# Patient Record
Sex: Female | Born: 1935
Health system: Southern US, Community
[De-identification: ages and names within clinical notes are randomized; demographics above are authoritative.]

## PROBLEM LIST (undated history)

## (undated) DIAGNOSIS — K5730 Diverticulosis of large intestine without perforation or abscess without bleeding: Secondary | ICD-10-CM

## (undated) DIAGNOSIS — I10 Essential (primary) hypertension: Secondary | ICD-10-CM

## (undated) DIAGNOSIS — B351 Tinea unguium: Secondary | ICD-10-CM

## (undated) DIAGNOSIS — K219 Gastro-esophageal reflux disease without esophagitis: Secondary | ICD-10-CM

## (undated) DIAGNOSIS — D259 Leiomyoma of uterus, unspecified: Secondary | ICD-10-CM

## (undated) DIAGNOSIS — M1990 Unspecified osteoarthritis, unspecified site: Secondary | ICD-10-CM

## (undated) DIAGNOSIS — C50919 Malignant neoplasm of unspecified site of unspecified female breast: Secondary | ICD-10-CM

## (undated) HISTORY — DX: Diverticulosis of large intestine without perforation or abscess without bleeding: K57.30

## (undated) HISTORY — PX: COLONOSCOPY: SHX174

## (undated) HISTORY — PX: ABDOMINAL HYSTERECTOMY: SHX81

## (undated) HISTORY — PX: VAGINAL HYSTERECTOMY: SHX2639

## (undated) HISTORY — DX: Leiomyoma of uterus, unspecified: D25.9

## (undated) HISTORY — DX: Tinea unguium: B35.1

## (undated) HISTORY — PX: CHOLECYSTECTOMY: SHX55

## (undated) HISTORY — DX: Gastro-esophageal reflux disease without esophagitis: K21.9

## (undated) HISTORY — DX: Malignant neoplasm of unspecified site of unspecified female breast: C50.919

---

## 1998-06-14 ENCOUNTER — Emergency Department (HOSPITAL_COMMUNITY): Admission: EM | Admit: 1998-06-14 | Discharge: 1998-06-15 | Payer: Self-pay | Admitting: Emergency Medicine

## 2002-12-07 HISTORY — PX: OTHER SURGICAL HISTORY: SHX169

## 2002-12-07 HISTORY — PX: BREAST LUMPECTOMY: SHX2

## 2003-02-22 ENCOUNTER — Encounter: Admission: RE | Admit: 2003-02-22 | Discharge: 2003-02-22 | Payer: Self-pay | Admitting: Family Medicine

## 2003-02-22 ENCOUNTER — Other Ambulatory Visit: Admission: RE | Admit: 2003-02-22 | Discharge: 2003-02-22 | Payer: Self-pay | Admitting: Radiology

## 2003-02-22 ENCOUNTER — Encounter: Payer: Self-pay | Admitting: Family Medicine

## 2003-03-09 ENCOUNTER — Encounter (HOSPITAL_BASED_OUTPATIENT_CLINIC_OR_DEPARTMENT_OTHER): Payer: Self-pay | Admitting: General Surgery

## 2003-03-13 ENCOUNTER — Ambulatory Visit (HOSPITAL_COMMUNITY): Admission: RE | Admit: 2003-03-13 | Discharge: 2003-03-13 | Payer: Self-pay | Admitting: General Surgery

## 2003-03-13 ENCOUNTER — Encounter (INDEPENDENT_AMBULATORY_CARE_PROVIDER_SITE_OTHER): Payer: Self-pay | Admitting: Specialist

## 2003-03-13 ENCOUNTER — Encounter (HOSPITAL_BASED_OUTPATIENT_CLINIC_OR_DEPARTMENT_OTHER): Payer: Self-pay | Admitting: General Surgery

## 2003-04-05 ENCOUNTER — Ambulatory Visit (HOSPITAL_COMMUNITY): Admission: RE | Admit: 2003-04-05 | Discharge: 2003-04-05 | Payer: Self-pay | Admitting: Oncology

## 2003-04-05 ENCOUNTER — Encounter (HOSPITAL_COMMUNITY): Payer: Self-pay | Admitting: Oncology

## 2003-04-06 ENCOUNTER — Encounter: Payer: Self-pay | Admitting: Cardiovascular Disease

## 2003-04-06 ENCOUNTER — Ambulatory Visit: Admission: RE | Admit: 2003-04-06 | Discharge: 2003-04-06 | Payer: Self-pay | Admitting: Oncology

## 2003-04-16 ENCOUNTER — Ambulatory Visit (HOSPITAL_COMMUNITY): Admission: RE | Admit: 2003-04-16 | Discharge: 2003-04-16 | Payer: Self-pay | Admitting: Oncology

## 2003-04-16 ENCOUNTER — Encounter (HOSPITAL_COMMUNITY): Payer: Self-pay | Admitting: Oncology

## 2003-04-30 ENCOUNTER — Other Ambulatory Visit: Admission: RE | Admit: 2003-04-30 | Discharge: 2003-04-30 | Payer: Self-pay | Admitting: Gynecology

## 2003-06-06 ENCOUNTER — Ambulatory Visit: Admission: RE | Admit: 2003-06-06 | Discharge: 2003-06-06 | Payer: Self-pay | Admitting: Gynecology

## 2003-10-31 ENCOUNTER — Ambulatory Visit: Admission: RE | Admit: 2003-10-31 | Discharge: 2004-01-17 | Payer: Self-pay | Admitting: Radiation Oncology

## 2003-11-08 ENCOUNTER — Encounter: Admission: RE | Admit: 2003-11-08 | Discharge: 2003-11-08 | Payer: Self-pay | Admitting: Radiation Oncology

## 2003-11-19 ENCOUNTER — Ambulatory Visit (HOSPITAL_COMMUNITY): Admission: RE | Admit: 2003-11-19 | Discharge: 2003-11-19 | Payer: Self-pay | Admitting: Gynecology

## 2003-11-19 ENCOUNTER — Other Ambulatory Visit: Admission: RE | Admit: 2003-11-19 | Discharge: 2003-11-19 | Payer: Self-pay | Admitting: Gynecology

## 2003-11-27 ENCOUNTER — Ambulatory Visit (HOSPITAL_COMMUNITY): Admission: RE | Admit: 2003-11-27 | Discharge: 2003-11-27 | Payer: Self-pay | Admitting: Oncology

## 2003-12-26 ENCOUNTER — Ambulatory Visit (HOSPITAL_COMMUNITY): Admission: RE | Admit: 2003-12-26 | Discharge: 2003-12-26 | Payer: Self-pay | Admitting: Oncology

## 2004-02-04 ENCOUNTER — Ambulatory Visit (HOSPITAL_COMMUNITY): Admission: RE | Admit: 2004-02-04 | Discharge: 2004-02-04 | Payer: Self-pay | Admitting: Oncology

## 2004-02-07 ENCOUNTER — Ambulatory Visit: Admission: RE | Admit: 2004-02-07 | Discharge: 2004-02-07 | Payer: Self-pay | Admitting: Radiation Oncology

## 2004-02-21 ENCOUNTER — Ambulatory Visit: Admission: RE | Admit: 2004-02-21 | Discharge: 2004-02-21 | Payer: Self-pay | Admitting: Radiation Oncology

## 2004-02-25 ENCOUNTER — Encounter: Admission: RE | Admit: 2004-02-25 | Discharge: 2004-02-25 | Payer: Self-pay | Admitting: Oncology

## 2004-08-04 ENCOUNTER — Other Ambulatory Visit: Admission: RE | Admit: 2004-08-04 | Discharge: 2004-08-04 | Payer: Self-pay | Admitting: Gynecology

## 2004-08-28 ENCOUNTER — Ambulatory Visit: Admission: RE | Admit: 2004-08-28 | Discharge: 2004-08-28 | Payer: Self-pay | Admitting: Radiation Oncology

## 2004-10-20 ENCOUNTER — Ambulatory Visit: Payer: Self-pay | Admitting: Oncology

## 2004-10-21 ENCOUNTER — Ambulatory Visit (HOSPITAL_COMMUNITY): Admission: RE | Admit: 2004-10-21 | Discharge: 2004-10-21 | Payer: Self-pay | Admitting: Oncology

## 2005-01-19 ENCOUNTER — Ambulatory Visit: Payer: Self-pay | Admitting: Oncology

## 2005-02-10 ENCOUNTER — Ambulatory Visit (HOSPITAL_COMMUNITY): Admission: RE | Admit: 2005-02-10 | Discharge: 2005-02-10 | Payer: Self-pay | Admitting: Gynecology

## 2005-04-17 ENCOUNTER — Ambulatory Visit: Payer: Self-pay | Admitting: Oncology

## 2005-04-23 ENCOUNTER — Encounter: Admission: RE | Admit: 2005-04-23 | Discharge: 2005-04-23 | Payer: Self-pay | Admitting: Oncology

## 2005-08-03 ENCOUNTER — Ambulatory Visit: Payer: Self-pay | Admitting: Oncology

## 2005-08-13 ENCOUNTER — Ambulatory Visit: Payer: Self-pay | Admitting: Internal Medicine

## 2005-08-24 ENCOUNTER — Ambulatory Visit (HOSPITAL_COMMUNITY): Admission: RE | Admit: 2005-08-24 | Discharge: 2005-08-24 | Payer: Self-pay | Admitting: Internal Medicine

## 2005-08-24 ENCOUNTER — Ambulatory Visit: Payer: Self-pay | Admitting: Internal Medicine

## 2005-11-03 ENCOUNTER — Ambulatory Visit (HOSPITAL_COMMUNITY): Admission: RE | Admit: 2005-11-03 | Discharge: 2005-11-03 | Payer: Self-pay | Admitting: Oncology

## 2005-11-03 ENCOUNTER — Ambulatory Visit: Payer: Self-pay | Admitting: Oncology

## 2005-11-19 ENCOUNTER — Encounter: Admission: RE | Admit: 2005-11-19 | Discharge: 2005-11-19 | Payer: Self-pay | Admitting: Oncology

## 2006-02-03 ENCOUNTER — Ambulatory Visit: Payer: Self-pay | Admitting: Oncology

## 2006-05-01 ENCOUNTER — Ambulatory Visit: Payer: Self-pay | Admitting: Oncology

## 2006-05-06 LAB — COMPREHENSIVE METABOLIC PANEL WITH GFR
ALT: <8 U/L (ref 0–40)
AST: 11 U/L (ref 0–37)
Albumin: 3.9 g/dL (ref 3.5–5.2)
Alkaline Phosphatase: 77 U/L (ref 39–117)
BUN: 14 mg/dL (ref 6–23)
CO2: 24 meq/L (ref 19–32)
Calcium: 9.4 mg/dL (ref 8.4–10.5)
Chloride: 106 meq/L (ref 96–112)
Creatinine, Ser: 1 mg/dL (ref 0.4–1.2)
Glucose, Bld: 93 mg/dL (ref 70–99)
Potassium: 4.3 meq/L (ref 3.5–5.3)
Sodium: 140 meq/L (ref 135–145)
Total Bilirubin: 0.4 mg/dL (ref 0.3–1.2)
Total Protein: 7.2 g/dL (ref 6.0–8.3)

## 2006-05-06 LAB — CBC WITH DIFFERENTIAL/PLATELET
BASO%: 0.2 % (ref 0.0–2.0)
Basophils Absolute: 0 10e3/uL (ref 0.0–0.1)
EOS%: 2.5 % (ref 0.0–7.0)
Eosinophils Absolute: 0.2 10e3/uL (ref 0.0–0.5)
HCT: 42.2 % (ref 34.8–46.6)
HGB: 14.1 g/dL (ref 11.6–15.9)
LYMPH%: 29.8 % (ref 14.0–48.0)
MCH: 28.3 pg (ref 26.0–34.0)
MCHC: 33.3 g/dL (ref 32.0–36.0)
MCV: 84.8 fL (ref 81.0–101.0)
MONO#: 0.6 10e3/uL (ref 0.1–0.9)
MONO%: 9.3 % (ref 0.0–13.0)
NEUT#: 3.9 10e3/uL (ref 1.5–6.5)
NEUT%: 58.2 % (ref 39.6–76.8)
Platelets: 181 10e3/uL (ref 145–400)
RBC: 4.97 10e6/uL (ref 3.70–5.32)
RDW: 13.6 % (ref 11.3–14.5)
WBC: 6.8 10e3/uL (ref 3.9–10.0)
lymph#: 2 10e3/uL (ref 0.9–3.3)

## 2006-05-06 LAB — LACTATE DEHYDROGENASE: LDH: 154 U/L (ref 94–250)

## 2006-05-06 LAB — TSH: TSH: 0.958 u[IU]/mL (ref 0.350–5.500)

## 2006-05-20 ENCOUNTER — Encounter: Admission: RE | Admit: 2006-05-20 | Discharge: 2006-05-20 | Payer: Self-pay | Admitting: Oncology

## 2006-08-03 ENCOUNTER — Ambulatory Visit: Payer: Self-pay | Admitting: Oncology

## 2006-08-05 LAB — COMPREHENSIVE METABOLIC PANEL
Albumin: 4.4 g/dL (ref 3.5–5.2)
Alkaline Phosphatase: 91 U/L (ref 39–117)
CO2: 22 mEq/L (ref 19–32)
Chloride: 104 mEq/L (ref 96–112)
Glucose, Bld: 98 mg/dL (ref 70–99)
Potassium: 4.5 mEq/L (ref 3.5–5.3)
Sodium: 137 mEq/L (ref 135–145)
Total Protein: 8 g/dL (ref 6.0–8.3)

## 2006-08-05 LAB — CBC WITH DIFFERENTIAL/PLATELET
Eosinophils Absolute: 0.1 10*3/uL (ref 0.0–0.5)
MONO#: 0.6 10*3/uL (ref 0.1–0.9)
MONO%: 7.4 % (ref 0.0–13.0)
NEUT#: 5.5 10*3/uL (ref 1.5–6.5)
RBC: 5.4 10*6/uL — ABNORMAL HIGH (ref 3.70–5.32)
RDW: 13.2 % (ref 11.3–14.5)
WBC: 8.6 10*3/uL (ref 3.9–10.0)
lymph#: 2.3 10*3/uL (ref 0.9–3.3)

## 2006-08-05 LAB — LACTATE DEHYDROGENASE: LDH: 139 U/L (ref 94–250)

## 2006-08-12 ENCOUNTER — Emergency Department (HOSPITAL_COMMUNITY): Admission: EM | Admit: 2006-08-12 | Discharge: 2006-08-12 | Payer: Self-pay | Admitting: Emergency Medicine

## 2006-11-02 ENCOUNTER — Ambulatory Visit: Payer: Self-pay | Admitting: Oncology

## 2006-11-04 ENCOUNTER — Ambulatory Visit (HOSPITAL_COMMUNITY): Admission: RE | Admit: 2006-11-04 | Discharge: 2006-11-04 | Payer: Self-pay | Admitting: Oncology

## 2006-11-04 LAB — CBC WITH DIFFERENTIAL/PLATELET
Basophils Absolute: 0 10*3/uL (ref 0.0–0.1)
Eosinophils Absolute: 0.1 10*3/uL (ref 0.0–0.5)
HCT: 41 % (ref 34.8–46.6)
HGB: 13.6 g/dL (ref 11.6–15.9)
MCV: 85.6 fL (ref 81.0–101.0)
MONO%: 7 % (ref 0.0–13.0)
NEUT#: 3.7 10*3/uL (ref 1.5–6.5)
NEUT%: 60.8 % (ref 39.6–76.8)
RDW: 14.4 % (ref 11.3–14.5)
lymph#: 1.8 10*3/uL (ref 0.9–3.3)

## 2006-11-04 LAB — COMPREHENSIVE METABOLIC PANEL
Albumin: 3.9 g/dL (ref 3.5–5.2)
BUN: 17 mg/dL (ref 6–23)
Calcium: 9 mg/dL (ref 8.4–10.5)
Chloride: 109 mEq/L (ref 96–112)
Creatinine, Ser: 0.9 mg/dL (ref 0.40–1.20)
Glucose, Bld: 87 mg/dL (ref 70–99)
Potassium: 4.3 mEq/L (ref 3.5–5.3)

## 2006-11-04 LAB — LACTATE DEHYDROGENASE: LDH: 141 U/L (ref 94–250)

## 2007-01-31 ENCOUNTER — Ambulatory Visit: Payer: Self-pay | Admitting: Oncology

## 2007-04-21 ENCOUNTER — Emergency Department (HOSPITAL_COMMUNITY): Admission: EM | Admit: 2007-04-21 | Discharge: 2007-04-21 | Payer: Self-pay | Admitting: Emergency Medicine

## 2007-05-11 ENCOUNTER — Ambulatory Visit: Payer: Self-pay | Admitting: Oncology

## 2007-05-13 LAB — CBC WITH DIFFERENTIAL/PLATELET
BASO%: 0.7 % (ref 0.0–2.0)
Eosinophils Absolute: 0.2 10*3/uL (ref 0.0–0.5)
HCT: 41 % (ref 34.8–46.6)
HGB: 14 g/dL (ref 11.6–15.9)
MCH: 28.3 pg (ref 26.0–34.0)
MCV: 82.9 fL (ref 81.0–101.0)
MONO#: 0.6 10*3/uL (ref 0.1–0.9)
Platelets: 218 10*3/uL (ref 145–400)
RDW: 13.7 % (ref 11.3–14.5)

## 2007-05-13 LAB — COMPREHENSIVE METABOLIC PANEL
ALT: <8 U/L (ref 0–35)
AST: 12 U/L (ref 0–37)
Albumin: 3.9 g/dL (ref 3.5–5.2)
Alkaline Phosphatase: 110 U/L (ref 39–117)
BUN: 12 mg/dL (ref 6–23)
CO2: 23 mEq/L (ref 19–32)
Calcium: 9.4 mg/dL (ref 8.4–10.5)
Chloride: 108 mEq/L (ref 96–112)
Creatinine, Ser: 0.89 mg/dL (ref 0.40–1.20)
Glucose, Bld: 88 mg/dL (ref 70–99)
Potassium: 4.1 mEq/L (ref 3.5–5.3)
Sodium: 141 mEq/L (ref 135–145)
Total Bilirubin: 0.4 mg/dL (ref 0.3–1.2)
Total Protein: 7.3 g/dL (ref 6.0–8.3)

## 2007-05-27 ENCOUNTER — Encounter: Admission: RE | Admit: 2007-05-27 | Discharge: 2007-05-27 | Payer: Self-pay | Admitting: Family Medicine

## 2007-06-03 ENCOUNTER — Encounter: Admission: RE | Admit: 2007-06-03 | Discharge: 2007-06-03 | Payer: Self-pay | Admitting: Oncology

## 2007-09-07 ENCOUNTER — Ambulatory Visit: Payer: Self-pay | Admitting: Oncology

## 2007-09-26 LAB — COMPREHENSIVE METABOLIC PANEL
AST: 10 U/L (ref 0–37)
Albumin: 4 g/dL (ref 3.5–5.2)
Alkaline Phosphatase: 76 U/L (ref 39–117)
BUN: 14 mg/dL (ref 6–23)
Potassium: 4 mEq/L (ref 3.5–5.3)
Total Bilirubin: 0.4 mg/dL (ref 0.3–1.2)

## 2007-09-26 LAB — CBC WITH DIFFERENTIAL/PLATELET
Basophils Absolute: 0.1 10*3/uL (ref 0.0–0.1)
EOS%: 1.7 % (ref 0.0–7.0)
HGB: 14 g/dL (ref 11.6–15.9)
LYMPH%: 33.1 % (ref 14.0–48.0)
MCH: 28.2 pg (ref 26.0–34.0)
MCV: 82.3 fL (ref 81.0–101.0)
MONO%: 7.1 % (ref 0.0–13.0)
Platelets: 176 10*3/uL (ref 145–400)
RBC: 4.97 10*6/uL (ref 3.70–5.32)
RDW: 14 % (ref 11.3–14.5)

## 2008-01-25 ENCOUNTER — Ambulatory Visit: Payer: Self-pay | Admitting: Oncology

## 2008-01-30 ENCOUNTER — Emergency Department (HOSPITAL_COMMUNITY): Admission: EM | Admit: 2008-01-30 | Discharge: 2008-01-31 | Payer: Self-pay | Admitting: Family Medicine

## 2008-02-10 ENCOUNTER — Ambulatory Visit (HOSPITAL_COMMUNITY): Admission: RE | Admit: 2008-02-10 | Discharge: 2008-02-10 | Payer: Self-pay | Admitting: Gynecology

## 2008-02-20 ENCOUNTER — Ambulatory Visit: Payer: Self-pay | Admitting: Gastroenterology

## 2008-04-23 DIAGNOSIS — C50919 Malignant neoplasm of unspecified site of unspecified female breast: Secondary | ICD-10-CM | POA: Insufficient documentation

## 2008-04-23 DIAGNOSIS — K5909 Other constipation: Secondary | ICD-10-CM

## 2008-04-23 DIAGNOSIS — K5730 Diverticulosis of large intestine without perforation or abscess without bleeding: Secondary | ICD-10-CM | POA: Insufficient documentation

## 2008-04-23 DIAGNOSIS — D259 Leiomyoma of uterus, unspecified: Secondary | ICD-10-CM | POA: Insufficient documentation

## 2008-04-23 DIAGNOSIS — I10 Essential (primary) hypertension: Secondary | ICD-10-CM

## 2008-04-23 HISTORY — DX: Essential (primary) hypertension: I10

## 2008-04-23 HISTORY — DX: Malignant neoplasm of unspecified site of unspecified female breast: C50.919

## 2008-04-23 HISTORY — DX: Other constipation: K59.09

## 2008-04-24 ENCOUNTER — Ambulatory Visit: Payer: Self-pay | Admitting: Internal Medicine

## 2008-04-25 ENCOUNTER — Ambulatory Visit: Payer: Self-pay | Admitting: Oncology

## 2008-04-27 ENCOUNTER — Ambulatory Visit (HOSPITAL_COMMUNITY): Admission: RE | Admit: 2008-04-27 | Discharge: 2008-04-27 | Payer: Self-pay | Admitting: Oncology

## 2008-05-31 ENCOUNTER — Encounter: Admission: RE | Admit: 2008-05-31 | Discharge: 2008-05-31 | Payer: Self-pay | Admitting: Oncology

## 2008-10-25 ENCOUNTER — Ambulatory Visit: Payer: Self-pay | Admitting: Oncology

## 2008-10-29 LAB — COMPREHENSIVE METABOLIC PANEL
Albumin: 3.9 g/dL (ref 3.5–5.2)
CO2: 22 mEq/L (ref 19–32)
Glucose, Bld: 99 mg/dL (ref 70–99)
Sodium: 143 mEq/L (ref 135–145)
Total Bilirubin: 0.4 mg/dL (ref 0.3–1.2)
Total Protein: 7.3 g/dL (ref 6.0–8.3)

## 2008-10-29 LAB — CBC WITH DIFFERENTIAL/PLATELET
Eosinophils Absolute: 0.2 10*3/uL (ref 0.0–0.5)
HCT: 44.3 % (ref 34.8–46.6)
HGB: 14.8 g/dL (ref 11.6–15.9)
LYMPH%: 30.7 % (ref 14.0–48.0)
MONO#: 0.6 10*3/uL (ref 0.1–0.9)
NEUT#: 4.4 10*3/uL (ref 1.5–6.5)
NEUT%: 58.1 % (ref 39.6–76.8)
Platelets: 174 10*3/uL (ref 145–400)
RBC: 5.37 10*6/uL — ABNORMAL HIGH (ref 3.70–5.32)
WBC: 7.6 10*3/uL (ref 3.9–10.0)

## 2008-10-29 LAB — LACTATE DEHYDROGENASE: LDH: 209 U/L (ref 94–250)

## 2009-05-23 ENCOUNTER — Ambulatory Visit: Payer: Self-pay | Admitting: Oncology

## 2009-05-27 ENCOUNTER — Ambulatory Visit (HOSPITAL_COMMUNITY): Admission: RE | Admit: 2009-05-27 | Discharge: 2009-05-27 | Payer: Self-pay | Admitting: Oncology

## 2009-05-27 LAB — CBC WITH DIFFERENTIAL/PLATELET
Basophils Absolute: 0 10*3/uL (ref 0.0–0.1)
EOS%: 1.8 % (ref 0.0–7.0)
HCT: 43.8 % (ref 34.8–46.6)
HGB: 14.8 g/dL (ref 11.6–15.9)
MCH: 28.1 pg (ref 25.1–34.0)
MCHC: 33.8 g/dL (ref 31.5–36.0)
MCV: 83.1 fL (ref 79.5–101.0)
MONO%: 8.6 % (ref 0.0–14.0)
NEUT%: 49.3 % (ref 38.4–76.8)

## 2009-05-27 LAB — COMPREHENSIVE METABOLIC PANEL
Alkaline Phosphatase: 109 U/L (ref 39–117)
BUN: 16 mg/dL (ref 6–23)
Creatinine, Ser: 0.96 mg/dL (ref 0.40–1.20)
Glucose, Bld: 84 mg/dL (ref 70–99)
Total Bilirubin: 0.3 mg/dL (ref 0.3–1.2)

## 2009-06-03 ENCOUNTER — Encounter: Admission: RE | Admit: 2009-06-03 | Discharge: 2009-06-03 | Payer: Self-pay | Admitting: Oncology

## 2009-06-04 ENCOUNTER — Encounter: Admission: RE | Admit: 2009-06-04 | Discharge: 2009-06-04 | Payer: Self-pay | Admitting: Oncology

## 2009-10-17 ENCOUNTER — Ambulatory Visit: Payer: Self-pay | Admitting: Oncology

## 2009-10-21 LAB — CBC WITH DIFFERENTIAL/PLATELET
BASO%: 0.4 % (ref 0.0–2.0)
Eosinophils Absolute: 0.2 10*3/uL (ref 0.0–0.5)
HCT: 46.4 % (ref 34.8–46.6)
MCHC: 33 g/dL (ref 31.5–36.0)
MCV: 84.8 fL (ref 79.5–101.0)
MONO%: 7.2 % (ref 0.0–14.0)
RBC: 5.47 10*6/uL — ABNORMAL HIGH (ref 3.70–5.45)
lymph#: 2.8 10*3/uL (ref 0.9–3.3)

## 2009-10-21 LAB — COMPREHENSIVE METABOLIC PANEL
ALT: <8 U/L (ref 0–35)
AST: 11 U/L (ref 0–37)
CO2: 25 mEq/L (ref 19–32)
Chloride: 104 mEq/L (ref 96–112)
Creatinine, Ser: 0.89 mg/dL (ref 0.40–1.20)
Potassium: 4.1 mEq/L (ref 3.5–5.3)
Total Bilirubin: 0.4 mg/dL (ref 0.3–1.2)
Total Protein: 7.4 g/dL (ref 6.0–8.3)

## 2010-04-18 ENCOUNTER — Ambulatory Visit: Payer: Self-pay | Admitting: Oncology

## 2010-04-21 ENCOUNTER — Ambulatory Visit (HOSPITAL_COMMUNITY): Admission: RE | Admit: 2010-04-21 | Discharge: 2010-04-21 | Payer: Self-pay | Admitting: Oncology

## 2010-04-21 LAB — CBC WITH DIFFERENTIAL/PLATELET
EOS%: 1.8 % (ref 0.0–7.0)
HCT: 44 % (ref 34.8–46.6)
LYMPH%: 24.6 % (ref 14.0–49.7)
MCHC: 33.7 g/dL (ref 31.5–36.0)
MONO%: 6.8 % (ref 0.0–14.0)
NEUT#: 5.7 10*3/uL (ref 1.5–6.5)
NEUT%: 66.7 % (ref 38.4–76.8)
RBC: 5.2 10*6/uL (ref 3.70–5.45)
RDW: 13.8 % (ref 11.2–14.5)
WBC: 8.6 10*3/uL (ref 3.9–10.3)
lymph#: 2.1 10*3/uL (ref 0.9–3.3)

## 2010-04-21 LAB — COMPREHENSIVE METABOLIC PANEL
Albumin: 4.1 g/dL (ref 3.5–5.2)
Chloride: 106 mEq/L (ref 96–112)
Creatinine, Ser: 1.03 mg/dL (ref 0.40–1.20)
Total Protein: 7.4 g/dL (ref 6.0–8.3)

## 2010-04-21 LAB — LACTATE DEHYDROGENASE: LDH: 147 U/L (ref 94–250)

## 2010-06-18 ENCOUNTER — Encounter: Admission: RE | Admit: 2010-06-18 | Discharge: 2010-06-18 | Payer: Self-pay | Admitting: Oncology

## 2010-10-17 ENCOUNTER — Ambulatory Visit: Payer: Self-pay | Admitting: Oncology

## 2010-12-27 ENCOUNTER — Encounter (HOSPITAL_COMMUNITY): Payer: Self-pay | Admitting: Oncology

## 2011-04-21 NOTE — Assessment & Plan Note (Signed)
[facility]                         GASTROENTEROLOGY OFFICE NOTE   NAME:[name], [name]                       MRN:          [id number]  DATE:02/20/2008                            DOB:          Apr 12, 1936    Office GI consult.   PROBLEM:  Upper abdominal pain and constipation.   HISTORY:  Linda [name] is a pleasant 75 year old African American female known  to Dr. Juanda Chance from screening colonoscopy done in September 2006.  On  this exam, she was found to have left sided diverticulosis and otherwise  a normal colon.  The patient has a history of breast cancer diagnosed in  2005.  She has had a remote cholecystectomy and has a history of  hypertension.  She relates that she has had problems with constipation  over the past couple of years.  She had initially been taking stool  softeners but was requiring 4 per day.  She said this worked for a while  and then eventually just stopped helping.  Over the past month or so,  she has had increased difficulty with constipation and says when she  gets very constipated she starts having abdominal pain located in the  upper abdomen.  On further questioning, she is having bowel movements  fairly regularly but is passing small pellet like stools and feels that  she has incomplete evacuation.  She feels bloated and uncomfortable.  She is not having any severe abdominal pain.  No nausea or vomiting.  Her weight has been stable.   The patient did have a workup via the emergency room on January 30, 2008.  She did have a CT scan of the abdomen and pelvis which did not  show any acute findings.  She did have a tiny fat-containing umbilical  hernia and a 7.2-cm right adnexal cyst, otherwise negative exam.  She  has since had transvaginal ultrasound and the cyst is being evaluated by  gynecology.   CURRENT MEDICATIONS:  1. Tarka 4.24 daily.  2. Protonix 40 mg daily.  3. Triamterene/HCTZ 37.5/25 daily.  4. Arimidex 1 mg  daily.  5. Metoprolol 50 daily.   ALLERGIES:  No known drug allergies.   PAST HISTORY:  1. Again, pertinent for breast cancer diagnosed in 2005.  2. Remote cholecystectomy.  3. Hypertension.  4. Diverticulosis.  5. Uterine fibroids.   SOCIAL HISTORY:  The patient is single.  She does have two children.  She is a smoker, one pack per day.  No regular ETOH.   FAMILY HISTORY:  Negative for GI disease.   REVIEW OF SYSTEMS:  Completely negative other than GI as outlined above.   PHYSICAL EXAMINATION:  GENERAL:  A well developed African American  female in no acute distress.  VITAL SIGNS:  Weight is 169, height 5 feet 3 inches, blood pressure  118/60, pulse in the 70s.  HEENT:  Nontraumatic, normocephalic.  EOMI.  PERRLA.  Sclerae are  anicteric.  NECK:  Supple without nodes.  CARDIOVASCULAR:  Regular rate and rhythm with S1 and S2.  No murmur,  rub, or gallop.  PULMONARY:  Clear to A&P.  ABDOMEN:  Soft and basically nontender.  There is no palpable mass or  hepatosplenomegaly.  No guarding or rebound.  Bowel sounds are active.  RECTAL:  Hemoccult negative and without mass.   IMPRESSION:  5. A 75 year old female with chronic constipation with exacerbation      and associated abdominal pain.  2. History of breast cancer.  3. Uterine fibroids and large right adnexal cystic lesion.  4. Status post cholecystectomy.   PLAN:  1. Liberalize fluid intake.  2. MiraLax 17 G daily in 8 ounces of water.  3. Followup in one month or sooner p.r.n.      Mike Gip, PA-C  Electronically Signed      Hedwig Morton. Juanda Chance, MD  Electronically Signed   AE/MedQ  DD: 02/20/2008  DT: 02/20/2008  Job #: [id number]

## 2011-04-24 NOTE — Op Note (Signed)
NAME:  Linda [name], Linda [name]                            ACCOUNT NO.:  [id number]   MEDICAL RECORD NO.:  [id number]                   PATIENT TYPE:  OIB   LOCATION:  2550                                 FACILITY:  [facility]   PHYSICIAN:  Leonie Man, M.D.                DATE OF BIRTH:  September 25, 1936   DATE OF PROCEDURE:  03/13/2003  DATE OF DISCHARGE:  03/13/2003                                 OPERATIVE REPORT   PREOPERATIVE DIAGNOSIS:  Carcinoma right breast.   POSTOPERATIVE DIAGNOSIS:  Carcinoma right breast.   PROCEDURE:  Right lumpectomy with right sentinel lymph node dissection.   SURGEON:  Leonie Man, M.D.   ASSISTANT:  Magnus Ivan, R.N.F.A.   ANESTHESIA:  General.   INDICATIONS FOR PROCEDURE:  This patient is a 75 year old woman who is noted  to have a right-sided breast mass and subsequent mammogram showing a  spiculated lesion, which on core biopsy shows intraductal carcinoma. Further  evaluation shows this to be a ER positive lesion. The patient comes to the  operating room now for a lumpectomy with sentinel node dissection, possible  axillary lymph node dissection. She was aware of all the risks and potential  benefits of the surgery.   DESCRIPTION OF PROCEDURE:  Following the induction of satisfactory general  anesthesia the patient was  position supinely. The right breast was prepped  and draped to be included in the sterile operative field. It should be noted  that the patient was injected with both Lymphazurin and technetium sulfur  colloid prior to surgery for localization purposes.   The breast mass which was palpable in the 9 to 10 o'clock axis of the right  breast was approached. An elliptical incision was deepened through the skin  and subcutaneous tissues raising flaps superiorly and inferiorly and  carrying the dissection up and around the mass, then carrying the dissection  down to the chest wall. The mass was removed in its entirety for pathologic  evaluation.   Touch prep for margin on this specimen showed some atypia at the 3 o'clock  and the 12 o'clock margins. Additional margins were taken from these areas  and forwarded for pathologic evaluation and pathology is pending.   Attention was then turned to the axilla, where the Neoprobe was used to  localize a spot of high radioactive concentrate in the axilla. A transverse  axillary incision was made in that area and dissection was carried down to  the area of the lymph node using the Neoprobe to guide the direction of the  dissection. A large sentinel lymph node was found along with 2 additional  smaller nodes which were highly radioactive and stained blue. These were  removed in their entirety for further pathologic evaluation. All of the  nodes were noted to be negative for carcinoma on touch prep.   All areas of dissection were then checked for hemostasis and noted to  be  dry. The breast wound was closed in 2 layers using 3-0 Vicryl and a 4-0  Monocryl suture and the axillary wound using a 2-0 Vicryl and a 4-0 Monocryl  subcuticular suture. The wound was then reinforced with Steri-Strips and dry  sterile dressings were applied.   The anesthetic was reversed and the patient was removed from the operating  room to the recovery room in stable condition. She tolerated the procedure  well.                                               Leonie Man, M.D.    PB/MEDQ  D:  03/13/2003  T:  03/14/2003  Job:  [id number]   cc:   Lorelle Formosa, M.D.  [phone] [address]  [place]  [place] [address]  Fax: [phone]

## 2011-04-24 NOTE — Consult Note (Signed)
NAME:  Linda [name], CLOVER NO.:  [id number]   MEDICAL RECORD NO.:  [id number]                   PATIENT TYPE:  OUT   LOCATION:  GYN                                  FACILITY:  [facility]   PHYSICIAN:  De Blanch, M.D.         DATE OF BIRTH:  [birth year]   DATE OF CONSULTATION:  06/06/2003  DATE OF DISCHARGE:                                   CONSULTATION   REASON FOR CONSULTATION:  The patient is a 75 year old African-American  female seen in consultation at the request of Dr. Teodora Medici.   The patient has been recently diagnosed with a stage II breast cancer, and  is under the care of Dr. Johney Frame.  As part of her staging workup, she  had a CAT scan which showed a pelvic mass.  This was further typified with  transabdominal and transvaginal ultrasound showing the right ovary to  contain a large cyst measuring 5.7 x 3.9 x 6.2 cm.  The cyst is well defined  without evidence of calcifications or septations, and there is no free  fluid.  The patient is also found to have moderate sized uterine fibroids  measuring 7.8 x 7.4 x 7.1 cm.  The largest fibroid in the fundus and uterus  measures 4.4 x 5.2 x 5.2 cm.  The endometrial stripe is only 3.6 mm.   The patient denies any gynecologic symptoms.  She specifically denies any  pelvic pressure, pain, GI, or GU symptoms.  She has no significant past  gynecologic history, and was unaware that she had fibroids.  CA-125 has been  obtained which is 5.1 units/mL.   PAST MEDICAL HISTORY:  1. Breast cancer in 2004.  The patient is currently receiving multi-agent     chemotherapy.  2. Hypertension.  3. Goiter.  4. Gastroesophageal reflux disease.   PAST SURGICAL HISTORY:  Cholecystectomy.   OBSTETRICAL HISTORY:  Gravida 2.   ALLERGIES:  No known drug allergies.   CURRENT MEDICATIONS:  1. Triamterene/hydrochlorothiazide.  2. Tarka.  3. Multivitamins.  4. Aspirin.  5. Nexium.   FAMILY HISTORY:   Negative for gynecologic or breast cancer.  The patient's  grandmother has colon cancer.   REVIEW OF SYSTEMS:  Essentially negative, except as noted above.  She is  recovering from her second cycle of chemotherapy with minimal nausea and  fatigue.   PHYSICAL EXAMINATION:  VITAL SIGNS:  Weight 166 pounds, height 5 feet 3  inches, blood pressure 160/84, pulse 96, respiratory rate 24.  GENERAL:  The patient is a healthy African-American female in no acute  distress.  HEENT:  Alopecia.  NECK:  Supple without thyromegaly.  There is no supraclavicular or inguinal  adenopathy.  ABDOMEN:  Soft, nontender, no mass, organomegaly, ascites, or hernias are  noted.  PELVIC:  EGBUS, vagina, bladder, and urethra are normal, but atrophic.  Cervix is normal.  Uterus is irregular in size, approximately eight weeks.  This is consistent with uterine fibroids.  I do not feel the adnexal mass  noted on ultrasound.  Rectovaginal examination confirms.  EXTREMITIES:  Lower extremities are without edema or varicosities.   IMPRESSION:  Simple ovarian cyst with normal CA-125.   I had a good discussion with the patient and her daughter regarding the  differential diagnoses.  Given the ultrasound architectural characteristics,  I believe this is most likely a benign cyst, and is likely to be of no  consequent.  The outside chance that it would change, I suggest that she  have a repeat ultrasound done in the same unit where she had previously had  an ultrasound [facility]) in approximately four months.  Provided this does not change in characteristics, I would suggest that we  just follow it at six month intervals for a year and then annually.  We will  return the patient to the care of Dr. Chevis Pretty for continuing gynecologic  followup, but I would be happy to see her if there is any worrisome changes.                                               De Blanch, M.D.    DC/MEDQ  D:   06/06/2003  T:  06/06/2003  Job:  [id number]   cc:   Samul Dada, M.D.  [address] [facility]  [place]  [place] [address]  Fax: [phone]   Leatha Gilding. Mezer, M.D.  [address]  [place]  [place] [address]  Fax: [phone]   Telford Nab, R.N.  [phone] [address]  [place], [place] [address]

## 2011-06-15 ENCOUNTER — Other Ambulatory Visit (HOSPITAL_COMMUNITY): Payer: Self-pay | Admitting: Oncology

## 2011-06-15 DIAGNOSIS — Z853 Personal history of malignant neoplasm of breast: Secondary | ICD-10-CM

## 2011-06-15 DIAGNOSIS — Z9889 Other specified postprocedural states: Secondary | ICD-10-CM

## 2011-06-18 ENCOUNTER — Ambulatory Visit
Admission: RE | Admit: 2011-06-18 | Discharge: 2011-06-18 | Disposition: A | Discharge status: Home / Self Care | Payer: PRIVATE HEALTH INSURANCE | Source: Ambulatory Visit | Attending: Oncology | Admitting: Oncology

## 2011-06-18 ENCOUNTER — Other Ambulatory Visit (HOSPITAL_COMMUNITY): Payer: Self-pay | Admitting: Oncology

## 2011-06-18 DIAGNOSIS — Z9889 Other specified postprocedural states: Secondary | ICD-10-CM

## 2011-06-18 DIAGNOSIS — Z853 Personal history of malignant neoplasm of breast: Secondary | ICD-10-CM

## 2011-08-28 LAB — URINE CULTURE
Colony Count: NO GROWTH
Culture: NO GROWTH

## 2011-08-28 LAB — BASIC METABOLIC PANEL
BUN: 14
CO2: 26
Calcium: 10
Chloride: 105
Creatinine, Ser: 1.01
GFR calc Af Amer: >60
GFR calc non Af Amer: 54 — ABNORMAL LOW
Glucose, Bld: 83
Potassium: 4.6
Sodium: 140

## 2011-08-28 LAB — URINALYSIS, ROUTINE W REFLEX MICROSCOPIC
Glucose, UA: NEGATIVE
Hgb urine dipstick: NEGATIVE
Ketones, ur: 15 — AB
Nitrite: NEGATIVE
Protein, ur: 100 — AB
Specific Gravity, Urine: 1.025
Urobilinogen, UA: 1
pH: 5

## 2011-08-28 LAB — POCT URINALYSIS DIP (DEVICE)
Ketones, ur: 15 — AB
Protein, ur: 100 — AB
Specific Gravity, Urine: >=1.03
pH: 5.5

## 2011-08-28 LAB — CBC
HCT: 47.1 — ABNORMAL HIGH
Hemoglobin: 15.6 — ABNORMAL HIGH
MCHC: 33.2
MCV: 83.2
Platelets: 169
RBC: 5.66 — ABNORMAL HIGH
RDW: 13.3
WBC: 10.5

## 2011-08-28 LAB — HEPATIC FUNCTION PANEL
Albumin: 4
Indirect Bilirubin: 0.7
Total Bilirubin: 0.8
Total Protein: 8.2

## 2011-08-28 LAB — OCCULT BLOOD X 1 CARD TO LAB, STOOL: Fecal Occult Bld: NEGATIVE

## 2011-08-28 LAB — DIFFERENTIAL
Basophils Absolute: 0
Basophils Relative: 0
Eosinophils Absolute: 0.1
Eosinophils Relative: 1
Lymphocytes Relative: 29
Lymphs Abs: 3.1
Monocytes Absolute: 0.6
Monocytes Relative: 6
Neutro Abs: 6.6
Neutrophils Relative %: 63

## 2011-08-28 LAB — URINE MICROSCOPIC-ADD ON

## 2011-08-28 LAB — LIPASE, BLOOD: Lipase: 71 — ABNORMAL HIGH

## 2011-12-10 ENCOUNTER — Emergency Department (HOSPITAL_COMMUNITY): Payer: PRIVATE HEALTH INSURANCE

## 2011-12-10 ENCOUNTER — Emergency Department (HOSPITAL_COMMUNITY)
Admission: EM | Admit: 2011-12-10 | Discharge: 2011-12-10 | Disposition: A | Discharge status: Home / Self Care | Payer: PRIVATE HEALTH INSURANCE | Attending: Emergency Medicine | Admitting: Emergency Medicine

## 2011-12-10 DIAGNOSIS — K429 Umbilical hernia without obstruction or gangrene: Secondary | ICD-10-CM | POA: Insufficient documentation

## 2011-12-10 DIAGNOSIS — Q619 Cystic kidney disease, unspecified: Secondary | ICD-10-CM | POA: Insufficient documentation

## 2011-12-10 DIAGNOSIS — Z853 Personal history of malignant neoplasm of breast: Secondary | ICD-10-CM | POA: Insufficient documentation

## 2011-12-10 DIAGNOSIS — Z9089 Acquired absence of other organs: Secondary | ICD-10-CM | POA: Insufficient documentation

## 2011-12-10 DIAGNOSIS — K5730 Diverticulosis of large intestine without perforation or abscess without bleeding: Secondary | ICD-10-CM | POA: Insufficient documentation

## 2011-12-10 DIAGNOSIS — I10 Essential (primary) hypertension: Secondary | ICD-10-CM | POA: Insufficient documentation

## 2011-12-10 DIAGNOSIS — K5909 Other constipation: Secondary | ICD-10-CM | POA: Insufficient documentation

## 2011-12-10 DIAGNOSIS — Z90710 Acquired absence of both cervix and uterus: Secondary | ICD-10-CM | POA: Insufficient documentation

## 2011-12-10 DIAGNOSIS — R109 Unspecified abdominal pain: Secondary | ICD-10-CM

## 2011-12-10 HISTORY — DX: Unspecified osteoarthritis, unspecified site: M19.90

## 2011-12-10 HISTORY — DX: Essential (primary) hypertension: I10

## 2011-12-10 LAB — URINE MICROSCOPIC-ADD ON

## 2011-12-10 LAB — URINALYSIS, ROUTINE W REFLEX MICROSCOPIC
Glucose, UA: NEGATIVE mg/dL
Hgb urine dipstick: NEGATIVE
Leukocytes, UA: NEGATIVE
Protein, ur: 30 mg/dL — AB
Specific Gravity, Urine: 1.022 (ref 1.005–1.030)
Urobilinogen, UA: 1 mg/dL (ref 0.0–1.0)

## 2011-12-10 LAB — CBC
HCT: 43.9 % (ref 36.0–46.0)
MCH: 28.1 pg (ref 26.0–34.0)
MCHC: 33.9 g/dL (ref 30.0–36.0)
MCV: 82.8 fL (ref 78.0–100.0)
Platelets: 160 10*3/uL (ref 150–400)
RDW: 13.2 % (ref 11.5–15.5)

## 2011-12-10 LAB — COMPREHENSIVE METABOLIC PANEL
ALT: 13 U/L (ref 0–35)
AST: 14 U/L (ref 0–37)
CO2: 24 mEq/L (ref 19–32)
Calcium: 9.6 mg/dL (ref 8.4–10.5)
Creatinine, Ser: 0.92 mg/dL (ref 0.50–1.10)
GFR calc Af Amer: 70 mL/min — ABNORMAL LOW (ref >90)
GFR calc non Af Amer: 60 mL/min — ABNORMAL LOW (ref >90)
Sodium: 137 mEq/L (ref 135–145)
Total Protein: 7.8 g/dL (ref 6.0–8.3)

## 2011-12-10 LAB — DIFFERENTIAL
Basophils Absolute: 0.1 10*3/uL (ref 0.0–0.1)
Eosinophils Absolute: 0.2 10*3/uL (ref 0.0–0.7)
Eosinophils Relative: 2 % (ref 0–5)
Lymphocytes Relative: 31 % (ref 12–46)
Monocytes Absolute: 0.7 10*3/uL (ref 0.1–1.0)

## 2011-12-10 MED ORDER — SODIUM CHLORIDE 0.9 % IV BOLUS (SEPSIS)
1000.0000 mL | Freq: Once | INTRAVENOUS | Status: AC
  Administered 2011-12-10: 1000 mL via INTRAVENOUS

## 2011-12-10 MED ORDER — POLYETHYLENE GLYCOL 3350 17 GM/SCOOP PO POWD: 17.0000 g | Freq: Two times a day (BID) | ORAL | Status: AC

## 2011-12-10 MED ORDER — OMEPRAZOLE 20 MG PO CPDR: 20.0000 mg | DELAYED_RELEASE_CAPSULE | Freq: Every day | ORAL | Status: DC

## 2011-12-10 MED ORDER — IOHEXOL 300 MG/ML  SOLN
100.0000 mL | Freq: Once | INTRAMUSCULAR | Status: AC | PRN
  Administered 2011-12-10: 100 mL via INTRAVENOUS

## 2011-12-10 MED ORDER — MORPHINE SULFATE 2 MG/ML IJ SOLN
2.0000 mg | Freq: Once | INTRAMUSCULAR | Status: AC
  Administered 2011-12-10: 2 mg via INTRAVENOUS
  Filled 2011-12-10: qty 1

## 2011-12-10 MED ORDER — BISACODYL 10 MG RE SUPP
10.0000 mg | Freq: Once | RECTAL | Status: AC
  Administered 2011-12-10: 10 mg via RECTAL
  Filled 2011-12-10: qty 1

## 2011-12-10 NOTE — ED Notes (Signed)
Pt up to bathroom, unable to go.

## 2011-12-10 NOTE — ED Notes (Signed)
Pt to CT

## 2011-12-10 NOTE — ED Notes (Signed)
Patient reports that she has ongoing constipation and has developed generalized abdominal pain. Took laxative over the weekend and has had no bowel movement since Saturday, no nausea or vomiting

## 2011-12-10 NOTE — ED Notes (Signed)
Pt alert and oriented x4. Respirations even and unlabored, bilateral symmetrical rise and fall of chest. Skin warm and dry. In no acute distress. Denies needs. Pt resting in bed.   

## 2011-12-10 NOTE — ED Provider Notes (Signed)
Medical screening examination/treatment/procedure(s) were conducted as a shared visit with non-physician practitioner(s) and myself.  I personally evaluated the patient during the encounter  Patient seen by me history of generalized abdominal pain for 3 days no nausea no vomiting no diarrhea specific etiology is not clear we'll get CT abdomen and pelvis to further evaluate and base disposition on those findings.    Shelda Jakes, MD 12/10/11 [phone]

## 2011-12-10 NOTE — ED Provider Notes (Signed)
History     CSN: [id number]  Arrival date & time 12/10/11  4098   First MD Initiated Contact with Patient 12/10/11 1414      Chief Complaint  Patient presents with  . Abdominal Pain    for the past 2-3 days   Patient is a 76 y.o. female presenting with abdominal pain.  Abdominal Pain The primary symptoms of the illness include abdominal pain. The primary symptoms of the illness do not include fever, fatigue, shortness of breath, nausea, vomiting, diarrhea, hematemesis, hematochezia, dysuria, vaginal discharge or vaginal bleeding.  Additional symptoms associated with the illness include constipation. Symptoms associated with the illness do not include chills, anorexia, diaphoresis, heartburn, urgency, hematuria, frequency or back pain.   Patient presents to the emergency room with complaint of diffuse abdominal pain for the past 2-3 years. Pateint reports that has had constipation without a bowel movement. Patient denies any nausea or vomiting. Patient has a PMH significant for breast cancer, remission for the past 8 years. Denies any chest pain or SOB.   Past Medical History  Diagnosis Date  . Constipation   . Hypertension   . Arthritis     History reviewed. No pertinent past surgical history.  No family history on file.  History  Substance Use Topics  . Smoking status: Not on file  . Smokeless tobacco: Not on file  . Alcohol Use:     OB History    Grav Para Term Preterm Abortions TAB SAB Ect Mult Living                  Review of Systems  Constitutional: Negative for fever, chills, diaphoresis, appetite change and fatigue.  HENT: Negative for neck pain.   Eyes: Negative for photophobia and visual disturbance.  Respiratory: Negative for cough, chest tightness and shortness of breath.   Cardiovascular: Negative for chest pain.  Gastrointestinal: Positive for abdominal pain and constipation. Negative for heartburn, nausea, vomiting, diarrhea, hematochezia, anorexia and  hematemesis.  Genitourinary: Negative for dysuria, urgency, frequency, hematuria, flank pain, vaginal bleeding and vaginal discharge.  Musculoskeletal: Negative for back pain.  Skin: Negative for rash.  Neurological: Negative for weakness and numbness.  All other systems reviewed and are negative.    Allergies  Review of patient's allergies indicates no known allergies.  Home Medications   Current Outpatient Rx  Name Route Sig Dispense Refill  . NAPROXEN SODIUM 220 MG PO TABS Oral Take 440 mg by mouth 2 (two) times daily with a meal.      . TERBINAFINE HCL 250 MG PO TABS Oral Take 250 mg by mouth daily.      . TRANDOLAPRIL 4 MG PO TABS Oral Take 4 mg by mouth daily.      Marland Kitchen VERAPAMIL HCL ER 240 MG PO TBCR Oral Take 240 mg by mouth 2 (two) times daily as needed. For hypertension       BP 178/74  Pulse 86  Temp(Src) 97.5 F (36.4 C) (Oral)  Resp 16  SpO2 99%  Physical Exam  Nursing note and vitals reviewed. Constitutional: She is oriented to person, place, and time. She appears well-developed and well-nourished.  Non-toxic appearance. No distress.       Vital signs stable  HENT:  Head: Normocephalic and atraumatic.  Eyes: EOM are normal. Pupils are equal, round, and reactive to light.  Neck: Normal range of motion. Neck supple.  Cardiovascular: Normal rate, regular rhythm, normal heart sounds and intact distal pulses.  Exam reveals no  gallop and no friction rub.   No murmur heard. Pulmonary/Chest: Effort normal and breath sounds normal. No respiratory distress. She has no wheezes.  Abdominal: Soft. Bowel sounds are normal. She exhibits no distension and no mass. There is tenderness. There is no rebound and no guarding.  Genitourinary:       Chaperone present during examination. No stool in the rectal vault. Normal stool color. Anal tone present.  Musculoskeletal: Normal range of motion. She exhibits no edema and no tenderness.  Neurological: She is alert and oriented to  person, place, and time. She exhibits normal muscle tone.  Skin: Skin is warm and dry. No rash noted. She is not diaphoretic.  Psychiatric: She has a normal mood and affect. Her behavior is normal. Judgment and thought content normal.    ED Course  Procedures (including critical care time)  Patient seen and evaluated.  VSS reviewed. . Nursing notes reviewed. Discussed with attending physician. Initial testing ordered. Will monitor the patient closely. They agree with the treatment plan and diagnosis.   Results for orders placed during the hospital encounter of 12/10/11  OCCULT BLOOD, POC DEVICE      Component Value Range   Fecal Occult Bld NEGATIVE    CBC      Component Value Range   WBC 8.1  4.0 - 10.5 (K/uL)   RBC 5.30 (*) 3.87 - 5.11 (MIL/uL)   Hemoglobin 14.9  12.0 - 15.0 (g/dL)   HCT 56.2  13.0 - 86.5 (%)   MCV 82.8  78.0 - 100.0 (fL)   MCH 28.1  26.0 - 34.0 (pg)   MCHC 33.9  30.0 - 36.0 (g/dL)   RDW 78.4  69.6 - 29.5 (%)   Platelets 160  150 - 400 (K/uL)  DIFFERENTIAL      Component Value Range   Neutrophils Relative 58  43 - 77 (%)   Neutro Abs 4.7  1.7 - 7.7 (K/uL)   Lymphocytes Relative 31  12 - 46 (%)   Lymphs Abs 2.5  0.7 - 4.0 (K/uL)   Monocytes Relative 8  3 - 12 (%)   Monocytes Absolute 0.7  0.1 - 1.0 (K/uL)   Eosinophils Relative 2  0 - 5 (%)   Eosinophils Absolute 0.2  0.0 - 0.7 (K/uL)   Basophils Relative 1  0 - 1 (%)   Basophils Absolute 0.1  0.0 - 0.1 (K/uL)  COMPREHENSIVE METABOLIC PANEL      Component Value Range   Sodium 137  135 - 145 (mEq/L)   Potassium 4.1  3.5 - 5.1 (mEq/L)   Chloride 104  96 - 112 (mEq/L)   CO2 24  19 - 32 (mEq/L)   Glucose, Bld 87  70 - 99 (mg/dL)   BUN 17  6 - 23 (mg/dL)   Creatinine, Ser 2.84  0.50 - 1.10 (mg/dL)   Calcium 9.6  8.4 - 13.2 (mg/dL)   Total Protein 7.8  6.0 - 8.3 (g/dL)   Albumin 3.6  3.5 - 5.2 (g/dL)   AST 14  0 - 37 (U/L)   ALT 13  0 - 35 (U/L)   Alkaline Phosphatase 82  39 - 117 (U/L)   Total Bilirubin  0.2 (*) 0.3 - 1.2 (mg/dL)   GFR calc non Af Amer 60 (*) >90 (mL/min)   GFR calc Af Amer 70 (*) >90 (mL/min)  LIPASE, BLOOD      Component Value Range   Lipase 195 (*) 11 - 59 (U/L)  URINALYSIS, ROUTINE  W REFLEX MICROSCOPIC      Component Value Range   Color, Urine YELLOW  YELLOW    APPearance CLOUDY (*) CLEAR    Specific Gravity, Urine 1.022  1.005 - 1.030    pH 5.5  5.0 - 8.0    Glucose, UA NEGATIVE  NEGATIVE (mg/dL)   Hgb urine dipstick NEGATIVE  NEGATIVE    Bilirubin Urine NEGATIVE  NEGATIVE    Ketones, ur NEGATIVE  NEGATIVE (mg/dL)   Protein, ur 30 (*) NEGATIVE (mg/dL)   Urobilinogen, UA 1.0  0.0 - 1.0 (mg/dL)   Nitrite NEGATIVE  NEGATIVE    Leukocytes, UA NEGATIVE  NEGATIVE   URINE MICROSCOPIC-ADD ON      Component Value Range   Squamous Epithelial / LPF MANY (*) RARE    WBC, UA 0-2  <3 (WBC/hpf)   RBC / HPF 0-2  <3 (RBC/hpf)   Bacteria, UA RARE  RARE    Ct Abdomen Pelvis W Contrast  12/10/2011  *RADIOLOGY REPORT*  Clinical Data: Diffuse abdominal pain.  Constipation.  History of breast cancer.  Previous cholecystectomy and hysterectomy.  CT ABDOMEN AND PELVIS WITH CONTRAST  Technique:  Multidetector CT imaging of the abdomen and pelvis was performed following the standard protocol during bolus administration of intravenous contrast.  Contrast: OMNIPAQUE IOHEXOL 300 MG/ML IV SOLN  Comparison: 01/30/2008 and radiographs obtained earlier today.  Findings: Diffuse wall thickening involving the gastric antrum and pyloric region.  This measures 1.0 cm in thickness on sagittal image number 32 and coronal image number 10.  Mildly dilated proximal small bowel loops with normal caliber distal small bowel loops.  Normal passage of oral contrast through the small bowel and into the colon.  Multiple colonic diverticula without evidence of diverticulitis. Small umbilical hernia containing fat.  Multiple bilateral renal calculi.  The largest is arising from the lower pole of the left kidney  and contains a thin partial internal septation.  This measures 5.4 cm in maximum diameter.  The lateral segment of the left lobe of the liver and caudate lobe of the liver are mildly prominent.  No abnormal liver contour lobulation.  The spleen is normal in size.  Unremarkable pancreas, adrenal glands and urinary bladder.  The gallbladder is surgically absent, as is the uterus.  No adnexal masses or enlarged lymph nodes.  Clear lung bases.  Lumbar and lower thoracic spine degenerative changes.  Atheromatous arterial calcifications.  IMPRESSION:  1.  Diffuse wall thickening in the region of the distal gastric antrum and pylorus.  This can be seen with gastritis and ulcer disease.  This does not have the typical appearance of a concentric neoplasm. 2.  Colonic diverticulosis without evidence of diverticulitis. 3.  Small umbilical hernia containing fat. 4.  Multiple bilateral renal cysts, including a 5.4 cm Bosniak category II lower pole left renal cyst. 5.  Mild proximal small bowel ileus or partial obstruction.  Original Report Authenticated By: Darrol Angel, M.D.   Dg Abd Acute W/chest  12/10/2011  *RADIOLOGY REPORT*  Clinical Data: Abdominal pain.  Constipation.  Breast carcinoma.  ACUTE ABDOMEN SERIES (ABDOMEN 2 VIEW & CHEST 1 VIEW)  Comparison: 08/12/2006  Findings: No evidence of dilated bowel loops or air fluid levels. Moderate colonic stool burden noted.  No evidence of free air.  No definite radiopaque calculi identified.  Aortoiliac vascular calcification noted.  Heart size remains within normal limits.  Ectasia of the thoracic aorta stable.  Both lungs are clear.  IMPRESSION: No acute findings.  Original Report Authenticated By: Danae Orleans, M.D.     Patient seen and re-evaluated. Resting comfortably. VSS stable. NAD. Patient notified of testing results. Stated agreement and understanding. Patient stated understanding to treatment plan and diagnosis.   5:10 PM patient seen and evaluated. Drank  contrast without problems. Notified of testing results. No pain at this time. Pending ct scan.   Spoke to Dr. Michaell Cowing who will see the patient for further evaluation.  6:52 PM spoke to dr. Michaell Cowing who states that this is not a small bowel obstruction. No tenderness on re-examination. Advised miralax and follow up with Dr. Dickie La for further evaluation of her CT findings, and endoscopy/colonoscopy.  No vomiting in the ED. Will place senna PR and advised patient of miralax colon prep to help clear herself. No obstipation on examination. Dr. Michaell Cowing states that there is contrast that runs entirely into the transverse colon, which is not consistent with an obstruction. Patient states that she will follow up with her primary care physician tomorrow. No mass seen on CT scan. Denies that an NG would help. Patient came and saw the patient personally and does not feel that she needs to be admitted for this issue. Discussed this with Dr. Effie Shy who agrees.   MDM  Constipation, follow up with Dr. Dickie La for further evaluation.     Demetrius Charity, PA 12/10/11 1902

## 2011-12-10 NOTE — Consult Note (Signed)
[name], [name] NO.:  [id number]  MEDICAL RECORD NO.:  [id number]  LOCATION:  WA06                         FACILITY:  [facility]  PHYSICIAN:  Ardeth Sportsman, MD     DATE OF BIRTH:  1936/02/05  DATE OF CONSULTATION: DATE OF DISCHARGE:                                CONSULTATION   PRIMARY CARE PHYSICIAN:  ?Lorelle Formosa, M.D.  PRIMARY GASTROLOGIST:  Hedwig Morton. Juanda Chance, MD,  [facility].  REQUESTING PHYSICIANS:  Arvella Merles, [facility]  SURGEON:  Ardeth Sportsman, MD  REASON FOR EVALUATION:  Abdominal pain, question of bowel obstruction.  HISTORY OF PRESENT ILLNESS:  Linda [name] is a 76 year old morbidly obese female with a history of chronic constipation and diverticulosis.  Linda [name] has had consultations, was followed in the mid-2000s by Dr. Juanda Chance in [facility].  Linda [name] seemed to improve on MiraLAX b.i.d.  Linda [name] has a family history of colon cancer in a parent and a grandparent according to some data and patient recalls, none in the patient herself.  Linda [name] has had endoscopies, the last one in 2006, which showed no polyps.  It was recommended Linda [name] come in 2011.  It did not happen.  The patient notes about a 3-day history of diffuse crampy abdominal pain.  No nausea or vomiting.  It does not seem it is worse with eating, but more with turning and twisting.  Linda [name] normally has a coffee in the morning.  Linda [name] has a bowel movement about twice a week.  Linda [name] has not been on regular MiraLAX in years.  Linda [name] uses MiraLAX and Correctol p.r.n.  Linda [name] tried MiraLAX a week ago and "it did not do anything."  Linda [name] then tried Correctol a few days later.  Linda [name] had a small bowel movement after that. Linda [name] has not had a well-formed bowel movement in over a week.  Linda [name] is having flatus.  No sick contacts or travel history.  No history of inflammatory bowel disease or irritable bowel syndrome.  Linda [name] denies any history of pancreatitis or  hepatitis.  Linda [name] had a cholecystectomy done in open fashion decades ago.  No other abdominal surgeries that Linda [name] can recall.  Because Linda [name] was only smoking and drinking coffee and barely keeping liquids down, her daughter recommended that Linda [name] come in to be evaluated. Linda [name] brought her to the emergency room.  PAST MEDICAL HISTORY: 1. Breast cancer, status post right lumpectomy and sentinel lymph node     dissection in 2004, followed by Dr. Arline Asp, and disease free for     8 years. 2. Morbid obesity. 3. Hypertension. 4. Chronic constipation. 5. Arthritis. 6. Question of fibroid uterus. 7. Colon diverticulosis. 8. Strong family history of colon cancer, but I do not think Linda [name] has     had any colon polyps herself.  PAST SURGICAL HISTORY:  As noted above with a right lumpectomy in 2004 and an open cholecystectomy done probably in the 80s.  MEDICATIONS:  Noted in the system.  Linda [name] takes again Correctol p.r.n. Linda [name] takes naproxen a couple of times a week.  Linda [name] takes Lamisil daily, Linda [name] takes trandolapril 4 mg daily, Linda [name] takes verapamil 240 mg  daily.  ALLERGIES:  None known.  FAMILY HISTORY:  Colon cancer in a first and second degree relative, Linda [name] cannot recall much else.  SOCIAL HISTORY:  Linda [name] comes today with her daughter.  Linda [name] denies any alcohol or drug use, Linda [name] does smoke.  REVIEW OF SYSTEMS:  As noted per HPI.  In general, her weight has been stable.  Energy level has been good.  No fevers, chills or sweats. Eyes, ENT negative.  Cardiac, no exertional chest pain, orthopnea or PND.  Lungs, no productive cough or recent cold, coughs flus or bronchitis.  Abdomen as noted above.  No dysphagia to solids or liquids. Rare heartburn or reflux.  No history of ulcers or gastritis that Linda [name] can recall.  No hematemesis, hematochezia or melena.  No dysphagia to solids or liquids.  Bowel movement, again twice a week at best with the help of laxatives, MiraLAX.  GU, no hematuria or pyuria.   Hepatic, renal, endocrine otherwise negative.  Musculoskeletal, some knee soreness for which Linda [name] takes Naprosyn regularly, that has been stable, no recent changes.  Skin, no new sores or lesions.  Heme, lymph, allergic negative.  Breasts, again right breast lumpectomy, followup negative according to the patient.  PHYSICAL EXAMINATION:  VITAL SIGNS:  Temperature 99.2, currently 97.5, pulse around 90, blood pressure 184/86, sat on room air 98%.  Linda [name] was about 6 to 7/10 pain, now Linda [name] is down to about 3/10 pain. GENERAL:  Linda [name] is a well-developed, well-nourished, morbidly obese female, sitting up, in no acute distress.  Linda [name] is not cachectic.  Eyes, pupils equal, round, and reactive to light.  Extraocular movements are intact.  Her sclerae are nonicteric or ashen. PSYCH:  Linda [name] has no evidence of any dementia, delirium, psychosis, or paranoia.  Linda [name] looks calm, relaxed. NEUROLOGICAL:  Linda [name] has a normal gait.  Cranial nerves II through XII are intact.  Hand grips 5/5, equal and symmetrical.  No resting or intention tremors. SKIN:  No petechiae or purpura.  No other sores or lesions. BREASTS:  No nipple discharge or masses.  No nipple bleeding. LYMPH:  No head, neck, axillary, or groin lymphadenopathy. HEENT:  Linda [name] is normocephalic.  Mucous membranes are moist.  Nasopharynx, oropharynx, clear. CHEST:  No pain on rib or sternal compression. HEART:  Regular rate and rhythm.  No murmurs, clicks, rubs. LUNGS:  Clear to auscultation bilaterally.  No wheezes, rales, rhonchi. ABDOMEN:  Morbidly obese, but soft.  Linda [name] has an upper midline incision. Linda [name] has a questionable small umbilical hernia, it is easily reducible and only mildly sensitive.  Linda [name] has mild abdominal discomfort, but no peritonitis, no focal tenderness.  No Murphy sign.  No tenderness at McBurney's point.  No epigastric tenderness.  Linda [name] has a moderate panniculus, but the skin underneath is clear. GENITAL:  No vaginal bleeding.  No inguinal  hernias. RECTAL:  According to the physician assistant's note, Linda [name] did have a moderate amount of rectal stool in the vault, but grossly heme negative. Normal sphincter tone.  I did not repeat it. MUSCULOSKELETAL:  Pretty good range of motion at shoulders, elbows, wrists, as well as hips, knees, and ankles.  LABORATORY VALUES:  Her electrolytes all look pretty normal except for a lipase of 195 that is mildly elevated, it was 71 three years ago.  Most of her liver function tests are normal.  CBC is normal.  Hemoglobin is 14.9 without a left shift.  Urinalysis, normal range as well.  CT scan, question of ileus as well as  a small bowel obstruction.  Some renal cysts, small umbilical hernia.  Some diverticulosis, no diverticulitis.  This showed some thickening in the distal antrum and pylorus suspicious for gastritis or ulceration, but not consistent with neoplasia.  No definite lymphadenopathy is seen.  To my examination the bowel obstruction is a very soft call at best. There is no massively dilated small bowel.  Oral contrast easily goes to the mid transverse colon.  This is after only a ~2 hours after drinking oral ocontrast.  The patient drank all the contrast without difficulty & denies any crampy abdominal pain after drinking contrast.  Linda [name] is having flatus.  ASSESSMENT/PLAN:  This is a 76 year old morbidly obese female with severe chronic constipation and noncompliance on bowel regimen without a bowel movement in a week with diffuse abdominal pain, but no real nausea or vomiting, and CT scan not making a strong argument for bowel obstruction in my mind.  I think Linda [name] needs a good bowel regimen.  I strongly recommend Linda [name] get back on MiraLAX b.i.d. and initially increase MiraLAX to double or triple dose to get her bowels moving.  Family history of cancer.  Last note of Dr. Regino Schultze in 2006 recommended colonoscopy in 2011.  Linda [name] is overdue.  I strongly recommend they consider  following her and just checking to see if that is still needed versus a 10 year followup since then.  Antral thickening suspicious for gastritis.  Linda [name] smokes, drinks coffee, uses nonsteroidals, and acidic drinks/foods.  Linda [name] is at risk.  I recommend Linda [name] stop all nonsteroidals and consider a proton pump inhibitor trial such as omeprazole 20 mg p.o. b.i.d. for the next 3 weeks and see if things calm down.  Consider follow up with Dr. Juanda Chance of Gastroenterology to see if upper and lower endoscopy is warranted or at least follow up on this.  Stop smoking.  If Linda [name] has severe nausea, vomiting, or if things do not improve or Linda [name] feels worse then may need to re-evaluate with scans to see if Linda [name] has obstruction or some other etiology, but otherwise I am underwhelmed.  It looks like that umbilical hernia has been stable for years and is not particularly symptomatic, only has fat and is not a transition point.  I would not rush to fix it since Linda [name] is morbidly obese, with some fair amount of comorbidities and her hypertension is not well controlled anyway.  I had a discussion with the ER team.  I do not see any reason for surgery.  If Linda [name] can move her bowels and feel better, Linda [name] can transition to outpatient care.  If Linda [name] gets admitted we can help follow with Medicine, but I do not think we have much to add at this point.  I suspect that the antral thickening needs to be followed up with to make sure that that is doing better, perhaps just a clinical evaluation versus endoscopy if not improved.     Ardeth Sportsman, MD     SCG/MEDQ  D:  12/10/2011  T:  12/10/2011  Job:  [id number]

## 2011-12-10 NOTE — ED Notes (Signed)
General surgery consult talking with PA, advised RN to wait on inserting NG tube until his assessment.

## 2011-12-11 ENCOUNTER — Telehealth: Payer: Self-pay | Admitting: Internal Medicine

## 2011-12-11 NOTE — Telephone Encounter (Signed)
Reviewed and agree. Last colon 2006. Hx breast ca. She will see Amy.

## 2011-12-11 NOTE — Telephone Encounter (Signed)
Spoke with patient and she reports she went to ED last night for abdominal pain "all over" and constipation. She states they did a CT scan and told her she had a blockage.(mild proximal small bowel ileus or partial obstruction) She was given an rx for Miralax entire bottle with gator aid and drink until output is clear. She is going to do this today. She states they told her to call for f/u with Dr. Juanda Chance and that she is due for a colonoscopy. Patient states she has to have an early AM appointment due to transportation. Scheduled patient with Mike Gip, PA on 12/14/11 at 8:30 AM.

## 2011-12-14 ENCOUNTER — Ambulatory Visit (INDEPENDENT_AMBULATORY_CARE_PROVIDER_SITE_OTHER): Payer: PRIVATE HEALTH INSURANCE | Admitting: Physician Assistant

## 2011-12-14 ENCOUNTER — Encounter: Payer: Self-pay | Admitting: Physician Assistant

## 2011-12-14 ENCOUNTER — Other Ambulatory Visit (INDEPENDENT_AMBULATORY_CARE_PROVIDER_SITE_OTHER): Payer: PRIVATE HEALTH INSURANCE

## 2011-12-14 DIAGNOSIS — R1013 Epigastric pain: Secondary | ICD-10-CM

## 2011-12-14 DIAGNOSIS — R110 Nausea: Secondary | ICD-10-CM

## 2011-12-14 LAB — CBC WITH DIFFERENTIAL/PLATELET
Basophils Relative: 0.3 % (ref 0.0–3.0)
Eosinophils Relative: 1.2 % (ref 0.0–5.0)
HCT: 46 % (ref 36.0–46.0)
MCV: 84.7 fl (ref 78.0–100.0)
Monocytes Absolute: 0.7 10*3/uL (ref 0.1–1.0)
Monocytes Relative: 7.6 % (ref 3.0–12.0)
Neutrophils Relative %: 66 % (ref 43.0–77.0)
RBC: 5.44 Mil/uL — ABNORMAL HIGH (ref 3.87–5.11)
WBC: 9.1 10*3/uL (ref 4.5–10.5)

## 2011-12-14 LAB — AMYLASE: Amylase: 98 U/L (ref 27–131)

## 2011-12-14 LAB — LIPASE: Lipase: 121 U/L — ABNORMAL HIGH (ref 11.0–59.0)

## 2011-12-14 MED ORDER — TRAMADOL HCL 50 MG PO TABS: ORAL_TABLET | ORAL | Status: DC

## 2011-12-14 NOTE — Patient Instructions (Addendum)
You have been given a separate informational sheet regarding your tobacco use, the importance of quitting and local resources to help you quit.  We sent prescription to [facility] [facility] [address] for Ultram for pain. Take as directed.  We made you an appointment with Dr Juanda Chance for 01-13-2012.

## 2011-12-15 ENCOUNTER — Telehealth: Payer: Self-pay | Admitting: *Deleted

## 2011-12-15 NOTE — Telephone Encounter (Signed)
Message copied by Daphine Deutscher on Tue Dec 15, 2011  1:40 PM ------      Message from: Mike Gip S      Created: Tue Dec 15, 2011 11:41 AM       Please let Kira know her pancreas blood tests are still elevated,but better. See how she is feeling.

## 2011-12-15 NOTE — Telephone Encounter (Signed)
good

## 2011-12-15 NOTE — Telephone Encounter (Signed)
Patient given results. She states she is feeling a "whole lot better."

## 2011-12-15 NOTE — Progress Notes (Signed)
Agree with initial assessment and plans. Amy, follow up on her blood work and clinical progress. Thanks . jp=

## 2011-12-15 NOTE — Progress Notes (Signed)
Subjective:    Patient ID: Linda [name], female    DOB: Sep 16, 1936, 76 y.o.   MRN: [id number]  HPI Linda [name] is a pleasant 76 year old [place] American female known to Dr.Dora Psychologist, counselling. She last underwent colonoscopy in 2006. She did not have any polyps at that time, she does have diverticular disease. Patient does have family history of colon cancer and a personal history of breast cancer. She is status post cholecystectomy.  He comes  In  today after onset of significant upper abdominal pain which started about 5 days ago. She says this was associated with mild nausea but no vomiting and no fever. She had been constipated for a couple of days prior to that and says that usually she gets very uncomfortable when she is constipated, so she took MiraLax thinking this would help but it did not make any difference. She says the pain is much more intense than what she normally has with constipation. She went to the emergency room on Friday, 12/10/2011 due to this pain and had evaluation  with unremarkable CBC and CMET. Her lipase however was elevated at 195.  CT scan of the abdomen and pelvis was done the same day showing some diffuse wall thickening of the distal gastric antrum and pylorus, colonic diverticulosis ,an umbilical hernia and bilateral renal cysts . There is also mention of a mild proximal small bowel ileus versus obstruction.  Patient was told that she was constipated and was given the MiraLax prep to take at home. She did that but continued to have pain and yesterday when she tried to eat solid food said "a doubled her up". Today she is feeling better and says that her pain is probably a 2 or 3. She says she hurts worse immediately after eating. She had been given in the proximal to use her pain has not been using any other regular aspirin or NSAIDs, she denies any alcohol use. On further questioning she did recently start on Lamisil for fungus on her toes and has been on this for several  weeks.    Review of Systems  Constitutional: Positive for appetite change.  HENT: Negative.   Eyes: Negative.   Respiratory: Negative.   Cardiovascular: Negative.   Gastrointestinal: Positive for nausea, abdominal pain and constipation.  Genitourinary: Negative.   Musculoskeletal: Negative.   Skin: Negative.   Neurological: Negative.   Hematological: Negative.   Psychiatric/Behavioral: Negative.    Outpatient Prescriptions Prior to Visit  Medication Sig Dispense Refill  . naproxen sodium (ANAPROX) 220 MG tablet Take 440 mg by mouth 2 (two) times daily with a meal.        . omeprazole (PRILOSEC) 20 MG capsule Take 1 capsule (20 mg total) by mouth daily.  30 capsule  0  . terbinafine (LAMISIL) 250 MG tablet Take 250 mg by mouth daily.        . trandolapril (MAVIK) 4 MG tablet Take 4 mg by mouth daily.        . verapamil (CALAN-SR) 240 MG CR tablet Take 240 mg by mouth 2 (two) times daily as needed. For hypertension       . polyethylene glycol powder (GLYCOLAX/MIRALAX) powder Take 17 g by mouth 2 (two) times daily. Place the entire contents in two 32 oz bottles of gaterade, drink until rectal output is clear and you have had rectal output of stool.  255 g  0   No Known Allergies     Objective:   Physical Exam Well-developed obese African  American female in no acute distress, pleasant, alert and oriented x3. HEENT;  Normocephalic, EOMI, PERRLA, sclera anicteric,Neck; Supple no JVD, Pulmonary; clear bilaterally, Abdomen;soft she is tender across the epigastrium there is no guarding no rebound no palpable mass or hepatosplenomegaly bowel sounds are active, Rectal; not done, Extremities; no clubbing cyanosis or edema, Psych; mood and affect normal and appropriate.        Assessment & Plan:  #20 76 year old African female with acute upper abdominal pain x5 days, and elevated lipase at the time of the ER visit on 12/10/2011. She has persistent upper abdominal discomfort which is gradually  improving. I suspect she actually has had a mild pancreatitis, probably drug induced secondary to Lamisil. I do not think that her upper abdominal discomfort is or was related to constipation. #2 diverticulosis #3 positive family history of colon cancer #4 personal history of breast cancer Plan; we will check amylase lipase and CBC today Patient asked to eat a soft low fat diet over the next several days until her pain resolved Stop Lamisil Stop Anaprox which she was given through the emergency room Increase Prilosec to 20 mg by mouth twice daily x2 weeks Patient will need to followup with Dr. Juanda Chance in about 6 months to consider followup colonoscopy, further plans in the short-term depending on her course.

## 2012-01-04 ENCOUNTER — Encounter: Payer: Self-pay | Admitting: *Deleted

## 2012-01-13 ENCOUNTER — Encounter: Payer: Self-pay | Admitting: Internal Medicine

## 2012-01-13 ENCOUNTER — Ambulatory Visit (INDEPENDENT_AMBULATORY_CARE_PROVIDER_SITE_OTHER): Payer: PRIVATE HEALTH INSURANCE | Admitting: Internal Medicine

## 2012-01-13 VITALS — BP 120/64 | HR 80 | Ht 64.0 in | Wt 180.0 lb

## 2012-01-13 DIAGNOSIS — K859 Acute pancreatitis without necrosis or infection, unspecified: Secondary | ICD-10-CM

## 2012-01-13 MED ORDER — PEG-KCL-NACL-NASULF-NA ASC-C 100 G PO SOLR: 1.0000 | Freq: Once | ORAL | Status: DC

## 2012-01-13 NOTE — Progress Notes (Signed)
Linda [name] 1936/02/25 MRN [id number]     History of Present Illness:  This is a 76 year old African American female who is here today for followup of acute abdominal pain which occurred last month. She saw Mike Gip, PA-C for suspected pancreatitis attributed to Lamisil. Her CT scan of the abdomen showed constipation and thickening of the gastric antrum as well as an umbilical hernia and a renal cyst. Her lipase was elevated at 115. She discontinued Lamisil and her pain resolved. She has no complaints today. Her constipation has also resolved. She has a family history of colon cancer in her grandmother. Her last colonoscopy in September 2006 showed moderately severe diverticulosis. She is due for a repeat colonoscopy. She has a personal history of breast cancer.   Past Medical History  Diagnosis Date  . Constipation   . Hypertension   . Arthritis   . Nail fungus   . Diverticulosis of colon (without mention of hemorrhage)   . Breast cancer   . Uterine fibroid   . GERD (gastroesophageal reflux disease)    Past Surgical History  Procedure Date  . Right lumpectomy with right sentinel lymph node dissection 2004    Dr. Lurene Shadow  . Cholecystectomy ~1980  . Vaginal hysterectomy     reports that she has been smoking Cigarettes.  She has been smoking about 1 pack per day. She has never used smokeless tobacco. She reports that she does not drink alcohol or use illicit drugs. family history includes Colon cancer in her maternal grandmother and mother. No Known Allergies      Review of Systems: Denies dysphagia heartburn chest pain or shortness of breath  The remainder of the 10 point ROS is negative except as outlined in H&P   Physical Exam: General appearance  Well developed, in no distress. Eyes- non icteric. HEENT nontraumatic, normocephalic. Mouth no lesions, tongue papillated, no cheilosis. Neck supple without adenopathy, thyroid not enlarged, no carotid bruits, no  JVD. Lungs Clear to auscultation bilaterally. Cor normal S1, normal S2, regular rhythm, no murmur,  quiet precordium. Abdomen: Soft relaxed with normal active bowel sounds. No focal tenderness. Post cholecystectomy scar. No palpable mass. Rectal: Not done Extremities no pedal edema. Skin no lesions. Neurological alert and oriented x 3. Psychological normal mood and affect.  Assessment and Plan:  Problem #1 Complete resolution of acute abdominal pain attributed to pancreatitis which was likely drug related. Her constipation has also resolved. She is due for a recall colonoscopy which will be scheduled in the near future.   01/13/2012 Lina Sar

## 2012-01-13 NOTE — Patient Instructions (Addendum)
You have been scheduled for a colonoscopy with propofol. Please follow written instructions given to you at your visit today.  Please pick up your prep kit at the pharmacy within the next 2-3 days. CC: Dr Trilby Drummer, Dr Arline Asp

## 2012-02-09 ENCOUNTER — Encounter (INDEPENDENT_AMBULATORY_CARE_PROVIDER_SITE_OTHER): Payer: PRIVATE HEALTH INSURANCE

## 2012-02-09 ENCOUNTER — Other Ambulatory Visit: Payer: Self-pay | Admitting: Cardiology

## 2012-02-09 DIAGNOSIS — R0989 Other specified symptoms and signs involving the circulatory and respiratory systems: Secondary | ICD-10-CM

## 2012-02-09 DIAGNOSIS — L98499 Non-pressure chronic ulcer of skin of other sites with unspecified severity: Secondary | ICD-10-CM

## 2012-02-09 DIAGNOSIS — I739 Peripheral vascular disease, unspecified: Secondary | ICD-10-CM

## 2012-02-16 ENCOUNTER — Encounter: Payer: Self-pay | Admitting: Internal Medicine

## 2012-02-16 ENCOUNTER — Ambulatory Visit (AMBULATORY_SURGERY_CENTER): Payer: PRIVATE HEALTH INSURANCE | Admitting: Internal Medicine

## 2012-02-16 VITALS — BP 110/65 | HR 98 | Temp 98.1°F | Resp 18

## 2012-02-16 DIAGNOSIS — Z800 Family history of malignant neoplasm of digestive organs: Secondary | ICD-10-CM

## 2012-02-16 DIAGNOSIS — Z1211 Encounter for screening for malignant neoplasm of colon: Secondary | ICD-10-CM

## 2012-02-16 MED ORDER — SODIUM CHLORIDE 0.9 % IV SOLN: 500.0000 mL | INTRAVENOUS | Status: DC

## 2012-02-16 NOTE — Patient Instructions (Signed)

## 2012-02-16 NOTE — Op Note (Signed)
[facility] [address] [place], [place]  [address]  COLONOSCOPY PROCEDURE REPORT  PATIENT:  Linda, [name]  MR#:  [id number] BIRTHDATE:  1936/05/20, 76 yrs. old  GENDER:  female ENDOSCOPIST:  Hedwig Morton. Juanda Chance, MD REF. BY:  Billee Cashing, M.D. PROCEDURE DATE:  02/16/2012 PROCEDURE:  Colonoscopy 19147 ASA CLASS:  Class III INDICATIONS:  family history of colon cancer parent and grandparent with colon cancer last exam 2006 recent episode of abd. pain evaluated in ER suspected SBO but responded to laxatives. MEDICATIONS:   MAC sedation, administered by CRNA, propofol (Diprivan) 220 mg  DESCRIPTION OF PROCEDURE:   After the risks and benefits and of the procedure were explained, informed consent was obtained. Digital rectal exam was performed and revealed no rectal masses. The LB CF-[id number] endoscope was introduced through the anus and advanced to the cecum, which was identified by both the appendix and ileocecal valve.  The quality of the prep was good, using MoviPrep.  The instrument was then slowly withdrawn as the colon was fully examined. <<PROCEDUREIMAGES>>  FINDINGS:  Moderate diverticulosis was found (see image1, image4, image6, and image7). tortuous spastic colon, difficult to traverse,  This was otherwise a normal examination of the colon (see image2, image3, image4, image5, image6, and image8). Retroflexed views in the rectum revealed no abnormalities.    The scope was then withdrawn from the patient and the procedure completed.  COMPLICATIONS:  None ENDOSCOPIC IMPRESSION: 1) Moderate diverticulosis 2) Otherwise normal examination pt was hypertensive during the procedure 238/120 RECOMMENDATIONS: 1) High fiber diet.  REPEAT EXAM:  In 0 year(s) for. no recall due to difficult intubation labile vital signs during procedure  ______________________________ Hedwig Morton. Juanda Chance, MD  CC:  Karie Soda, MDDonald Murinson, MD  n. Rosalie DoctorHedwig Morton.  Veora Fonte at 02/16/2012 08:45 AM  Linden Dolin, [phone]

## 2012-02-16 NOTE — Progress Notes (Signed)
PT'S BLOOD PRESSURE 192/89 UPON ARRIVAL TO RECOVERY. REPOSITIONED CUFF TO RIGHT LOWER LEG AND REGISTERED 101/65.  CRNA QUESTIONED ACCURACY OF THE BP ON THE LEG. PT WARM AND DRY AND ORIENTED X 3. COMPLAINING OF GAS PAINS AND ENCOURAGED TO BARE DOWN TO PASS AIR. PT DID AND PASSED LARGE AMOUNT OF GAS/AIR WITH IMMEDIATE RELIEF OF GAS PAINS. PT DENIES PAIN OR DISCOMFORT AT THIS TME. DAUGHTER AT BEDSIDE.  DAUGHTER STATES HER MOM DID NOT TAKE HER BP MEDICINE THIS MORNING. EWM   BP ON RIGHT LEG 137/65. PT STATES HER NOSE IS STUFFY SINCE PROCEDURE. PT DID VOMIT BILE APPEARNG EMESIS THAT'S ON PILLOW WITH PROCEDURE.  INSTRUCTED COULD BE FROM THAT AND TO MONITOR, CAN USE SALINE IN NOSE AT HOME. PT DENIES PAIN AT THIS TIME   SITTING SUPINE PER PTS REQUEST. EWM  Patient did not experience any of the following events: a burn prior to discharge; a fall within the facility; wrong site/side/patient/procedure/implant event; or a hospital transfer or hospital admission upon discharge from the facility. [phone]) Patient did not have preoperative order for IV antibiotic SSI prophylaxis. [phone])

## 2012-02-17 ENCOUNTER — Telehealth: Payer: Self-pay | Admitting: *Deleted

## 2012-02-17 NOTE — Telephone Encounter (Signed)
  Follow up Call-  Call back number 02/16/2012  Post procedure Call Back phone  # [phone]  Permission to leave phone message Yes     Patient questions:  Do you have a fever, pain , or abdominal swelling? no Pain Score  0 *  Have you tolerated food without any problems? yes  Have you been able to return to your normal activities? yes  Do you have any questions about your discharge instructions: Diet   no Medications  no Follow up visit  no  Do you have questions or concerns about your Care? no  Actions: * If pain score is 4 or above: No action needed, pain <4.

## 2012-02-22 ENCOUNTER — Other Ambulatory Visit: Payer: Self-pay | Admitting: Cardiology

## 2012-02-22 DIAGNOSIS — I739 Peripheral vascular disease, unspecified: Secondary | ICD-10-CM

## 2012-02-24 ENCOUNTER — Ambulatory Visit (INDEPENDENT_AMBULATORY_CARE_PROVIDER_SITE_OTHER): Payer: PRIVATE HEALTH INSURANCE | Admitting: Cardiovascular Disease

## 2012-02-24 ENCOUNTER — Encounter: Payer: Self-pay | Admitting: Cardiovascular Disease

## 2012-02-24 ENCOUNTER — Encounter (INDEPENDENT_AMBULATORY_CARE_PROVIDER_SITE_OTHER): Payer: PRIVATE HEALTH INSURANCE

## 2012-02-24 VITALS — BP 184/91 | HR 94 | Wt 180.0 lb

## 2012-02-24 DIAGNOSIS — I739 Peripheral vascular disease, unspecified: Secondary | ICD-10-CM

## 2012-02-24 HISTORY — DX: Peripheral vascular disease, unspecified: I73.9

## 2012-02-24 NOTE — Assessment & Plan Note (Signed)
The patient has significant bilateral peripheral arterial disease with a severely reduced ABI on the right and moderately reduced ABI on the left. Remarkably she has minimal symptoms at the present time. Her exam is suggestive of significant proximal stenosis on the right and this corresponds with the findings of her duplex scan that showed severe external iliac stenosis on that side. Her left femoral pulse is actually quite strong. There are no signs of critical limb ischemia in either leg. We discussed the importance of tobacco cessation at length with specific discussion of how it adversely affects outcomes in patients with PAD. I have also recommended the patient start aspirin 81 mg daily and consider starting a statin drug, but deferred this to her primary care physician because I don't have any lab values here. We'll see her back in 6 months for followup evaluation. If she develops claudication symptoms, rest pain, or other problems related to her legs I asked her to call me sooner. We also discussed the importance of good foot care.

## 2012-02-24 NOTE — Progress Notes (Signed)
HPI:  This is a 76 year old woman presenting for evaluation of lower extremity peripheral arterial disease. The patient was initially seen for onychomycosis of the right great toe. She received Lamisil and ultimately developed pancreatitis. She was later referred to Dr. Bari Edward who noted that she had an abnormal pulse exam. She was sent for ABIs and a larger may duplex study. Her ABIs were 32% on the right and 65% on the left. Duplex evaluation demonstrated inflow disease on the right and total occlusion of the right distal superficial femoral artery. On the left there is severe left SFA stenosis.  The patient's foot is improved. She does she is really having no problems at the present time. She's not active but she denies leg pain. She is able to go to the grocery store without having to stop and rest. She denies pain in the feet. She's had some problems with her right knee in the past but it's not bothering her at the present time. She has no chest pain or dyspnea. She continues to smoke cigarettes and has been doing so for about 50 years. She has no history of nonhealing wound or ulcer. She has no history of rest pain.  Outpatient Encounter Prescriptions as of 02/24/2012  Medication Sig Dispense Refill  . trandolapril (MAVIK) 4 MG tablet Take 4 mg by mouth daily.        . verapamil (CALAN-SR) 240 MG CR tablet Take 240 mg by mouth 2 (two) times daily as needed. For hypertension       . aspirin EC 81 MG tablet Take 1 tablet (81 mg total) by mouth daily.  1 tablet  0  . DISCONTD: omeprazole (PRILOSEC) 20 MG capsule Take 1 capsule (20 mg total) by mouth daily.  30 capsule  0  . DISCONTD: terbinafine (LAMISIL) 250 MG tablet       . DISCONTD: traMADol (ULTRAM) 50 MG tablet         Review of patient's allergies indicates no known allergies.  Past Medical History  Diagnosis Date  . Constipation   . Hypertension   . Arthritis   . Nail fungus   . Diverticulosis of colon (without mention of  hemorrhage)   . Breast cancer   . Uterine fibroid   . GERD (gastroesophageal reflux disease)     Past Surgical History  Procedure Date  . Right lumpectomy with right sentinel lymph node dissection 2004    Dr. Lurene Shadow  . Cholecystectomy ~1980  . Vaginal hysterectomy     History   Social History  . Marital Status: Single    Spouse Name: N/A    Number of Children: 2  . Years of Education: N/A   Occupational History  . Retired    Social History Main Topics  . Smoking status: Current Everyday Smoker -- 1.0 packs/day    Types: Cigarettes  . Smokeless tobacco: Never Used   Comment: form given 12/14/11  . Alcohol Use: No  . Drug Use: No  . Sexually Active: No   Other Topics Concern  . Not on file   Social History Narrative  . No narrative on file    Family History  Problem Relation Age of Onset  . Colon cancer Mother   . Colon cancer Maternal Grandmother    ROS: General: no fevers/chills/night sweats Eyes: no blurry vision, diplopia, or amaurosis ENT: no sore throat or hearing loss Resp: no cough, wheezing, or hemoptysis CV: no edema or palpitations GI: no abdominal pain, nausea, vomiting,  diarrhea, or constipation GU: no dysuria, frequency, or hematuria Skin: no rash Neuro: no headache, numbness, tingling, or weakness of extremities Musculoskeletal: no joint pain or swelling Heme: no bleeding, DVT, or easy bruising Endo: no polydipsia or polyuria  BP 184/91  Pulse 94  Wt 81.647 kg (180 lb)  PHYSICAL EXAM: Pt is alert and oriented, pleasant woman, WD, WN, in no distress. HEENT: normal Neck: JVP normal. Carotid upstrokes normal with bilateral bruits. No thyromegaly. Lungs: equal expansion, clear bilaterally CV: Apex is discrete and nondisplaced, RRR with grade 2/6 systolic ejection murmur at the left sternal border Abd: soft, NT, +BS, no bruit, obese Back: no CVA tenderness Ext: no C/C/E        Femoral pulses trace on the right and 2+ on the left. DP and PT  pulses are nonpalpable. The feet are warm bilaterally Skin: There is no rash Neuro: CNII-XII intact             Strength intact = bilaterally  EKG:  Normal sinus rhythm 91 beats per minute, within normal limits.  ASSESSMENT AND PLAN:

## 2012-02-24 NOTE — Patient Instructions (Signed)
Your physician wants you to follow-up in: 6 MONTHS.  You will receive a reminder letter in the mail two months in advance. If you don't receive a letter, please call our office to schedule the follow-up appointment.  Your physician has recommended you make the following change in your medication: START Aspirin 81mg  take one by mouth daily  Please discuss starting a statin drug with Dr Ronne Binning.

## 2012-06-14 ENCOUNTER — Other Ambulatory Visit: Payer: Self-pay | Admitting: Oncology

## 2012-06-14 DIAGNOSIS — Z1231 Encounter for screening mammogram for malignant neoplasm of breast: Secondary | ICD-10-CM

## 2012-06-28 ENCOUNTER — Ambulatory Visit
Admission: RE | Admit: 2012-06-28 | Discharge: 2012-06-28 | Disposition: A | Discharge status: Home / Self Care | Payer: PRIVATE HEALTH INSURANCE | Source: Ambulatory Visit | Attending: Oncology | Admitting: Oncology

## 2012-06-28 DIAGNOSIS — Z1231 Encounter for screening mammogram for malignant neoplasm of breast: Secondary | ICD-10-CM

## 2012-10-24 ENCOUNTER — Ambulatory Visit (INDEPENDENT_AMBULATORY_CARE_PROVIDER_SITE_OTHER): Payer: PRIVATE HEALTH INSURANCE | Admitting: Cardiovascular Disease

## 2012-10-24 ENCOUNTER — Encounter: Payer: Self-pay | Admitting: Cardiovascular Disease

## 2012-10-24 VITALS — BP 166/83 | HR 85 | Resp 16 | Ht 64.0 in | Wt 181.0 lb

## 2012-10-24 DIAGNOSIS — I739 Peripheral vascular disease, unspecified: Secondary | ICD-10-CM

## 2012-10-24 DIAGNOSIS — I70219 Atherosclerosis of native arteries of extremities with intermittent claudication, unspecified extremity: Secondary | ICD-10-CM

## 2012-10-24 NOTE — Patient Instructions (Signed)
Your physician wants you to follow-up in: 1 YEAR with Dr Cooper.  You will receive a reminder letter in the mail two months in advance. If you don't receive a letter, please call our office to schedule the follow-up appointment.  Your physician has requested that you have an ankle brachial index (ABI) in 1 YEAR. During this test an ultrasound and blood pressure cuff are used to evaluate the arteries that supply the arms and legs with blood. Allow thirty minutes for this exam. There are no restrictions or special instructions.  Your physician recommends that you continue on your current medications as directed. Please refer to the Current Medication list given to you today.  

## 2012-10-24 NOTE — Progress Notes (Signed)
   HPI:  76 year old woman presenting for followup of lower extremity peripheral arterial disease. She was initially seen 02/24/2012. She had ABIs that were markedly abnormal at 0.32 on the right and 0.65 on the left. She was noted to have total occlusion of the distal right superficial femoral artery as well as a suggestion of inflow disease on the right. There is severe left SFA stenosis based on arterial duplex studies.  The patient reports no clinical change since I have seen her last. She denies calf or thigh pain with ambulation. She does have hip and knee pain, primarily on the right. She denies ulceration. She's continued to have some problems with the right great toenail. She continues to smoke cigarettes. She denies chest pain or dyspnea.  Outpatient Encounter Prescriptions as of 10/24/2012  Medication Sig Dispense Refill  . aspirin EC 81 MG tablet Take 1 tablet (81 mg total) by mouth daily.  1 tablet  0  . pantoprazole (PROTONIX) 40 MG tablet Take 40 mg by mouth daily.      . trandolapril (MAVIK) 4 MG tablet Take 4 mg by mouth daily.        . verapamil (CALAN-SR) 240 MG CR tablet Take 240 mg by mouth 2 (two) times daily as needed. For hypertension         No Known Allergies  Past Medical History  Diagnosis Date  . Constipation   . Hypertension   . Arthritis   . Nail fungus   . Diverticulosis of colon (without mention of hemorrhage)   . Breast cancer   . Uterine fibroid   . GERD (gastroesophageal reflux disease)     ROS: Negative except as per HPI  BP 166/83  Pulse 85  Resp 16  Ht 5\' 4"  (1.626 m)  Wt 82.101 kg (181 lb)  BMI 31.07 kg/m2  PHYSICAL EXAM: Pt is alert and oriented, NAD HEENT: normal Neck: JVP - normal Lungs: CTA bilaterally CV: RRR with a grade 2/6 systolic murmur radiating to the neck Abd: soft, NT, Positive BS, no hepatomegaly Ext: no C/C/E, pedal pulses nonpalpable Skin: warm/dry no rash. No ulceration present.  EKG:  Normal sinus rhythm, cannot  rule out anterior infarct age undetermined.  ASSESSMENT AND PLAN: 1. Lower extremity peripheral arterial disease. She continues to have minimal symptoms. I counseled her regarding the importance of tobacco cessation. She has a severely reduced ABI on the right but really does not have any functional limitation. Her anatomy based on duplex shows iliac disease and SFA occlusion. She will continue on aspirin 81 mg daily. I will see her back in one year with ABIs before her visit. She understands the importance of foot care and continued followup with Dr.Petrinitz.  2. Tobacco abuse - as above. Cessation counseling done.  3. HTN - suboptimal control based on today's reading. Pt taking verapamil and mavik. Follow-up PCP.  For follow-up I will see her back in 12 months or sooner if problems arise.  Tonny Bollman 10/24/2012 9:23 AM

## 2012-12-03 ENCOUNTER — Emergency Department (HOSPITAL_COMMUNITY): Payer: PRIVATE HEALTH INSURANCE

## 2012-12-03 ENCOUNTER — Encounter (HOSPITAL_COMMUNITY): Payer: Self-pay | Admitting: Family Medicine

## 2012-12-03 ENCOUNTER — Encounter (HOSPITAL_COMMUNITY): Payer: Self-pay | Admitting: Emergency Medicine

## 2012-12-03 ENCOUNTER — Emergency Department (INDEPENDENT_AMBULATORY_CARE_PROVIDER_SITE_OTHER)
Admission: EM | Admit: 2012-12-03 | Discharge: 2012-12-03 | Disposition: A | Discharge status: Nursing Home (Includes ICF) | Payer: PRIVATE HEALTH INSURANCE | Source: Home / Self Care | Attending: Family Medicine | Admitting: Family Medicine

## 2012-12-03 ENCOUNTER — Observation Stay (HOSPITAL_COMMUNITY)
Admission: EM | Admit: 2012-12-03 | Discharge: 2012-12-05 | Disposition: A | Discharge status: Home / Self Care | Payer: PRIVATE HEALTH INSURANCE | Attending: Family Medicine | Admitting: Family Medicine

## 2012-12-03 DIAGNOSIS — R0989 Other specified symptoms and signs involving the circulatory and respiratory systems: Secondary | ICD-10-CM | POA: Insufficient documentation

## 2012-12-03 DIAGNOSIS — I739 Peripheral vascular disease, unspecified: Secondary | ICD-10-CM

## 2012-12-03 DIAGNOSIS — E049 Nontoxic goiter, unspecified: Secondary | ICD-10-CM | POA: Insufficient documentation

## 2012-12-03 DIAGNOSIS — F17200 Nicotine dependence, unspecified, uncomplicated: Secondary | ICD-10-CM | POA: Insufficient documentation

## 2012-12-03 DIAGNOSIS — I10 Essential (primary) hypertension: Secondary | ICD-10-CM | POA: Insufficient documentation

## 2012-12-03 DIAGNOSIS — J984 Other disorders of lung: Secondary | ICD-10-CM

## 2012-12-03 DIAGNOSIS — I169 Hypertensive crisis, unspecified: Secondary | ICD-10-CM

## 2012-12-03 DIAGNOSIS — R0609 Other forms of dyspnea: Secondary | ICD-10-CM | POA: Insufficient documentation

## 2012-12-03 DIAGNOSIS — J101 Influenza due to other identified influenza virus with other respiratory manifestations: Principal | ICD-10-CM | POA: Insufficient documentation

## 2012-12-03 DIAGNOSIS — R0603 Acute respiratory distress: Secondary | ICD-10-CM

## 2012-12-03 DIAGNOSIS — Z79899 Other long term (current) drug therapy: Secondary | ICD-10-CM | POA: Insufficient documentation

## 2012-12-03 DIAGNOSIS — R509 Fever, unspecified: Secondary | ICD-10-CM

## 2012-12-03 DIAGNOSIS — Z853 Personal history of malignant neoplasm of breast: Secondary | ICD-10-CM | POA: Insufficient documentation

## 2012-12-03 LAB — BASIC METABOLIC PANEL
CO2: 23 mEq/L (ref 19–32)
Chloride: 100 mEq/L (ref 96–112)
GFR calc non Af Amer: 58 mL/min — ABNORMAL LOW (ref >90)
Glucose, Bld: 97 mg/dL (ref 70–99)
Potassium: 3.3 mEq/L — ABNORMAL LOW (ref 3.5–5.1)
Sodium: 135 mEq/L (ref 135–145)

## 2012-12-03 LAB — POCT I-STAT 3, ART BLOOD GAS (G3+)
pCO2 arterial: 35.8 mmHg (ref 35.0–45.0)
pO2, Arterial: 62 mmHg — ABNORMAL LOW (ref 80.0–100.0)

## 2012-12-03 LAB — CBC WITH DIFFERENTIAL/PLATELET
Eosinophils Absolute: 0.1 10*3/uL (ref 0.0–0.7)
HCT: 43.2 % (ref 36.0–46.0)
Hemoglobin: 13.5 g/dL (ref 12.0–15.0)
Lymphs Abs: 1.1 10*3/uL (ref 0.7–4.0)
MCH: 26.2 pg (ref 26.0–34.0)
Monocytes Relative: 9 % (ref 3–12)
Neutrophils Relative %: 68 % (ref 43–77)
RBC: 5.16 MIL/uL — ABNORMAL HIGH (ref 3.87–5.11)

## 2012-12-03 LAB — TROPONIN I: Troponin I: <0.3 ng/mL (ref <0.30)

## 2012-12-03 MED ORDER — ONDANSETRON HCL 4 MG/2ML IJ SOLN: 4.0000 mg | Freq: Four times a day (QID) | INTRAMUSCULAR | Status: DC | PRN

## 2012-12-03 MED ORDER — SODIUM CHLORIDE 0.9 % IV SOLN
Freq: Once | INTRAVENOUS | Status: AC
  Administered 2012-12-03: 15:00:00 via INTRAVENOUS

## 2012-12-03 MED ORDER — ALBUTEROL SULFATE (5 MG/ML) 0.5% IN NEBU
2.5000 mg | INHALATION_SOLUTION | Freq: Once | RESPIRATORY_TRACT | Status: AC
  Administered 2012-12-03: 2.5 mg via RESPIRATORY_TRACT
  Filled 2012-12-03: qty 0.5

## 2012-12-03 MED ORDER — VERAPAMIL HCL ER 240 MG PO TBCR
240.0000 mg | EXTENDED_RELEASE_TABLET | Freq: Two times a day (BID) | ORAL | Status: DC
  Administered 2012-12-03 – 2012-12-05 (×4): 240 mg via ORAL
  Filled 2012-12-03 (×5): qty 1

## 2012-12-03 MED ORDER — ALBUTEROL SULFATE (5 MG/ML) 0.5% IN NEBU
INHALATION_SOLUTION | RESPIRATORY_TRACT | Status: AC
  Filled 2012-12-03: qty 0.5

## 2012-12-03 MED ORDER — NICOTINE 21 MG/24HR TD PT24
21.0000 mg | MEDICATED_PATCH | Freq: Every day | TRANSDERMAL | Status: DC
  Administered 2012-12-04: 21 mg via TRANSDERMAL

## 2012-12-03 MED ORDER — ALBUTEROL SULFATE (5 MG/ML) 0.5% IN NEBU: 2.5000 mg | INHALATION_SOLUTION | RESPIRATORY_TRACT | Status: DC | PRN

## 2012-12-03 MED ORDER — METHYLPREDNISOLONE SODIUM SUCC 125 MG IJ SOLR
125.0000 mg | Freq: Once | INTRAMUSCULAR | Status: AC
  Administered 2012-12-03: 125 mg via INTRAVENOUS
  Filled 2012-12-03: qty 2

## 2012-12-03 MED ORDER — ACETAMINOPHEN-CODEINE #3 300-30 MG PO TABS
1.0000 | ORAL_TABLET | Freq: Four times a day (QID) | ORAL | Status: DC | PRN
  Administered 2012-12-03 – 2012-12-05 (×4): 1 via ORAL
  Filled 2012-12-03 (×5): qty 1

## 2012-12-03 MED ORDER — ALBUTEROL SULFATE (5 MG/ML) 0.5% IN NEBU
2.5000 mg | INHALATION_SOLUTION | Freq: Four times a day (QID) | RESPIRATORY_TRACT | Status: DC
  Administered 2012-12-03 – 2012-12-04 (×2): 2.5 mg via RESPIRATORY_TRACT
  Filled 2012-12-03 (×2): qty 0.5

## 2012-12-03 MED ORDER — TRANDOLAPRIL 4 MG PO TABS
4.0000 mg | ORAL_TABLET | Freq: Every day | ORAL | Status: DC
Start: 2012-12-03 — End: 2012-12-05
  Administered 2012-12-03 – 2012-12-05 (×3): 4 mg via ORAL
  Filled 2012-12-03 (×3): qty 1

## 2012-12-03 MED ORDER — PANTOPRAZOLE SODIUM 40 MG PO TBEC
40.0000 mg | DELAYED_RELEASE_TABLET | Freq: Every day | ORAL | Status: DC
  Administered 2012-12-03 – 2012-12-05 (×3): 40 mg via ORAL
  Filled 2012-12-03 (×2): qty 1

## 2012-12-03 MED ORDER — IPRATROPIUM BROMIDE 0.02 % IN SOLN
0.2500 mg | Freq: Once | RESPIRATORY_TRACT | Status: AC
  Administered 2012-12-03: 0.26 mg via RESPIRATORY_TRACT

## 2012-12-03 MED ORDER — ONDANSETRON HCL 4 MG PO TABS: 4.0000 mg | ORAL_TABLET | Freq: Four times a day (QID) | ORAL | Status: DC | PRN

## 2012-12-03 MED ORDER — ENOXAPARIN SODIUM 40 MG/0.4ML ~~LOC~~ SOLN
40.0000 mg | SUBCUTANEOUS | Status: DC
  Administered 2012-12-03 – 2012-12-04 (×2): 40 mg via SUBCUTANEOUS
  Filled 2012-12-03 (×3): qty 0.4

## 2012-12-03 MED ORDER — SODIUM CHLORIDE 0.9 % IJ SOLN
3.0000 mL | Freq: Two times a day (BID) | INTRAMUSCULAR | Status: DC
  Administered 2012-12-04 – 2012-12-05 (×3): 3 mL via INTRAVENOUS

## 2012-12-03 MED ORDER — SODIUM CHLORIDE 0.9 % IV BOLUS (SEPSIS)
500.0000 mL | Freq: Once | INTRAVENOUS | Status: AC
  Administered 2012-12-03: 500 mL via INTRAVENOUS

## 2012-12-03 MED ORDER — NICOTINE 21 MG/24HR TD PT24
21.0000 mg | MEDICATED_PATCH | Freq: Every day | TRANSDERMAL | Status: DC
  Administered 2012-12-03 – 2012-12-05 (×3): 21 mg via TRANSDERMAL
  Filled 2012-12-03 (×3): qty 1

## 2012-12-03 MED ORDER — GUAIFENESIN ER 600 MG PO TB12
600.0000 mg | ORAL_TABLET | Freq: Two times a day (BID) | ORAL | Status: DC
  Administered 2012-12-03 – 2012-12-05 (×4): 600 mg via ORAL
  Filled 2012-12-03 (×5): qty 1

## 2012-12-03 MED ORDER — ALBUTEROL SULFATE (5 MG/ML) 0.5% IN NEBU
2.5000 mg | INHALATION_SOLUTION | Freq: Once | RESPIRATORY_TRACT | Status: AC
  Administered 2012-12-03: 2.5 mg via RESPIRATORY_TRACT

## 2012-12-03 MED ORDER — ASPIRIN EC 81 MG PO TBEC
81.0000 mg | DELAYED_RELEASE_TABLET | Freq: Every day | ORAL | Status: DC
  Administered 2012-12-03 – 2012-12-05 (×3): 81 mg via ORAL
  Filled 2012-12-03 (×3): qty 1

## 2012-12-03 MED ORDER — LEVOFLOXACIN IN D5W 750 MG/150ML IV SOLN
750.0000 mg | Freq: Once | INTRAVENOUS | Status: AC
  Administered 2012-12-03: 750 mg via INTRAVENOUS
  Filled 2012-12-03: qty 150

## 2012-12-03 MED ORDER — ACETAMINOPHEN 325 MG PO TABS
ORAL_TABLET | ORAL | Status: AC
  Filled 2012-12-03: qty 2

## 2012-12-03 MED ORDER — ACETAMINOPHEN 325 MG PO TABS
650.0000 mg | ORAL_TABLET | Freq: Once | ORAL | Status: AC
  Administered 2012-12-03: 650 mg via ORAL

## 2012-12-03 NOTE — ED Notes (Signed)
Respiratory in with pt

## 2012-12-03 NOTE — ED Provider Notes (Addendum)
History    patient sent to the emergency department from urgent care for possibility of pneumonia. Patient has had a dry cough since last night. She also has had runny nose and sore throat. Today she has developed some nausea and vomiting without diarrhea. Her daycare was concerned about her reading about she might have pneumonia, centered to the ER. They also recommended treatment for blood pressure.  CSN: [id number]  Arrival date & time 12/03/12  1528   First MD Initiated Contact with Patient 12/03/12 1531      Chief Complaint  Patient presents with  . Pneumonia    (Consider location/radiation/quality/duration/timing/severity/associated sxs/prior treatment) HPI  Past Medical History  Diagnosis Date  . Constipation   . Hypertension   . Arthritis   . Nail fungus   . Diverticulosis of colon (without mention of hemorrhage)   . Breast cancer   . Uterine fibroid   . GERD (gastroesophageal reflux disease)     Past Surgical History  Procedure Date  . Right lumpectomy with right sentinel lymph node dissection 2004    Dr. Lurene Shadow  . Cholecystectomy ~1980  . Vaginal hysterectomy     Family History  Problem Relation Age of Onset  . Colon cancer Mother   . Colon cancer Maternal Grandmother     History  Substance Use Topics  . Smoking status: Current Every Day Smoker -- 0.5 packs/day    Types: Cigarettes  . Smokeless tobacco: Never Used     Comment: form given 12/14/11  . Alcohol Use: No    OB History    Grav Para Term Preterm Abortions TAB SAB Ect Mult Living                  Review of Systems  HEENT - right ovary, sore throat Respiratory - cough GI - nausea and vomiting All Other systems reviewed and are negative  Allergies  Review of patient's allergies indicates no known allergies.  Home Medications   Current Outpatient Rx  Name  Route  Sig  Dispense  Refill  . ASPIRIN EC 81 MG PO TBEC   Oral   Take 1 tablet (81 mg total) by mouth daily.   1 tablet  0   . PANTOPRAZOLE SODIUM 40 MG PO TBEC   Oral   Take 40 mg by mouth daily.         . TRANDOLAPRIL 4 MG PO TABS   Oral   Take 4 mg by mouth daily.           Marland Kitchen VERAPAMIL HCL ER 240 MG PO TBCR   Oral   Take 240 mg by mouth 2 (two) times daily.            BP 128/68  Pulse 121  Temp 100.2 F (37.9 C) (Oral)  Resp 22  SpO2 99%  Physical Exam  Gen - no distress HEENT - pupils equal and reactive light accommodation Muscle skeletal - neck is supple Respiratory - bilateral wheezing and slightly increased respiratory rate Abdominal - soft and nontender Cardiac - regular rate and rhythm Neuro - cranial nerves normal, no focal deficit  Skin - no rash Psych - normal mood   ED Course  Procedures (including critical care time)   Labs Reviewed  BASIC METABOLIC PANEL  CBC WITH DIFFERENTIAL  CULTURE, BLOOD (ROUTINE X 2)  CULTURE, BLOOD (ROUTINE X 2)  BLOOD GAS, ARTERIAL   Dg Chest 2 View  12/03/2012  *RADIOLOGY REPORT*  Clinical Data: Dry cough.  Chest ache.  Low grade fever.  Shortness of breath.  CHEST - 2 VIEW  Comparison: 04/21/2010  Findings: No focal airspace consolidation or airspace pulmonary edema. Cardiopericardial silhouette is at upper limits of normal for size. Interstitial markings are diffusely coarsened with chronic features.  Bones are diffusely demineralized.  Degenerative changes are seen in the left shoulder.  IMPRESSION: Borderline cardiomegaly with underlying chronic interstitial coarsening.  No acute cardiopulmonary process.   Original Report Authenticated By: Kennith Center, M.D.      No diagnosis found.    MDM  Patient referred to the ER for difficulty breathing. She does not have any known pulmonary history, but is a long-time smoker. Patient was seen at urgent care and sent to the ER because of mild respiratory distress. Patient's x-ray does not show any pneumonia but she does have diminished breath sounds bilaterally with mild wheezing. She is  hypoxic with oxygen saturation 91-93% on arrival. This has not improved with nebulizer treatments. Patient will require hospitalization for further treatment.        Gilda Crease, MD 12/03/12 1716  Gilda Crease, MD 12/21/12 [phone]

## 2012-12-03 NOTE — ED Notes (Signed)
Pt c/o dry cough since last night... Sx include: runny nose, vomiting, nauseas, sore throat... Denies: diarrhea.. She is alert w/some respiratory distress.

## 2012-12-03 NOTE — ED Provider Notes (Signed)
History     CSN: [id number]  Arrival date & time 12/03/12  1217   First MD Initiated Contact with Patient 12/03/12 1234      Chief Complaint  Patient presents with  . Cough    (Consider location/radiation/quality/duration/timing/severity/associated sxs/prior treatment) HPI Comments: 76 year old smoker female with history of hypertension. Here complaining of persistent cough, wheezing, sore throat and clear rhinorrhea, shortness of breath and fever and chills since yesterday. Symptoms also associated with nausea and vomiting. No diarrhea. Patient reports chest discomfort with coughing only. Has never used inhalers in the past.   Past Medical History  Diagnosis Date  . Constipation   . Hypertension   . Arthritis   . Nail fungus   . Diverticulosis of colon (without mention of hemorrhage)   . Breast cancer   . Uterine fibroid   . GERD (gastroesophageal reflux disease)     Past Surgical History  Procedure Date  . Right lumpectomy with right sentinel lymph node dissection 2004    Dr. Lurene Shadow  . Cholecystectomy ~1980  . Vaginal hysterectomy     Family History  Problem Relation Age of Onset  . Colon cancer Mother   . Colon cancer Maternal Grandmother     History  Substance Use Topics  . Smoking status: Current Every Day Smoker -- 0.5 packs/day    Types: Cigarettes  . Smokeless tobacco: Never Used     Comment: form given 12/14/11  . Alcohol Use: No    OB History    Grav Para Term Preterm Abortions TAB SAB Ect Mult Living                  Review of Systems  Constitutional: Positive for fever and chills. Negative for diaphoresis.  HENT: Positive for congestion, sore throat and rhinorrhea.   Eyes: Negative for discharge.  Respiratory: Positive for cough, chest tightness, shortness of breath and wheezing.   Gastrointestinal: Positive for nausea and vomiting. Negative for abdominal pain.  Musculoskeletal: Positive for myalgias and arthralgias.  Skin: Negative for  rash.  Neurological: Negative for dizziness and headaches.    Allergies  Review of patient's allergies indicates no known allergies.  Home Medications   Current Outpatient Rx  Name  Route  Sig  Dispense  Refill  . ASPIRIN EC 81 MG PO TBEC   Oral   Take 1 tablet (81 mg total) by mouth daily.   1 tablet   0   . PANTOPRAZOLE SODIUM 40 MG PO TBEC   Oral   Take 40 mg by mouth daily.         . TRANDOLAPRIL 4 MG PO TABS   Oral   Take 4 mg by mouth daily.           Marland Kitchen VERAPAMIL HCL ER 240 MG PO TBCR   Oral   Take 240 mg by mouth 2 (two) times daily as needed. For hypertension            BP 203/104  Pulse 128  Temp 101 F (38.3 C) (Oral)  Resp 24  SpO2 91%  Physical Exam  Nursing note and vitals reviewed. Constitutional: She is oriented to person, place, and time. She appears well-developed. She appears distressed.  HENT:  Head: Normocephalic and atraumatic.       Nasal Congestion with erythema and swelling of nasal turbinates, abundant clear rhinorrhea. Pharyngeal erythema no exudates. No uvula deviation. No trismus. TM's with increased vascular markings bilaterally, no swelling or bulging.  Eyes: Conjunctivae normal  are normal. Pupils are equal, round, and reactive to light. Right eye exhibits no discharge. Left eye exhibits no discharge.  Neck: No JVD present. No thyromegaly present.  Cardiovascular:       Tachycardic with a heart rate in the 120s. No low extremity edema  Pulmonary/Chest: She has wheezes. She has rales. She exhibits no tenderness.       Bilateral inspiratory and expiratory wheezing. Decrease breath sounds and a few crackles at the bases bilaterally.  Abdominal: Soft.  Lymphadenopathy:    She has no cervical adenopathy.  Neurological: She is alert and oriented to person, place, and time.  Skin: No rash noted. She is not diaphoretic.    ED Course  Procedures (including critical care time)  Labs Reviewed - No data to display No results  found.   1. Acute respiratory distress   2. Hypertensive crisis   3. Fever     EKG: Sinus tachycardia with a ventricular rate at 125 beats per minute. No ST or other acute ischemic changes.  MDM  76 year old smoker female with history of hypertension. Here complaining of persistent cough, shortness of breath and fever since yesterday.  on examination patient was in acute respiratory distress with a respiratory rate of 24 oxygen saturation 90-91%, hypertensive with a blood pressure 203/104, tachycardic with a heart rate in the 120s. On examination patient was febrile with a temperature 101.4 Fahrenheit , tachypnea and mildly orthopneic with some supraclavicular retractions and persistent cough. She had inspiratory and expiratory wheezing and impress bilateral decreased breath sounds and few crackles at the bases. Patient was given a albuterol/ipratropium 2.5/2.5 nebulization x1 was started on IV with gentle hydration and placed on a cardiac monitor, acetaminophen 650 mg given orally times one. EKG with sinus tachycardia and ventricular rate 125 bpm, no acute ischemic changes. Patient was alert and oriented and denies chest pain. Was transferred to the emergency department for further evaluation and management via [facility]. No x-rays or blood work were done at [facility] prior to discharge.         Sharin Grave, MD 12/05/12 [phone]

## 2012-12-03 NOTE — ED Notes (Signed)
[facility] called for pt. Transport to [facility]. Dispatcher states that a truck is available and will be en route shortly.

## 2012-12-03 NOTE — ED Notes (Signed)
Pt transferred here from [facility] for Pnuemonia

## 2012-12-03 NOTE — ED Notes (Signed)
Patient transported to X-ray 

## 2012-12-03 NOTE — ED Notes (Signed)
Iv  Started  20  Angio  l  Hand  1  Att

## 2012-12-03 NOTE — H&P (Signed)
Linda [name] is an 76 y.o. female.   Assessment/Plan 76 yo female with history of HTN, breast cancer, and PAD here with complaint of productive cough and shortness of breath.  Pulm #Dyspnea:  Productive cough with shortness of breath x1 day.  Her O2 saturations have been on the low side 90-92% on RA, with low pO2 on ABG.  No known diagnosis of COPD, however she has been a long term heavy smoker.  Her chest x-ray is without infiltrate and her white count is not elevated.  Also has long history of HTN and BP elevated at urgent care center, no signs of fluid overload on exam however.  Given fever without elevated wbc and no infiltrate on xray this is possibly due to viral illness.   -Admit to telemetry with droplet precautions -Flu swab -Check BNP and troponin to evaluate for cardiac cause -Hold off on further antibiotics.  Will repeat CXR in the morning to see if there may be some underlying pneumonia.  -O2 therapy to keep sats above 92% -Albuterol q6 scheduled/q2 prn -No dx of COPD, but would consider addition of steroids if not improving.  -Mucinex and tylenol #3 for cough/pain  #Tobacco Abuse:  -Smoking cessation counseling -Nicotine patch  CV #HTN:  BP significantly elevated at urgent care at 203/104.  Has been normotensive since arrival to [facility] ED.   -Monitor closely on telemetry -Continue home antihypertensives (verapamil and trandolapril) -Continue ASA  Endo #Goiter:  Large symmetric goiter, non-tender.   -Check TSH  FEN/GI - Cardiac diet - SLIV  PPx -Continue home protonix -Lovenox  Dispo: -Pending improvement of respiratory status    Chief Complaint: Cough, shortness of breath  HPI: 76 yo female with history of HTN, breast cancer, and PAD here with complaint of productive cough and shortness of breath.  Symptoms started yesterday, states that she was at a party last night and began to have increasing shortness of breath with productive cough.  Symptoms have  worsened into today and she has had "multiple coughing spells".  Went to urgent care and transferred to ED due to hypoxia and elevated BP.  She is a long term smoker but denies diagnosis of COPD.  She has had associated fever (Tmax 101), clear rhinorrhea and mild headache associated with this as well.  Does endorse some sharp chest pain with coughing.  She denies body aches, chills, nausea, vomiting, diarrhea.  Has not tried anything at home for treatment.   Past Medical History  Diagnosis Date  . Constipation   . Hypertension   . Arthritis   . Nail fungus   . Diverticulosis of colon (without mention of hemorrhage)   . Breast cancer   . Uterine fibroid   . GERD (gastroesophageal reflux disease)     Past Surgical History  Procedure Date  . Right lumpectomy with right sentinel lymph node dissection 2004    Dr. Lurene Shadow  . Cholecystectomy ~1980  . Vaginal hysterectomy     Family History  Problem Relation Age of Onset  . Colon cancer Mother   . Colon cancer Maternal Grandmother    Social History:  reports that she has been smoking Cigarettes.  She has been smoking about 1 packs per day. She has never used smokeless tobacco. She reports that she does not drink alcohol or use illicit drugs.  Allergies: No Known Allergies   (Not in a hospital admission)  Results for orders placed during the hospital encounter of 12/03/12 (from the past 48 hour(s))  BASIC METABOLIC PANEL     Status: Abnormal   Collection Time   12/03/12  5:41 PM      Component Value Range Comment   Sodium 135  135 - 145 mEq/L    Potassium 3.3 (*) 3.5 - 5.1 mEq/L    Chloride 100  96 - 112 mEq/L    CO2 23  19 - 32 mEq/L    Glucose, Bld 97  70 - 99 mg/dL    BUN 12  6 - 23 mg/dL    Creatinine, Ser 1.61  0.50 - 1.10 mg/dL    Calcium 9.2  8.4 - 09.6 mg/dL    GFR calc non Af Amer 58 (*) >90 mL/min    GFR calc Af Amer 68 (*) >90 mL/min   CBC WITH DIFFERENTIAL     Status: Abnormal   Collection Time   12/03/12  5:41 PM       Component Value Range Comment   WBC 5.2  4.0 - 10.5 K/uL    RBC 5.16 (*) 3.87 - 5.11 MIL/uL    Hemoglobin 13.5  12.0 - 15.0 g/dL    HCT 04.5  40.9 - 81.1 %    MCV 83.7  78.0 - 100.0 fL    MCH 26.2  26.0 - 34.0 pg    MCHC 31.3  30.0 - 36.0 g/dL    RDW 91.4  78.2 - 95.6 %    Platelets 150  150 - 400 K/uL    Neutrophils Relative 68  43 - 77 %    Neutro Abs 3.5  1.7 - 7.7 K/uL    Lymphocytes Relative 22  12 - 46 %    Lymphs Abs 1.1  0.7 - 4.0 K/uL    Monocytes Relative 9  3 - 12 %    Monocytes Absolute 0.5  0.1 - 1.0 K/uL    Eosinophils Relative 1  0 - 5 %    Eosinophils Absolute 0.1  0.0 - 0.7 K/uL    Basophils Relative 0  0 - 1 %    Basophils Absolute 0.0  0.0 - 0.1 K/uL   TROPONIN I     Status: Normal   Collection Time   12/03/12  8:00 PM      Component Value Range Comment   Troponin I <0.30  <0.30 ng/mL   POCT I-STAT 3, BLOOD GAS (G3+)     Status: Abnormal   Collection Time   12/03/12  8:03 PM      Component Value Range Comment   pH, Arterial 7.395  7.350 - 7.450    pCO2 arterial 35.8  35.0 - 45.0 mmHg    pO2, Arterial 62.0 (*) 80.0 - 100.0 mmHg    Bicarbonate 21.9  20.0 - 24.0 mEq/L    TCO2 23  0 - 100 mmol/L    O2 Saturation 91.0      Acid-base deficit 2.0  0.0 - 2.0 mmol/L    Patient temperature 98.6 F      Collection site RADIAL, ALLEN'S TEST ACCEPTABLE      Drawn by Operator      Sample type ARTERIAL      Dg Chest 2 View  12/03/2012  *RADIOLOGY REPORT*  Clinical Data: Dry cough.  Chest ache.  Low grade fever.  Shortness of breath.  CHEST - 2 VIEW  Comparison: 04/21/2010  Findings: No focal airspace consolidation or airspace pulmonary edema. Cardiopericardial silhouette is at upper limits of normal for size. Interstitial markings are diffusely coarsened with  chronic features.  Bones are diffusely demineralized.  Degenerative changes are seen in the left shoulder.  IMPRESSION: Borderline cardiomegaly with underlying chronic interstitial coarsening.  No acute  cardiopulmonary process.   Original Report Authenticated By: Kennith Center, M.D.     ROS Per HPI, otherwise negative Blood pressure 114/70, pulse 119, temperature 100.2 F (37.9 C), temperature source Oral, resp. rate 13, SpO2 97.00%.  Physical Exam  Constitutional:       Obese female, nad Nasal cannula in place   HENT:  Head: Normocephalic and atraumatic.  Mouth/Throat: Oropharynx is clear and moist. No oropharyngeal exudate.  Eyes: Conjunctivae normal are normal. Scleral icterus is present.  Neck: Thyromegaly (Large symmetric goiter present, non tender palpation) present.  Cardiovascular: Normal rate, regular rhythm and normal heart sounds.   Respiratory: Effort normal. She has wheezes (Faint wheezes bilaterally.  ).       Poor air movement throughout lung fields   GI: Soft. She exhibits no distension. There is no tenderness.  Musculoskeletal: She exhibits no edema and no tenderness (No calf tenderness or size discrepancy).  Lymphadenopathy:    She has no cervical adenopathy.  Neurological: She is alert.        Linda [name] 12/03/2012, 8:57 PM

## 2012-12-03 NOTE — ED Notes (Signed)
Attempted to call report to the floor, nurse unavailable.  Left my name and number, was told they'll call back.

## 2012-12-03 NOTE — ED Notes (Signed)
Notified respiratory that pt needs ABG.

## 2012-12-04 ENCOUNTER — Encounter (HOSPITAL_COMMUNITY): Payer: Self-pay | Admitting: *Deleted

## 2012-12-04 ENCOUNTER — Observation Stay (HOSPITAL_COMMUNITY): Payer: PRIVATE HEALTH INSURANCE

## 2012-12-04 DIAGNOSIS — J111 Influenza due to unidentified influenza virus with other respiratory manifestations: Secondary | ICD-10-CM

## 2012-12-04 LAB — COMPREHENSIVE METABOLIC PANEL
ALT: 11 U/L (ref 0–35)
Albumin: 3.1 g/dL — ABNORMAL LOW (ref 3.5–5.2)
Alkaline Phosphatase: 80 U/L (ref 39–117)
BUN: 12 mg/dL (ref 6–23)
Chloride: 105 mEq/L (ref 96–112)
GFR calc Af Amer: >90 mL/min (ref >90)
Glucose, Bld: 128 mg/dL — ABNORMAL HIGH (ref 70–99)
Potassium: 4.1 mEq/L (ref 3.5–5.1)
Sodium: 138 mEq/L (ref 135–145)
Total Bilirubin: 0.2 mg/dL — ABNORMAL LOW (ref 0.3–1.2)
Total Protein: 7.2 g/dL (ref 6.0–8.3)

## 2012-12-04 LAB — CBC WITH DIFFERENTIAL/PLATELET
Basophils Absolute: 0 10*3/uL (ref 0.0–0.1)
Basophils Relative: 0 % (ref 0–1)
Eosinophils Relative: 0 % (ref 0–5)
HCT: 41.9 % (ref 36.0–46.0)
MCHC: 32 g/dL (ref 30.0–36.0)
MCV: 83.3 fL (ref 78.0–100.0)
Monocytes Absolute: 0.1 10*3/uL (ref 0.1–1.0)
Platelets: 144 10*3/uL — ABNORMAL LOW (ref 150–400)
RDW: 13.6 % (ref 11.5–15.5)
WBC: 3.8 10*3/uL — ABNORMAL LOW (ref 4.0–10.5)

## 2012-12-04 LAB — INFLUENZA PANEL BY PCR (TYPE A & B): H1N1 flu by pcr: DETECTED — AB

## 2012-12-04 MED ORDER — BENZONATATE 100 MG PO CAPS
200.0000 mg | ORAL_CAPSULE | Freq: Three times a day (TID) | ORAL | Status: DC | PRN
  Administered 2012-12-04 (×3): 200 mg via ORAL
  Filled 2012-12-04 (×3): qty 2

## 2012-12-04 MED ORDER — MENTHOL 3 MG MT LOZG
1.0000 | LOZENGE | OROMUCOSAL | Status: DC | PRN
  Administered 2012-12-04: 3 mg via ORAL
  Filled 2012-12-04: qty 9

## 2012-12-04 MED ORDER — PREDNISONE 20 MG PO TABS
40.0000 mg | ORAL_TABLET | Freq: Every day | ORAL | Status: DC
  Administered 2012-12-04 – 2012-12-05 (×2): 40 mg via ORAL
  Filled 2012-12-04 (×4): qty 2

## 2012-12-04 MED ORDER — OSELTAMIVIR PHOSPHATE 75 MG PO CAPS
75.0000 mg | ORAL_CAPSULE | Freq: Two times a day (BID) | ORAL | Status: DC
  Administered 2012-12-04 – 2012-12-05 (×2): 75 mg via ORAL
  Filled 2012-12-04 (×3): qty 1

## 2012-12-04 NOTE — Progress Notes (Signed)
Family Medicine Teaching Service Attending Note  I discussed patient Finkbiner  with Dr. Ermalinda Memos and reviewed their note for today.  I agree with their assessment and plan.

## 2012-12-04 NOTE — H&P (Signed)
Family Medicine Teaching Service Attending Note  I interviewed and examined patient Linda [name] and reviewed their tests and x-rays.  I discussed with Dr. Jerolyn Center and reviewed their note for today.  I agree with their assessment and plan.     Additionally  Feels ok except when coughing.  No shortness of breath when ambulating No chest pain  Likely exacerbation of undiagnosed COPD without evidence of infection.  Would treat with steroids and follow up as outpatient for PFTs and counseling

## 2012-12-04 NOTE — Progress Notes (Signed)
Family Medicine Teaching Service Daily Progress Note Service Page: [phone]  Patient Assessment: 76 yo female with history of HTN, breast cancer, and PAD here with complaint of productive cough and shortness of breath.  Subjective:  Breathing a little better today. States that overall she feels fine but that she is having persistent SOB, sore throat, and cough. Cough not improved. Didn't sleep well and has a poor appetite today.   Objective: Temp:  [98 F (36.7 C)-101 F (38.3 C)] 98 F (36.7 C) (12/29 0456) Pulse Rate:  [81-128] 81  (12/29 0456) Resp:  [13-25] 20  (12/29 0456) BP: (114-203)/(52-104) 154/72 mmHg (12/29 0456) SpO2:  [91 %-100 %] 91 % (12/29 0456) FiO2 (%):  [28 %] 28 % (12/28 2112) Weight:  [175 lb 11.3 oz (79.7 kg)] 175 lb 11.3 oz (79.7 kg) (12/28 2242) Exam: Gen: NAD, alert, cooperative with exam HEENT: NCAT, MMM, No pharyngeal erythema.  CV: RRR, good S1/S2, no murmur Resp: non labored, quiet wheezes loudest on the right but presentt hroughout, O2 Saturation 94% on RA Abd: SNTND, BS present, no guarding or organomegaly Ext: No edema, warm Neuro: No gross deficits, sitting up in bed  I have reviewed the patient's medications, labs, imaging, and diagnostic testing.  Notable results are summarized below.  CBC BMET   Lab 12/04/12 0440 12/03/12 1741  WBC 3.8* 5.2  HGB 13.4 13.5  HCT 41.9 43.2  PLT 144* 150    Lab 12/04/12 0440 12/03/12 1741  NA 138 135  K 4.1 3.3*  CL 105 100  CO2 19 23  BUN 12 12  CREATININE 0.75 0.94  GLUCOSE 128* 97  CALCIUM 8.9 9.2     Imaging/Diagnostic Tests: PA and lateral CXR 12/29 IMPRESSION:  1. No acute cardiopulmonary abnormalities. 2. Stable interstitial coarsening.  DG CXR 12/28 IMPRESSION: Borderline cardiomegaly with underlying chronic interstitial coarsening. No acute cardiopulmonary process.  A/P- 76 yo female with history of HTN, breast cancer, and PAD here with complaint of productive cough and shortness  of breath.  Dyspnea:  - Improving, satting 94% obn RA and not needing PRN albuterol - Likely Viral URI, possibly COPD exacerbation but no formal Dx of COPD in the past - Flu pending, Repeat CXR does not show any consolidation.  - BNP 551, Troponin neg Times 1 - Levaquin X1 in the ED, hold off for now without obvious etiology - O2 therapy to keep sats above 92% - RA currently - Albuterol q6 scheduled/q2 prn  - No dx of COPD, but would consider addition of steroids if not improving.  - Mucinex and tylenol #3 for cough/pain   Tobacco Abuse:  -Smoking cessation counseling  -Nicotine patch   HTN:  - BP significantly elevated at urgent care at 203/104, 154/72 this am - Continue home antihypertensives (verapamil and trandolapril)  - Continue ASA  - Monitor on Tele  Goiter: - Large symmetric goiter, non-tender.  - TSH Low at 0.187  - F/u OP  FEN/GI: Cardiac diet, SLIV  PPx: Continue home protonix, Lovenox  Dispo: home today or tomorrow, pending respiratory improvement  Kevin Fenton, MD 12/04/2012, 7:13 AM

## 2012-12-05 LAB — BASIC METABOLIC PANEL
Chloride: 100 mEq/L (ref 96–112)
GFR calc Af Amer: 79 mL/min — ABNORMAL LOW (ref >90)
GFR calc non Af Amer: 68 mL/min — ABNORMAL LOW (ref >90)
Potassium: 4.2 mEq/L (ref 3.5–5.1)
Sodium: 135 mEq/L (ref 135–145)

## 2012-12-05 LAB — CBC
MCHC: 32.1 g/dL (ref 30.0–36.0)
Platelets: 160 10*3/uL (ref 150–400)
RDW: 13.7 % (ref 11.5–15.5)
WBC: 7.7 10*3/uL (ref 4.0–10.5)

## 2012-12-05 MED ORDER — OSELTAMIVIR PHOSPHATE 75 MG PO CAPS: 75.0000 mg | ORAL_CAPSULE | Freq: Two times a day (BID) | ORAL | Status: DC

## 2012-12-05 MED ORDER — BENZONATATE 200 MG PO CAPS: 200.0000 mg | ORAL_CAPSULE | Freq: Three times a day (TID) | ORAL | Status: DC | PRN

## 2012-12-05 MED ORDER — PREDNISONE 20 MG PO TABS: 40.0000 mg | ORAL_TABLET | Freq: Every day | ORAL | Status: DC

## 2012-12-05 MED ORDER — GUAIFENESIN ER 600 MG PO TB12: 600.0000 mg | ORAL_TABLET | Freq: Two times a day (BID) | ORAL | Status: DC

## 2012-12-05 MED ORDER — ACETAMINOPHEN-CODEINE #3 300-30 MG PO TABS: 1.0000 | ORAL_TABLET | Freq: Four times a day (QID) | ORAL | Status: DC | PRN

## 2012-12-05 NOTE — Discharge Summary (Signed)
Family Medicine Teaching [facility] Discharge Summary  Patient name: Linda [name] Medical record number: [id number] Date of birth: [birth year] Age: 76 y.o. Gender: female Date of Admission: 12/03/2012  Date of Discharge: 12/05/2012 Admitting Physician: Carney Living, MD  Primary Care Provider: Billee Cashing, MD  Indication for Hospitalization: Dyspnea Discharge Diagnoses:  1. Influwenza- H1N1 2. HTN 3. Tobacco Abuse 4. Goiter   Consultations: None  Significant Labs and Imaging:  PA and lateral CXR 12/29  IMPRESSION:  1. No acute cardiopulmonary abnormalities. 2. Stable interstitial coarsening.  DG CXR 12/28  IMPRESSION: Borderline cardiomegaly with underlying chronic interstitial coarsening. No acute cardiopulmonary process.   Rapid Flu POsitive for Flu A H1N1 PCR positive TSH 0.187   12/05/2012 05:26  Sodium 135  Potassium 4.2  Chloride 100  CO2 24  BUN 16  Creatinine 0.83  Calcium 9.4  GFR calc non Af Amer 68 (L)  GFR calc Af Amer 79 (L)  Glucose 114 (H)  WBC 7.7  RBC 5.13 (H)  Hemoglobin 13.8  HCT 43.0  MCV 83.8  MCH 26.9  MCHC 32.1  RDW 13.7  Platelets 160   Procedures: None  Brief Hospital Course:  76 yo female with history of HTN, breast cancer, and PAD here with complaint of productive cough and shortness of breath.   Dyspnea:  She presented to our ED with dyspnea. Bacterial pneumonia was ruled out with a CXR and she was tested for Flu. Heart failure was considered adn a BNP of 551 was obtained. We also considered COPD, although she does not have aformal diagnosis, and so started her on a coarse of prednisone. She was admitted and given O2 via  to keep her sats above 92% and scheduled nebulized albuterol. Her O2 was weened off over the following day and she maintained her saturations easily on RA before discharge. She was found to be flu positive and so was started on tamiflu withing 48 hours of the onset of her symptoms. She was  discharged home with prescriptions to complete a 5 day coarse of prednisone and tamiflu. She was also given tylenol 3 and guifenesen for her cough to use PRN. She was counseled on tobacco cessation.   HTN:  Her BP was reasonable throughout admission averaging around 150's/60's. All of her home BP meds were continued at her home doses (verapamil and trandolapril). We also continued her aspirin and monitored her on tele.   Goiter:  Se has a large goiter and a TSH was found to be 0.187. Treatment and evaluation was deferred for the outpatient setting.   Dispo: Home  Discharge Medications:    Medication List     As of 12/05/2012 12:35 PM    TAKE these medications         acetaminophen-codeine 300-30 MG per tablet   Commonly known as: TYLENOL #3   Take 1 tablet by mouth every 6 (six) hours as needed (cough).      aspirin EC 81 MG tablet   Take 1 tablet (81 mg total) by mouth daily.      benzonatate 200 MG capsule   Commonly known as: TESSALON   Take 1 capsule (200 mg total) by mouth 3 (three) times daily as needed for cough.      guaiFENesin 600 MG 12 hr tablet   Commonly known as: MUCINEX   Take 1 tablet (600 mg total) by mouth 2 (two) times daily.      oseltamivir 75 MG capsule   Commonly known  as: TAMIFLU   Take 1 capsule (75 mg total) by mouth 2 (two) times daily.      pantoprazole 40 MG tablet   Commonly known as: PROTONIX   Take 40 mg by mouth daily.      predniSONE 20 MG tablet   Commonly known as: DELTASONE   Take 2 tablets (40 mg total) by mouth daily with breakfast.      trandolapril 4 MG tablet   Commonly known as: MAVIK   Take 4 mg by mouth daily.      verapamil 240 MG CR tablet   Commonly known as: CALAN-SR   Take 240 mg by mouth 2 (two) times daily.       Issues for Follow Up: Consider evaluation for her Goiter, Consider work up for chronic exertional dyspnea.   Outstanding Results: None  Discharge Instructions: Please refer to Patient Instructions  section of EMR for full details.  Patient was counseled important signs and symptoms that should prompt return to medical care, changes in medications, dietary instructions, activity restrictions, and follow up appointments.       Follow-up Information    Follow up with Billee Cashing, MD. Call in 1 day. (for appointment in 1-2 weeks)    Contact information:   [address] [place] [address] [phone]          Discharge Condition: Carlena Sax, MD 12/05/2012, 12:35 PM

## 2012-12-05 NOTE — Progress Notes (Signed)
Discharge instructions given to pt along with prescription. Pt verbalized understanding. Pt is stable for discharge with daughter. Will call volunteers to have wheelchair brought up for her discharge.

## 2012-12-05 NOTE — Progress Notes (Signed)
Family Medicine Teaching Service Daily Progress Note Service Page: [phone]  Patient Assessment: 76 yo female with history of HTN, breast cancer, and PAD here with complaint of productive cough and shortness of breath.  Subjective:  Patient feels better today and feeling ready to go home. States that she is breathing easier. Her major complaint is sore throat, she is also c/o cough while laying in bed. She says that she is resting well in the chair. Able to drink liquids but not able to swallow food yet.   Objective: Temp:  [98.3 F (36.8 C)-98.8 F (37.1 C)] 98.3 F (36.8 C) (12/30 0509) Pulse Rate:  [68-84] 68  (12/30 0509) Resp:  [18-20] 20  (12/30 0509) BP: (124-161)/(52-63) 151/63 mmHg (12/30 0509) SpO2:  [100 %] 100 % (12/30 0509)  Exam: Gen: NAD, alert, cooperative with exam HEENT: NCAT, MMM, No pharyngeal erythema.  CV: RRR, good S1/S2, no murmur Resp: non labored, quiet scattered wheezes, O2 Saturation 96% on RA Abd: SNTND, BS present, no guarding or organomegaly Ext: No edema, warm Neuro: No gross deficits, sitting up in bed  I have reviewed the patient's medications, labs, imaging, and diagnostic testing.  Notable results are summarized below.  CBC BMET   Lab 12/05/12 0526 12/04/12 0440 12/03/12 1741  WBC 7.7 3.8* 5.2  HGB 13.8 13.4 13.5  HCT 43.0 41.9 43.2  PLT 160 144* 150    Lab 12/05/12 0526 12/04/12 0440 12/03/12 1741  NA 135 138 135  K 4.2 4.1 3.3*  CL 100 105 100  CO2 24 19 23   BUN 16 12 12   CREATININE 0.83 0.75 0.94  GLUCOSE 114* 128* 97  CALCIUM 9.4 8.9 9.2    Rapid Flu positive H1N1 PCR - Detected  Imaging/Diagnostic Tests: PA and lateral CXR 12/29 IMPRESSION:  1. No acute cardiopulmonary abnormalities. 2. Stable interstitial coarsening.  DG CXR 12/28 IMPRESSION: Borderline cardiomegaly with underlying chronic interstitial coarsening. No acute cardiopulmonary process.  A/P- 76 yo female with history of HTN, breast cancer, and PAD  here with complaint of productive cough and shortness of breath.  Dyspnea:  - Improving, satting 96% on RA and not needing PRN albuterol - Flu Positive - started Tamiflu last night at less than 48 hours since onset of symptoms - BNP 551, Troponin neg times 1 - Prednisone 40 mg qd started yesterday to treat possible undiagnosed COPD and this episode as an exacerbation.  - Levaquin X1 in the ED, hold off for now without obvious etiology - O2 therapy to keep sats above 92% - RA currently - Albuterol q4 PRN - Mucinex and tylenol #3 for cough/pain   Tobacco Abuse:  -Smoking cessation counseling  -Nicotine patch   HTN:  - BP reasonable today at 154/63, max in last 24 hours was 161/56 - Continue home antihypertensives (verapamil and trandolapril)  - Continue ASA  - Monitor on Tele  Goiter: - Large symmetric goiter, non-tender.  - TSH Low at 0.187  - F/u OP  FEN/GI: Cardiac diet, SLIV  PPx: Continue home protonix, Lovenox  Dispo: home today or tomorrow, pending respiratory improvement  Kevin Fenton, MD 12/05/2012, 7:12 AM

## 2012-12-05 NOTE — Progress Notes (Signed)
FMTS Attending Note Patient seen and examined by me, discussed with resident team and I agree with their assessment and plan.  Patient reports that she is breathing better, not requiring oxygen supplementation during my exam.   She does report some sore throat that makes eating somewhat difficult.  Able to take liquids and soft foods.  HEENT exam remarkable for a sizeable symmetric and nontender goiter.  Agree with plan for discharge today; she is instructed to follow up with her primary physician, Dr Ronne Binning regarding her goiter. She states she had been seen for this over 2 years ago.  Paula Compton, MD

## 2012-12-06 NOTE — Discharge Summary (Signed)
Patient seen and examined by me on the day of discharge; I agree with plan for discharge as per resident team. Paula Compton, MD

## 2012-12-10 LAB — CULTURE, BLOOD (ROUTINE X 2): Culture: NO GROWTH

## 2013-07-05 ENCOUNTER — Other Ambulatory Visit: Payer: Self-pay

## 2013-07-05 DIAGNOSIS — Z1231 Encounter for screening mammogram for malignant neoplasm of breast: Secondary | ICD-10-CM

## 2013-07-24 ENCOUNTER — Ambulatory Visit
Admission: RE | Admit: 2013-07-24 | Discharge: 2013-07-24 | Disposition: A | Discharge status: Home / Self Care | Payer: PRIVATE HEALTH INSURANCE | Source: Ambulatory Visit

## 2013-07-24 DIAGNOSIS — Z1231 Encounter for screening mammogram for malignant neoplasm of breast: Secondary | ICD-10-CM

## 2013-11-08 ENCOUNTER — Ambulatory Visit (INDEPENDENT_AMBULATORY_CARE_PROVIDER_SITE_OTHER): Payer: PRIVATE HEALTH INSURANCE | Admitting: Cardiovascular Disease

## 2013-11-08 ENCOUNTER — Encounter: Payer: Self-pay | Admitting: Cardiology

## 2013-11-08 ENCOUNTER — Ambulatory Visit (HOSPITAL_COMMUNITY): Payer: PRIVATE HEALTH INSURANCE | Attending: Cardiology | Admitting: Cardiology

## 2013-11-08 ENCOUNTER — Encounter: Payer: Self-pay | Admitting: Cardiovascular Disease

## 2013-11-08 VITALS — BP 130/80 | HR 76 | Ht 64.0 in | Wt 183.0 lb

## 2013-11-08 DIAGNOSIS — R0989 Other specified symptoms and signs involving the circulatory and respiratory systems: Secondary | ICD-10-CM

## 2013-11-08 DIAGNOSIS — I70209 Unspecified atherosclerosis of native arteries of extremities, unspecified extremity: Secondary | ICD-10-CM | POA: Insufficient documentation

## 2013-11-08 DIAGNOSIS — I10 Essential (primary) hypertension: Secondary | ICD-10-CM | POA: Insufficient documentation

## 2013-11-08 DIAGNOSIS — I739 Peripheral vascular disease, unspecified: Secondary | ICD-10-CM | POA: Insufficient documentation

## 2013-11-08 DIAGNOSIS — F17200 Nicotine dependence, unspecified, uncomplicated: Secondary | ICD-10-CM | POA: Insufficient documentation

## 2013-11-08 NOTE — Progress Notes (Signed)
HPI:  77 year old woman presenting for followup evaluation. She has been noted to have lower extremity peripheral arterial disease. She has a combination of inflow disease and superficial femoral artery occlusion. ABIs were done today and they are 0.34 on the right and 0.66 on the left. This is stable from her previous study. She has had intermittent claudication and has been managed medically because of very mild symptoms in the past.  The patient has no new complaints today. Her only limitation with respect to leg pain relates to her left knee. She has significant left knee pain with movement of the leg or ambulation. She denies calf claudication symptoms. She's had no resting pain of her feet. She has no history of ulceration or nonhealing wounds. She continues to smoke cigarettes. She denies chest pain, shortness of breath, or leg swelling.  Outpatient Encounter Prescriptions as of 11/08/2013  Medication Sig  . acetaminophen-codeine (TYLENOL #3) 300-30 MG per tablet Take 1 tablet by mouth every 6 (six) hours as needed (cough).  Marland Kitchen aspirin EC 81 MG tablet Take 1 tablet (81 mg total) by mouth daily.  . pantoprazole (PROTONIX) 40 MG tablet Take 40 mg by mouth daily.  . trandolapril (MAVIK) 4 MG tablet Take 4 mg by mouth daily.    . verapamil (CALAN-SR) 240 MG CR tablet Take 240 mg by mouth 2 (two) times daily.   . [DISCONTINUED] benzonatate (TESSALON) 200 MG capsule Take 1 capsule (200 mg total) by mouth 3 (three) times daily as needed for cough.  . [DISCONTINUED] guaiFENesin (MUCINEX) 600 MG 12 hr tablet Take 1 tablet (600 mg total) by mouth 2 (two) times daily.  . [DISCONTINUED] oseltamivir (TAMIFLU) 75 MG capsule Take 1 capsule (75 mg total) by mouth 2 (two) times daily.  . [DISCONTINUED] predniSONE (DELTASONE) 20 MG tablet Take 2 tablets (40 mg total) by mouth daily with breakfast.    No Known Allergies  Past Medical History  Diagnosis Date  . Constipation   . Hypertension   .  Arthritis   . Nail fungus   . Diverticulosis of colon (without mention of hemorrhage)   . Breast cancer   . Uterine fibroid   . GERD (gastroesophageal reflux disease)     ROS: Negative except as per HPI  BP 130/80  Pulse 76  Ht 5\' 4"  (1.626 m)  Wt 183 lb (83.008 kg)  BMI 31.40 kg/m2  PHYSICAL EXAM: Pt is alert and oriented, NAD HEENT: normal Neck: JVP - normal, carotids 2+= with bilateral bruits Lungs: CTA bilaterally CV: RRR without murmur or gallop Abd: soft, NT, Positive BS, no hepatomegaly Ext: no C/C/E, pedal pulses are nonpalpable bilaterally Skin: warm/dry no rash  EKG:  Sinus rhythm 76 beats per minute, first degree AV block with PR interval 230 ms, low-voltage QRS, otherwise within normal limits.  ASSESSMENT AND PLAN: 1. Lower extremity peripheral arterial disease. The patient's ABIs were reviewed and they are stable in comparison with last years study. The right ABI is in the severe range. Despite that, she has no symptoms involving the right leg. Will continue with medical therapy. Tobacco cessation was advised. She understands the need to continue aspirin 81 mg daily. She will followup in one year. We discussed the importance of good foot care.  2. Carotid bruits. Patient with significant vascular disease. Will check a screening carotid ultrasound to rule out significant ICA stenosis.  3. Tobacco abuse. Cessation advised.  4. Hypertension. Blood pressure is controlled on combination of trandolapril and verapamil.  5. Lipids. I do not have any copies of lipid studies. Will request labs from her primary care physician. Should aim for an LDL less than 70 in the setting of her vascular disease.  Tonny Bollman 11/08/2013 2:19 PM

## 2013-11-08 NOTE — Patient Instructions (Addendum)
Your physician wants you to follow-up in: 1 YEAR with Dr Cooper.  You will receive a reminder letter in the mail two months in advance. If you don't receive a letter, please call our office to schedule the follow-up appointment.  Your physician recommends that you continue on your current medications as directed. Please refer to the Current Medication list given to you today.  Your physician has requested that you have a carotid duplex. This test is an ultrasound of the carotid arteries in your neck. It looks at blood flow through these arteries that supply the brain with blood. Allow one hour for this exam. There are no restrictions or special instructions.   

## 2013-11-13 ENCOUNTER — Ambulatory Visit (HOSPITAL_COMMUNITY): Payer: PRIVATE HEALTH INSURANCE | Attending: Cardiology

## 2013-11-13 ENCOUNTER — Encounter (HOSPITAL_COMMUNITY): Payer: Self-pay | Admitting: Cardiovascular Disease

## 2013-11-13 ENCOUNTER — Encounter: Payer: Self-pay | Admitting: Cardiology

## 2013-11-13 DIAGNOSIS — I739 Peripheral vascular disease, unspecified: Secondary | ICD-10-CM | POA: Insufficient documentation

## 2013-11-13 DIAGNOSIS — F17200 Nicotine dependence, unspecified, uncomplicated: Secondary | ICD-10-CM | POA: Insufficient documentation

## 2013-11-13 DIAGNOSIS — I6529 Occlusion and stenosis of unspecified carotid artery: Secondary | ICD-10-CM | POA: Insufficient documentation

## 2013-11-13 DIAGNOSIS — R0989 Other specified symptoms and signs involving the circulatory and respiratory systems: Secondary | ICD-10-CM

## 2013-11-13 DIAGNOSIS — E049 Nontoxic goiter, unspecified: Secondary | ICD-10-CM | POA: Insufficient documentation

## 2013-11-13 DIAGNOSIS — I10 Essential (primary) hypertension: Secondary | ICD-10-CM

## 2013-11-13 NOTE — Progress Notes (Signed)
Patient ID: Linda [name], female   DOB: 1936/06/22, 77 y.o.   MRN: [id number] Erroneous encounter This encounter was created in error - please disregard.

## 2013-11-14 ENCOUNTER — Other Ambulatory Visit: Payer: Self-pay

## 2013-11-14 DIAGNOSIS — I6523 Occlusion and stenosis of bilateral carotid arteries: Secondary | ICD-10-CM

## 2013-11-17 ENCOUNTER — Other Ambulatory Visit: Payer: Self-pay | Admitting: *Deleted

## 2013-11-21 ENCOUNTER — Other Ambulatory Visit (HOSPITAL_COMMUNITY): Payer: Self-pay

## 2013-11-21 ENCOUNTER — Encounter: Payer: Self-pay | Admitting: Vascular Surgery

## 2013-12-15 ENCOUNTER — Encounter: Payer: Self-pay | Admitting: Surgery

## 2013-12-18 ENCOUNTER — Encounter: Payer: Self-pay | Admitting: Surgery

## 2013-12-18 ENCOUNTER — Other Ambulatory Visit: Payer: Self-pay | Admitting: Surgery

## 2013-12-18 ENCOUNTER — Ambulatory Visit (INDEPENDENT_AMBULATORY_CARE_PROVIDER_SITE_OTHER): Payer: PRIVATE HEALTH INSURANCE | Admitting: Surgery

## 2013-12-18 ENCOUNTER — Ambulatory Visit (HOSPITAL_COMMUNITY)
Admission: RE | Admit: 2013-12-18 | Discharge: 2013-12-18 | Disposition: A | Discharge status: Home / Self Care | Payer: PRIVATE HEALTH INSURANCE | Source: Ambulatory Visit | Attending: Surgery | Admitting: Surgery

## 2013-12-18 ENCOUNTER — Encounter (INDEPENDENT_AMBULATORY_CARE_PROVIDER_SITE_OTHER): Payer: Self-pay

## 2013-12-18 DIAGNOSIS — I779 Disorder of arteries and arterioles, unspecified: Secondary | ICD-10-CM

## 2013-12-18 DIAGNOSIS — I6529 Occlusion and stenosis of unspecified carotid artery: Secondary | ICD-10-CM

## 2013-12-18 DIAGNOSIS — I739 Peripheral vascular disease, unspecified: Secondary | ICD-10-CM

## 2013-12-18 HISTORY — DX: Disorder of arteries and arterioles, unspecified: I77.9

## 2013-12-18 LAB — CREATININE, SERUM: Creat: 0.85 mg/dL (ref 0.50–1.10)

## 2013-12-18 LAB — BUN: BUN: 13 mg/dL (ref 6–23)

## 2013-12-18 NOTE — Progress Notes (Signed)
Patient name: Linda [name] MRN: [id number] DOB: Nov 07, 1936 Sex: female   Referred by: Dr. Burt Knack  Reason for referral:  Chief Complaint  Patient presents with  . Carotid    new pt, RICA stenosis    HISTORY OF PRESENT ILLNESS: This is a very pleasant 78 year old female who was referred today for asymptomatic right carotid stenosis.  She underwent a carotid ultrasound recently which revealed greater than 80% stenosis on the right.  She remains asymptomatic.  Specifically, she denies numbness or weakness in either extremity.  She denies slurred speech.  She denies amaurosis fugax.  The patient suffers from peripheral vascular disease.  She is previously undergone angiography which revealed multiple level disease.  She is tolerating her symptoms currently despite significant reduction and bilateral lower extremity blood flow.  The patient is a current smoker.  She is medically managed for hypertension.  She is currently not taking a statin.  Past Medical History  Diagnosis Date  . Constipation   . Hypertension   . Arthritis   . Nail fungus   . Diverticulosis of colon (without mention of hemorrhage)   . Breast cancer   . Uterine fibroid   . GERD (gastroesophageal reflux disease)     Past Surgical History  Procedure Laterality Date  . Right lumpectomy with right sentinel lymph node dissection  2004    Dr. Bubba Camp  . Cholecystectomy  ~1980  . Vaginal hysterectomy      History   Social History  . Marital Status: Single    Spouse Name: N/A    Number of Children: 2  . Years of Education: N/A   Occupational History  . Retired    Social History Main Topics  . Smoking status: Current Every Day Smoker -- 0.50 packs/day    Types: Cigarettes  . Smokeless tobacco: Never Used     Comment: form given 12/14/11  . Alcohol Use: No  . Drug Use: No  . Sexual Activity: No   Other Topics Concern  . Not on file   Social History Narrative  . No narrative on file    Family  History  Problem Relation Age of Onset  . Colon cancer Mother   . Colon cancer Maternal Grandmother     Allergies as of 12/18/2013  . (No Known Allergies)    Current Outpatient Prescriptions on File Prior to Visit  Medication Sig Dispense Refill  . aspirin EC 81 MG tablet Take 1 tablet (81 mg total) by mouth daily.  1 tablet  0  . pantoprazole (PROTONIX) 40 MG tablet Take 40 mg by mouth daily.      . trandolapril (MAVIK) 4 MG tablet Take 4 mg by mouth daily.        . verapamil (CALAN-SR) 240 MG CR tablet Take 240 mg by mouth 2 (two) times daily.       Marland Kitchen acetaminophen-codeine (TYLENOL #3) 300-30 MG per tablet Take 1 tablet by mouth every 6 (six) hours as needed (cough).  30 tablet  0  . [DISCONTINUED] omeprazole (PRILOSEC) 20 MG capsule Take 1 capsule (20 mg total) by mouth daily.  30 capsule  0   No current facility-administered medications on file prior to visit.     REVIEW OF SYSTEMS: Cardiovascular: No chest pain, chest pressure.  Positive for shortness of breath with exertion.  Positive for pain in legs with walking Pulmonary: No productive cough, asthma or wheezing. Neurologic: No weakness, paresthesias, aphasia, or amaurosis. No dizziness. Hematologic: No  bleeding problems or clotting disorders. Musculoskeletal: No joint pain or joint swelling. Gastrointestinal: No blood in stool or hematemesis Genitourinary: No dysuria or hematuria. Psychiatric:: No history of major depression. Integumentary: No rashes or ulcers. Constitutional: No fever or chills.  PHYSICAL EXAMINATION: General: The patient appears their stated age.  Vital signs are BP 148/59  Pulse 78  Ht 5\' 4"  (1.626 m)  Wt 185 lb (83.915 kg)  BMI 31.74 kg/m2  SpO2 100% HEENT:  Large thyroid goiter Pulmonary: Respirations are non-labored Abdomen: Soft and non-tender  Musculoskeletal: There are no major deformities.   Neurologic: No focal weakness or paresthesias are detected, Skin: There are no ulcer or rashes  noted. Psychiatric: The patient has normal affect. Cardiovascular: There is a regular rate and rhythm without significant murmur appreciated.  Bilateral carotid bruit  Diagnostic Studies: I have reviewed her outside ultrasound which reveals AV-99% stenosis on the right.  She has 40-59% stenosis of the left.  This study was repeated here which identifies greater than 80% stenosis on the right.  It is difficult to image her artery given the posterior location secondary to her goiter.  The bifurcation was felt to be high.   Assessment:  Asymptomatic right carotid stenosis Plan: Based on ultrasound imaging, I am not sure as to the extent of the disease distally.  The bifurcation was noted to be high.  I discussed these findings with the patient.  I have recommended getting a CT angiogram to better define her anatomy.  This will help me determine whether or not we should proceed with carotid stenting vs. carotid endarterectomy.  Her CT scan will be within the next week and she will followup afterwards.     Eldridge Abrahams, M.D. [facility] [place] Office: [phone] Pager:  [phone]

## 2013-12-18 NOTE — Addendum Note (Signed)
Addended by: Dorthula Rue L on: 12/18/2013 03:47 PM   Modules accepted: Orders

## 2013-12-29 ENCOUNTER — Encounter: Payer: Self-pay | Admitting: Vascular Surgery

## 2014-01-01 ENCOUNTER — Ambulatory Visit
Admission: RE | Admit: 2014-01-01 | Discharge: 2014-01-01 | Disposition: A | Discharge status: Home / Self Care | Payer: PRIVATE HEALTH INSURANCE | Source: Ambulatory Visit | Attending: Surgery | Admitting: Surgery

## 2014-01-01 ENCOUNTER — Ambulatory Visit (INDEPENDENT_AMBULATORY_CARE_PROVIDER_SITE_OTHER): Payer: PRIVATE HEALTH INSURANCE | Admitting: Surgery

## 2014-01-01 ENCOUNTER — Encounter: Payer: Self-pay | Admitting: Surgery

## 2014-01-01 VITALS — BP 180/88 | HR 81 | Ht 64.0 in | Wt 186.0 lb

## 2014-01-01 DIAGNOSIS — I6529 Occlusion and stenosis of unspecified carotid artery: Secondary | ICD-10-CM

## 2014-01-01 MED ORDER — IOHEXOL 350 MG/ML SOLN
100.0000 mL | Freq: Once | INTRAVENOUS | Status: AC | PRN
  Administered 2014-01-01: 100 mL via INTRAVENOUS

## 2014-01-01 NOTE — Progress Notes (Signed)
Patient name: Linda [name] MRN: [id number] DOB: July 12, 1936 Sex: female     Chief Complaint  Patient presents with  . Re-evaluation    1-2 wk f/u CTA neck prior    HISTORY OF PRESENT ILLNESS: The patient is back today for followup of her right carotid stenosis.  She recently had an ultrasound that showed greater than 80% right carotid stenosis.  She is asymptomatic.  Ultrasound was somewhat limited by large thyroid quarter.  There was felt to be kinking on the posterior distal aspect of the carotid artery.  Therefore I sent her for a CT and Gram to better define her anatomy.  She again is without new symptoms.  Past Medical History  Diagnosis Date  . Constipation   . Hypertension   . Arthritis   . Nail fungus   . Diverticulosis of colon (without mention of hemorrhage)   . Breast cancer   . Uterine fibroid   . GERD (gastroesophageal reflux disease)     Past Surgical History  Procedure Laterality Date  . Right lumpectomy with right sentinel lymph node dissection  2004    Dr. Bubba Camp  . Cholecystectomy  ~1980  . Vaginal hysterectomy      History   Social History  . Marital Status: Single    Spouse Name: N/A    Number of Children: 2  . Years of Education: N/A   Occupational History  . Retired    Social History Main Topics  . Smoking status: Current Every Day Smoker -- 0.50 packs/day    Types: Cigarettes  . Smokeless tobacco: Never Used     Comment: form given 12/14/11  . Alcohol Use: No  . Drug Use: No  . Sexual Activity: No   Other Topics Concern  . Not on file   Social History Narrative  . No narrative on file    Family History  Problem Relation Age of Onset  . Colon cancer Mother   . Colon cancer Maternal Grandmother     Allergies as of 01/01/2014  . (No Known Allergies)    Current Outpatient Prescriptions on File Prior to Visit  Medication Sig Dispense Refill  . acetaminophen-codeine (TYLENOL #3) 300-30 MG per tablet Take 1 tablet by mouth  every 6 (six) hours as needed (cough).  30 tablet  0  . aspirin EC 81 MG tablet Take 1 tablet (81 mg total) by mouth daily.  1 tablet  0  . pantoprazole (PROTONIX) 40 MG tablet Take 40 mg by mouth daily.      . trandolapril (MAVIK) 4 MG tablet Take 4 mg by mouth daily.        . verapamil (CALAN-SR) 240 MG CR tablet Take 240 mg by mouth 2 (two) times daily.       . [DISCONTINUED] omeprazole (PRILOSEC) 20 MG capsule Take 1 capsule (20 mg total) by mouth daily.  30 capsule  0   No current facility-administered medications on file prior to visit.     REVIEW OF SYSTEMS: No changes from prior visit  PHYSICAL EXAMINATION:   Vital signs are BP 180/88  Pulse 81  Ht 5\' 4"  (1.626 m)  Wt 186 lb (84.369 kg)  BMI 31.91 kg/m2  SpO2 100% General: The patient appears their stated age. HEENT:  No gross abnormalities Pulmonary:  Non labored breathing Musculoskeletal: There are no major deformities. Neurologic: No focal weakness or paresthesias are detected, Skin: There are no ulcer or rashes noted. Psychiatric: The patient has normal  affect.    Diagnostic Studies I have reviewed her CT angiogram.  This is greater than 80% right carotid stenosis.  The stenosis and bifurcation is in the mid neck region.  There is a large cheek within the artery approximately 4 cm distal to the bifurcation.  Assessment: Asymptomatic right carotid stenosis Plan: After reviewing the patient's CT angiogram, I feel that she is a better candidate for carotid endarterectomy.  I discussed the risks and benefits of surgery.  The risks include the risk of stroke and nerve injury as well as bleeding.  Her operation is been scheduled for Friday, February 13.  I will have to be careful about, its distal internal carotid artery is dissected out because of large kink there.  If the cheek is exposed she may require resection of a redundant carotid artery.  Eldridge Abrahams, M.D. [facility]  [place] Office: [phone] Pager:  [phone]

## 2014-01-03 ENCOUNTER — Other Ambulatory Visit: Payer: Self-pay | Admitting: *Deleted

## 2014-01-08 ENCOUNTER — Encounter (HOSPITAL_COMMUNITY): Payer: Self-pay | Admitting: Pharmacy Technician

## 2014-01-12 ENCOUNTER — Encounter (HOSPITAL_COMMUNITY): Payer: Self-pay

## 2014-01-12 ENCOUNTER — Encounter (HOSPITAL_COMMUNITY)
Admission: RE | Admit: 2014-01-12 | Discharge: 2014-01-12 | Disposition: A | Discharge status: Home / Self Care | Payer: PRIVATE HEALTH INSURANCE | Source: Ambulatory Visit | Attending: Surgery | Admitting: Surgery

## 2014-01-12 ENCOUNTER — Encounter (HOSPITAL_COMMUNITY)
Admission: RE | Admit: 2014-01-12 | Discharge: 2014-01-12 | Disposition: A | Discharge status: Home / Self Care | Payer: PRIVATE HEALTH INSURANCE | Source: Ambulatory Visit | Attending: Anesthesiology | Admitting: Anesthesiology

## 2014-01-12 DIAGNOSIS — Z01812 Encounter for preprocedural laboratory examination: Secondary | ICD-10-CM | POA: Insufficient documentation

## 2014-01-12 DIAGNOSIS — Z01818 Encounter for other preprocedural examination: Secondary | ICD-10-CM | POA: Insufficient documentation

## 2014-01-12 LAB — TYPE AND SCREEN
ABO/RH(D): B POS
Antibody Screen: NEGATIVE

## 2014-01-12 LAB — CBC
HCT: 43.5 % (ref 36.0–46.0)
Hemoglobin: 14.6 g/dL (ref 12.0–15.0)
MCH: 27.8 pg (ref 26.0–34.0)
MCHC: 33.6 g/dL (ref 30.0–36.0)
MCV: 82.9 fL (ref 78.0–100.0)
PLATELETS: 200 10*3/uL (ref 150–400)
RBC: 5.25 MIL/uL — ABNORMAL HIGH (ref 3.87–5.11)
RDW: 14.2 % (ref 11.5–15.5)
WBC: 8.2 10*3/uL (ref 4.0–10.5)

## 2014-01-12 LAB — URINALYSIS, ROUTINE W REFLEX MICROSCOPIC
Bilirubin Urine: NEGATIVE
GLUCOSE, UA: NEGATIVE mg/dL
Hgb urine dipstick: NEGATIVE
KETONES UR: NEGATIVE mg/dL
Nitrite: NEGATIVE
Protein, ur: 100 mg/dL — AB
Specific Gravity, Urine: 1.016 (ref 1.005–1.030)
Urobilinogen, UA: 1 mg/dL (ref 0.0–1.0)
pH: 6 (ref 5.0–8.0)

## 2014-01-12 LAB — COMPREHENSIVE METABOLIC PANEL
ALT: 9 U/L (ref 0–35)
AST: 13 U/L (ref 0–37)
Albumin: 3.5 g/dL (ref 3.5–5.2)
Alkaline Phosphatase: 86 U/L (ref 39–117)
BUN: 17 mg/dL (ref 6–23)
CALCIUM: 9.1 mg/dL (ref 8.4–10.5)
CO2: 25 mEq/L (ref 19–32)
Chloride: 103 mEq/L (ref 96–112)
Creatinine, Ser: 0.89 mg/dL (ref 0.50–1.10)
GFR calc Af Amer: 72 mL/min — ABNORMAL LOW (ref >90)
GFR, EST NON AFRICAN AMERICAN: 62 mL/min — AB (ref >90)
GLUCOSE: 95 mg/dL (ref 70–99)
Potassium: 4 mEq/L (ref 3.7–5.3)
Sodium: 141 mEq/L (ref 137–147)
TOTAL PROTEIN: 7.8 g/dL (ref 6.0–8.3)
Total Bilirubin: 0.2 mg/dL — ABNORMAL LOW (ref 0.3–1.2)

## 2014-01-12 LAB — URINE MICROSCOPIC-ADD ON

## 2014-01-12 LAB — PROTIME-INR
INR: 1.04 (ref 0.00–1.49)
Prothrombin Time: 13.4 seconds (ref 11.6–15.2)

## 2014-01-12 LAB — APTT: aPTT: 31 seconds (ref 24–37)

## 2014-01-12 LAB — SURGICAL PCR SCREEN
MRSA, PCR: NEGATIVE
STAPHYLOCOCCUS AUREUS: NEGATIVE

## 2014-01-12 LAB — ABO/RH: ABO/RH(D): B POS

## 2014-01-12 NOTE — Progress Notes (Signed)
Pt denies SOB, and chest pain but is under the care of Dr. Burt Knack at [facility] for cardiology.

## 2014-01-12 NOTE — Pre-Procedure Instructions (Signed)
Linda [name]  [birth year]   Your procedure is scheduled on:  Friday, January 19, 2014 at 7:15 AM  Report to [facility] (use Main Entrance "A'') at 5:30 AM.  Call this number if you have problems the morning of surgery: [phone]   Remember:   Do not eat food or drink liquids after midnight.   Take these medicines the morning of surgery with A SIP OF WATER: aspirin EC 81 MG tablet, pantoprazole (PROTONIX) 40 MG tablet,  verapamil (CALAN-SR) 240 MG CR tablet Stop taking vitamins and herbal medications. Do not take any NSAIDs ie: Ibuprofen, Advil, Naproxen and etc. 5 days prior to procedure.  Do not wear jewelry, make-up or nail polish.  Do not wear lotions, powders, or perfumes.   Do not shave 48 hours prior to surgery.   Do not bring valuables to the hospital.  [facility] is not responsible for any belongings or valuables.               Contacts, dentures or bridgework may not be worn into surgery.  Leave suitcase in the car. After surgery it may be brought to your room.  For patients admitted to the hospital, discharge time is determined by your treatment team.               Patients discharged the day of surgery will not be allowed to drive home.  Name and phone number of your driver:   Special Instructions: [place] - Preparing for Surgery  Before surgery, you can play an important role.  Because skin is not sterile, your skin needs to be as free of germs as possible.  You can reduce the number of germs on you skin by washing with CHG (chlorahexidine gluconate) soap before surgery.  CHG is an antiseptic cleaner which kills germs and bonds with the skin to continue killing germs even after washing.  Please DO NOT use if you have an allergy to CHG or antibacterial soaps.  If your skin becomes reddened/irritated stop using the CHG and inform your nurse when you arrive at Short Stay.  Do not shave (including legs and underarms) for at least 48 hours prior to the first  CHG shower.  You may shave your face.  Please follow these instructions carefully:   1.  Shower with CHG Soap the night before surgery and the morning of Surgery.  2.  If you choose to wash your hair, wash your hair first as usual with your normal shampoo.  3.  After you shampoo, rinse your hair and body thoroughly to remove the Shampoo.  4.  Use CHG as you would any other liquid soap.  You can apply chg directly to the skin and wash gently with scrungie or a clean washcloth.  5.  Apply the CHG Soap to your body ONLY FROM THE NECK DOWN.  Do not use on open wounds or open sores.  Avoid contact with your eyes, ears, mouth and genitals (private parts).  Wash genitals (private parts) with your normal soap.  6.  Wash thoroughly, paying special attention to the area where your surgery will be performed.  7.  Thoroughly rinse your body with warm water from the neck down.  8.  DO NOT shower/wash with your normal soap after using and rinsing off the CHG Soap.  9.  Pat yourself dry with a clean towel.            10.  Wear clean pajamas.  11.  Place clean sheets on your bed the night of your first shower and do not sleep with pets.  Day of Surgery  Do not apply any lotions/deodorants the morning of surgery.  Please wear clean clothes to the hospital/surgery center.     Please read over the following fact sheets that you were given: Pain Booklet, Coughing and Deep Breathing, Blood Transfusion Information, MRSA Information and Surgical Site Infection Prevention

## 2014-01-13 LAB — URINE CULTURE
CULTURE: NO GROWTH
Colony Count: NO GROWTH

## 2014-01-15 NOTE — Progress Notes (Addendum)
Anesthesia Chart Review:  Patient is a 78 year old female scheduled for right CEA on 01/19/14 by Dr. Trula Slade.  History includes HTN, breast cancer s/p right lumpectomy '04, GERD, smoking, hysterectomy, diverticulosis, PAD with occlusion of distal right SFA and severe left SFA stensos (followed by cardiologist Dr. Burt Knack). She also was noted to have large goiter by 11/14/13 carotid duplex showing 26-20% RICS, 35-59% LICAS. PCP is listed as Dr. Ricke Hey.  EKG on 11/08/13 showed SR with first degree AVB, low voltage QRS.  She denied prior stress or cath. She had an echo on 04/06/03 (pre-chemo) that showed: Severe basal septal hypertrophy with HOCM physiology. Poor image quality makes SAM hard to visualize. LVOT gradient hard to quantitate but peak velocity probably in the 2-3m/sec range. EF 55-65%. Mild-moderately calcified AV. Mild MR. LA mildly dilated.    CTA of the neck on 01/01/14 showed: 1. Severe stenosis of the proximal right internal carotid artery.  2. Moderate atherosclerosis of the proximal left internal carotid artery without evidence of significant stenosis.  3. Approximately 60% stenosis of the proximal left subclavian artery.  4. At least moderate stenosis of the left vertebral artery near its origin. Mild narrowing of the right vertebral artery origin.  5. Heterogeneous, markedly enlarged thyroid gland extending into the superior mediastinum, consistent with history of goiter.   Preoperative CXR and labs noted.   Above reviewed with anesthesiologist Dr. Conrad Milford with request to have Dr. Burt Knack review for further recommendations.  Message sent to Dr. Burt Knack in [place].  Will follow-up once I hear back from him.  George Hugh [facility] Short Stay Center/Anesthesiology Phone [phone] 01/15/2014 2:18 PM  Addendum: 01/16/2014 12:00 PM I spoke with Dr. Burt Knack today. He had received my message from yesterday.  He will review chart further to determine if he feels an echo is  warranted preoperatively. (Update: See Dr. Antionette Char progress note from 01/17/14.  Plan to proceed.)

## 2014-01-17 ENCOUNTER — Encounter: Payer: Self-pay | Admitting: Cardiovascular Disease

## 2014-01-17 NOTE — Progress Notes (Unsigned)
I was asked to comment on Linda [name]'s cardiac risk of upcoming carotid endarterectomy. I just saw her in the office in December 2014 and she did not complain of any cardiopulmonary symptoms. Review of old echo from 2004 shows normal LV systolic function, moderate LVH, and basal septal hypertrophy with an LVOT gradient.   In absence of cardiac symptoms, I think she can proceed with surgery at low cardiac risk without further testing. Her EKG from November 08, 2013 shows sinus rhythm without significant ST-T changes and she has no hx of heart failure, arrhythmia, or angina.  Our service will be available if she has any problems related to surgery.   [name] 01/17/2014 1:20 PM

## 2014-01-18 MED ORDER — DEXTROSE 5 % IV SOLN
1.5000 g | INTRAVENOUS | Status: AC
  Administered 2014-01-19: 1.5 g via INTRAVENOUS
  Filled 2014-01-18: qty 1.5

## 2014-01-19 ENCOUNTER — Inpatient Hospital Stay (HOSPITAL_COMMUNITY): Payer: PRIVATE HEALTH INSURANCE | Admitting: Certified Registered Nurse Anesthetist

## 2014-01-19 ENCOUNTER — Encounter (HOSPITAL_COMMUNITY): Payer: Self-pay | Admitting: *Deleted

## 2014-01-19 ENCOUNTER — Encounter (HOSPITAL_COMMUNITY)
Admission: RE | Disposition: A | Discharge status: Home / Self Care | Payer: Self-pay | Source: Ambulatory Visit | Attending: Surgery

## 2014-01-19 ENCOUNTER — Inpatient Hospital Stay (HOSPITAL_COMMUNITY)
Admission: RE | Admit: 2014-01-19 | Discharge: 2014-01-20 | DRG: 038 | Disposition: A | Discharge status: Home / Self Care | Payer: PRIVATE HEALTH INSURANCE | Source: Ambulatory Visit | Attending: Surgery | Admitting: Surgery

## 2014-01-19 ENCOUNTER — Encounter (HOSPITAL_COMMUNITY): Payer: PRIVATE HEALTH INSURANCE | Admitting: Vascular Surgery

## 2014-01-19 DIAGNOSIS — F17200 Nicotine dependence, unspecified, uncomplicated: Secondary | ICD-10-CM | POA: Diagnosis present

## 2014-01-19 DIAGNOSIS — I252 Old myocardial infarction: Secondary | ICD-10-CM

## 2014-01-19 DIAGNOSIS — Z79899 Other long term (current) drug therapy: Secondary | ICD-10-CM | POA: Diagnosis not present

## 2014-01-19 DIAGNOSIS — I6529 Occlusion and stenosis of unspecified carotid artery: Secondary | ICD-10-CM | POA: Diagnosis present

## 2014-01-19 DIAGNOSIS — I10 Essential (primary) hypertension: Secondary | ICD-10-CM | POA: Diagnosis present

## 2014-01-19 DIAGNOSIS — I2510 Atherosclerotic heart disease of native coronary artery without angina pectoris: Secondary | ICD-10-CM | POA: Diagnosis present

## 2014-01-19 DIAGNOSIS — F411 Generalized anxiety disorder: Secondary | ICD-10-CM | POA: Diagnosis present

## 2014-01-19 DIAGNOSIS — Z7982 Long term (current) use of aspirin: Secondary | ICD-10-CM

## 2014-01-19 DIAGNOSIS — G4730 Sleep apnea, unspecified: Secondary | ICD-10-CM | POA: Diagnosis present

## 2014-01-19 DIAGNOSIS — Z853 Personal history of malignant neoplasm of breast: Secondary | ICD-10-CM

## 2014-01-19 DIAGNOSIS — E049 Nontoxic goiter, unspecified: Secondary | ICD-10-CM | POA: Diagnosis present

## 2014-01-19 DIAGNOSIS — K219 Gastro-esophageal reflux disease without esophagitis: Secondary | ICD-10-CM | POA: Diagnosis present

## 2014-01-19 DIAGNOSIS — M129 Arthropathy, unspecified: Secondary | ICD-10-CM | POA: Diagnosis present

## 2014-01-19 DIAGNOSIS — I421 Obstructive hypertrophic cardiomyopathy: Secondary | ICD-10-CM | POA: Diagnosis present

## 2014-01-19 DIAGNOSIS — I509 Heart failure, unspecified: Secondary | ICD-10-CM | POA: Diagnosis present

## 2014-01-19 HISTORY — PX: ENDARTERECTOMY: SHX5162

## 2014-01-19 LAB — CBC
HCT: 39 % (ref 36.0–46.0)
Hemoglobin: 12.8 g/dL (ref 12.0–15.0)
MCH: 27.4 pg (ref 26.0–34.0)
MCHC: 32.8 g/dL (ref 30.0–36.0)
MCV: 83.3 fL (ref 78.0–100.0)
PLATELETS: 166 10*3/uL (ref 150–400)
RBC: 4.68 MIL/uL (ref 3.87–5.11)
RDW: 14.6 % (ref 11.5–15.5)
WBC: 10.5 10*3/uL (ref 4.0–10.5)

## 2014-01-19 LAB — CREATININE, SERUM
Creatinine, Ser: 0.82 mg/dL (ref 0.50–1.10)
GFR calc Af Amer: 79 mL/min — ABNORMAL LOW (ref >90)
GFR calc non Af Amer: 68 mL/min — ABNORMAL LOW (ref >90)

## 2014-01-19 SURGERY — ENDARTERECTOMY, CAROTID
Anesthesia: General | Site: Neck | Laterality: Right

## 2014-01-19 MED ORDER — ASPIRIN EC 81 MG PO TBEC
81.0000 mg | DELAYED_RELEASE_TABLET | Freq: Every day | ORAL | Status: DC
  Filled 2014-01-19 (×2): qty 1

## 2014-01-19 MED ORDER — FENTANYL CITRATE 0.05 MG/ML IJ SOLN
INTRAMUSCULAR | Status: AC
  Filled 2014-01-19: qty 5

## 2014-01-19 MED ORDER — ARTIFICIAL TEARS OP OINT
TOPICAL_OINTMENT | OPHTHALMIC | Status: DC | PRN
  Administered 2014-01-19: 1 via OPHTHALMIC

## 2014-01-19 MED ORDER — FENTANYL CITRATE 0.05 MG/ML IJ SOLN
25.0000 ug | INTRAMUSCULAR | Status: DC | PRN
Start: 2014-01-19 — End: 2014-01-19

## 2014-01-19 MED ORDER — OXYCODONE-ACETAMINOPHEN 5-325 MG PO TABS: 1.0000 | ORAL_TABLET | ORAL | Status: DC | PRN

## 2014-01-19 MED ORDER — GUAIFENESIN-DM 100-10 MG/5ML PO SYRP: 15.0000 mL | ORAL_SOLUTION | ORAL | Status: DC | PRN

## 2014-01-19 MED ORDER — ONDANSETRON HCL 4 MG/2ML IJ SOLN
4.0000 mg | Freq: Four times a day (QID) | INTRAMUSCULAR | Status: DC | PRN
  Administered 2014-01-19: 4 mg via INTRAVENOUS
  Filled 2014-01-19: qty 2

## 2014-01-19 MED ORDER — BISACODYL 10 MG RE SUPP
10.0000 mg | Freq: Every day | RECTAL | Status: DC | PRN
Start: 2014-01-19 — End: 2014-01-20

## 2014-01-19 MED ORDER — VERAPAMIL HCL ER 240 MG PO TBCR
240.0000 mg | EXTENDED_RELEASE_TABLET | Freq: Two times a day (BID) | ORAL | Status: DC
  Administered 2014-01-19: 240 mg via ORAL
  Filled 2014-01-19 (×3): qty 1

## 2014-01-19 MED ORDER — HEPARIN SODIUM (PORCINE) 1000 UNIT/ML IJ SOLN
INTRAMUSCULAR | Status: AC
  Filled 2014-01-19: qty 1

## 2014-01-19 MED ORDER — ONDANSETRON HCL 4 MG/2ML IJ SOLN
INTRAMUSCULAR | Status: DC | PRN
  Administered 2014-01-19: 4 mg via INTRAVENOUS

## 2014-01-19 MED ORDER — MAGNESIUM SULFATE 40 MG/ML IJ SOLN: 2.0000 g | Freq: Every day | INTRAMUSCULAR | Status: DC | PRN

## 2014-01-19 MED ORDER — ENOXAPARIN SODIUM 40 MG/0.4ML ~~LOC~~ SOLN
40.0000 mg | SUBCUTANEOUS | Status: DC
  Administered 2014-01-20: 40 mg via SUBCUTANEOUS
  Filled 2014-01-19 (×2): qty 0.4

## 2014-01-19 MED ORDER — LIDOCAINE HCL (PF) 1 % IJ SOLN
INTRAMUSCULAR | Status: AC
  Filled 2014-01-19: qty 30

## 2014-01-19 MED ORDER — MIDAZOLAM HCL 2 MG/2ML IJ SOLN
INTRAMUSCULAR | Status: AC
  Filled 2014-01-19: qty 2

## 2014-01-19 MED ORDER — HEMOSTATIC AGENTS (NO CHARGE) OPTIME
TOPICAL | Status: DC | PRN
  Administered 2014-01-19: 1 via TOPICAL

## 2014-01-19 MED ORDER — PHENYLEPHRINE HCL 10 MG/ML IJ SOLN
10.0000 mg | INTRAVENOUS | Status: DC | PRN
  Administered 2014-01-19: 30 ug/min via INTRAVENOUS

## 2014-01-19 MED ORDER — ONDANSETRON HCL 4 MG/2ML IJ SOLN
INTRAMUSCULAR | Status: AC
  Filled 2014-01-19: qty 2

## 2014-01-19 MED ORDER — GLYCOPYRROLATE 0.2 MG/ML IJ SOLN
INTRAMUSCULAR | Status: AC
  Filled 2014-01-19: qty 4

## 2014-01-19 MED ORDER — GLYCOPYRROLATE 0.2 MG/ML IJ SOLN
INTRAMUSCULAR | Status: DC | PRN
  Administered 2014-01-19: .8 mg via INTRAVENOUS

## 2014-01-19 MED ORDER — LACTATED RINGERS IV SOLN
INTRAVENOUS | Status: DC | PRN
  Administered 2014-01-19 (×2): via INTRAVENOUS

## 2014-01-19 MED ORDER — 0.9 % SODIUM CHLORIDE (POUR BTL) OPTIME
TOPICAL | Status: DC | PRN
Start: 2014-01-19 — End: 2014-01-19
  Administered 2014-01-19: 1000 mL

## 2014-01-19 MED ORDER — PROTAMINE SULFATE 10 MG/ML IV SOLN
INTRAVENOUS | Status: DC | PRN
  Administered 2014-01-19: 10 mg via INTRAVENOUS
  Administered 2014-01-19: 20 mg via INTRAVENOUS
  Administered 2014-01-19 (×2): 10 mg via INTRAVENOUS

## 2014-01-19 MED ORDER — NEOSTIGMINE METHYLSULFATE 1 MG/ML IJ SOLN
INTRAMUSCULAR | Status: DC | PRN
  Administered 2014-01-19: 5 mg via INTRAVENOUS

## 2014-01-19 MED ORDER — ACETAMINOPHEN 160 MG/5ML PO SOLN: 325.0000 mg | ORAL | Status: DC | PRN

## 2014-01-19 MED ORDER — SODIUM CHLORIDE 0.9 % IV SOLN: INTRAVENOUS | Status: DC

## 2014-01-19 MED ORDER — METOPROLOL TARTRATE 1 MG/ML IV SOLN: 2.0000 mg | INTRAVENOUS | Status: DC | PRN

## 2014-01-19 MED ORDER — LABETALOL HCL 5 MG/ML IV SOLN
INTRAVENOUS | Status: DC | PRN
  Administered 2014-01-19: 10 mg via INTRAVENOUS
  Administered 2014-01-19: 2.5 mg via INTRAVENOUS
  Administered 2014-01-19 (×3): 5 mg via INTRAVENOUS
  Administered 2014-01-19: 2.5 mg via INTRAVENOUS
  Administered 2014-01-19 (×2): 5 mg via INTRAVENOUS
  Administered 2014-01-19: 2.5 mg via INTRAVENOUS
  Administered 2014-01-19: 5 mg via INTRAVENOUS
  Administered 2014-01-19: 2.5 mg via INTRAVENOUS
  Administered 2014-01-19: 5 mg via INTRAVENOUS
  Administered 2014-01-19 (×2): 2.5 mg via INTRAVENOUS
  Administered 2014-01-19: 10 mg via INTRAVENOUS

## 2014-01-19 MED ORDER — SODIUM CHLORIDE 0.9 % IR SOLN
Status: DC | PRN
  Administered 2014-01-19: 07:00:00

## 2014-01-19 MED ORDER — ACETAMINOPHEN 325 MG PO TABS: 325.0000 mg | ORAL_TABLET | ORAL | Status: DC | PRN

## 2014-01-19 MED ORDER — HEPARIN SODIUM (PORCINE) 1000 UNIT/ML IJ SOLN
INTRAMUSCULAR | Status: DC | PRN
  Administered 2014-01-19: 1000 [IU] via INTRAVENOUS
  Administered 2014-01-19: 7000 [IU] via INTRAVENOUS

## 2014-01-19 MED ORDER — PHENYLEPHRINE HCL 10 MG/ML IJ SOLN
INTRAMUSCULAR | Status: DC | PRN
  Administered 2014-01-19: 80 ug via INTRAVENOUS
  Administered 2014-01-19 (×4): 40 ug via INTRAVENOUS

## 2014-01-19 MED ORDER — ALUM & MAG HYDROXIDE-SIMETH 200-200-20 MG/5ML PO SUSP: 15.0000 mL | ORAL | Status: DC | PRN

## 2014-01-19 MED ORDER — SODIUM CHLORIDE 0.9 % IV SOLN: 500.0000 mL | Freq: Once | INTRAVENOUS | Status: AC | PRN

## 2014-01-19 MED ORDER — TRANDOLAPRIL 4 MG PO TABS
4.0000 mg | ORAL_TABLET | Freq: Every day | ORAL | Status: DC
  Administered 2014-01-19: 4 mg via ORAL
  Filled 2014-01-19 (×2): qty 1

## 2014-01-19 MED ORDER — LIDOCAINE HCL (CARDIAC) 20 MG/ML IV SOLN
INTRAVENOUS | Status: DC | PRN
  Administered 2014-01-19: 60 mg via INTRAVENOUS

## 2014-01-19 MED ORDER — PROTAMINE SULFATE 10 MG/ML IV SOLN
INTRAVENOUS | Status: AC
  Filled 2014-01-19: qty 5

## 2014-01-19 MED ORDER — LABETALOL HCL 5 MG/ML IV SOLN
INTRAVENOUS | Status: AC
  Filled 2014-01-19: qty 4

## 2014-01-19 MED ORDER — PROPOFOL 10 MG/ML IV BOLUS
INTRAVENOUS | Status: DC | PRN
  Administered 2014-01-19: 100 mg via INTRAVENOUS
  Administered 2014-01-19: 50 mg via INTRAVENOUS

## 2014-01-19 MED ORDER — FENTANYL CITRATE 0.05 MG/ML IJ SOLN
INTRAMUSCULAR | Status: DC | PRN
  Administered 2014-01-19: 25 ug via INTRAVENOUS
  Administered 2014-01-19: 50 ug via INTRAVENOUS
  Administered 2014-01-19 (×2): 100 ug via INTRAVENOUS
  Administered 2014-01-19 (×2): 25 ug via INTRAVENOUS
  Administered 2014-01-19: 50 ug via INTRAVENOUS
  Administered 2014-01-19 (×2): 25 ug via INTRAVENOUS

## 2014-01-19 MED ORDER — HYDRALAZINE HCL 20 MG/ML IJ SOLN: 10.0000 mg | INTRAMUSCULAR | Status: DC | PRN

## 2014-01-19 MED ORDER — DOPAMINE-DEXTROSE 3.2-5 MG/ML-% IV SOLN: 3.0000 ug/kg/min | INTRAVENOUS | Status: DC

## 2014-01-19 MED ORDER — ROCURONIUM BROMIDE 100 MG/10ML IV SOLN
INTRAVENOUS | Status: DC | PRN
  Administered 2014-01-19: 10 mg via INTRAVENOUS
  Administered 2014-01-19: 40 mg via INTRAVENOUS
  Administered 2014-01-19: 10 mg via INTRAVENOUS

## 2014-01-19 MED ORDER — OXYCODONE HCL 5 MG PO TABS: 5.0000 mg | ORAL_TABLET | Freq: Once | ORAL | Status: DC | PRN

## 2014-01-19 MED ORDER — PROPOFOL 10 MG/ML IV BOLUS
INTRAVENOUS | Status: AC
  Filled 2014-01-19: qty 20

## 2014-01-19 MED ORDER — DEXTROSE 5 % IV SOLN
1.5000 g | Freq: Two times a day (BID) | INTRAVENOUS | Status: AC
  Administered 2014-01-19 – 2014-01-20 (×2): 1.5 g via INTRAVENOUS
  Filled 2014-01-19 (×2): qty 1.5

## 2014-01-19 MED ORDER — OXYCODONE HCL 5 MG/5ML PO SOLN
5.0000 mg | Freq: Once | ORAL | Status: DC | PRN
Start: 2014-01-19 — End: 2014-01-19

## 2014-01-19 MED ORDER — POTASSIUM CHLORIDE CRYS ER 20 MEQ PO TBCR: 20.0000 meq | EXTENDED_RELEASE_TABLET | Freq: Every day | ORAL | Status: DC | PRN

## 2014-01-19 MED ORDER — NEOSTIGMINE METHYLSULFATE 1 MG/ML IJ SOLN
INTRAMUSCULAR | Status: AC
  Filled 2014-01-19: qty 10

## 2014-01-19 MED ORDER — ROCURONIUM BROMIDE 50 MG/5ML IV SOLN
INTRAVENOUS | Status: AC
Start: 2014-01-19 — End: 2014-01-19
  Filled 2014-01-19: qty 1

## 2014-01-19 MED ORDER — LIDOCAINE HCL (CARDIAC) 20 MG/ML IV SOLN
INTRAVENOUS | Status: AC
  Filled 2014-01-19: qty 5

## 2014-01-19 MED ORDER — DOCUSATE SODIUM 100 MG PO CAPS: 100.0000 mg | ORAL_CAPSULE | Freq: Every day | ORAL | Status: DC

## 2014-01-19 MED ORDER — ACETAMINOPHEN 650 MG RE SUPP: 325.0000 mg | RECTAL | Status: DC | PRN

## 2014-01-19 MED ORDER — PANTOPRAZOLE SODIUM 40 MG PO TBEC
40.0000 mg | DELAYED_RELEASE_TABLET | Freq: Every day | ORAL | Status: DC
  Administered 2014-01-19: 40 mg via ORAL

## 2014-01-19 MED ORDER — LABETALOL HCL 5 MG/ML IV SOLN: 10.0000 mg | INTRAVENOUS | Status: DC | PRN

## 2014-01-19 MED ORDER — SENNOSIDES-DOCUSATE SODIUM 8.6-50 MG PO TABS
1.0000 | ORAL_TABLET | Freq: Every evening | ORAL | Status: DC | PRN
  Filled 2014-01-19: qty 1

## 2014-01-19 MED ORDER — ROCURONIUM BROMIDE 50 MG/5ML IV SOLN
INTRAVENOUS | Status: AC
  Filled 2014-01-19: qty 1

## 2014-01-19 MED ORDER — ONDANSETRON HCL 4 MG/2ML IJ SOLN: 4.0000 mg | Freq: Once | INTRAMUSCULAR | Status: DC | PRN

## 2014-01-19 MED ORDER — OXYCODONE-ACETAMINOPHEN 5-325 MG PO TABS: 1.0000 | ORAL_TABLET | Freq: Four times a day (QID) | ORAL | Status: DC | PRN

## 2014-01-19 MED ORDER — PHENOL 1.4 % MT LIQD
1.0000 | OROMUCOSAL | Status: DC | PRN
Start: 2014-01-19 — End: 2014-01-20

## 2014-01-19 SURGICAL SUPPLY — 49 items
CANISTER SUCTION 2500CC (MISCELLANEOUS) ×3 IMPLANT
CATH ROBINSON RED A/P 18FR (CATHETERS) ×3 IMPLANT
CATH SUCT 10FR WHISTLE TIP (CATHETERS) ×3 IMPLANT
CLIP TI MEDIUM 6 (CLIP) ×3 IMPLANT
CLIP TI WIDE RED SMALL 6 (CLIP) ×9 IMPLANT
COVER SURGICAL LIGHT HANDLE (MISCELLANEOUS) ×3 IMPLANT
CRADLE DONUT ADULT HEAD (MISCELLANEOUS) ×3 IMPLANT
DERMABOND ADVANCED (GAUZE/BANDAGES/DRESSINGS) ×2
DERMABOND ADVANCED .7 DNX12 (GAUZE/BANDAGES/DRESSINGS) ×1 IMPLANT
DRAIN CHANNEL 15F RND FF W/TCR (WOUND CARE) IMPLANT
DRAPE WARM FLUID 44X44 (DRAPE) ×3 IMPLANT
ELECT REM PT RETURN 9FT ADLT (ELECTROSURGICAL) ×3
ELECTRODE REM PT RTRN 9FT ADLT (ELECTROSURGICAL) ×1 IMPLANT
EVACUATOR SILICONE 100CC (DRAIN) IMPLANT
GLOVE BIOGEL PI IND STRL 7.5 (GLOVE) ×6 IMPLANT
GLOVE BIOGEL PI INDICATOR 7.5 (GLOVE) ×12
GLOVE ECLIPSE 7.5 STRL STRAW (GLOVE) ×3 IMPLANT
GLOVE SKINSENSE NS SZ7.0 (GLOVE) ×2
GLOVE SKINSENSE STRL SZ7.0 (GLOVE) ×1 IMPLANT
GLOVE SS BIOGEL STRL SZ 7 (GLOVE) ×1 IMPLANT
GLOVE SUPERSENSE BIOGEL SZ 7 (GLOVE) ×2
GLOVE SURG SS PI 7.5 STRL IVOR (GLOVE) ×3 IMPLANT
GOWN STRL REUS W/ TWL LRG LVL3 (GOWN DISPOSABLE) ×2 IMPLANT
GOWN STRL REUS W/ TWL XL LVL3 (GOWN DISPOSABLE) ×4 IMPLANT
GOWN STRL REUS W/TWL LRG LVL3 (GOWN DISPOSABLE) ×4
GOWN STRL REUS W/TWL XL LVL3 (GOWN DISPOSABLE) ×8
HEMOSTAT SNOW SURGICEL 2X4 (HEMOSTASIS) ×3 IMPLANT
INSERT FOGARTY SM (MISCELLANEOUS) IMPLANT
KIT BASIN OR (CUSTOM PROCEDURE TRAY) ×3 IMPLANT
KIT ROOM TURNOVER OR (KITS) ×3 IMPLANT
NS IRRIG 1000ML POUR BTL (IV SOLUTION) ×9 IMPLANT
PACK CAROTID (CUSTOM PROCEDURE TRAY) ×3 IMPLANT
PAD ARMBOARD 7.5X6 YLW CONV (MISCELLANEOUS) ×6 IMPLANT
PATCH VASCULAR VASCU GUARD 1X6 (Vascular Products) ×3 IMPLANT
SHUNT CAROTID BYPASS 10 (VASCULAR PRODUCTS) IMPLANT
SHUNT CAROTID BYPASS 12FRX15.5 (VASCULAR PRODUCTS) IMPLANT
SPONGE INTESTINAL PEANUT (DISPOSABLE) ×3 IMPLANT
SUT ETHILON 3 0 PS 1 (SUTURE) IMPLANT
SUT PROLENE 6 0 BV (SUTURE) ×9 IMPLANT
SUT PROLENE 7 0 BV 1 (SUTURE) ×3 IMPLANT
SUT PROLENE 7 0 BV1 MDA (SUTURE) ×3 IMPLANT
SUT SILK 3 0 TIES 17X18 (SUTURE)
SUT SILK 3-0 18XBRD TIE BLK (SUTURE) IMPLANT
SUT VIC AB 3-0 SH 27 (SUTURE) ×4
SUT VIC AB 3-0 SH 27X BRD (SUTURE) ×2 IMPLANT
SUT VICRYL 4-0 PS2 18IN ABS (SUTURE) ×3 IMPLANT
TOWEL OR 17X24 6PK STRL BLUE (TOWEL DISPOSABLE) ×3 IMPLANT
TOWEL OR 17X26 10 PK STRL BLUE (TOWEL DISPOSABLE) ×3 IMPLANT
WATER STERILE IRR 1000ML POUR (IV SOLUTION) ×3 IMPLANT

## 2014-01-19 NOTE — Progress Notes (Signed)
I came to evaluate the patient again in the recovery room.  She was still very lethargic.  She did have pinpoint pupils.  I was not satisfied with her ability to move her extremities, particularly her left side.  I ordered a duplex ultrasound which showed no significant right carotid artery stenosis.  With more time elapsing, the patient began to wake up.  The next time I evaluated her, she was still lethargic, however she was moving all 4 extremities to command.  She had good grip strength in the left hand as well as the right.  She could lift both of her feet up off of the bed.  Her tongue was midline.  Her smile was symmetric.  For now, routine postoperative care will be continued.  Linda [name]

## 2014-01-19 NOTE — Progress Notes (Signed)
Right carotid artery duplex:  1-39% ICA stenosis.  Vertebral artery flow is antegrade.

## 2014-01-19 NOTE — Preoperative (Signed)
Beta Blockers   Reason not to administer Beta Blockers:Not Applicable 

## 2014-01-19 NOTE — Transfer of Care (Signed)
Immediate Anesthesia Transfer of Care Note  Patient: Linda [name]  Procedure(s) Performed: Procedure(s): ENDARTERECTOMY CAROTID (Right)  Patient Location: PACU  Anesthesia Type:General  Level of Consciousness: awake and alert   Airway & Oxygen Therapy: Patient Spontanous Breathing and Patient connected to face mask oxygen  Post-op Assessment: Report given to PACU RN, Post -op Vital signs reviewed and stable and Patient moving all extremities X 4  Post vital signs: Reviewed and stable  Complications: No apparent anesthesia complications

## 2014-01-19 NOTE — Op Note (Signed)
Patient name: Linda [name] MRN: [id number] DOB: 07/11/1936 Sex: female  01/19/2014 Pre-operative Diagnosis: Asymptomatic   right carotid stenosis Post-operative diagnosis:  Same Surgeon:  Eldridge Abrahams Assistants:  Lennie Muckle Procedure:    right carotid Endarterectomy with bovine pericardial  patch angioplasty   #2:  resection of right internal carotid artery with primary anastomosis Anesthesia:  General Blood Loss:  See anesthesia record Specimens:  Carotid Plaque to pathology  Findings:  90 %stenosis; Thrombus:  None  Indications:  The patient was found by both ultrasound and CT angiogram to have a high-grade right carotid stenosis.  She has a large goiter as well as a kink within her right internal carotid artery.  Because of the kink, I did not feel that she was a good candidate for stenting therefore I recommended endarterectomy.  Discussed that moving the thyroid out of the way would be challenging but I felt it could be accomplished.  Procedure:  The patient was identified in the holding area and taken to [facility]  The patient was then placed supine on the table.   General endotrachial anesthesia was administered.  The patient was prepped and draped in the usual sterile fashion.  A time out was called and antibiotics were administered.  The incision was made along the anterior border of the right sternocleidomastoid muscle.  Cautery was used to dissect through the subcutaneous tissue.  The platysma muscle was divided with cautery.  The internal jugular vein was exposed along its anterior medial border.  The common facial vein was exposed and then divided between 2-0 silk ties and metal clips.  The common carotid artery was then circumferentially exposed and encircled with an umbilical tape.  The vagus nerve was identified and protected.  Next sharp dissection was used to expose the external carotid artery and the superior thyroid artery.  The were encircled with a blue vessel  loop and a 2-0 silk tie respectively.  Finally, the internal carotid was carefully dissected free.  An umbilical tape was placed around the internal carotid artery distal to the diseased segment.  The hypoglossal nerve was visualized throughout and protected.  The patient was given systemic heparinization.  A bovine carotid patch was selected and prepared on the back table.  A 10 french shunt was also prepared.  After blood pressure readings were appropriate and the heparin had been given time to circulate, the internal carotid artery was occluded with a baby Gregory clamp.  The external and common carotid arteries were then occluded with vascular clamps and the 2-0 tie tightened on the superior thyroid artery.  A #11 blade was used to make an arteriotomy in the common carotid artery.  This was extended with Potts scissors along the anterior and lateral border of the common and internal carotid artery.  Approximately 90% stenosis was identified.  There was no thrombus identified.  The 10 french shunt was not placed because of the kink within the artery.  In addition, she had excellent backbleeding.  I felt that a shunt would compromise the repair given how deep the artery was because of the thyroid goiter..  A kleiner kuntz elevator was used to perform endarterectomy.  An eversion endarterectomy was performed in the external carotid artery.  A good distal endpoint was obtained in the internal carotid artery.  The specimen was removed and sent to pathology.  Heparinized saline was used to irrigate the endarterectomized field.  There was a large redundancy within  the internal carotid artery.  I contemplated resecting the common carotid felt that the most appropriate way to resect this to avoid kinking would be to resect the internal carotid artery.  I removed approximately 2 cm of the internal carotid artery and then primarily repaired it with a running 7-0 Prolene. All potential embolic debris was removed.  Bovine  pericardial patch angioplasty was then performed using a running 6-0 Prolene. Just prior to completion of the repair, the shunt was removed. The common internal and external carotid arteries were all appropriately flushed. The artery was again irrigated with heparin saline.  The anastomosis was then secured. The clamp was first released on the external carotid artery followed by the common carotid artery approximately 30 seconds later, bloodflow was reestablish through the internal carotid artery.  Next, a hand-held  Doppler was used to evaluate the signals in the common, external, and internal  carotid arteries, all of which had appropriate signals. I then administered  50 mg protamine. The wound was then irrigated.  After hemostasis was achieved, the carotid sheath was reapproximated with 3-0 Vicryl. The  platysma muscle was reapproximated with running 3-0 Vicryl. The skin  was closed with 4-0 Vicryl. Dermabond was placed on the skin. The  patient was then successfully extubated. His neurologic exam was  similar to his preprocedural exam. The patient was then taken to recovery room  in stable condition. There were no complications.     Disposition:  To PACU in stable condition.  Relevant Operative Details:  The patient had a large thyroid goiter that had to be mobilized in order to visualize and dissect out the carotid artery.  The goiter place the rod artery deep within the incision.  The patient had a very redundant internal carotid artery I did not place a shunt because of this, the depth of the wound, and the fact that the patient had pulsatile backbleeding.  After endarterectomy was performed, I elected to resect about 2 cm of the internal carotid artery with primary anastomosis using a 7-0 Prolene.  Bovine pericardial patch angioplasty was then performed on the internal and common carotid artery.  The patient was very slow to wake up in the operating room.  Ultimately however, she was able to  move all 4 extremities to command.  Theotis Burrow, M.D. [facility] [place] Office: [phone] Pager:  [phone]

## 2014-01-19 NOTE — Interval H&P Note (Signed)
History and Physical Interval Note:  9/38/1829 9:37 AM  Linda [name]  has presented today for surgery, with the diagnosis of Right carotid stenosis  The various methods of treatment have been discussed with the patient and family. After consideration of risks, benefits and other options for treatment, the patient has consented to  Procedure(s): ENDARTERECTOMY CAROTID (Right) as a surgical intervention .  The patient's history has been reviewed, patient examined, no change in status, stable for surgery.  I have reviewed the patient's chart and labs.  Questions were answered to the patient's satisfaction.     BRABHAM IV, V. WELLS

## 2014-01-19 NOTE — Anesthesia Postprocedure Evaluation (Signed)
  Anesthesia Post-op Note  Patient: Linda [name]  Procedure(s) Performed: Procedure(s): ENDARTERECTOMY CAROTID (Right)  Patient Location: PACU  Anesthesia Type:General  Level of Consciousness: awake, alert  and oriented  Airway and Oxygen Therapy: Patient Spontanous Breathing  Post-op Pain: mild  Post-op Assessment: Post-op Vital signs reviewed, Patient's Cardiovascular Status Stable, Respiratory Function Stable, Patent Airway, No signs of Nausea or vomiting and Pain level controlled  Post-op Vital Signs: Reviewed and stable  Complications: No apparent anesthesia complications

## 2014-01-19 NOTE — Anesthesia Preprocedure Evaluation (Signed)
Anesthesia Evaluation  Patient identified by MRN, date of birth, ID band Patient awake    Reviewed: Allergy & Precautions, H&P , NPO status , Patient's Chart, lab work & pertinent test results  History of Anesthesia Complications Negative for: history of anesthetic complications  Airway Mallampati: II TM Distance: >3 FB Neck ROM: Full    Dental  (+) Edentulous Upper, Edentulous Lower   Pulmonary neg sleep apnea, neg COPDCurrent Smoker,    Pulmonary exam normal       Cardiovascular Exercise Tolerance: Poor hypertension, Pt. on medications - angina+ Peripheral Vascular Disease - CAD, - Past MI and - CHF - dysrhythmias Rhythm:Regular Rate:Normal  HOCM with HOCM physiology on TTE   Neuro/Psych Anxiety Right carotid stenosis, no symptoms    GI/Hepatic Neg liver ROS, GERD-  Medicated and Controlled,  Endo/Other  goiter  Renal/GU negative Renal ROS     Musculoskeletal   Abdominal   Peds  Hematology   Anesthesia Other Findings   Reproductive/Obstetrics                           Anesthesia Physical Anesthesia Plan  ASA: III  Anesthesia Plan: General   Post-op Pain Management:    Induction: Intravenous  Airway Management Planned: Oral ETT  Additional Equipment: Arterial line  Intra-op Plan:   Post-operative Plan: Extubation in OR  Informed Consent: I have reviewed the patients History and Physical, chart, labs and discussed the procedure including the risks, benefits and alternatives for the proposed anesthesia with the patient or authorized representative who has indicated his/her understanding and acceptance.     Plan Discussed with: CRNA and Surgeon  Anesthesia Plan Comments:         Anesthesia Quick Evaluation

## 2014-01-19 NOTE — H&P (View-Only) (Signed)
Patient name: Linda [name] MRN: [id number] DOB: July 12, 1936 Sex: female     Chief Complaint  Patient presents with  . Re-evaluation    1-2 wk f/u CTA neck prior    HISTORY OF PRESENT ILLNESS: The patient is back today for followup of her right carotid stenosis.  She recently had an ultrasound that showed greater than 80% right carotid stenosis.  She is asymptomatic.  Ultrasound was somewhat limited by large thyroid quarter.  There was felt to be kinking on the posterior distal aspect of the carotid artery.  Therefore I sent her for a CT and Gram to better define her anatomy.  She again is without new symptoms.  Past Medical History  Diagnosis Date  . Constipation   . Hypertension   . Arthritis   . Nail fungus   . Diverticulosis of colon (without mention of hemorrhage)   . Breast cancer   . Uterine fibroid   . GERD (gastroesophageal reflux disease)     Past Surgical History  Procedure Laterality Date  . Right lumpectomy with right sentinel lymph node dissection  2004    Dr. Bubba Camp  . Cholecystectomy  ~1980  . Vaginal hysterectomy      History   Social History  . Marital Status: Single    Spouse Name: N/A    Number of Children: 2  . Years of Education: N/A   Occupational History  . Retired    Social History Main Topics  . Smoking status: Current Every Day Smoker -- 0.50 packs/day    Types: Cigarettes  . Smokeless tobacco: Never Used     Comment: form given 12/14/11  . Alcohol Use: No  . Drug Use: No  . Sexual Activity: No   Other Topics Concern  . Not on file   Social History Narrative  . No narrative on file    Family History  Problem Relation Age of Onset  . Colon cancer Mother   . Colon cancer Maternal Grandmother     Allergies as of 01/01/2014  . (No Known Allergies)    Current Outpatient Prescriptions on File Prior to Visit  Medication Sig Dispense Refill  . acetaminophen-codeine (TYLENOL #3) 300-30 MG per tablet Take 1 tablet by mouth  every 6 (six) hours as needed (cough).  30 tablet  0  . aspirin EC 81 MG tablet Take 1 tablet (81 mg total) by mouth daily.  1 tablet  0  . pantoprazole (PROTONIX) 40 MG tablet Take 40 mg by mouth daily.      . trandolapril (MAVIK) 4 MG tablet Take 4 mg by mouth daily.        . verapamil (CALAN-SR) 240 MG CR tablet Take 240 mg by mouth 2 (two) times daily.       . [DISCONTINUED] omeprazole (PRILOSEC) 20 MG capsule Take 1 capsule (20 mg total) by mouth daily.  30 capsule  0   No current facility-administered medications on file prior to visit.     REVIEW OF SYSTEMS: No changes from prior visit  PHYSICAL EXAMINATION:   Vital signs are BP 180/88  Pulse 81  Ht 5\' 4"  (1.626 m)  Wt 186 lb (84.369 kg)  BMI 31.91 kg/m2  SpO2 100% General: The patient appears their stated age. HEENT:  No gross abnormalities Pulmonary:  Non labored breathing Musculoskeletal: There are no major deformities. Neurologic: No focal weakness or paresthesias are detected, Skin: There are no ulcer or rashes noted. Psychiatric: The patient has normal  affect.    Diagnostic Studies I have reviewed her CT angiogram.  This is greater than 80% right carotid stenosis.  The stenosis and bifurcation is in the mid neck region.  There is a large cheek within the artery approximately 4 cm distal to the bifurcation.  Assessment: Asymptomatic right carotid stenosis Plan: After reviewing the patient's CT angiogram, I feel that she is a better candidate for carotid endarterectomy.  I discussed the risks and benefits of surgery.  The risks include the risk of stroke and nerve injury as well as bleeding.  Her operation is been scheduled for Friday, February 13.  I will have to be careful about, its distal internal carotid artery is dissected out because of large kink there.  If the cheek is exposed she may require resection of a redundant carotid artery.  V. Wells Dantrell Schertzer IV, M.D. [facility]  [place] Office: [phone] Pager:  [phone]    

## 2014-01-19 NOTE — Anesthesia Procedure Notes (Signed)
Procedure Name: Intubation Date/Time: 01/19/2014 7:45 AM Performed by: Ollen Bowl Pre-anesthesia Checklist: Patient identified, Timeout performed, Emergency Drugs available, Suction available and Patient being monitored Patient Re-evaluated:Patient Re-evaluated prior to inductionOxygen Delivery Method: Circle system utilized and Simple face mask Preoxygenation: Pre-oxygenation with 100% oxygen Intubation Type: IV induction Ventilation: Mask ventilation without difficulty and Oral airway inserted - appropriate to patient size Laryngoscope Size: Mac and 3 Grade View: Grade I Tube type: Oral Tube size: 7.5 mm Number of attempts: 1 Airway Equipment and Method: Patient positioned with wedge pillow and Stylet Placement Confirmation: ETT inserted through vocal cords under direct vision,  positive ETCO2 and breath sounds checked- equal and bilateral Secured at: 22 cm Tube secured with: Tape Dental Injury: Teeth and Oropharynx as per pre-operative assessment

## 2014-01-20 LAB — BASIC METABOLIC PANEL
BUN: 14 mg/dL (ref 6–23)
CHLORIDE: 105 meq/L (ref 96–112)
CO2: 24 mEq/L (ref 19–32)
CREATININE: 0.86 mg/dL (ref 0.50–1.10)
Calcium: 8.5 mg/dL (ref 8.4–10.5)
GFR calc non Af Amer: 64 mL/min — ABNORMAL LOW (ref >90)
GFR, EST AFRICAN AMERICAN: 74 mL/min — AB (ref >90)
Glucose, Bld: 98 mg/dL (ref 70–99)
Potassium: 4 mEq/L (ref 3.7–5.3)
Sodium: 141 mEq/L (ref 137–147)

## 2014-01-20 LAB — CBC
HCT: 38.1 % (ref 36.0–46.0)
Hemoglobin: 12.5 g/dL (ref 12.0–15.0)
MCH: 27.4 pg (ref 26.0–34.0)
MCHC: 32.8 g/dL (ref 30.0–36.0)
MCV: 83.6 fL (ref 78.0–100.0)
Platelets: 200 10*3/uL (ref 150–400)
RBC: 4.56 MIL/uL (ref 3.87–5.11)
RDW: 14.7 % (ref 11.5–15.5)
WBC: 11.5 10*3/uL — AB (ref 4.0–10.5)

## 2014-01-20 NOTE — Progress Notes (Addendum)
[facility]  Subjective  - Doing well has voided and walked.   Objective 121/57 78 99.4 F (37.4 C) (Oral) 21 97%  Intake/Output Summary (Last 24 hours) at 01/20/14 0742 Last data filed at 01/20/14 0300  Gross per 24 hour  Intake   2620 ml  Output      0 ml  Net   2620 ml    Sensation intact and equal bil. Upper ext. Palpable radial pulses equal. No tongue deviation. Symmetrical smile. Incision clean and dry.  Soft to touch.  Assessment/Planning: POD #1  Left Carotid Endarterectomy with resection of right internal carotid artery with primary anastomosis Discharge home.   Laurence Slate [facility] 01/20/2014 7:42 AM --  Laboratory Lab Results:  Recent Labs  01/19/14 1500 01/20/14 0300  WBC 10.5 11.5*  HGB 12.8 12.5  HCT 39.0 38.1  PLT 166 200   BMET  Recent Labs  01/19/14 1500 01/20/14 0300  NA  --  141  K  --  4.0  CL  --  105  CO2  --  24  GLUCOSE  --  98  BUN  --  14  CREATININE 0.82 0.86  CALCIUM  --  8.5    COAG Lab Results  Component Value Date   INR 1.04 01/12/2014   No results found for this basename: PTT   Patient examined-right neck with mild swelling-partially due to large goiter Swallowing well Minimal hoarseness No lateralizing weakness-vital signs stable Voiding small amounts  Plan ambulate in the hall and DC IV and see if patient tolerates breakfast. If doing well hopefully DC later this a.m.

## 2014-01-20 NOTE — Discharge Summary (Signed)
[facility] Discharge Summary   Patient ID:  AMIRRA HERLING MRN: [id number] DOB/AGE: 05-11-1936 78 y.o.  Admit date: 01/19/2014 Discharge date: 01/20/2014 Date of Surgery: 01/19/2014 Surgeon: Surgeon(s): Serafina Mitchell, MD  Admission Diagnosis: Right carotid stenosis  Discharge Diagnoses:  Right carotid stenosis  Secondary Diagnoses: Past Medical History  Diagnosis Date  . Constipation   . Hypertension   . Arthritis   . Nail fungus   . Diverticulosis of colon (without mention of hemorrhage)   . Breast cancer   . Uterine fibroid   . GERD (gastroesophageal reflux disease)     Procedure(s): ENDARTERECTOMY CAROTID  Discharged Condition: good  HPI:  Patient is a 78 y/o female with left carotid stenosis greater than 80%.  She underwent surgery for Carotid Endarterectomy on 01/19/2014 by Dr. Trula Slade.  She became very lethargic in the post-op recovery room.  she was moving all 4 extremities to command. She had good grip strength in the left hand as well as the right. She could lift both of her feet up off of the bed. Her tongue was midline. Her smile was symmetric. Her stay on 3S was uneventful.  She is stable, walking, taking PO's well, has voided independently.    Hospital Course:  PATSY ZARAGOZA is a 78 y.o. female is S/P Left Procedure(s): ENDARTERECTOMY CAROTID Extubated: POD # 0 Physical exam: Sensation intact and equal bil. Upper ext.  Palpable radial pulses equal.  No tongue deviation.  Symmetrical smile.  Incision clean and dry. Soft to touch.  Post-op wounds healing well Pt. Ambulating, voiding and taking PO diet without difficulty. Pt pain controlled with PO pain meds. Labs as below Complications:See HPI   Consults:     Significant Diagnostic Studies: CBC Lab Results  Component Value Date   WBC 11.5* 01/20/2014   HGB 12.5 01/20/2014   HCT 38.1 01/20/2014   MCV 83.6 01/20/2014   PLT 200 01/20/2014    BMET    Component Value  Date/Time   NA 141 01/20/2014 0300   K 4.0 01/20/2014 0300   CL 105 01/20/2014 0300   CO2 24 01/20/2014 0300   GLUCOSE 98 01/20/2014 0300   BUN 14 01/20/2014 0300   CREATININE 0.86 01/20/2014 0300   CREATININE 0.85 12/18/2013 1450   CALCIUM 8.5 01/20/2014 0300   GFRNONAA 64* 01/20/2014 0300   GFRAA 74* 01/20/2014 0300   COAG Lab Results  Component Value Date   INR 1.04 01/12/2014     Disposition:  Discharge to :Home Discharge Orders   Future Orders Complete By Expires   Call MD for:  redness, tenderness, or signs of infection (pain, swelling, bleeding, redness, odor or green/yellow discharge around incision site)  As directed    Call MD for:  severe or increased pain, loss or decreased feeling  in affected limb(s)  As directed    Call MD for:  temperature >100.5  As directed    Discharge patient  As directed    Comments:     Discharge pt to home   Driving Restrictions  As directed    Comments:     No driving for 2 weeks   Increase activity slowly  As directed    Comments:     Walk with assistance use walker or cane as needed   Lifting restrictions  As directed    Comments:     No lifting for 6 weeks   Resume previous diet  As directed  Medication List         aspirin EC 81 MG tablet  Take 1 tablet (81 mg total) by mouth daily.     oxyCODONE-acetaminophen 5-325 MG per tablet  Commonly known as:  PERCOCET/ROXICET  Take 1 tablet by mouth every 6 (six) hours as needed.     pantoprazole 40 MG tablet  Commonly known as:  PROTONIX  Take 40 mg by mouth daily.     trandolapril 4 MG tablet  Commonly known as:  MAVIK  Take 4 mg by mouth daily.     verapamil 240 MG CR tablet  Commonly known as:  CALAN-SR  Take 240 mg by mouth 2 (two) times daily.       Verbal and written Discharge instructions given to the patient. Wound care per Discharge AVS     Follow-up Information   Follow up with Trula Slade IV, [place], MD In 2 weeks. (sent to office)    Specialty:  Vascular  Surgery   Contact information:   [address] [place] [address] [phone]       Signed: Laurence Slate [facility] 01/20/2014, 7:46 AM  --- For VQI Registry use --- Instructions: Press F2 to tab through selections.  Delete question if not applicable.   Modified Rankin score at D/C (0-6): Rankin Score=0  IV medication needed for:  1. Hypertension: No 2. Hypotension: No  Post-op Complications: No  1. Post-op CVA or TIA: No  If yes: Event classification (right eye, left eye, right cortical, left cortical, verterobasilar, other):   If yes: Timing of event (intra-op, <6 hrs post-op, >=6 hrs post-op, unknown):   2. CN injury: No  If yes: CN  injuried   3. Myocardial infarction: No  If yes: Dx by (EKG or clinical, Troponin):   4.  CHF: No  5.  Dysrhythmia (new): No  6. Wound infection: No  7. Reperfusion symptoms: No  8. Return to OR: No  If yes: return to OR for (bleeding, neurologic, other CEA incision, other):   Discharge medications: Statin use:  No  for medical reason   ASA use:  Yes Beta blocker use:  No  for medical reason   ACE-Inhibitor use:  Yes P2Y12 Antagonist use: [x ] None, [ ]  Plavix, [ ]  Plasugrel, [ ]  Ticlopinine, [ ]  Ticagrelor, [ ]  Other, [ ]  No for medical reason, [ ]  Non-compliant, [ ]  Not-indicated Anti-coagulant use:  [x ] None, [ ]  Warfarin, [ ]  Rivaroxaban, [ ]  Dabigatran, [ ]  Other, [ ]  No for medical reason, [ ]  Non-compliant, [ ]  Not-indicated

## 2014-01-20 NOTE — Progress Notes (Signed)
Pt discharged per MD order. All discharge instructions reviewed and all questions answered. Surgical site clean, dry, and intact without any signs of infections.

## 2014-01-21 NOTE — Discharge Summary (Signed)
I agree with the above  Wells Brabham 

## 2014-01-22 ENCOUNTER — Telehealth: Payer: Self-pay | Admitting: Surgery

## 2014-01-22 NOTE — Telephone Encounter (Addendum)
Message copied by Gena Fray on Mon Jan 22, 2014  3:10 PM ------      Message from: Denman George      Created: Mon Jan 22, 2014 10:20 AM      Regarding: Micheline Rough                   ----- Message -----         From: Ulyses Amor, PA-C         Sent: 01/20/2014   7:45 AM           To: [facility] Charge Pool            F/U with Dr. Trula Slade s/p left carotid endarterectomy.  2 weeks ------  01/22/14: lm for pt re appt, dpm

## 2014-01-23 ENCOUNTER — Encounter (HOSPITAL_COMMUNITY): Payer: Self-pay | Admitting: Surgery

## 2014-02-02 ENCOUNTER — Encounter: Payer: Self-pay | Admitting: Surgery

## 2014-02-05 ENCOUNTER — Ambulatory Visit (INDEPENDENT_AMBULATORY_CARE_PROVIDER_SITE_OTHER): Payer: Self-pay | Admitting: Surgery

## 2014-02-05 ENCOUNTER — Encounter: Payer: Self-pay | Admitting: Surgery

## 2014-02-05 VITALS — BP 188/73 | HR 94 | Temp 98.1°F | Ht 64.0 in | Wt 181.4 lb

## 2014-02-05 DIAGNOSIS — Z48812 Encounter for surgical aftercare following surgery on the circulatory system: Secondary | ICD-10-CM

## 2014-02-05 DIAGNOSIS — I6529 Occlusion and stenosis of unspecified carotid artery: Secondary | ICD-10-CM

## 2014-02-05 NOTE — Progress Notes (Signed)
The patient is back today for followup.  On 01/19/2014 she underwent right carotid endarterectomy with bovine pericardial patch angioplasty.  I had to resect approximately 2 cm of the internal carotid artery which was primarily repaired.  This was secondary to a change.  Intraoperative findings included 90% stenosis.  The patient's postoperative course was uncomplicated and she was discharged to home the following day.  The patient reports doing well at home.  She has had some trouble with swallowing however she has no evidence of aspiration.  She does not endorse aspiration.  She states that her swallowing has improved.  She also complains of some discomfort around her incision.  On physical examination, she is neurologically intact.  Her tongue is midline.  Her smile is symmetric.  She has equal strength bilaterally.  The incision is healing nicely with the exception of approximately 1 cm of the inferior aspect of the incision with the skin had separated.  I cleaned this up today.  I do not think that it needs to be packed.  I told her to keep this covered with a dry gauze.  I have scheduled the patient to come back for a wound check in one month, however I told her that if her incision has completely healed that she can call and cancel this.  She has also been scheduled for a followup in 6 months with a repeat ultrasound.

## 2014-02-06 NOTE — Addendum Note (Signed)
Addended by: Mena Goes on: 02/06/2014 08:15 AM   Modules accepted: Orders

## 2014-03-12 ENCOUNTER — Ambulatory Visit: Payer: Self-pay | Admitting: Surgery

## 2014-08-17 ENCOUNTER — Encounter: Payer: Self-pay | Admitting: Surgery

## 2014-08-20 ENCOUNTER — Ambulatory Visit (HOSPITAL_COMMUNITY)
Admission: RE | Admit: 2014-08-20 | Discharge: 2014-08-20 | Disposition: A | Discharge status: Home / Self Care | Payer: PRIVATE HEALTH INSURANCE | Source: Ambulatory Visit | Attending: Surgery | Admitting: Surgery

## 2014-08-20 ENCOUNTER — Encounter: Payer: Self-pay | Admitting: Surgery

## 2014-08-20 ENCOUNTER — Ambulatory Visit (INDEPENDENT_AMBULATORY_CARE_PROVIDER_SITE_OTHER): Payer: PRIVATE HEALTH INSURANCE | Admitting: Surgery

## 2014-08-20 VITALS — BP 203/80 | HR 87 | Ht 64.0 in | Wt 171.0 lb

## 2014-08-20 DIAGNOSIS — I6529 Occlusion and stenosis of unspecified carotid artery: Secondary | ICD-10-CM

## 2014-08-20 DIAGNOSIS — Z48812 Encounter for surgical aftercare following surgery on the circulatory system: Secondary | ICD-10-CM | POA: Insufficient documentation

## 2014-08-20 NOTE — Progress Notes (Signed)
Patient name: Linda [name] MRN: [id number] DOB: 1936-08-01 Sex: female     Chief Complaint  Patient presents with  . Re-evaluation    6 month f/u carotid duplex    HISTORY OF PRESENT ILLNESS: The patient is back today for followup. On 01/19/2014 she underwent right carotid endarterectomy with bovine pericardial patch angioplasty. I had to resect approximately 2 cm of the internal carotid artery which was primarily repaired. This was secondary to a kink. Intraoperative findings included 90% stenosis. The patient's postoperative course was uncomplicated.  She has no complaints today.  She denies any neurologic episodes  She tells me that she will occasionally have some swallowing difficulty but this does not happen regularly.  She is concerned that this is secondary to her goiter.   Past Medical History  Diagnosis Date  . Constipation   . Hypertension   . Arthritis   . Nail fungus   . Diverticulosis of colon (without mention of hemorrhage)   . Breast cancer   . Uterine fibroid   . GERD (gastroesophageal reflux disease)     Past Surgical History  Procedure Laterality Date  . Right lumpectomy with right sentinel lymph node dissection  2004    Dr. Bubba Camp  . Cholecystectomy  ~1980  . Vaginal hysterectomy    . Colonoscopy      Hx; of  . Abdominal hysterectomy    . Endarterectomy Right 01/19/2014    Procedure: ENDARTERECTOMY CAROTID;  Surgeon: Serafina Mitchell, MD;  Location: [place];  Service: Vascular;  Laterality: Right;    History   Social History  . Marital Status: Single    Spouse Name: N/A    Number of Children: 2  . Years of Education: N/A   Occupational History  . Retired    Social History Main Topics  . Smoking status: Current Every Day Smoker -- 1.00 packs/day    Types: Cigarettes  . Smokeless tobacco: Never Used     Comment: form given 12/14/11  . Alcohol Use: No  . Drug Use: No  . Sexual Activity: No   Other Topics Concern  . Not on file   Social  History Narrative  . No narrative on file    Family History  Problem Relation Age of Onset  . Colon cancer Mother   . Colon cancer Maternal Grandmother     Allergies as of 08/20/2014  . (No Known Allergies)    Current Outpatient Prescriptions on File Prior to Visit  Medication Sig Dispense Refill  . aspirin EC 81 MG tablet Take 1 tablet (81 mg total) by mouth daily.  1 tablet  0  . oxyCODONE-acetaminophen (PERCOCET/ROXICET) 5-325 MG per tablet Take 1 tablet by mouth every 6 (six) hours as needed.  30 tablet  0  . pantoprazole (PROTONIX) 40 MG tablet Take 40 mg by mouth daily.      . trandolapril (MAVIK) 4 MG tablet Take 4 mg by mouth daily.       . verapamil (CALAN-SR) 240 MG CR tablet Take 240 mg by mouth 2 (two) times daily.       . [DISCONTINUED] omeprazole (PRILOSEC) 20 MG capsule Take 1 capsule (20 mg total) by mouth daily.  30 capsule  0   No current facility-administered medications on file prior to visit.     REVIEW OF SYSTEMS: Cardiovascular: No chest pain, chest pressure, palpitations, orthopnea, or dyspnea on exertion. No claudication or rest pain,  No history of DVT or phlebitis. Pulmonary:  No productive cough, asthma or wheezing. Neurologic: No weakness, paresthesias, aphasia, or amaurosis. No dizziness. Hematologic: No bleeding problems or clotting disorders. Musculoskeletal: No joint pain or joint swelling. Gastrointestinal: Occasional trouble swallowing Genitourinary: No dysuria or hematuria. Psychiatric:: No history of major depression. Integumentary: No rashes or ulcers. Constitutional: No fever or chills.  PHYSICAL EXAMINATION:   Vital signs are BP 203/80  Pulse 87  Ht 5\' 4"  (1.626 m)  Wt 171 lb (77.565 kg)  BMI 29.34 kg/m2  SpO2 100% General: The patient appears their stated age. HEENT:  No gross abnormalities Pulmonary:  Non labored breathing Musculoskeletal: There are no major deformities. Neurologic: No focal weakness or paresthesias are  detected, Skin: There are no ulcer or rashes noted. Psychiatric: The patient has normal affect. Cardiovascular: There is a regular rate and rhythm without significant murmur appreciated.  Right carotid incision has healed nicely.  No bruit   Diagnostic Studies I have ordered and reviewed her carotid Doppler study which shows no significant stenosis bilaterally.  Assessment: Status post right carotid endarterectomy Plan: The patient is doing very well.  I have recommended followup in one year with a repeat carotid ultrasound.  The patient does complain of swallowing trouble occasionally.  She is concerned that this is her goiter.  I recommended that she go back to see the surgeon who is managing this vs. being referred to gastroenterology for further evaluation vs. having her primary care doctor managed this.  She will contact her primary care physician.  The patient is hypertensive today.  She is asymptomatic.  I suspect that this is secondary to being in the doctor's office.  She will recheck at home and contact her primary care physician if it remains elevated  V. Leia Alf, M.D. [facility] [place] Office: [phone] Pager:  [phone]

## 2014-08-20 NOTE — Addendum Note (Signed)
Addended by: Mena Goes on: 08/20/2014 02:19 PM   Modules accepted: Orders

## 2014-09-27 ENCOUNTER — Encounter (HOSPITAL_COMMUNITY): Payer: Self-pay | Admitting: Emergency Medicine

## 2014-09-27 ENCOUNTER — Emergency Department (HOSPITAL_COMMUNITY): Payer: PRIVATE HEALTH INSURANCE

## 2014-09-27 ENCOUNTER — Inpatient Hospital Stay (HOSPITAL_COMMUNITY)
Admission: EM | Admit: 2014-09-27 | Discharge: 2014-09-30 | DRG: 439 | Disposition: A | Discharge status: Home / Self Care | Payer: PRIVATE HEALTH INSURANCE | Attending: Internal Medicine | Admitting: Internal Medicine

## 2014-09-27 DIAGNOSIS — K859 Acute pancreatitis without necrosis or infection, unspecified: Secondary | ICD-10-CM | POA: Diagnosis present

## 2014-09-27 DIAGNOSIS — Z79899 Other long term (current) drug therapy: Secondary | ICD-10-CM

## 2014-09-27 DIAGNOSIS — K219 Gastro-esophageal reflux disease without esophagitis: Secondary | ICD-10-CM | POA: Diagnosis present

## 2014-09-27 DIAGNOSIS — R1013 Epigastric pain: Secondary | ICD-10-CM

## 2014-09-27 DIAGNOSIS — K5909 Other constipation: Secondary | ICD-10-CM | POA: Diagnosis present

## 2014-09-27 DIAGNOSIS — M1990 Unspecified osteoarthritis, unspecified site: Secondary | ICD-10-CM | POA: Diagnosis present

## 2014-09-27 DIAGNOSIS — E871 Hypo-osmolality and hyponatremia: Secondary | ICD-10-CM | POA: Diagnosis present

## 2014-09-27 DIAGNOSIS — N390 Urinary tract infection, site not specified: Secondary | ICD-10-CM | POA: Diagnosis present

## 2014-09-27 DIAGNOSIS — Z720 Tobacco use: Secondary | ICD-10-CM | POA: Diagnosis present

## 2014-09-27 DIAGNOSIS — Z853 Personal history of malignant neoplasm of breast: Secondary | ICD-10-CM

## 2014-09-27 DIAGNOSIS — E876 Hypokalemia: Secondary | ICD-10-CM | POA: Diagnosis present

## 2014-09-27 DIAGNOSIS — I739 Peripheral vascular disease, unspecified: Secondary | ICD-10-CM | POA: Diagnosis present

## 2014-09-27 DIAGNOSIS — Z7982 Long term (current) use of aspirin: Secondary | ICD-10-CM

## 2014-09-27 DIAGNOSIS — R05 Cough: Secondary | ICD-10-CM

## 2014-09-27 DIAGNOSIS — Z23 Encounter for immunization: Secondary | ICD-10-CM

## 2014-09-27 DIAGNOSIS — R748 Abnormal levels of other serum enzymes: Secondary | ICD-10-CM | POA: Diagnosis present

## 2014-09-27 DIAGNOSIS — F17210 Nicotine dependence, cigarettes, uncomplicated: Secondary | ICD-10-CM | POA: Diagnosis present

## 2014-09-27 DIAGNOSIS — K8500 Idiopathic acute pancreatitis without necrosis or infection: Secondary | ICD-10-CM

## 2014-09-27 DIAGNOSIS — K5900 Constipation, unspecified: Secondary | ICD-10-CM | POA: Diagnosis present

## 2014-09-27 DIAGNOSIS — I10 Essential (primary) hypertension: Secondary | ICD-10-CM | POA: Diagnosis present

## 2014-09-27 DIAGNOSIS — R000 Tachycardia, unspecified: Secondary | ICD-10-CM

## 2014-09-27 DIAGNOSIS — R059 Cough, unspecified: Secondary | ICD-10-CM

## 2014-09-27 LAB — URINALYSIS, ROUTINE W REFLEX MICROSCOPIC
GLUCOSE, UA: NEGATIVE mg/dL
KETONES UR: NEGATIVE mg/dL
Nitrite: NEGATIVE
Protein, ur: >300 mg/dL — AB
Specific Gravity, Urine: 1.027 (ref 1.005–1.030)
Urobilinogen, UA: 1 mg/dL (ref 0.0–1.0)
pH: 5.5 (ref 5.0–8.0)

## 2014-09-27 LAB — CBC WITH DIFFERENTIAL/PLATELET
Basophils Absolute: 0 10*3/uL (ref 0.0–0.1)
Basophils Relative: 0 % (ref 0–1)
EOS ABS: 0.1 10*3/uL (ref 0.0–0.7)
EOS PCT: 2 % (ref 0–5)
HCT: 45.5 % (ref 36.0–46.0)
Hemoglobin: 14.9 g/dL (ref 12.0–15.0)
LYMPHS ABS: 2.8 10*3/uL (ref 0.7–4.0)
Lymphocytes Relative: 35 % (ref 12–46)
MCH: 26.5 pg (ref 26.0–34.0)
MCHC: 32.7 g/dL (ref 30.0–36.0)
MCV: 81 fL (ref 78.0–100.0)
Monocytes Absolute: 0.5 10*3/uL (ref 0.1–1.0)
Monocytes Relative: 6 % (ref 3–12)
Neutro Abs: 4.5 10*3/uL (ref 1.7–7.7)
Neutrophils Relative %: 57 % (ref 43–77)
Platelets: 232 10*3/uL (ref 150–400)
RBC: 5.62 MIL/uL — ABNORMAL HIGH (ref 3.87–5.11)
RDW: 13.1 % (ref 11.5–15.5)
WBC: 7.9 10*3/uL (ref 4.0–10.5)

## 2014-09-27 LAB — COMPREHENSIVE METABOLIC PANEL
ALT: 8 U/L (ref 0–35)
AST: 12 U/L (ref 0–37)
Albumin: 3.6 g/dL (ref 3.5–5.2)
Alkaline Phosphatase: 98 U/L (ref 39–117)
Anion gap: 17 — ABNORMAL HIGH (ref 5–15)
BUN: 12 mg/dL (ref 6–23)
CHLORIDE: 98 meq/L (ref 96–112)
CO2: 26 meq/L (ref 19–32)
Calcium: 9.9 mg/dL (ref 8.4–10.5)
Creatinine, Ser: 0.87 mg/dL (ref 0.50–1.10)
GFR calc Af Amer: 73 mL/min — ABNORMAL LOW (ref >90)
GFR, EST NON AFRICAN AMERICAN: 63 mL/min — AB (ref >90)
Glucose, Bld: 86 mg/dL (ref 70–99)
POTASSIUM: 3.5 meq/L — AB (ref 3.7–5.3)
Sodium: 141 mEq/L (ref 137–147)
Total Bilirubin: 0.5 mg/dL (ref 0.3–1.2)
Total Protein: 7.9 g/dL (ref 6.0–8.3)

## 2014-09-27 LAB — URINE MICROSCOPIC-ADD ON

## 2014-09-27 LAB — TROPONIN I

## 2014-09-27 LAB — LIPASE, BLOOD: LIPASE: 99 U/L — AB (ref 11–59)

## 2014-09-27 MED ORDER — MORPHINE SULFATE 4 MG/ML IJ SOLN
4.0000 mg | Freq: Once | INTRAMUSCULAR | Status: AC
  Administered 2014-09-27: 4 mg via INTRAVENOUS
  Filled 2014-09-27: qty 1

## 2014-09-27 MED ORDER — IOHEXOL 300 MG/ML  SOLN
100.0000 mL | Freq: Once | INTRAMUSCULAR | Status: AC | PRN
  Administered 2014-09-27: 100 mL via INTRAVENOUS

## 2014-09-27 MED ORDER — IOHEXOL 300 MG/ML  SOLN
25.0000 mL | Freq: Once | INTRAMUSCULAR | Status: AC | PRN
  Administered 2014-09-27: 25 mL via ORAL

## 2014-09-27 MED ORDER — SODIUM CHLORIDE 0.9 % IV BOLUS (SEPSIS)
1000.0000 mL | Freq: Once | INTRAVENOUS | Status: AC
  Administered 2014-09-27: 1000 mL via INTRAVENOUS

## 2014-09-27 NOTE — ED Notes (Signed)
Pt and family notified and this nurse had made the EDPA aware that she is in pain.  Waiting for an order.

## 2014-09-27 NOTE — ED Provider Notes (Signed)
CSN: [id number]     Arrival date & time 09/27/14  1624 History   First MD Initiated Contact with Patient 09/27/14 1936     Chief Complaint  Patient presents with  . Abdominal Pain     (Consider location/radiation/quality/duration/timing/severity/associated sxs/prior Treatment) The history is provided by the patient. No language interpreter was used.  Linda [name] is a 78 y/o F with PMHx of constipation, HTN, arthritis, breast cancer in remission x 12 years, GERD, uterine fibroid presenting to the ED with epigastric pain that has been ongoing for the past couple of weeks with worsening symptoms yesterday. Patient reported that she has been having a throbbing sensation to the epigastric region after an hour from eating food. Stated that starting the past couple of days she has been having dull aching pain constantly without radiation. Reported nausea. Stated that when she eats food the pain increases. Stated that she has had symptoms like this before. Denied fever, chills, neck pain, neck stiffness, vomiting, diarrhea, melena, [place], chest pain, shortness of breath, difficulty breathing, leg swelling. Denied EGD. PCP Dr. Alyson Ingles   Past Medical History  Diagnosis Date  . Constipation   . Hypertension   . Arthritis   . Nail fungus   . Diverticulosis of colon (without mention of hemorrhage)   . Breast cancer   . Uterine fibroid   . GERD (gastroesophageal reflux disease)    Past Surgical History  Procedure Laterality Date  . Right lumpectomy with right sentinel lymph node dissection  2004    Dr. Bubba Camp  . Cholecystectomy  ~1980  . Vaginal hysterectomy    . Colonoscopy      Hx; of  . Abdominal hysterectomy    . Endarterectomy Right 01/19/2014    Procedure: ENDARTERECTOMY CAROTID;  Surgeon: Serafina Mitchell, MD;  Location: [facility] OR;  Service: Vascular;  Laterality: Right;   Family History  Problem Relation Age of Onset  . Colon cancer Mother   . Colon cancer Maternal Grandmother     History  Substance Use Topics  . Smoking status: Current Every Day Smoker -- 1.00 packs/day    Types: Cigarettes  . Smokeless tobacco: Never Used     Comment: form given 12/14/11  . Alcohol Use: No   OB History   Grav Para Term Preterm Abortions TAB SAB Ect Mult Living                 Review of Systems  Constitutional: Negative for fever and chills.  Respiratory: Positive for cough. Negative for chest tightness and shortness of breath.   Cardiovascular: Negative for chest pain and leg swelling.  Gastrointestinal: Positive for nausea and abdominal pain. Negative for vomiting, diarrhea, constipation, blood in stool and anal bleeding.  Musculoskeletal: Negative for back pain, neck pain and neck stiffness.      Allergies  Review of patient's allergies indicates no known allergies.  Home Medications   Prior to Admission medications   Medication Sig Start Date End Date Taking? Authorizing Provider  aspirin EC 81 MG tablet Take 1 tablet (81 mg total) by mouth daily. 02/24/12  Yes [name], MD  pantoprazole (PROTONIX) 40 MG tablet Take 40 mg by mouth daily.   Yes Historical Provider, MD  trandolapril (MAVIK) 4 MG tablet Take 4 mg by mouth daily.    Yes Historical Provider, MD  verapamil (CALAN-SR) 240 MG CR tablet Take 240 mg by mouth 2 (two) times daily.    Yes Historical Provider, MD   BP  168/73  Pulse 99  Temp(Src) 98.3 F (36.8 C) (Oral)  Resp 16  SpO2 95% Physical Exam  Nursing note and vitals reviewed. Constitutional: She is oriented to person, place, and time. She appears well-developed and well-nourished. No distress.  HENT:  Head: Normocephalic.  Eyes: Conjunctivae and EOM are normal. Pupils are equal, round, and reactive to light. Right eye exhibits no discharge. Left eye exhibits no discharge.  Neck: Normal range of motion. Neck supple. No tracheal deviation present.  Cardiovascular: Normal rate, regular rhythm and normal heart sounds.  Exam reveals no  friction rub.   No murmur heard. Pulmonary/Chest: Effort normal and breath sounds normal. No respiratory distress. She has no wheezes. She has no rales. She exhibits no tenderness.  Abdominal: Soft. Bowel sounds are normal. She exhibits no distension. There is tenderness in the epigastric area. There is no rebound and no guarding.  Negative abdominal distention Bowel sounds normal active in all 4 quadrants Abdomen soft upon palpation Tenderness upon palpation to the epigastric region Negative peritoneal signs, guarding, rigidity  Musculoskeletal: Normal range of motion.  Full ROM to upper and lower extremities without difficulty noted, negative ataxia noted.  Lymphadenopathy:    She has no cervical adenopathy.  Neurological: She is alert and oriented to person, place, and time. No cranial nerve deficit. She exhibits normal muscle tone. Coordination normal.  Skin: Skin is warm and dry. No rash noted. She is not diaphoretic. No erythema.  Psychiatric: She has a normal mood and affect. Her behavior is normal. Thought content normal.    ED Course  Procedures (including critical care time)  Results for orders placed during the hospital encounter of 09/27/14  LIPASE, BLOOD      Result Value Ref Range   Lipase 99 (*) 11 - 59 U/L  COMPREHENSIVE METABOLIC PANEL      Result Value Ref Range   Sodium 141  137 - 147 mEq/L   Potassium 3.5 (*) 3.7 - 5.3 mEq/L   Chloride 98  96 - 112 mEq/L   CO2 26  19 - 32 mEq/L   Glucose, Bld 86  70 - 99 mg/dL   BUN 12  6 - 23 mg/dL   Creatinine, Ser 0.87  0.50 - 1.10 mg/dL   Calcium 9.9  8.4 - 10.5 mg/dL   Total Protein 7.9  6.0 - 8.3 g/dL   Albumin 3.6  3.5 - 5.2 g/dL   AST 12  0 - 37 U/L   ALT 8  0 - 35 U/L   Alkaline Phosphatase 98  39 - 117 U/L   Total Bilirubin 0.5  0.3 - 1.2 mg/dL   GFR calc non Af Amer 63 (*) >90 mL/min   GFR calc Af Amer 73 (*) >90 mL/min   Anion gap 17 (*) 5 - 15  CBC WITH DIFFERENTIAL      Result Value Ref Range   WBC 7.9   4.0 - 10.5 K/uL   RBC 5.62 (*) 3.87 - 5.11 MIL/uL   Hemoglobin 14.9  12.0 - 15.0 g/dL   HCT 45.5  36.0 - 46.0 %   MCV 81.0  78.0 - 100.0 fL   MCH 26.5  26.0 - 34.0 pg   MCHC 32.7  30.0 - 36.0 g/dL   RDW 13.1  11.5 - 15.5 %   Platelets 232  150 - 400 K/uL   Neutrophils Relative % 57  43 - 77 %   Neutro Abs 4.5  1.7 - 7.7 K/uL  Lymphocytes Relative 35  12 - 46 %   Lymphs Abs 2.8  0.7 - 4.0 K/uL   Monocytes Relative 6  3 - 12 %   Monocytes Absolute 0.5  0.1 - 1.0 K/uL   Eosinophils Relative 2  0 - 5 %   Eosinophils Absolute 0.1  0.0 - 0.7 K/uL   Basophils Relative 0  0 - 1 %   Basophils Absolute 0.0  0.0 - 0.1 K/uL  URINALYSIS, ROUTINE W REFLEX MICROSCOPIC      Result Value Ref Range   Color, Urine AMBER (*) YELLOW   APPearance TURBID (*) CLEAR   Specific Gravity, Urine 1.027  1.005 - 1.030   pH 5.5  5.0 - 8.0   Glucose, UA NEGATIVE  NEGATIVE mg/dL   Hgb urine dipstick MODERATE (*) NEGATIVE   Bilirubin Urine MODERATE (*) NEGATIVE   Ketones, ur NEGATIVE  NEGATIVE mg/dL   Protein, ur >300 (*) NEGATIVE mg/dL   Urobilinogen, UA 1.0  0.0 - 1.0 mg/dL   Nitrite NEGATIVE  NEGATIVE   Leukocytes, UA MODERATE (*) NEGATIVE  TROPONIN I      Result Value Ref Range   Troponin I <0.30  <0.30 ng/mL  URINE MICROSCOPIC-ADD ON      Result Value Ref Range   Squamous Epithelial / LPF RARE  RARE   WBC, UA TOO NUMEROUS TO COUNT  <3 WBC/hpf   RBC / HPF 7-10  <3 RBC/hpf   Bacteria, UA MANY (*) RARE   Casts GRANULAR CAST (*) NEGATIVE  TROPONIN I      Result Value Ref Range   Troponin I <0.30  <0.30 ng/mL    Labs Review Labs Reviewed  LIPASE, BLOOD - Abnormal; Notable for the following:    Lipase 99 (*)    All other components within normal limits  COMPREHENSIVE METABOLIC PANEL - Abnormal; Notable for the following:    Potassium 3.5 (*)    GFR calc non Af Amer 63 (*)    GFR calc Af Amer 73 (*)    Anion gap 17 (*)    All other components within normal limits  CBC WITH DIFFERENTIAL -  Abnormal; Notable for the following:    RBC 5.62 (*)    All other components within normal limits  URINALYSIS, ROUTINE W REFLEX MICROSCOPIC - Abnormal; Notable for the following:    Color, Urine AMBER (*)    APPearance TURBID (*)    Hgb urine dipstick MODERATE (*)    Bilirubin Urine MODERATE (*)    Protein, ur >300 (*)    Leukocytes, UA MODERATE (*)    All other components within normal limits  URINE MICROSCOPIC-ADD ON - Abnormal; Notable for the following:    Bacteria, UA MANY (*)    Casts GRANULAR CAST (*)    All other components within normal limits  URINE CULTURE  TROPONIN I  TROPONIN I  I-STAT CG4 LACTIC ACID, ED    Imaging Review Dg Chest 2 View  09/27/2014   CLINICAL DATA:  78 year old female with epigastric pain increasing x2 days. Cough. Initial encounter. Personal history of breast cancer.  EXAM: CHEST  2 VIEW  COMPARISON:  01/12/2014 and earlier.  FINDINGS: Semi upright AP and lateral views of the chest. Stable lung volumes. Stable cardiac size and mediastinal contours. Visualized tracheal air column is within normal limits. No pneumothorax or pulmonary edema. No pleural effusion or consolidation. Chronic increased interstitial markings in both lungs are stable. Stable visualized osseous structures. Surgical clips in the  right neck soft tissues may be new. No pneumoperitoneum identified.  IMPRESSION: No acute cardiopulmonary abnormality.   Electronically Signed   By: Lars Pinks M.D.   On: 09/27/2014 21:12   Ct Abdomen Pelvis W Contrast  09/27/2014   CLINICAL DATA:  78 year old female with acute epigastric pain and nausea. Burning with urination. Initial encounter.  EXAM: CT ABDOMEN AND PELVIS WITH CONTRAST  TECHNIQUE: Multidetector CT imaging of the abdomen and pelvis was performed using the standard protocol following bolus administration of intravenous contrast.  CONTRAST:  88mL OMNIPAQUE IOHEXOL 300 MG/ML SOLN, 152mL OMNIPAQUE IOHEXOL 300 MG/ML SOLN  COMPARISON:  CT Abdomen  and Pelvis 12/10/2011 and earlier.  FINDINGS: Stable, negative lung bases.  No pericardial or pleural effusion.  Degenerative changes in the spine. No acute osseous abnormality identified.  Uterus and ovaries not identified, presumed surgically absent. There are multiple nondilated small bowel loops in the deep pelvis. Negative rectum.  The bladder is more decompressed, but appears thick walled and with some wall enhancement (series 2, image 66). No perivesical stranding. No gas within the bladder.  Redundant sigmoid colon with diverticulosis but no active inflammation. Diverticula continued to the splenic flexure along with retained stool in the colon. Oral contrast has reached the hepatic flexure. Normal right colon and appendix. No dilated small bowel. Decompressed stomach and duodenum.  Gallbladder surgically absent as before. Liver, spleen, pancreas, and adrenal glands are within normal limits. Stable multiple exophytic renal cysts on the left. Multiple smaller right renal cysts also appears stable. Symmetric renal contrast excretion. No abdominal free fluid. Portal venous system within normal limits. Extensive Aortoiliac calcified atherosclerosis noted. Major arterial structures in the abdomen and pelvis are patent. No lymphadenopathy identified. Stable small fact containing umbilical hernia.  IMPRESSION: 1. Suggestion of bladder wall thickening and hyper enhancement such as due to UTI. 2. Otherwise no acute or inflammatory findings in the abdomen or pelvis.   Electronically Signed   By: Lars Pinks M.D.   On: 09/27/2014 22:31     EKG Interpretation None     12:44 PM Patient continues to be tachycardic with a heart rate of 11 bpm - 112 bpm. Discussed case, labs, vitals, ED course in great detail with Dr. Loralee Pacas. Physician to come and assess patient.   MDM   Final diagnoses:  Epigastric pain  Cough  Urinary tract infection without hematuria, site unspecified  Tachycardia    Medications   sodium chloride 0.9 % bolus 1,000 mL (0 mLs Intravenous Stopped 09/27/14 2225)  morphine 4 MG/ML injection 4 mg (4 mg Intravenous Given 09/27/14 2226)  iohexol (OMNIPAQUE) 300 MG/ML solution 25 mL (25 mLs Oral Contrast Given 09/27/14 2221)  iohexol (OMNIPAQUE) 300 MG/ML solution 100 mL (100 mLs Intravenous Contrast Given 09/27/14 2221)  cefTRIAXone (ROCEPHIN) 1 g in dextrose 5 % 50 mL IVPB (0 g Intravenous Stopped 09/28/14 0049)  sodium chloride 0.9 % bolus 1,000 mL (1,000 mLs Intravenous New Bag/Given 09/28/14 0052)   Filed Vitals:   09/27/14 1729 09/27/14 2302  BP: 162/90 168/73  Pulse: 107 99  Temp: 98.3 F (36.8 C) 98.3 F (36.8 C)  TempSrc:  Oral  Resp: 20 16  SpO2: 100% 95%   EKG noted sinus tachycardia with heart rate 107 bpm with PACs. Troponin negative elevation. Second troponin negative elevation. CBC negative elevated white blood cell count. Hemoglobin 14.9, hematocrit 45.5. CMP noted mildly low potassium 3.5. Mildly elevated anion gap of 17.0 mEq per liter. Lipase elevated at 99. Urinalysis noted  moderate hemoglobin with negative nitrites and moderate leukocytes with white blood cell count too numerous to count with many bacteria-urine culture pending. Chest xray negative for acute cardiopulmonary disease. CT abdomen and pelvis with contrast noted bladder wall thickening and hyperenhancement such as due to UTI-no inflammatory acute abdominal or pelvic processes noted. Patient presenting to the ED with urinary tract infection-IV Rocephin administered. Patient given IV fluids - patient continues to be tachycardic with a heart rate of 110 bpm. Doubt pancreatitis. Doubt appendicitis. Doubt acute abdominal processes. Doubt pneumonia. Patient to be admitted to the hospital for observation regarding tachycardia while in the ED setting that was not controlled, can be due to UTI. Patient admitted to the hospital. Patient agreed to plan of care. Patient stable for transfer.    Jamse Mead, PA-C 09/28/14 0205

## 2014-09-27 NOTE — ED Notes (Signed)
EKG given to EDP,Plunkett,MD., for review. 

## 2014-09-27 NOTE — ED Notes (Signed)
Pt reports upper abdominal pain for past several weeks. Pt has nausea but no n/v. Pt also thinks she has bladder infection as she has burning when she urinates and frequency.

## 2014-09-27 NOTE — ED Notes (Signed)
Went in to medicate pt, pt was in CT

## 2014-09-28 ENCOUNTER — Encounter (HOSPITAL_COMMUNITY): Payer: Self-pay | Admitting: Oncology

## 2014-09-28 DIAGNOSIS — E871 Hypo-osmolality and hyponatremia: Secondary | ICD-10-CM | POA: Diagnosis not present

## 2014-09-28 DIAGNOSIS — N390 Urinary tract infection, site not specified: Secondary | ICD-10-CM | POA: Diagnosis present

## 2014-09-28 DIAGNOSIS — I739 Peripheral vascular disease, unspecified: Secondary | ICD-10-CM | POA: Diagnosis not present

## 2014-09-28 DIAGNOSIS — M1990 Unspecified osteoarthritis, unspecified site: Secondary | ICD-10-CM | POA: Diagnosis not present

## 2014-09-28 DIAGNOSIS — R748 Abnormal levels of other serum enzymes: Secondary | ICD-10-CM | POA: Diagnosis present

## 2014-09-28 DIAGNOSIS — I10 Essential (primary) hypertension: Secondary | ICD-10-CM | POA: Diagnosis not present

## 2014-09-28 DIAGNOSIS — E876 Hypokalemia: Secondary | ICD-10-CM | POA: Diagnosis present

## 2014-09-28 DIAGNOSIS — Z23 Encounter for immunization: Secondary | ICD-10-CM | POA: Diagnosis not present

## 2014-09-28 DIAGNOSIS — Z79899 Other long term (current) drug therapy: Secondary | ICD-10-CM | POA: Diagnosis not present

## 2014-09-28 DIAGNOSIS — K859 Acute pancreatitis without necrosis or infection, unspecified: Secondary | ICD-10-CM

## 2014-09-28 DIAGNOSIS — R1013 Epigastric pain: Secondary | ICD-10-CM | POA: Diagnosis present

## 2014-09-28 DIAGNOSIS — K219 Gastro-esophageal reflux disease without esophagitis: Secondary | ICD-10-CM | POA: Diagnosis not present

## 2014-09-28 DIAGNOSIS — Z853 Personal history of malignant neoplasm of breast: Secondary | ICD-10-CM | POA: Diagnosis not present

## 2014-09-28 DIAGNOSIS — Z720 Tobacco use: Secondary | ICD-10-CM

## 2014-09-28 DIAGNOSIS — Z7982 Long term (current) use of aspirin: Secondary | ICD-10-CM | POA: Diagnosis not present

## 2014-09-28 DIAGNOSIS — K5900 Constipation, unspecified: Secondary | ICD-10-CM | POA: Diagnosis not present

## 2014-09-28 DIAGNOSIS — F17210 Nicotine dependence, cigarettes, uncomplicated: Secondary | ICD-10-CM | POA: Diagnosis not present

## 2014-09-28 HISTORY — DX: Tobacco use: Z72.0

## 2014-09-28 HISTORY — DX: Acute pancreatitis without necrosis or infection, unspecified: K85.90

## 2014-09-28 LAB — CBC
HEMATOCRIT: 39.9 % (ref 36.0–46.0)
Hemoglobin: 13.3 g/dL (ref 12.0–15.0)
MCH: 27 pg (ref 26.0–34.0)
MCHC: 33.3 g/dL (ref 30.0–36.0)
MCV: 80.9 fL (ref 78.0–100.0)
Platelets: 198 10*3/uL (ref 150–400)
RBC: 4.93 MIL/uL (ref 3.87–5.11)
RDW: 13.1 % (ref 11.5–15.5)
WBC: 6.6 10*3/uL (ref 4.0–10.5)

## 2014-09-28 LAB — BASIC METABOLIC PANEL
ANION GAP: 13 (ref 5–15)
BUN: 10 mg/dL (ref 6–23)
CALCIUM: 8.6 mg/dL (ref 8.4–10.5)
CO2: 24 meq/L (ref 19–32)
CREATININE: 0.81 mg/dL (ref 0.50–1.10)
Chloride: 101 mEq/L (ref 96–112)
GFR, EST AFRICAN AMERICAN: 80 mL/min — AB (ref >90)
GFR, EST NON AFRICAN AMERICAN: 69 mL/min — AB (ref >90)
Glucose, Bld: 102 mg/dL — ABNORMAL HIGH (ref 70–99)
Potassium: 2.9 mEq/L — CL (ref 3.7–5.3)
Sodium: 138 mEq/L (ref 137–147)

## 2014-09-28 LAB — URINE CULTURE
COLONY COUNT: NO GROWTH
Culture: NO GROWTH
Special Requests: NORMAL

## 2014-09-28 LAB — TROPONIN I: Troponin I: <0.3 ng/mL (ref <0.30)

## 2014-09-28 LAB — I-STAT CG4 LACTIC ACID, ED: Lactic Acid, Venous: 0.69 mmol/L (ref 0.5–2.2)

## 2014-09-28 MED ORDER — INFLUENZA VAC SPLIT QUAD 0.5 ML IM SUSY
0.5000 mL | PREFILLED_SYRINGE | INTRAMUSCULAR | Status: AC
  Administered 2014-09-29: 0.5 mL via INTRAMUSCULAR
  Filled 2014-09-28 (×2): qty 0.5

## 2014-09-28 MED ORDER — PNEUMOCOCCAL VAC POLYVALENT 25 MCG/0.5ML IJ INJ
0.5000 mL | INJECTION | INTRAMUSCULAR | Status: AC
  Administered 2014-09-29: 0.5 mL via INTRAMUSCULAR
  Filled 2014-09-28 (×2): qty 0.5

## 2014-09-28 MED ORDER — HYDROMORPHONE HCL 1 MG/ML IJ SOLN
1.0000 mg | INTRAMUSCULAR | Status: DC | PRN
  Administered 2014-09-28: 1 mg via INTRAVENOUS
  Filled 2014-09-28: qty 1

## 2014-09-28 MED ORDER — ALUM & MAG HYDROXIDE-SIMETH 200-200-20 MG/5ML PO SUSP
30.0000 mL | Freq: Four times a day (QID) | ORAL | Status: DC | PRN
  Filled 2014-09-28: qty 30

## 2014-09-28 MED ORDER — ONDANSETRON HCL 4 MG PO TABS: 4.0000 mg | ORAL_TABLET | Freq: Four times a day (QID) | ORAL | Status: DC | PRN

## 2014-09-28 MED ORDER — ASPIRIN EC 81 MG PO TBEC
81.0000 mg | DELAYED_RELEASE_TABLET | Freq: Every day | ORAL | Status: DC
  Administered 2014-09-28 – 2014-09-30 (×3): 81 mg via ORAL
  Filled 2014-09-28 (×3): qty 1

## 2014-09-28 MED ORDER — TRANDOLAPRIL 4 MG PO TABS
4.0000 mg | ORAL_TABLET | Freq: Every day | ORAL | Status: DC
  Administered 2014-09-28 – 2014-09-30 (×3): 4 mg via ORAL
  Filled 2014-09-28 (×3): qty 1

## 2014-09-28 MED ORDER — SODIUM CHLORIDE 0.9 % IV BOLUS (SEPSIS)
1000.0000 mL | Freq: Once | INTRAVENOUS | Status: AC
  Administered 2014-09-28: 1000 mL via INTRAVENOUS

## 2014-09-28 MED ORDER — ENOXAPARIN SODIUM 40 MG/0.4ML ~~LOC~~ SOLN
40.0000 mg | Freq: Every day | SUBCUTANEOUS | Status: DC
  Administered 2014-09-28 – 2014-09-30 (×3): 40 mg via SUBCUTANEOUS
  Filled 2014-09-28 (×3): qty 0.4

## 2014-09-28 MED ORDER — POTASSIUM CHLORIDE IN NACL 40-0.9 MEQ/L-% IV SOLN
INTRAVENOUS | Status: DC
  Administered 2014-09-28 – 2014-09-29 (×2): 75 mL/h via INTRAVENOUS
  Filled 2014-09-28 (×3): qty 1000

## 2014-09-28 MED ORDER — ONDANSETRON HCL 4 MG/2ML IJ SOLN
4.0000 mg | Freq: Four times a day (QID) | INTRAMUSCULAR | Status: DC | PRN
  Administered 2014-09-28: 4 mg via INTRAVENOUS
  Filled 2014-09-28: qty 2

## 2014-09-28 MED ORDER — VERAPAMIL HCL ER 240 MG PO TBCR
240.0000 mg | EXTENDED_RELEASE_TABLET | Freq: Two times a day (BID) | ORAL | Status: DC
  Administered 2014-09-28 – 2014-09-30 (×6): 240 mg via ORAL
  Filled 2014-09-28 (×7): qty 1

## 2014-09-28 MED ORDER — CETYLPYRIDINIUM CHLORIDE 0.05 % MT LIQD
7.0000 mL | Freq: Two times a day (BID) | OROMUCOSAL | Status: DC
  Administered 2014-09-28 – 2014-09-30 (×5): 7 mL via OROMUCOSAL

## 2014-09-28 MED ORDER — SODIUM CHLORIDE 0.9 % IV SOLN
INTRAVENOUS | Status: DC
Start: 2014-09-28 — End: 2014-09-28
  Administered 2014-09-28: 04:00:00 via INTRAVENOUS

## 2014-09-28 MED ORDER — DEXTROSE 5 % IV SOLN
1.0000 g | Freq: Once | INTRAVENOUS | Status: AC
  Administered 2014-09-28: 1 g via INTRAVENOUS
  Filled 2014-09-28: qty 10

## 2014-09-28 MED ORDER — POTASSIUM CHLORIDE CRYS ER 20 MEQ PO TBCR
40.0000 meq | EXTENDED_RELEASE_TABLET | Freq: Two times a day (BID) | ORAL | Status: AC
  Administered 2014-09-28 (×2): 40 meq via ORAL
  Filled 2014-09-28 (×2): qty 2

## 2014-09-28 MED ORDER — DEXTROSE 5 % IV SOLN
1.0000 g | INTRAVENOUS | Status: DC
  Administered 2014-09-28: 1 g via INTRAVENOUS
  Filled 2014-09-28: qty 10

## 2014-09-28 MED ORDER — PANTOPRAZOLE SODIUM 40 MG PO TBEC
40.0000 mg | DELAYED_RELEASE_TABLET | Freq: Every day | ORAL | Status: DC
  Administered 2014-09-28 – 2014-09-30 (×3): 40 mg via ORAL
  Filled 2014-09-28 (×3): qty 1

## 2014-09-28 NOTE — Progress Notes (Signed)
Critical lab value for K+ 2.9. Received order for oral potassium 40 Meq BID. First dose given at approx. [place]

## 2014-09-28 NOTE — Care Management Note (Signed)
CARE MANAGEMENT NOTE 09/28/2014  Patient:  Linda [name],Linda [name]   Account Number:  [id number]  Date Initiated:  09/28/2014  Documentation initiated by:  Marney Doctor  Subjective/Objective Assessment:   78 yo admitted with abd pain and dysuria     Action/Plan:   From home with children   Anticipated DC Date:  10/01/2014   Anticipated DC Plan:  [place]  CM consult      Choice offered to / List presented to:             Status of service:  In process, will continue to follow Medicare Important Message given?   (If response is "NO", the following Medicare IM given date fields will be blank) Date Medicare IM given:   Medicare IM given by:   Date Additional Medicare IM given:   Additional Medicare IM given by:    Discharge Disposition:    Per UR Regulation:  Reviewed for med. necessity/level of care/duration of stay  If discussed at [place] of Stay Meetings, dates discussed:    Comments:  09/28/14 Marney Doctor RN,BSN,NCM [phone] Chart reviewed and CM following for DC needs.  Pt could potentially benefit from a PT/OT eval to help prepare for safe discharge plan.

## 2014-09-28 NOTE — Progress Notes (Signed)
CRITICAL VALUE ALERT  Critical value received:  K 2.9  Date of notification:  09/28/2014  Time of notification:  0513  Critical value read back:Yes.    Nurse who received alert:  Nialta  MD notified (1st page):  [place], PA-C  Time of first page:  0519  MD notified (2nd page):  Time of second page:  Responding MD:  Fredderick Severance  Time MD responded:  [phone]

## 2014-09-28 NOTE — Progress Notes (Signed)
INITIAL NUTRITION ASSESSMENT  DOCUMENTATION CODES Per approved criteria  -Not Applicable   INTERVENTION: Clear liquid diet as tolerated with advancement per MD. Will add supplement when appropriate RD to follow  NUTRITION DIAGNOSIS: Inadequate oral intake related to altered GI function and poor appetite as evidenced by clear liquid diet, vomiting and patient report of poor appetite.   Goal: Intake of meals and supplements to meet >90% estimated needs.  Monitor:  Intake, labs, weight trend  Reason for Assessment: MST  78 y.o. female  Admitting Dx: UTI (lower urinary tract infection)  ASSESSMENT: Patient admitted with UTI, N/V.  Patient vomited during my visit.  Unable to tolerate clear liquids at this time.  Patient reports very poor intake for the past 3 weeks and had to force "a bite or 2".  UBW 180-185 lbs 3-4 months ago.  Patient with weight loss of approximately 10 lbs (5%) in the past 3-4 months.  Height: Ht Readings from Last 1 Encounters:  08/20/14 5\' 4"  (1.626 m)    Weight: Wt Readings from Last 1 Encounters:  08/20/14 171 lb (77.565 kg)  Current weight unknown  Ideal Body Weight: 120 lbs  % Ideal Body Weight: 143  Wt Readings from Last 10 Encounters:  08/20/14 171 lb (77.565 kg)  02/05/14 181 lb 6.4 oz (82.283 kg)  01/19/14 194 lb 14.2 oz (88.4 kg)  01/19/14 194 lb 14.2 oz (88.4 kg)  01/12/14 186 lb 11.2 oz (84.687 kg)  01/01/14 186 lb (84.369 kg)  12/18/13 185 lb (83.915 kg)  11/08/13 183 lb (83.008 kg)  12/03/12 175 lb 11.3 oz (79.7 kg)  10/24/12 181 lb (82.101 kg)    Usual Body Weight: 180 lbs  % Usual Body Weight: 95  BMI:  29  Estimated Nutritional Needs: Kcal: 1750-1850 Protein: 55-65 gm  Fluid: >2L daily  Skin: intact  Diet Order: Clear Liquid  EDUCATION NEEDS: -Education needs addressed   Intake/Output Summary (Last 24 hours) at 09/28/14 1358 Last data filed at 09/28/14 1300  Gross per 24 hour  Intake  219.6 ml  Output       0 ml  Net  219.6 ml    Labs:   Recent Labs Lab 09/27/14 1826 09/28/14 0345  NA 141 138  K 3.5* 2.9*  CL 98 101  CO2 26 24  BUN 12 10  CREATININE 0.87 0.81  CALCIUM 9.9 8.6  GLUCOSE 86 102*    CBG (last 3)  No results found for this basename: GLUCAP,  in the last 72 hours  Scheduled Meds: . antiseptic oral rinse  7 mL Mouth Rinse BID  . aspirin EC  81 mg Oral Daily  . cefTRIAXone (ROCEPHIN)  IV  1 g Intravenous Q24H  . enoxaparin (LOVENOX) injection  40 mg Subcutaneous Daily  . [START ON 09/29/2014] Influenza vac split quadrivalent PF  0.5 mL Intramuscular Tomorrow-1000  . pantoprazole  40 mg Oral Daily  . [START ON 09/29/2014] pneumococcal 23 valent vaccine  0.5 mL Intramuscular Tomorrow-1000  . trandolapril  4 mg Oral Daily  . verapamil  240 mg Oral BID    Continuous Infusions: . 0.9 % NaCl with KCl 40 mEq / L 75 mL/hr (09/28/14 1103)    Past Medical History  Diagnosis Date  . Constipation   . Hypertension   . Arthritis   . Nail fungus   . Diverticulosis of colon (without mention of hemorrhage)   . Breast cancer   . Uterine fibroid   . GERD (gastroesophageal reflux disease)  Past Surgical History  Procedure Laterality Date  . Right lumpectomy with right sentinel lymph node dissection  2004    Dr. Bubba Camp  . Cholecystectomy  ~1980  . Vaginal hysterectomy    . Colonoscopy      Hx; of  . Abdominal hysterectomy    . Endarterectomy Right 01/19/2014    Procedure: ENDARTERECTOMY CAROTID;  Surgeon: Serafina Mitchell, MD;  Location: [place];  Service: Vascular;  Laterality: Right;    Antonieta Iba, RD, LDN Clinical Inpatient Dietitian Pager:  [phone] Weekend and after hours pager:  [phone]

## 2014-09-28 NOTE — Progress Notes (Signed)
UR completed 

## 2014-09-28 NOTE — Progress Notes (Addendum)
Progress Note   Linda [name] TWS:[id number] DOB: 1936/11/17 DOA: 09/27/2014 PCP: Linda Hey, MD   Brief Narrative:   Linda [name] is an 78 y.o. female with a PMH of GERD, breast cancer status post right lumpectomy who was admitted 09/28/14 with generalized complaints of dysuria, epigastric abdominal pain with nausea and vomiting as well as anorexia. Upon initial evaluation in the ED, she was found to be tachycardic with heart rate of 106, hyponatremic with a potassium 3.5, lipase 99, chest x-ray negative, CT showing bladder wall thickening but was otherwise negative. Urinalysis was positive for protein and trace leukocytes.  Assessment/Plan:   Principal Problem:   Acute pancreatitis with abdominal pain /elevated lipase  Bowel rest with clear liquids.  Conservative care with IV fluids, pain medicines, and nausea medicines as needed.  Unclear trigger. The patient has no history of alcohol abuse and is status post cholecystectomy.  Check triglycerides.  Active Problems:   UTI (lower urinary tract infection)  On empiric Rocephin. Followup culture results.    Essential hypertension  Continue Mavik and Calan-SR.  BP suboptimal.  May need BP meds adjusted.    CONSTIPATION, CHRONIC  Currently on a clear liquid diet. May need a bowel regimen and diet advanced.    Peripheral vascular disease  Continue aspirin.    Hypokalemia  Repleting.    Tobacco Abuse  1 ppd habit.  Counseled.      DVT Prophylaxis  Continue Lovenox.  Code Status: Full. Family Communication: Daughter, Linda [name], at bedside. Disposition Plan: Home when stable.   IV Access:    Peripheral IV   Procedures and diagnostic studies:   Dg Chest 2 View 09/27/2014: No acute cardiopulmonary abnormality.   Ct Abdomen Pelvis W Contrast 09/27/2014: 1. Suggestion of bladder wall thickening and hyper enhancement such as due to UTI. 2. Otherwise no acute or inflammatory findings in the abdomen or  pelvis.   Medical Consultants:    None.  Anti-Infectives:    Rocephin 09/28/14--->  Subjective:    Linda [name] complains of level 7-8/10 abdominal pain, nagging in quality.  No further N/V since last night.  No diarrhea.  Cough noted.  Objective:    Filed Vitals:   09/27/14 2302 09/28/14 0329 09/28/14 0400 09/28/14 0559  BP: 168/73  170/90 162/85  Pulse: 99 91  95  Temp: 98.3 F (36.8 C)   98.6 F (37 C)  TempSrc: Oral Oral  Oral  Resp: 16 16  16   SpO2: 95% 100%  100%    Intake/Output Summary (Last 24 hours) at 09/28/14 0813 Last data filed at 09/28/14 0551  Gross per 24 hour  Intake  159.6 ml  Output      0 ml  Net  159.6 ml    Exam: Gen:  NAD Cardiovascular:  RRR, No M/R/G Respiratory:  Lungs CTAB Gastrointestinal:  Abdomen soft, tender mid-epigastrium, + BS Extremities:  No C/E/C   Data Reviewed:    Labs: Basic Metabolic Panel:  Recent Labs Lab 09/27/14 1826 09/28/14 0345  NA 141 138  K 3.5* 2.9*  CL 98 101  CO2 26 24  GLUCOSE 86 102*  BUN 12 10  CREATININE 0.87 0.81  CALCIUM 9.9 8.6   GFR The CrCl is unknown because both a height and weight (above a minimum accepted value) are required for this calculation. Liver Function Tests:  Recent Labs Lab 09/27/14 1826  AST 12  ALT 8  ALKPHOS 98  BILITOT 0.5  PROT 7.9  ALBUMIN 3.6    Recent Labs Lab 09/27/14 1826  LIPASE 99*    CBC:  Recent Labs Lab 09/27/14 1826 09/28/14 0345  WBC 7.9 6.6  NEUTROABS 4.5  --   HGB 14.9 13.3  HCT 45.5 39.9  MCV 81.0 80.9  PLT 232 198   Cardiac Enzymes:  Recent Labs Lab 09/27/14 1829 09/27/14 2339  TROPONINI <0.30 <0.30   Sepsis Labs:  Recent Labs Lab 09/27/14 1826 09/28/14 0219 09/28/14 0345  WBC 7.9  --  6.6  LATICACIDVEN  --  0.69  --    Microbiology No results found for this or any previous visit (from the past 240 hour(s)).   Medications:   . antiseptic oral rinse  7 mL Mouth Rinse BID  . aspirin EC  81 mg  Oral Daily  . cefTRIAXone (ROCEPHIN)  IV  1 g Intravenous Q24H  . [START ON 09/29/2014] Influenza vac split quadrivalent PF  0.5 mL Intramuscular Tomorrow-1000  . pantoprazole  40 mg Oral Daily  . [START ON 09/29/2014] pneumococcal 23 valent vaccine  0.5 mL Intramuscular Tomorrow-1000  . potassium chloride  40 mEq Oral BID  . trandolapril  4 mg Oral Daily  . verapamil  240 mg Oral BID   Continuous Infusions: . 0.9 % NaCl with KCl 40 mEq / L      Time spent: 35 minutes with > 50% of time discussing current diagnostic test results, clinical impression and plan of care.    LOS: 1 day   Linda [name]  [facility] Pager [phone]. If unable to reach me by pager, please call my cell phone at [phone].  *Please refer to [email or web address], password [email or web address] to get updated schedule on who will round on this patient, as hospitalists switch teams weekly. If 7PM-7AM, please contact night-coverage at [email or web address], password [email or web address] for any overnight needs.  09/28/2014, 8:13 AM

## 2014-09-28 NOTE — H&P (Signed)
PCP:   Ricke Hey, MD   Chief Complaint:  abd pain and dysuria  HPI: 78 yo female with several days of not feeling well, dysuria, epigastric abdominal pain with n/v and not able to eat.  Pt and daughter reports that she has been having decreased po intake for several days with this burning with urination, after that her upper abdomen started hurting and she has been getting very  Nauseas.  No fevers.  No chills.  No diarrhea.  She feels some better with ivf in the ED.  Found to have a uti.  Labs unremarkable.  She is hungry.  We are asked to admit patient for her sinus tachycardia of around 106.  Review of Systems:  Positive and negative as per HPI otherwise all other systems are negative  Past Medical History: Past Medical History  Diagnosis Date  . Constipation   . Hypertension   . Arthritis   . Nail fungus   . Diverticulosis of colon (without mention of hemorrhage)   . Breast cancer   . Uterine fibroid   . GERD (gastroesophageal reflux disease)    Past Surgical History  Procedure Laterality Date  . Right lumpectomy with right sentinel lymph node dissection  2004    Dr. Bubba Camp  . Cholecystectomy  ~1980  . Vaginal hysterectomy    . Colonoscopy      Hx; of  . Abdominal hysterectomy    . Endarterectomy Right 01/19/2014    Procedure: ENDARTERECTOMY CAROTID;  Surgeon: Serafina Mitchell, MD;  Location: [facility] OR;  Service: Vascular;  Laterality: Right;    Medications: Prior to Admission medications   Medication Sig Start Date End Date Taking? Authorizing Provider  aspirin EC 81 MG tablet Take 1 tablet (81 mg total) by mouth daily. 02/24/12  Yes [name], MD  pantoprazole (PROTONIX) 40 MG tablet Take 40 mg by mouth daily.   Yes Historical Provider, MD  trandolapril (MAVIK) 4 MG tablet Take 4 mg by mouth daily.    Yes Historical Provider, MD  verapamil (CALAN-SR) 240 MG CR tablet Take 240 mg by mouth 2 (two) times daily.    Yes Historical Provider, MD    Allergies:  No  Known Allergies  Social History:  reports that she has been smoking Cigarettes.  She has been smoking about 1.00 pack per day. She has never used smokeless tobacco. She reports that she does not drink alcohol or use illicit drugs.  Family History: Family History  Problem Relation Age of Onset  . Colon cancer Mother   . Colon cancer Maternal Grandmother     Physical Exam: Filed Vitals:   09/27/14 1729 09/27/14 2302  BP: 162/90 168/73  Pulse: 107 99  Temp: 98.3 F (36.8 C) 98.3 F (36.8 C)  TempSrc:  Oral  Resp: 20 16  SpO2: 100% 95%   General appearance: alert, cooperative and no distress Head: Normocephalic, without obvious abnormality, atraumatic Eyes: negative Nose: Nares normal. Septum midline. Mucosa normal. No drainage or sinus tenderness. Neck: no JVD and supple, symmetrical, trachea midline Lungs: clear to auscultation bilaterally Heart: regular rate and rhythm, S1, S2 normal, no murmur, click, rub or gallop Abdomen: soft, non-tender; bowel sounds normal; no masses,  no organomegaly Extremities: extremities normal, atraumatic, no cyanosis or edema Pulses: 2+ and symmetric Skin: Skin color, texture, turgor normal. No rashes or lesions Neurologic: Grossly normal    Labs on Admission:   Recent Labs  09/27/14 1826  NA 141  K 3.5*  CL 98  CO2 26  GLUCOSE 86  BUN 12  CREATININE 0.87  CALCIUM 9.9    Recent Labs  09/27/14 1826  AST 12  ALT 8  ALKPHOS 98  BILITOT 0.5  PROT 7.9  ALBUMIN 3.6    Recent Labs  09/27/14 1826  LIPASE 99*    Recent Labs  09/27/14 1826  WBC 7.9  NEUTROABS 4.5  HGB 14.9  HCT 45.5  MCV 81.0  PLT 232    Recent Labs  09/27/14 1829 09/27/14 2339  TROPONINI <0.30 <0.30   Radiological Exams on Admission: Dg Chest 2 View  09/27/2014   CLINICAL DATA:  78 year old female with epigastric pain increasing x2 days. Cough. Initial encounter. Personal history of breast cancer.  EXAM: CHEST  2 VIEW  COMPARISON:   01/12/2014 and earlier.  FINDINGS: Semi upright AP and lateral views of the chest. Stable lung volumes. Stable cardiac size and mediastinal contours. Visualized tracheal air column is within normal limits. No pneumothorax or pulmonary edema. No pleural effusion or consolidation. Chronic increased interstitial markings in both lungs are stable. Stable visualized osseous structures. Surgical clips in the right neck soft tissues may be new. No pneumoperitoneum identified.  IMPRESSION: No acute cardiopulmonary abnormality.   Electronically Signed   By: Lars Pinks M.D.   On: 09/27/2014 21:12   Ct Abdomen Pelvis W Contrast  09/27/2014   CLINICAL DATA:  78 year old female with acute epigastric pain and nausea. Burning with urination. Initial encounter.  EXAM: CT ABDOMEN AND PELVIS WITH CONTRAST  TECHNIQUE: Multidetector CT imaging of the abdomen and pelvis was performed using the standard protocol following bolus administration of intravenous contrast.  CONTRAST:  30mL OMNIPAQUE IOHEXOL 300 MG/ML SOLN, 162mL OMNIPAQUE IOHEXOL 300 MG/ML SOLN  COMPARISON:  CT Abdomen and Pelvis 12/10/2011 and earlier.  FINDINGS: Stable, negative lung bases.  No pericardial or pleural effusion.  Degenerative changes in the spine. No acute osseous abnormality identified.  Uterus and ovaries not identified, presumed surgically absent. There are multiple nondilated small bowel loops in the deep pelvis. Negative rectum.  The bladder is more decompressed, but appears thick walled and with some wall enhancement (series 2, image 66). No perivesical stranding. No gas within the bladder.  Redundant sigmoid colon with diverticulosis but no active inflammation. Diverticula continued to the splenic flexure along with retained stool in the colon. Oral contrast has reached the hepatic flexure. Normal right colon and appendix. No dilated small bowel. Decompressed stomach and duodenum.  Gallbladder surgically absent as before. Liver, spleen, pancreas, and  adrenal glands are within normal limits. Stable multiple exophytic renal cysts on the left. Multiple smaller right renal cysts also appears stable. Symmetric renal contrast excretion. No abdominal free fluid. Portal venous system within normal limits. Extensive Aortoiliac calcified atherosclerosis noted. Major arterial structures in the abdomen and pelvis are patent. No lymphadenopathy identified. Stable small fact containing umbilical hernia.  IMPRESSION: 1. Suggestion of bladder wall thickening and hyper enhancement such as due to UTI. 2. Otherwise no acute or inflammatory findings in the abdomen or pelvis.   Electronically Signed   By: Lars Pinks M.D.   On: 09/27/2014 22:31    Assessment/Plan  78 yo female with uti  Principal Problem:   UTI (lower urinary tract infection)-  Rocephin.  ivf overnight.  cx pending.  Active Problems:   Essential hypertension   CONSTIPATION, CHRONIC   Peripheral vascular disease Epigastric abd pain with n/v-  Improved, cont ppi, place on maalox.  abd exam benign.  Supportive care at  this time.  obs on medical.  Full code.  Lashe Oliveira A 09/28/2014, 1:46 AM

## 2014-09-29 DIAGNOSIS — Z720 Tobacco use: Secondary | ICD-10-CM

## 2014-09-29 DIAGNOSIS — K850 Idiopathic acute pancreatitis: Secondary | ICD-10-CM

## 2014-09-29 DIAGNOSIS — K859 Acute pancreatitis, unspecified: Secondary | ICD-10-CM | POA: Diagnosis not present

## 2014-09-29 DIAGNOSIS — E876 Hypokalemia: Secondary | ICD-10-CM

## 2014-09-29 DIAGNOSIS — R748 Abnormal levels of other serum enzymes: Secondary | ICD-10-CM

## 2014-09-29 DIAGNOSIS — R05 Cough: Secondary | ICD-10-CM

## 2014-09-29 DIAGNOSIS — I10 Essential (primary) hypertension: Secondary | ICD-10-CM

## 2014-09-29 LAB — BASIC METABOLIC PANEL
ANION GAP: 11 (ref 5–15)
BUN: 10 mg/dL (ref 6–23)
CHLORIDE: 107 meq/L (ref 96–112)
CO2: 22 meq/L (ref 19–32)
Calcium: 8.9 mg/dL (ref 8.4–10.5)
Creatinine, Ser: 0.77 mg/dL (ref 0.50–1.10)
GFR calc Af Amer: >90 mL/min (ref >90)
GFR calc non Af Amer: 80 mL/min — ABNORMAL LOW (ref >90)
Glucose, Bld: 85 mg/dL (ref 70–99)
Potassium: 4.7 mEq/L (ref 3.7–5.3)
Sodium: 140 mEq/L (ref 137–147)

## 2014-09-29 LAB — GLUCOSE, CAPILLARY: Glucose-Capillary: 114 mg/dL — ABNORMAL HIGH (ref 70–99)

## 2014-09-29 LAB — LIPID PANEL
Cholesterol: 127 mg/dL (ref 0–200)
HDL: 30 mg/dL — ABNORMAL LOW (ref >39)
LDL CALC: 69 mg/dL (ref 0–99)
TRIGLYCERIDES: 141 mg/dL (ref <150)
Total CHOL/HDL Ratio: 4.2 RATIO
VLDL: 28 mg/dL (ref 0–40)

## 2014-09-29 LAB — LIPASE, BLOOD: Lipase: 62 U/L — ABNORMAL HIGH (ref 11–59)

## 2014-09-29 MED ORDER — SODIUM CHLORIDE 0.9 % IV SOLN
INTRAVENOUS | Status: DC
  Administered 2014-09-29: 09:00:00 via INTRAVENOUS

## 2014-09-29 MED ORDER — HYDRALAZINE HCL 25 MG PO TABS
25.0000 mg | ORAL_TABLET | Freq: Three times a day (TID) | ORAL | Status: DC
  Administered 2014-09-29 – 2014-09-30 (×3): 25 mg via ORAL
  Filled 2014-09-29 (×7): qty 1

## 2014-09-29 NOTE — Progress Notes (Signed)
Pt with elevated BP 180/84 manual.  MD paged, new orders of hydralizine placed. Family/Pt educated.  Pt tolerated full liquid lunch without n/v or pain. Diet advanced to low fat diet per pt request and MD order

## 2014-09-29 NOTE — Progress Notes (Addendum)
Progress Note   Linda [name]:[id number] DOB: 1936-09-14 DOA: 09/27/2014 PCP: Ricke Hey, MD   Brief Narrative:   Linda [name] is an 78 y.o. female with a PMH of GERD, breast cancer status post right lumpectomy who was admitted 09/28/14 with generalized complaints of dysuria, epigastric abdominal pain with nausea and vomiting as well as anorexia. Upon initial evaluation in the ED, she was found to be tachycardic with heart rate of 106, hyponatremic with a potassium 3.5, lipase 99, chest x-ray negative, CT showing bladder wall thickening but was otherwise negative. Urinalysis was positive for protein and trace leukocytes.  Assessment/Plan:   Principal Problem:   Acute pancreatitis with abdominal pain /elevated lipase  Bowel rest with clear liquids ordered 10/23, advance to [facility] today and monitor.  Conservative care with IV fluids, pain medicines, and nausea medicines as needed.  Unclear trigger. The patient has no history of alcohol abuse and is status post cholecystectomy.  No hypertriglyceridemia.  Active Problems:   UTI (lower urinary tract infection)  On empiric Rocephin. Urine culture negative.  D/C Rocephin.    Essential hypertension  Continue Mavik and Calan-SR.  BP suboptimal.  Add Hydralazine.    CONSTIPATION, CHRONIC  Currently on a clear liquid diet. May need a bowel regimen when diet advanced.  Last BM was 3 days ago.     Peripheral vascular disease  Continue aspirin.    Hypokalemia  Repleting.    Tobacco Abuse  1 ppd habit.  Counseled.      DVT Prophylaxis  Continue Lovenox.  Code Status: Full. Family Communication: Daughter, April, at bedside. Disposition Plan: Home when stable.   IV Access:    Peripheral IV   Procedures and diagnostic studies:   Dg Chest 2 View 09/27/2014: No acute cardiopulmonary abnormality.   Ct Abdomen Pelvis W Contrast 09/27/2014: 1. Suggestion of bladder wall thickening and hyper enhancement such as  due to UTI. 2. Otherwise no acute or inflammatory findings in the abdomen or pelvis.   Medical Consultants:    None.  Anti-Infectives:    Rocephin 09/28/14--->09/29/14  Subjective:   Linda [name] denies abdominal pain today.  No further N/V.Linda [name]  No diarrhea.  LBM 3 days ago.  Cough noted.  Objective:    Filed Vitals:   09/28/14 0559 09/28/14 1349 09/28/14 2040 09/29/14 0415  BP: 162/85 149/108 146/57 133/55  Pulse: 95 112 75 72  Temp: 98.6 F (37 C) 97.4 F (36.3 C) 97.6 F (36.4 C) 98.7 F (37.1 C)  TempSrc: Oral Oral Oral Oral  Resp: 16 20 20 20   SpO2: 100% 97% 99% 100%    Intake/Output Summary (Last 24 hours) at 09/29/14 0751 Last data filed at 09/29/14 2355  Gross per 24 hour  Intake   1040 ml  Output      0 ml  Net   1040 ml    Exam: Gen:  NAD Cardiovascular:  RRR, No M/R/G Respiratory:  Lungs CTAB Gastrointestinal:  Abdomen soft, NT, + BS Extremities:  No C/E/C   Data Reviewed:    Labs: Basic Metabolic Panel:  Recent Labs Lab 09/27/14 1826 09/28/14 0345 09/29/14 0559  NA 141 138 140  K 3.5* 2.9* 4.7  CL 98 101 107  CO2 26 24 22   GLUCOSE 86 102* 85  BUN 12 10 10   CREATININE 0.87 0.81 0.77  CALCIUM 9.9 8.6 8.9   GFR The CrCl is unknown because both a height and weight (above a minimum accepted value)  are required for this calculation. Liver Function Tests:  Recent Labs Lab 09/27/14 1826  AST 12  ALT 8  ALKPHOS 98  BILITOT 0.5  PROT 7.9  ALBUMIN 3.6    Recent Labs Lab 09/27/14 1826 09/29/14 0559  LIPASE 99* 62*    CBC:  Recent Labs Lab 09/27/14 1826 09/28/14 0345  WBC 7.9 6.6  NEUTROABS 4.5  --   HGB 14.9 13.3  HCT 45.5 39.9  MCV 81.0 80.9  PLT 232 198   Cardiac Enzymes:  Recent Labs Lab 09/27/14 1829 09/27/14 2339  TROPONINI <0.30 <0.30   Sepsis Labs:  Recent Labs Lab 09/27/14 1826 09/28/14 0219 09/28/14 0345  WBC 7.9  --  6.6  LATICACIDVEN  --  0.69  --    Microbiology Recent Results (from  the past 240 hour(s))  URINE CULTURE     Status: None   Collection Time    09/27/14  8:44 PM      Result Value Ref Range Status   Specimen Description URINE, CLEAN CATCH   Final   Special Requests Normal   Final   Culture  Setup Time     Final   Value: 09/28/2014 00:50     Performed at [facility]     Final   Value: NO GROWTH     Performed at [facility]   Culture     Final   Value: NO GROWTH     Performed at [facility]   Report Status 09/28/2014 FINAL   Final     Medications:   . antiseptic oral rinse  7 mL Mouth Rinse BID  . aspirin EC  81 mg Oral Daily  . cefTRIAXone (ROCEPHIN)  IV  1 g Intravenous Q24H  . enoxaparin (LOVENOX) injection  40 mg Subcutaneous Daily  . Influenza vac split quadrivalent PF  0.5 mL Intramuscular Tomorrow-1000  . pantoprazole  40 mg Oral Daily  . pneumococcal 23 valent vaccine  0.5 mL Intramuscular Tomorrow-1000  . trandolapril  4 mg Oral Daily  . verapamil  240 mg Oral BID   Continuous Infusions: . 0.9 % NaCl with KCl 40 mEq / L 75 mL/hr (09/29/14 0118)    Time spent: 25 minutes.    LOS: 2 days   RAMA,CHRISTINA  [facility] Pager [phone]. If unable to reach me by pager, please call my cell phone at [phone].  *Please refer to [email or web address], password [email or web address] to get updated schedule on who will round on this patient, as hospitalists switch teams weekly. If 7PM-7AM, please contact night-coverage at [email or web address], password [email or web address] for any overnight needs.  09/29/2014, 7:51 AM

## 2014-09-29 NOTE — ED Provider Notes (Signed)
Medical screening examination/treatment/procedure(s) were conducted as a shared visit with non-physician practitioner(s) or resident and myself. I personally evaluated the patient during the encounter and agree with the findings.  I have personally reviewed any xrays and/ or EKG's with the provider and I agree with interpretation.  Patient with worsening upper abdominal pain for the past 2 weeks, and denies vomiting, fevers, chills or blood in the stool. On exam patient has mild to moderate epigastric and central abdominal pain. Discussed broad differential in with persistent worsening symptoms plan for CT abdomen pelvis for further delineation of pain. Blood work and urinalysis pending.  Labs Reviewed   LIPASE, BLOOD - Abnormal; Notable for the following:    Lipase  99 (*)     All other components within normal limits   COMPREHENSIVE METABOLIC PANEL - Abnormal; Notable for the following:    Potassium  3.5 (*)     GFR calc non Af Amer  63 (*)     GFR calc Af Amer  73 (*)     Anion gap  17 (*)     All other components within normal limits   CBC WITH DIFFERENTIAL - Abnormal; Notable for the following:    RBC  5.62 (*)     All other components within normal limits   TROPONIN I   URINALYSIS, ROUTINE W REFLEX MICROSCOPIC    Epigastric abdominal pain   Mariea Clonts, MD 09/29/14 [phone]

## 2014-09-30 DIAGNOSIS — I739 Peripheral vascular disease, unspecified: Secondary | ICD-10-CM

## 2014-09-30 DIAGNOSIS — K5909 Other constipation: Secondary | ICD-10-CM

## 2014-09-30 MED ORDER — HYDRALAZINE HCL 25 MG PO TABS: 25.0000 mg | ORAL_TABLET | Freq: Three times a day (TID) | ORAL | Status: DC

## 2014-09-30 NOTE — Progress Notes (Addendum)
Discharge instructions explained to pt and her daughter, daughter states understanding. Discharged via wheelchair. Prescriptions called in by MD. Medication sheet has when next doses are due

## 2014-09-30 NOTE — Discharge Instructions (Signed)
Acute Pancreatitis °Acute pancreatitis is a disease in which the pancreas becomes suddenly irritated (inflamed). The pancreas is a large gland behind your stomach. The pancreas makes enzymes that help digest food. The pancreas also makes 2 hormones that help control your blood sugar. Acute pancreatitis happens when the enzymes attack and damage the pancreas. Most attacks last a couple of days and can cause serious problems. °HOME CARE °· Follow your doctor's diet instructions. You may need to avoid alcohol and limit fat in your diet. °· Eat small meals often. °· Drink enough fluids to keep your pee (urine) clear or pale yellow. °· Only take medicines as told by your doctor. °· Avoid drinking alcohol if it caused your disease. °· Do not smoke. °· Get plenty of rest. °· Check your blood sugar at home as told by your doctor. °· Keep all doctor visits as told. °GET HELP IF: °· You do not get better as quickly as expected. °· You have new or worsening symptoms. °· You have lasting pain, weakness, or feel sick to your stomach (nauseous). °· You get better and then have another pain attack. °GET HELP RIGHT AWAY IF:  °· You are unable to eat or keep fluids down. °· Your pain becomes severe. °· You have a fever or lasting symptoms for more than 2 to 3 days. °· You have a fever and your symptoms suddenly get worse. °· Your skin or the white part of your eyes turn yellow (jaundice). °· You throw up (vomit). °· You feel dizzy, or you pass out (faint). °· Your blood sugar is high (over 300 mg/dL). °MAKE SURE YOU:  °· Understand these instructions. °· Will watch your condition. °· Will get help right away if you are not doing well or get worse. °Document Released: 05/11/2008 Document Revised: 04/09/2014 Document Reviewed: 03/03/2012 °Exit[employer]® Patient Information ©2015 [employer]. This information is not intended to replace advice given to you by your health care provider. Make sure you discuss any questions you have with your  health care provider. ° °

## 2014-09-30 NOTE — Plan of Care (Signed)
Problem: Discharge Progression Outcomes Goal: Discharge plan in place and appropriate Outcome: Completed/Met Date Met:  09/30/14 Discharged to daughter to go home

## 2014-09-30 NOTE — Progress Notes (Signed)
CARE MANAGEMENT NOTE 09/30/2014  Patient:  Linda [name],Linda [name]   Account Number:  [id number]  Date Initiated:  09/28/2014  Documentation initiated by:  Marney Doctor  Subjective/Objective Assessment:   78 yo admitted with abd pain and dysuria     Action/Plan:   From home with children   Anticipated DC Date:  10/01/2014   Anticipated DC Plan:  [place]  CM consult      Choice offered to / List presented to:             Status of service:  Completed, signed off Medicare Important Message given?  YES (If response is "NO", the following Medicare IM given date fields will be blank) Date Medicare IM given:  09/30/2014 Medicare IM given by:  [facility] Date Additional Medicare IM given:   Additional Medicare IM given by:    Discharge Disposition:    Per UR Regulation:  Reviewed for med. necessity/level of care/duration of stay  If discussed at [place] of Stay Meetings, dates discussed:    Comments:  09/30/2014 1000 NCM spoke to pt and no dc needs identified. Jonnie Finner RN CCM Case Mgmt phone [phone]  09/28/14 Marney Doctor RN,BSN,NCM [phone] Chart reviewed and CM following for DC needs.  Pt could potentially benefit from a PT/OT eval to help prepare for safe discharge plan.

## 2014-09-30 NOTE — Discharge Summary (Signed)
Physician Discharge Summary  Linda [name] WNI:[id number] DOB: 06/17/1936 DOA: 09/27/2014  PCP: Ricke Hey, MD  Admit date: 09/27/2014 Discharge date: 09/30/2014   Recommendations for Outpatient Follow-Up:   1. F/U with PCP for a BP check in 1-2 weeks, BP persistently elevated.  Hydralazine added to BP regimen.   Discharge Diagnosis:   Principal Problem:    Pancreatitis, acute Active Problems:    Essential hypertension    CONSTIPATION, CHRONIC    Peripheral vascular disease    Elevated lipase    Hypokalemia    Tobacco abuse   Discharge Condition: Improved.  Diet recommendation: Low sodium, heart healthy.     History of Present Illness:   Linda [name] is an 78 y.o. female with a PMH of GERD, breast cancer status post right lumpectomy who was admitted 09/28/14 with generalized complaints of dysuria, epigastric abdominal pain with nausea and vomiting as well as anorexia. Upon initial evaluation in the ED, she was found to be tachycardic with heart rate of 106, hyponatremic with a potassium 3.5, lipase 99, chest x-ray negative, CT showing bladder wall thickening but was otherwise negative. Urinalysis was positive for protein and trace leukocytes.  Hospital Course by Problem:   Principal Problem:  Acute pancreatitis with abdominal pain /elevated lipase  Treated conservatively with bowel rest, IVF and anti-emetics/pain medications.  Unclear trigger. The patient has no history of alcohol abuse and is status post cholecystectomy. No hypertriglyceridemia. Diet slowly advanced with good tolerance prior to D/C.  No abdominal pain at discharge.  Active Problems:  R/O UTI (lower urinary tract infection)  Initially placed on empiric Rocephin. Urine culture negative, so Rocephin discontinued 09/29/14.  Essential hypertension  Continue Mavik and Calan-SR. BP suboptimal. Added Hydralazine.  Needs close outpatient F/U.  CONSTIPATION, CHRONIC  Bowels moved prior to  D/C.  Peripheral vascular disease  Continue aspirin.  Hypokalemia  Repleted.  Tobacco Abuse  1 ppd habit. Counseled.   Medical Consultants:    None.   Discharge Exam:   Filed Vitals:   09/30/14 0603  BP: 176/90  Pulse: 88  Temp: 98.3 F (36.8 C)  Resp: 18   Filed Vitals:   09/29/14 1825 09/29/14 1850 09/29/14 2000 09/30/14 0603  BP:  159/62 175/66 176/90  Pulse:  84 90 88  Temp:   98.6 F (37 C) 98.3 F (36.8 C)  TempSrc:   Oral Oral  Resp:    18  Height: 5\' 4"  (1.626 m)     Weight: 72.938 kg (160 lb 12.8 oz)     SpO2:   98% 99%    Gen:  NAD Cardiovascular:  RRR, No M/R/G Respiratory: Lungs CTAB Gastrointestinal: Abdomen soft, NT/ND with normal active bowel sounds. Extremities: No C/E/C   The results of significant diagnostics from this hospitalization (including imaging, microbiology, ancillary and laboratory) are listed below for reference.     Procedures and Diagnostic Studies:   Dg Chest 2 View 09/27/2014: No acute cardiopulmonary abnormality.   Ct Abdomen Pelvis W Contrast 09/27/2014: 1. Suggestion of bladder wall thickening and hyper enhancement such as due to UTI. 2. Otherwise no acute or inflammatory findings in the abdomen or pelvis.    Labs:   Basic Metabolic Panel:  Recent Labs Lab 09/27/14 1826 09/28/14 0345 09/29/14 0559  NA 141 138 140  K 3.5* 2.9* 4.7  CL 98 101 107  CO2 26 24 22   GLUCOSE 86 102* 85  BUN 12 10 10   CREATININE 0.87 0.81 0.77  CALCIUM 9.9  8.6 8.9   GFR Estimated Creatinine Clearance: 58.6 ml/min (by C-G formula based on Cr of 0.77). Liver Function Tests:  Recent Labs Lab 09/27/14 1826  AST 12  ALT 8  ALKPHOS 98  BILITOT 0.5  PROT 7.9  ALBUMIN 3.6    Recent Labs Lab 09/27/14 1826 09/29/14 0559  LIPASE 99* 62*   CBC:  Recent Labs Lab 09/27/14 1826 09/28/14 0345  WBC 7.9 6.6  NEUTROABS 4.5  --   HGB 14.9 13.3  HCT 45.5 39.9  MCV 81.0 80.9  PLT 232 198   Cardiac Enzymes:  Recent  Labs Lab 09/27/14 1829 09/27/14 2339  TROPONINI <0.30 <0.30   CBG:  Recent Labs Lab 09/29/14 1959  GLUCAP 114*   Lipid Profile  Recent Labs  09/29/14 0559  CHOL 127  HDL 30*  LDLCALC 69  TRIG 141  CHOLHDL 4.2   Microbiology Recent Results (from the past 240 hour(s))  URINE CULTURE     Status: None   Collection Time    09/27/14  8:44 PM      Result Value Ref Range Status   Specimen Description URINE, CLEAN CATCH   Final   Special Requests Normal   Final   Culture  Setup Time     Final   Value: 09/28/2014 00:50     Performed at [facility]     Final   Value: NO GROWTH     Performed at [facility]   Culture     Final   Value: NO GROWTH     Performed at [facility]   Report Status 09/28/2014 FINAL   Final     Discharge Instructions:   Discharge Instructions   Activity as tolerated - No restrictions    Complete by:  As directed      Call MD for:  persistant nausea and vomiting    Complete by:  As directed      Call MD for:  severe uncontrolled pain    Complete by:  As directed      Call MD for:  temperature >100.4    Complete by:  As directed      Diet - low sodium heart healthy    Complete by:  As directed      Discharge instructions    Complete by:  As directed   Make sure you follow up with your primary care doctor in 1-2 weeks so that Dr. Noah Delaine can re-check your blood pressure.  Your goal blood pressure is less than 140/80.  You were cared for by Dr. Jacquelynn Cree  (a hospitalist) during your hospital stay. If you have any questions about your discharge medications or the care you received while you were in the hospital after you are discharged, you can call the unit and ask to speak with the hospitalist on call if the hospitalist that took care of you is not available. Once you are discharged, your primary care physician will handle any further medical issues. Please note that NO REFILLS for any discharge  medications will be authorized once you are discharged, as it is imperative that you return to your primary care physician (or establish a relationship with a primary care physician if you do not have one) for your aftercare needs so that they can reassess your need for medications and monitor your lab values.  Any outstanding tests can be reviewed by your PCP at your follow up visit.  It is also  important to review any medicine changes with your PCP.  Please bring these d/c instructions with you to your next visit so your physician can review these changes with you.            Medication List         aspirin EC 81 MG tablet  Take 1 tablet (81 mg total) by mouth daily.     hydrALAZINE 25 MG tablet  Commonly known as:  APRESOLINE  Take 1 tablet (25 mg total) by mouth every 8 (eight) hours.     pantoprazole 40 MG tablet  Commonly known as:  PROTONIX  Take 40 mg by mouth daily.     trandolapril 4 MG tablet  Commonly known as:  MAVIK  Take 4 mg by mouth daily.     verapamil 240 MG CR tablet  Commonly known as:  CALAN-SR  Take 240 mg by mouth 2 (two) times daily.           Follow-up Information   Follow up with Ricke Hey, MD. Schedule an appointment as soon as possible for a visit in 2 weeks. (Blood pressure check.)    Specialty:  Family Medicine   Contact information:   [address] [place] [address] [phone]        Time coordinating discharge: 35 minutes.  Signed:  RAMA,CHRISTINA  Pager [phone] [facility] 09/30/2014, 9:47 AM

## 2015-06-22 ENCOUNTER — Encounter (HOSPITAL_COMMUNITY): Payer: Self-pay | Admitting: Nurse Practitioner

## 2015-06-22 ENCOUNTER — Emergency Department (HOSPITAL_COMMUNITY)
Admission: EM | Admit: 2015-06-22 | Discharge: 2015-06-22 | Disposition: A | Discharge status: Home / Self Care | Payer: Medicare Other | Attending: Emergency Medicine | Admitting: Emergency Medicine

## 2015-06-22 DIAGNOSIS — K5900 Constipation, unspecified: Secondary | ICD-10-CM | POA: Insufficient documentation

## 2015-06-22 DIAGNOSIS — Z720 Tobacco use: Secondary | ICD-10-CM | POA: Diagnosis not present

## 2015-06-22 DIAGNOSIS — I10 Essential (primary) hypertension: Secondary | ICD-10-CM | POA: Diagnosis not present

## 2015-06-22 DIAGNOSIS — M1990 Unspecified osteoarthritis, unspecified site: Secondary | ICD-10-CM | POA: Diagnosis not present

## 2015-06-22 DIAGNOSIS — Z853 Personal history of malignant neoplasm of breast: Secondary | ICD-10-CM | POA: Diagnosis not present

## 2015-06-22 DIAGNOSIS — K219 Gastro-esophageal reflux disease without esophagitis: Secondary | ICD-10-CM | POA: Diagnosis not present

## 2015-06-22 DIAGNOSIS — Z7982 Long term (current) use of aspirin: Secondary | ICD-10-CM | POA: Diagnosis not present

## 2015-06-22 DIAGNOSIS — B029 Zoster without complications: Secondary | ICD-10-CM | POA: Diagnosis not present

## 2015-06-22 DIAGNOSIS — R21 Rash and other nonspecific skin eruption: Secondary | ICD-10-CM | POA: Diagnosis present

## 2015-06-22 MED ORDER — TETRACAINE HCL 0.5 % OP SOLN
2.0000 [drp] | Freq: Once | OPHTHALMIC | Status: AC
  Administered 2015-06-22: 2 [drp] via OPHTHALMIC
  Filled 2015-06-22: qty 2

## 2015-06-22 MED ORDER — VALACYCLOVIR HCL 1 G PO TABS: 1000.0000 mg | ORAL_TABLET | Freq: Three times a day (TID) | ORAL | Status: AC

## 2015-06-22 MED ORDER — ARTIFICIAL TEARS 0.1-0.3 % OP SOLN: 1.0000 [drp] | Freq: Three times a day (TID) | OPHTHALMIC | Status: DC

## 2015-06-22 MED ORDER — ERYTHROMYCIN 5 MG/GM OP OINT
1.0000 "application " | TOPICAL_OINTMENT | Freq: Four times a day (QID) | OPHTHALMIC | Status: DC | PRN
  Administered 2015-06-22: 1 via OPHTHALMIC
  Filled 2015-06-22: qty 3.5

## 2015-06-22 MED ORDER — FLUORESCEIN SODIUM 1 MG OP STRP
1.0000 | ORAL_STRIP | Freq: Once | OPHTHALMIC | Status: AC
  Administered 2015-06-22: 1 via OPHTHALMIC
  Filled 2015-06-22: qty 1

## 2015-06-22 NOTE — ED Provider Notes (Signed)
CSN: [id number]     Arrival date & time 06/22/15  1110 History  This chart was scribed for [name], PA-C, working with Linda Fraise, MD by Linda [name], ED Scribe. The patient was seen in room TR08C/TR08C at 12:10 PM.     Chief Complaint  Patient presents with  . Rash    The history is provided by the patient. No language interpreter was used.    Linda [name] is a 79 y.o. female with a medical hx of HTN who presents to the Emergency Department complaining of rash onset yesterday. Pt notes that she noticed the rash yesterday and it began with a HA 2 days ago and there was no initial tingling sensation. Pt reports that the to the left side of her face and forehead. Pt denies any pain to the area. Pt daughter notes that her left eyelid area was swelling due to the rash area. Pt is having associated symptoms  left eyelid swelling. She denies visual disturbance and any other symptoms. Pt PCP is Linda [name]. Pt has not taken her HTN this morning.   Past Medical History  Diagnosis Date  . Constipation   . Hypertension   . Arthritis   . Nail fungus   . Diverticulosis of colon (without mention of hemorrhage)   . Breast cancer   . Uterine fibroid   . GERD (gastroesophageal reflux disease)    Past Surgical History  Procedure Laterality Date  . Right lumpectomy with right sentinel lymph node dissection  2004    Dr. Bubba Camp  . Cholecystectomy  ~1980  . Vaginal hysterectomy    . Colonoscopy      Hx; of  . Abdominal hysterectomy    . Endarterectomy Right 01/19/2014    Procedure: ENDARTERECTOMY CAROTID;  Surgeon: Serafina Mitchell, MD;  Location: [facility] OR;  Service: Vascular;  Laterality: Right;   Family History  Problem Relation Age of Onset  . Colon cancer Mother   . Colon cancer Maternal Grandmother    History  Substance Use Topics  . Smoking status: Current Every Day Smoker -- 1.00 packs/day    Types: Cigarettes  . Smokeless tobacco: Never Used     Comment: form given 12/14/11  .  Alcohol Use: No   OB History    No data available     Review of Systems  Constitutional: Negative for fever.  HENT: Negative for trouble swallowing.   Eyes: Negative for visual disturbance.       Left eyelid swelling  Respiratory: Negative for shortness of breath.   Cardiovascular: Negative for chest pain.  Gastrointestinal: Negative for nausea, vomiting and abdominal pain.  Genitourinary: Negative for dysuria.  Musculoskeletal: Negative for neck pain.  Skin: Positive for rash. Negative for color change.  Neurological: Negative for dizziness, weakness, numbness and headaches.  Psychiatric/Behavioral: Negative.     Allergies  Review of patient's allergies indicates no known allergies.  Home Medications   Prior to Admission medications   Medication Sig Start Date End Date Taking? Authorizing Provider  ARTIFICIAL TEARS 0.1-0.3 % SOLN Apply 1 drop to eye 3 (three) times daily. 06/22/15   Linda Bailiff, PA-C  aspirin EC 81 MG tablet Take 1 tablet (81 mg total) by mouth daily. 02/24/12   Linda Mocha, MD  hydrALAZINE (APRESOLINE) 25 MG tablet Take 1 tablet (25 mg total) by mouth every 8 (eight) hours. 09/30/14   Linda P Rama, MD  pantoprazole (PROTONIX) 40 MG tablet Take 40 mg by mouth daily.  Historical Provider, MD  trandolapril (MAVIK) 4 MG tablet Take 4 mg by mouth daily.     Historical Provider, MD  valACYclovir (VALTREX) 1000 MG tablet Take 1 tablet (1,000 mg total) by mouth 3 (three) times daily. 06/22/15 07/06/15  Linda Bailiff, PA-C  verapamil (CALAN-SR) 240 MG CR tablet Take 240 mg by mouth 2 (two) times daily.     Historical Provider, MD   BP 230/98 mmHg  Pulse 97  Temp(Src) 98.5 F (36.9 C)  Resp 16  Ht 5\' 3"  (1.6 m)  Wt 160 lb (72.576 kg)  BMI 28.35 kg/m2  SpO2 99% Physical Exam  Constitutional: She is oriented to person, place, and time. She appears well-developed and well-nourished. No distress.  HENT:  Head: Normocephalic and atraumatic.  Right Ear: Tympanic  membrane and ear canal normal.  Left Ear: Tympanic membrane and ear canal normal.  Mouth/Throat: Uvula is midline, oropharynx is clear and moist and mucous membranes are normal. No oropharyngeal exudate.  Eyes: Conjunctivae and EOM are normal. Pupils are equal, round, and reactive to light. Right eye exhibits no discharge. Left eye exhibits no discharge. No scleral icterus.  Slit lamp exam:      The left eye shows no corneal abrasion, no corneal flare, no corneal ulcer, no foreign body, no hyphema, no hypopyon, no fluorescein uptake and no anterior chamber bulge.  Neck: Normal range of motion. Neck supple. No tracheal deviation present.  Cardiovascular: Normal rate, regular rhythm and normal heart sounds.   No murmur heard. Pulmonary/Chest: Effort normal and breath sounds normal. No respiratory distress.  Abdominal: Soft. There is no tenderness.  Musculoskeletal: Normal range of motion. She exhibits no edema or tenderness.  Neurological: She is alert and oriented to person, place, and time. No cranial nerve deficit. Coordination normal.  Skin: Skin is warm and dry. Rash noted. Rash is vesicular. She is not diaphoretic.  Vesicular raised mildly erythematous rash to the V1 distribution of left side of face.   Psychiatric: She has a normal mood and affect. Her behavior is normal.  Nursing note and vitals reviewed.   ED Course  Procedures (including critical care time) DIAGNOSTIC STUDIES: Oxygen Saturation is 100% on RA, nl by my interpretation.    COORDINATION OF CARE: 12:14 PM-Discussed treatment plan with pt at bedside and pt agreed to plan.   Labs Review Labs Reviewed - No data to display  Imaging Review No results found.   EKG Interpretation None      MDM   Final diagnoses:  Shingles    Patient here with rash consistent with herpes zoster/shingles. This rash is in a V1 facial distribution surrounding patient's eyelid, left eye and forehead. Patient has normal visual  acuity, unremarkable Fluorescein exam. No dendritic lesions noted. Although do not believe there is ocular involvement at this time, due to the dermatomal distribution, there is concern for progression to ophthalmological pathology. Because of this we contacted Dr. Ellie Lunch with ophthalmology in consultation patient's case. She recommends to place patient on Valtrex 1000 mg every 8 hours, given erythromycin ointment 4 when necessary use in the event of opening vesicles on patient's eyelid or irritation in her left eye. She recommends artificial tears, and follow-up with ophthalmology on Monday morning at 8:30 AM. I discussed this with patient, she agrees to follow-up on Monday morning. Patient is hemodynamic was stable and in no acute distress. Patient noted to be consistently hypertensive during her stay here.  No signs of hypertensive urgency.  Discussed with patient the  need for close follow-up and management by their primary care physician. Patient states that she did not take her antihypertensives today, insists this is a normal blood pressure for her when she does not take antihypertensives. Strict return precautions discussed, patient verbalizes understanding and agreement of this plan.  I personally performed the services described in this documentation, which was scribed in my presence. The recorded information has been reviewed and is accurate.  BP 230/98 mmHg  Pulse 97  Temp(Src) 98.5 F (36.9 C)  Resp 16  Ht 5\' 3"  (1.6 m)  Wt 160 lb (72.576 kg)  BMI 28.35 kg/m2  SpO2 99%  Signed,  Linda Bailiff, PA-C 6:26 PM  Patient seen and discussed with Dr. Ripley [name], M.D.   Linda Bailiff, PA-C 06/22/15 1826  Linda Fraise, MD 06/23/15 1230

## 2015-06-22 NOTE — Discharge Instructions (Signed)

## 2015-06-22 NOTE — ED Provider Notes (Signed)
Patient seen/examined in the Emergency Department in conjunction with Midlevel Provider  Patient reports painful rash to scalp Exam : pt with zoster noted to left side of forehead Plan: recommend visual acuity, fluorescin, start oral acyclovir and eye consult    [name], MD 06/22/15 1243

## 2015-06-22 NOTE — ED Notes (Signed)
Pt reports she woke up this am with blistered nonpainful rash to L side of face and forehead. Denies visual changes.

## 2015-06-22 NOTE — ED Notes (Signed)
Declined W/C at D/C and was escorted to lobby by RN. 

## 2015-08-23 ENCOUNTER — Encounter: Payer: Self-pay | Admitting: Family

## 2015-08-26 ENCOUNTER — Encounter: Payer: Self-pay | Admitting: Family

## 2015-08-26 ENCOUNTER — Ambulatory Visit (HOSPITAL_COMMUNITY)
Admission: RE | Admit: 2015-08-26 | Discharge: 2015-08-26 | Disposition: A | Discharge status: Home / Self Care | Payer: Medicare Other | Source: Ambulatory Visit | Attending: Surgery | Admitting: Surgery

## 2015-08-26 ENCOUNTER — Ambulatory Visit (INDEPENDENT_AMBULATORY_CARE_PROVIDER_SITE_OTHER): Payer: Medicare Other | Admitting: Family

## 2015-08-26 VITALS — BP 177/81 | HR 78 | Temp 97.1°F | Resp 16 | Ht 63.0 in | Wt 165.3 lb

## 2015-08-26 DIAGNOSIS — I6522 Occlusion and stenosis of left carotid artery: Secondary | ICD-10-CM | POA: Insufficient documentation

## 2015-08-26 DIAGNOSIS — Z9889 Other specified postprocedural states: Secondary | ICD-10-CM | POA: Diagnosis not present

## 2015-08-26 DIAGNOSIS — I10 Essential (primary) hypertension: Secondary | ICD-10-CM

## 2015-08-26 DIAGNOSIS — I6523 Occlusion and stenosis of bilateral carotid arteries: Secondary | ICD-10-CM | POA: Diagnosis not present

## 2015-08-26 DIAGNOSIS — Z720 Tobacco use: Secondary | ICD-10-CM

## 2015-08-26 DIAGNOSIS — Z48812 Encounter for surgical aftercare following surgery on the circulatory system: Secondary | ICD-10-CM

## 2015-08-26 DIAGNOSIS — F17200 Nicotine dependence, unspecified, uncomplicated: Secondary | ICD-10-CM

## 2015-08-26 NOTE — Progress Notes (Signed)
Established Carotid Patient   History of Present Illness  Linda [name] is a 79 y.o. female patient of Dr. Trula Slade who is back today for followup. On 01/19/2014 she underwent right carotid endarterectomy with bovine pericardial patch angioplasty. Dr. Trula Slade had to resect approximately 2 cm of the internal carotid artery which was primarily repaired. This was secondary to a kink. Intraoperative findings included 90% stenosis. The patient's postoperative course was uncomplicated.   The patient denies any history of TIA or stroke symptoms, specifically the patient denies a history of amaurosis fugax or monocular blindness, denies a history unilateral  of facial drooping, denies a history of hemiplegia, and denies a history of receptive or expressive aphasia.    Pt denies any cardiac problems. Pt denies claudication symptoms with walking.  She has a goiter, denies dysphagia, denies palpitations, denies feeling excessively hot, denies unintentional weight loss.  The patient reports New Medical or Surgical History: shingles on the left side of her forehead since August 2016, is being addressed by her PCP per pt.  Is taking antibiotics for a UTI.  She is taking 4 medications for hypertension, states this has been difficult to control, is managed by her PCP. Pt denies any history of ultrasound being performed to check her kidney arteries, no renal artery ultrasound results on file.  Pt Diabetic: no Pt smoker: smoker  (1.5 ppd, started about age 85 yrs)  Pt meds include: Statin : no, states her cholesterol is good ASA: yes Other anticoagulants/antiplatelets: no   Past Medical History  Diagnosis Date  . Constipation   . Hypertension   . Arthritis   . Nail fungus   . Diverticulosis of colon (without mention of hemorrhage)   . Breast cancer   . Uterine fibroid   . GERD (gastroesophageal reflux disease)     Social History Social History  Substance Use Topics  . Smoking status:  Current Every Day Smoker -- 1.00 packs/day    Types: Cigarettes  . Smokeless tobacco: Never Used     Comment: form given 12/14/11  . Alcohol Use: No    Family History Family History  Problem Relation Age of Onset  . Colon cancer Mother   . Colon cancer Maternal Grandmother     Surgical History Past Surgical History  Procedure Laterality Date  . Right lumpectomy with right sentinel lymph node dissection  2004    Dr. Bubba Camp  . Cholecystectomy  ~1980  . Vaginal hysterectomy    . Colonoscopy      Hx; of  . Abdominal hysterectomy    . Endarterectomy Right 01/19/2014    Procedure: ENDARTERECTOMY CAROTID;  Surgeon: Serafina Mitchell, MD;  Location: [facility] OR;  Service: Vascular;  Laterality: Right;    No Known Allergies  Current Outpatient Prescriptions  Medication Sig Dispense Refill  . ARTIFICIAL TEARS 0.1-0.3 % SOLN Apply 1 drop to eye 3 (three) times daily. 30 mL 0  . aspirin EC 81 MG tablet Take 1 tablet (81 mg total) by mouth daily. 1 tablet 0  . hydrALAZINE (APRESOLINE) 25 MG tablet Take 1 tablet (25 mg total) by mouth every 8 (eight) hours. 90 tablet 3  . pantoprazole (PROTONIX) 40 MG tablet Take 40 mg by mouth daily.    . trandolapril (MAVIK) 4 MG tablet Take 4 mg by mouth daily.     . verapamil (CALAN-SR) 240 MG CR tablet Take 240 mg by mouth 2 (two) times daily.     . [DISCONTINUED] omeprazole (PRILOSEC) 20 MG capsule  Take 1 capsule (20 mg total) by mouth daily. 30 capsule 0   No current facility-administered medications for this visit.    Review of Systems : See HPI for pertinent positives and negatives.  Physical Examination  Filed Vitals:   08/26/15 1357 08/26/15 1400  BP: 188/87 177/81  Pulse: 78   Temp: 97.1 F (36.2 C)   TempSrc: Oral   Resp: 16   Height: 5\' 3"  (1.6 m)   Weight: 165 lb 4.8 oz (74.98 kg)   SpO2: 98%    Body mass index is 29.29 kg/(m^2).  General: WDWN female in NAD Neck: goiter GAIT: antalgic, using cane Eyes: PERRLA Pulmonary:   Non-labored, CTAB, no  Rales, no rhonchi, & no wheezing.  Cardiac: regular rhythm,  no detected murmur.  VASCULAR EXAM Carotid Bruits Right Left   Negative Negative    Aorta is not palpable. Radial pulses are 2+ palpable and equal.                                                                                                                            LE Pulses Right Left       POPLITEAL  not palpable   not palpable       POSTERIOR TIBIAL  not palpable   not palpable        DORSALIS PEDIS      ANTERIOR TIBIAL not palpable  not palpable     Gastrointestinal: soft, nontender, BS WNL, no r/g,  no palpable masses.  Musculoskeletal: No muscle atrophy/wasting. M/S 5/5 throughout, extremities without ischemic changes.  Neurologic: A&O X 3; Appropriate Affect, Speech is normal CN 2-12 intact, pain and light touch intact in extremities, Motor exam as listed above.   Non-Invasive Vascular Imaging (08/26/2015): CEREBROVASCULAR DUPLEX EVALUATION    INDICATION: Carotid artery disease    PREVIOUS INTERVENTION(S): Right carotid endarterectomy 01/19/2014    DUPLEX EXAM: Carotid duplex    RIGHT  LEFT  Peak Systolic Velocities (cm/s) End Diastolic Velocities (cm/s) Plaque LOCATION Peak Systolic Velocities (cm/s) End Diastolic Velocities (cm/s) Plaque  133 13 - CCA PROXIMAL 150 18 HT  80 11 - CCA MID 83 13 HT  65 8 HT CCA DISTAL 90 15 -  106 5 - ECA 103 6 -  71 17 - ICA PROXIMAL 135 31 HT  71 17 - ICA MID 137 20 -  74 18 - ICA DISTAL 88 16 -    N/A ICA / CCA Ratio (PSV) 1.6  Antegrade Vertebral Flow Antegrade  - Brachial Systolic Pressure (mmHg) -  Triphasic Brachial Artery Waveforms Triphasic    Plaque Morphology:  HM = Homogeneous, HT = Heterogeneous, CP = Calcific Plaque, SP = Smooth Plaque, IP = Irregular Plaque     ADDITIONAL FINDINGS: Right subclavian 135 cm/s, left subclavian 118 cm/s    IMPRESSION: 1. Patent right carotid endarterectomy with internal carotid artery  resection, 2. Less than 40% left internal carotid artery stenosis  Compared to the previous exam:  No significant change     Assessment: Linda [name] is a 79 y.o. female who is s/p right carotid endarterectomy on 01/19/2014. She has no history of stroke or TIA.  Today's carotid duplex suggests a patent right carotid endarterectomy with internal carotid artery resection, less than 40% left internal carotid artery stenosis. No significant change from the last duplex.  Pt is taking four antihypertensive medications, states her blood pressure has been difficult to control. Nevertheless I advised pt to see her PCP ASAP re her elevated blood pressure. Will also schedule pt for a renal artery ultrasound to evaluate whether she has renal artery stenosis as the possible etiology of her uncontrolled hypertension (discussed with Dr. Trula Slade).  Fortunately she does not have DM, unfortunately she continues to smoke, see Plan.  Face to face time with patient was 25 minutes. Over 50% of this time was spent on counseling and coordination of care.   Plan:  The patient was counseled re smoking cessation and given several free resources re smoking cessation.   Follow-up in 1 year with Carotid Duplex, 1 month for bilateral renal artery ultrasound.  I discussed in depth with the patient the nature of atherosclerosis, and emphasized the importance of maximal medical management including strict control of blood pressure, blood glucose, and lipid levels, obtaining regular exercise, and cessation of smoking.  The patient is aware that without maximal medical management the underlying atherosclerotic disease process will progress, limiting the benefit of any interventions. The patient was given information about stroke prevention and what symptoms should prompt the patient to seek immediate medical care. Thank you for allowing [place] to participate in this patient's care.    Clemon Chambers, RN, MSN,  FNP-C [facility] Office: [facility] Physician: Trula Slade  08/26/2015 1:29 PM

## 2015-08-26 NOTE — Patient Instructions (Addendum)
Stroke Prevention Some medical conditions and behaviors are associated with an increased chance of having a stroke. You may prevent a stroke by making healthy choices and managing medical conditions. HOW CAN I REDUCE MY RISK OF HAVING A STROKE?   Stay physically active. Get at least 30 minutes of activity on most or all days.  Do not smoke. It may also be helpful to avoid exposure to secondhand smoke.  Limit alcohol use. Moderate alcohol use is considered to be:  No more than 2 drinks per day for men.  No more than 1 drink per day for nonpregnant women.  Eat healthy foods. This involves:  Eating 5 or more servings of fruits and vegetables a day.  Making dietary changes that address high blood pressure (hypertension), high cholesterol, diabetes, or obesity.  Manage your cholesterol levels.  Making food choices that are high in fiber and low in saturated fat, trans fat, and cholesterol may control cholesterol levels.  Take any prescribed medicines to control cholesterol as directed by your health care Bobbi Yount.  Manage your diabetes.  Controlling your carbohydrate and sugar intake is recommended to manage diabetes.  Take any prescribed medicines to control diabetes as directed by your health care Meng Winterton.  Control your hypertension.  Making food choices that are low in salt (sodium), saturated fat, trans fat, and cholesterol is recommended to manage hypertension.  Take any prescribed medicines to control hypertension as directed by your health care Alana Dayton.  Maintain a healthy weight.  Reducing calorie intake and making food choices that are low in sodium, saturated fat, trans fat, and cholesterol are recommended to manage weight.  Stop drug abuse.  Avoid taking birth control pills.  Talk to your health care Luetta Piazza about the risks of taking birth control pills if you are over 35 years old, smoke, get migraines, or have ever had a blood clot.  Get evaluated for sleep  disorders (sleep apnea).  Talk to your health care Hayli Milligan about getting a sleep evaluation if you snore a lot or have excessive sleepiness.  Take medicines only as directed by your health care Melisha Eggleton.  For some people, aspirin or blood thinners (anticoagulants) are helpful in reducing the risk of forming abnormal blood clots that can lead to stroke. If you have the irregular heart rhythm of atrial fibrillation, you should be on a blood thinner unless there is a good reason you cannot take them.  Understand all your medicine instructions.  Make sure that other conditions (such as anemia or atherosclerosis) are addressed. SEEK IMMEDIATE MEDICAL CARE IF:   You have sudden weakness or numbness of the face, arm, or leg, especially on one side of the body.  Your face or eyelid droops to one side.  You have sudden confusion.  You have trouble speaking (aphasia) or understanding.  You have sudden trouble seeing in one or both eyes.  You have sudden trouble walking.  You have dizziness.  You have a loss of balance or coordination.  You have a sudden, severe headache with no known cause.  You have new chest pain or an irregular heartbeat. Any of these symptoms may represent a serious problem that is an emergency. Do not wait to see if the symptoms will go away. Get medical help at once. Call your local emergency services (911 in [place] Do not drive yourself to the hospital. Document Released: 12/31/2004 Document Revised: 04/09/2014 Document Reviewed: 05/26/2013 [employer] Patient Information 2015 [employer]. This information is not intended to replace advice given   to you by your health care Roth Ress. Make sure you discuss any questions you have with your health care Audel Coakley.    Smoking Cessation Quitting smoking is important to your health and has many advantages. However, it is not always easy to quit since nicotine is a very addictive drug. Oftentimes, people try 3 times or  more before being able to quit. This document explains the best ways for you to prepare to quit smoking. Quitting takes hard work and a lot of effort, but you can do it. ADVANTAGES OF QUITTING SMOKING  You will live longer, feel better, and live better.  Your body will feel the impact of quitting smoking almost immediately.  Within 20 minutes, blood pressure decreases. Your pulse returns to its normal level.  After 8 hours, carbon monoxide levels in the blood return to normal. Your oxygen level increases.  After 24 hours, the chance of having a heart attack starts to decrease. Your breath, hair, and body stop smelling like smoke.  After 48 hours, damaged nerve endings begin to recover. Your sense of taste and smell improve.  After 72 hours, the body is virtually free of nicotine. Your bronchial tubes relax and breathing becomes easier.  After 2 to 12 weeks, lungs can hold more air. Exercise becomes easier and circulation improves.  The risk of having a heart attack, stroke, cancer, or lung disease is greatly reduced.  After 1 year, the risk of coronary heart disease is cut in half.  After 5 years, the risk of stroke falls to the same as a nonsmoker.  After 10 years, the risk of lung cancer is cut in half and the risk of other cancers decreases significantly.  After 15 years, the risk of coronary heart disease drops, usually to the level of a nonsmoker.  If you are pregnant, quitting smoking will improve your chances of having a healthy baby.  The people you live with, especially any children, will be healthier.  You will have extra money to spend on things other than cigarettes. QUESTIONS TO THINK ABOUT BEFORE ATTEMPTING TO QUIT You may want to talk about your answers with your health care Meekah Math.  Why do you want to quit?  If you tried to quit in the past, what helped and what did not?  What will be the most difficult situations for you after you quit? How will you plan to  handle them?  Who can help you through the tough times? Your family? Friends? A health care Aftin Lye?  What pleasures do you get from smoking? What ways can you still get pleasure if you quit? Here are some questions to ask your health care Damira Kem:  How can you help me to be successful at quitting?  What medicine do you think would be best for me and how should I take it?  What should I do if I need more help?  What is smoking withdrawal like? How can I get information on withdrawal? GET READY  Set a quit date.  Change your environment by getting rid of all cigarettes, ashtrays, matches, and lighters in your home, car, or work. Do not let people smoke in your home.  Review your past attempts to quit. Think about what worked and what did not. GET SUPPORT AND ENCOURAGEMENT You have a better chance of being successful if you have help. You can get support in many ways.  Tell your family, friends, and coworkers that you are going to quit and need their support. Ask them   not to smoke around you.  Get individual, group, or telephone counseling and support. Programs are available at local hospitals and health centers. Call your local health department for information about programs in your area.  Spiritual beliefs and practices may help some smokers quit.  Download a "quit meter" on your computer to keep track of quit statistics, such as how long you have gone without smoking, cigarettes not smoked, and money saved.  Get a self-help book about quitting smoking and staying off tobacco. LEARN NEW SKILLS AND BEHAVIORS  Distract yourself from urges to smoke. Talk to someone, go for a walk, or occupy your time with a task.  Change your normal routine. Take a different route to work. Drink tea instead of coffee. Eat breakfast in a different place.  Reduce your stress. Take a hot bath, exercise, or read a book.  Plan something enjoyable to do every day. Reward yourself for not  smoking.  Explore interactive web-based programs that specialize in helping you quit. GET MEDICINE AND USE IT CORRECTLY Medicines can help you stop smoking and decrease the urge to smoke. Combining medicine with the above behavioral methods and support can greatly increase your chances of successfully quitting smoking.  Nicotine replacement therapy helps deliver nicotine to your body without the negative effects and risks of smoking. Nicotine replacement therapy includes nicotine gum, lozenges, inhalers, nasal sprays, and skin patches. Some may be available over-the-counter and others require a prescription.  Antidepressant medicine helps people abstain from smoking, but how this works is unknown. This medicine is available by prescription.  Nicotinic receptor partial agonist medicine simulates the effect of nicotine in your brain. This medicine is available by prescription. Ask your health care Kaylin Marcon for advice about which medicines to use and how to use them based on your health history. Your health care Airon Sahni will tell you what side effects to look out for if you choose to be on a medicine or therapy. Carefully read the information on the package. Do not use any other product containing nicotine while using a nicotine replacement product.  RELAPSE OR DIFFICULT SITUATIONS Most relapses occur within the first 3 months after quitting. Do not be discouraged if you start smoking again. Remember, most people try several times before finally quitting. You may have symptoms of withdrawal because your body is used to nicotine. You may crave cigarettes, be irritable, feel very hungry, cough often, get headaches, or have difficulty concentrating. The withdrawal symptoms are only temporary. They are strongest when you first quit, but they will go away within 10-14 days. To reduce the chances of relapse, try to:  Avoid drinking alcohol. Drinking lowers your chances of successfully quitting.  Reduce the  amount of caffeine you consume. Once you quit smoking, the amount of caffeine in your body increases and can give you symptoms, such as a rapid heartbeat, sweating, and anxiety.  Avoid smokers because they can make you want to smoke.  Do not let weight gain distract you. Many smokers will gain weight when they quit, usually less than 10 pounds. Eat a healthy diet and stay active. You can always lose the weight gained after you quit.  Find ways to improve your mood other than smoking. FOR MORE INFORMATION  www.smokefree.gov  Document Released: 11/17/2001 Document Revised: 04/09/2014 Document Reviewed: 03/03/2012 Exit[employer] Patient Information 2015 [employer]. This information is not intended to replace advice given to you by your health care Mariona Scholes. Make sure you discuss any questions you have with your health   care Rommel Hogston.    Smoking Cessation, Tips for Success If you are ready to quit smoking, congratulations! You have chosen to help yourself be healthier. Cigarettes bring nicotine, tar, carbon monoxide, and other irritants into your body. Your lungs, heart, and blood vessels will be able to work better without these poisons. There are many different ways to quit smoking. Nicotine gum, nicotine patches, a nicotine inhaler, or nicotine nasal spray can help with physical craving. Hypnosis, support groups, and medicines help break the habit of smoking. WHAT THINGS CAN I DO TO MAKE QUITTING EASIER?  Here are some tips to help you quit for good:  Pick a date when you will quit smoking completely. Tell all of your friends and family about your plan to quit on that date.  Do not try to slowly cut down on the number of cigarettes you are smoking. Pick a quit date and quit smoking completely starting on that day.  Throw away all cigarettes.   Clean and remove all ashtrays from your home, work, and car.  On a card, write down your reasons for quitting. Carry the card with you and read it  when you get the urge to smoke.  Cleanse your body of nicotine. Drink enough water and fluids to keep your urine clear or pale yellow. Do this after quitting to flush the nicotine from your body.  Learn to predict your moods. Do not let a bad situation be your excuse to have a cigarette. Some situations in your life might tempt you into wanting a cigarette.  Never have "just one" cigarette. It leads to wanting another and another. Remind yourself of your decision to quit.  Change habits associated with smoking. If you smoked while driving or when feeling stressed, try other activities to replace smoking. Stand up when drinking your coffee. Brush your teeth after eating. Sit in a different chair when you read the paper. Avoid alcohol while trying to quit, and try to drink fewer caffeinated beverages. Alcohol and caffeine may urge you to smoke.  Avoid foods and drinks that can trigger a desire to smoke, such as sugary or spicy foods and alcohol.  Ask people who smoke not to smoke around you.  Have something planned to do right after eating or having a cup of coffee. For example, plan to take a walk or exercise.  Try a relaxation exercise to calm you down and decrease your stress. Remember, you may be tense and nervous for the first 2 weeks after you quit, but this will pass.  Find new activities to keep your hands busy. Play with a pen, coin, or rubber band. Doodle or draw things on paper.  Brush your teeth right after eating. This will help cut down on the craving for the taste of tobacco after meals. You can also try mouthwash.   Use oral substitutes in place of cigarettes. Try using lemon drops, carrots, cinnamon sticks, or chewing gum. Keep them handy so they are available when you have the urge to smoke.  When you have the urge to smoke, try deep breathing.  Designate your home as a nonsmoking area.  If you are a heavy smoker, ask your health care Quintessa Simmerman about a prescription for  nicotine chewing gum. It can ease your withdrawal from nicotine.  Reward yourself. Set aside the cigarette money you save and buy yourself something nice.  Look for support from others. Join a support group or smoking cessation program. Ask someone at home or at work   to help you with your plan to quit smoking.  Always ask yourself, "Do I need this cigarette or is this just a reflex?" Tell yourself, "Today, I choose not to smoke," or "I do not want to smoke." You are reminding yourself of your decision to quit.  Do not replace cigarette smoking with electronic cigarettes (commonly called e-cigarettes). The safety of e-cigarettes is unknown, and some may contain harmful chemicals.  If you relapse, do not give up! Plan ahead and think about what you will do the next time you get the urge to smoke. HOW WILL I FEEL WHEN I QUIT SMOKING? You may have symptoms of withdrawal because your body is used to nicotine (the addictive substance in cigarettes). You may crave cigarettes, be irritable, feel very hungry, cough often, get headaches, or have difficulty concentrating. The withdrawal symptoms are only temporary. They are strongest when you first quit but will go away within 10-14 days. When withdrawal symptoms occur, stay in control. Think about your reasons for quitting. Remind yourself that these are signs that your body is healing and getting used to being without cigarettes. Remember that withdrawal symptoms are easier to treat than the major diseases that smoking can cause.  Even after the withdrawal is over, expect periodic urges to smoke. However, these cravings are generally short lived and will go away whether you smoke or not. Do not smoke! WHAT RESOURCES ARE AVAILABLE TO HELP ME QUIT SMOKING? Your health care Fumiye Lubben can direct you to community resources or hospitals for support, which may include:  Group support.  Education.  Hypnosis.  Therapy. Document Released: 08/21/2004 Document  Revised: 04/09/2014 Document Reviewed: 05/11/2013 [facility] Patient Information 2015 [place], [place]. This information is not intended to replace advice given to you by your health care Josclyn Rosales. Make sure you discuss any questions you have with your health care Kilie Rund.      Managing Your High Blood Pressure Blood pressure is a measurement of how forceful your blood is pressing against the walls of the arteries. Arteries are muscular tubes within the circulatory system. Blood pressure does not stay the same. Blood pressure rises when you are active, excited, or nervous; and it lowers during sleep and relaxation. If the numbers measuring your blood pressure stay above normal most of the time, you are at risk for health problems. High blood pressure (hypertension) is a long-term (chronic) condition in which blood pressure is elevated. A blood pressure reading is recorded as two numbers, such as 120 over 80 (or 120/80). The first, higher number is called the systolic pressure. It is a measure of the pressure in your arteries as the heart beats. The second, lower number is called the diastolic pressure. It is a measure of the pressure in your arteries as the heart relaxes between beats.  Keeping your blood pressure in a normal range is important to your overall health and prevention of health problems, such as heart disease and stroke. When your blood pressure is uncontrolled, your heart has to work harder than normal. High blood pressure is a very common condition in adults because blood pressure tends to rise with age. Men and women are equally likely to have hypertension but at different times in life. Before age 75, men are more likely to have hypertension. After 77 years of age, women are more likely to have it. Hypertension is especially common in African Americans. This condition often has no signs or symptoms. The cause of the condition is usually not known. Your caregiver  can help you come up with a  plan to keep your blood pressure in a normal, healthy range. BLOOD PRESSURE STAGES Blood pressure is classified into four stages: normal, prehypertension, stage 1, and stage 2. Your blood pressure reading will be used to determine what type of treatment, if any, is necessary. Appropriate treatment options are tied to these four stages:  Normal  Systolic pressure (mm Hg): below 120.  Diastolic pressure (mm Hg): below 80. Prehypertension  Systolic pressure (mm Hg): 120 to 139.  Diastolic pressure (mm Hg): 80 to 89. Stage1  Systolic pressure (mm Hg): 140 to 159.  Diastolic pressure (mm Hg): 90 to 99. Stage2  Systolic pressure (mm Hg): 160 or above.  Diastolic pressure (mm Hg): 100 or above. RISKS RELATED TO HIGH BLOOD PRESSURE Managing your blood pressure is an important responsibility. Uncontrolled high blood pressure can lead to:  A heart attack.  A stroke.  A weakened blood vessel (aneurysm).  Heart failure.  Kidney damage.  Eye damage.  Metabolic syndrome.  Memory and concentration problems. HOW TO MANAGE YOUR BLOOD PRESSURE Blood pressure can be managed effectively with lifestyle changes and medicines (if needed). Your caregiver will help you come up with a plan to bring your blood pressure within a normal range. Your plan should include the following: Education  Read all information provided by your caregivers about how to control blood pressure.  Educate yourself on the latest guidelines and treatment recommendations. New research is always being done to further define the risks and treatments for high blood pressure. Lifestylechanges  Control your weight.  Avoid smoking.  Stay physically active.  Reduce the amount of salt in your diet.  Reduce stress.  Control any chronic conditions, such as high cholesterol or diabetes.  Reduce your alcohol intake. Medicines  Several medicines (antihypertensive medicines) are available, if needed, to bring  blood pressure within a normal range. Communication  Review all the medicines you take with your caregiver because there may be side effects or interactions.  Talk with your caregiver about your diet, exercise habits, and other lifestyle factors that may be contributing to high blood pressure.  See your caregiver regularly. Your caregiver can help you create and adjust your plan for managing high blood pressure. RECOMMENDATIONS FOR TREATMENT AND FOLLOW-UP  The following recommendations are based on current guidelines for managing high blood pressure in nonpregnant adults. Use these recommendations to identify the proper follow-up period or treatment option based on your blood pressure reading. You can discuss these options with your caregiver.  Systolic pressure of 938 to 101 or diastolic pressure of 80 to 89: Follow up with your caregiver as directed.  Systolic pressure of 751 to 025 or diastolic pressure of 90 to 100: Follow up with your caregiver within 2 months.  Systolic pressure above 852 or diastolic pressure above 778: Follow up with your caregiver within 1 month.  Systolic pressure above 242 or diastolic pressure above 353: Consider antihypertensive therapy; follow up with your caregiver within 1 week.  Systolic pressure above 614 or diastolic pressure above 431: Begin antihypertensive therapy; follow up with your caregiver within 1 week. Document Released: 08/17/2012 Document Reviewed: 08/17/2012 [facility] Patient Information 2015 [place]. This information is not intended to replace advice given to you by your health care Vidya Bamford. Make sure you discuss any questions you have with your health care Triston Lisanti.

## 2015-08-30 ENCOUNTER — Encounter (HOSPITAL_COMMUNITY): Payer: Self-pay | Admitting: Emergency Medicine

## 2015-08-30 ENCOUNTER — Emergency Department (HOSPITAL_COMMUNITY)
Admission: EM | Admit: 2015-08-30 | Discharge: 2015-08-30 | Disposition: A | Discharge status: Home / Self Care | Payer: Medicare Other | Attending: Emergency Medicine | Admitting: Emergency Medicine

## 2015-08-30 DIAGNOSIS — M1990 Unspecified osteoarthritis, unspecified site: Secondary | ICD-10-CM | POA: Insufficient documentation

## 2015-08-30 DIAGNOSIS — Z7982 Long term (current) use of aspirin: Secondary | ICD-10-CM | POA: Diagnosis not present

## 2015-08-30 DIAGNOSIS — K219 Gastro-esophageal reflux disease without esophagitis: Secondary | ICD-10-CM | POA: Diagnosis not present

## 2015-08-30 DIAGNOSIS — Z86018 Personal history of other benign neoplasm: Secondary | ICD-10-CM | POA: Diagnosis not present

## 2015-08-30 DIAGNOSIS — Z853 Personal history of malignant neoplasm of breast: Secondary | ICD-10-CM | POA: Insufficient documentation

## 2015-08-30 DIAGNOSIS — R040 Epistaxis: Secondary | ICD-10-CM | POA: Diagnosis present

## 2015-08-30 DIAGNOSIS — Z720 Tobacco use: Secondary | ICD-10-CM | POA: Diagnosis not present

## 2015-08-30 DIAGNOSIS — I10 Essential (primary) hypertension: Secondary | ICD-10-CM | POA: Diagnosis not present

## 2015-08-30 DIAGNOSIS — Z79899 Other long term (current) drug therapy: Secondary | ICD-10-CM | POA: Insufficient documentation

## 2015-08-30 MED ORDER — PHENYLEPHRINE HCL 0.5 % NA SOLN
1.0000 [drp] | Freq: Once | NASAL | Status: AC
  Administered 2015-08-30: 1 [drp] via NASAL
  Filled 2015-08-30: qty 15

## 2015-08-30 MED ORDER — SALINE SPRAY 0.65 % NA SOLN
1.0000 | Freq: Once | NASAL | Status: AC
  Administered 2015-08-30: 1 via NASAL
  Filled 2015-08-30: qty 44

## 2015-08-30 NOTE — ED Notes (Signed)
Pt states nose started bleeding around 2200 tonight, stopped for a little then started back. Pt denies any pain. In triage pt BP was high, tech checking itmanually.

## 2015-08-30 NOTE — ED Provider Notes (Signed)
CSN: [id number]   Arrival date & time 08/30/15 0022  History  This chart was scribed for Carmin Muskrat, MD by Altamease Oiler, ED Scribe. This patient was seen in room WA17/WA17 and the patient's care was started at 1:18 AM.  Chief Complaint  Patient presents with  . Epistaxis    HPI The history is provided by the patient. No language interpreter was used.   Linda [name] is a 79 y.o. female with PMHx of HTN who presents to the Emergency Department complaining of new right-sided epistaxis with onset 3 hours ago. The bleeding has been steady but does not run down the back of the throat. A cool compress has not stopped the bleeding. Pt denies fever, light-headedness, chest pain, and confusion. No anticoagulation apart from aspirin.    Past Medical History  Diagnosis Date  . Constipation   . Hypertension   . Arthritis   . Nail fungus   . Diverticulosis of colon (without mention of hemorrhage)   . Breast cancer   . Uterine fibroid   . GERD (gastroesophageal reflux disease)     Past Surgical History  Procedure Laterality Date  . Right lumpectomy with right sentinel lymph node dissection  2004    Dr. Bubba Camp  . Cholecystectomy  ~1980  . Vaginal hysterectomy    . Colonoscopy      Hx; of  . Abdominal hysterectomy    . Endarterectomy Right 01/19/2014    Procedure: ENDARTERECTOMY CAROTID;  Surgeon: Serafina Mitchell, MD;  Location: [facility] OR;  Service: Vascular;  Laterality: Right;    Family History  Problem Relation Age of Onset  . Colon cancer Mother   . Colon cancer Maternal Grandmother     Social History  Substance Use Topics  . Smoking status: Current Every Day Smoker -- 1.00 packs/day    Types: Cigarettes  . Smokeless tobacco: Never Used     Comment: form given 12/14/11  . Alcohol Use: No     Review of Systems  Constitutional:       Per HPI, otherwise negative  HENT:       Per HPI, otherwise negative  Respiratory:       Per HPI, otherwise negative  Cardiovascular:    Per HPI, otherwise negative  Gastrointestinal: Negative for vomiting.  Endocrine:       Negative aside from HPI  Genitourinary:       Neg aside from HPI   Musculoskeletal:       Per HPI, otherwise negative  Skin: Negative.   Neurological: Negative for syncope.   Home Medications   Prior to Admission medications   Medication Sig Start Date End Date Taking? Authorizing Provider  aspirin EC 81 MG tablet Take 1 tablet (81 mg total) by mouth daily. 02/24/12  Yes [name], MD  cloNIDine (CATAPRES) 0.1 MG tablet Take 0.1 mg by mouth 2 (two) times daily. 08/08/15  Yes Historical Provider, MD  hydrALAZINE (APRESOLINE) 25 MG tablet Take 1 tablet (25 mg total) by mouth every 8 (eight) hours. 09/30/14  Yes [name], MD  pantoprazole (PROTONIX) 40 MG tablet Take 40 mg by mouth daily.   Yes Historical Provider, MD  trandolapril (MAVIK) 4 MG tablet Take 4 mg by mouth daily.    Yes Historical Provider, MD  verapamil (CALAN-SR) 240 MG CR tablet Take 240 mg by mouth 2 (two) times daily.    Yes Historical Provider, MD  ARTIFICIAL TEARS 0.1-0.3 % SOLN Apply 1 drop to eye 3 (three)  times daily. Patient not taking: Reported on 08/30/2015 06/22/15   [name], PA-C  cloNIDine (CATAPRES) 0.2 MG tablet Take 1 tablet by mouth 2 (two) times daily. 08/29/15   Historical Provider, MD  gabapentin (NEURONTIN) 300 MG capsule Take 1 capsule by mouth 2 (two) times daily. 08/29/15   Historical Provider, MD    Allergies  Review of patient's allergies indicates no known allergies.  Triage Vitals: BP 190/120 mmHg  Pulse 113  Temp(Src) 98.2 F (36.8 C) (Oral)  Resp 22  SpO2 96%  Physical Exam  Constitutional: She is oriented to person, place, and time. She appears well-developed and well-nourished. No distress.  HENT:  Head: Normocephalic and atraumatic.    Eyes: Conjunctivae and EOM are normal.  Neck:  No tracheal deviation No neck asymmetry  Cardiovascular: Normal rate and regular rhythm.  Exam  reveals no gallop and no friction rub.   No murmur heard. Pulmonary/Chest: Effort normal and breath sounds normal. No stridor. No respiratory distress. She has no wheezes. She has no rales.  Abdominal: She exhibits no distension.  Musculoskeletal: She exhibits no edema.  Neurological: She is alert and oriented to person, place, and time. No cranial nerve deficit.  Skin: Skin is warm and dry.  Psychiatric: She has a normal mood and affect.  Nursing note and vitals reviewed.   ED Course  EPISTAXIS MANAGEMENT Date/Time: 08/30/2015 2:51 AM Performed by: Carmin Muskrat Authorized by: Carmin Muskrat Consent: The procedure was performed in an emergent situation. Verbal consent obtained. Risks and benefits: risks, benefits and alternatives were discussed Consent given by: patient Patient understanding: patient states understanding of the procedure being performed Patient consent: the patient's understanding of the procedure matches consent given Procedure consent: procedure consent matches procedure scheduled Relevant documents: relevant documents present and verified Test results: test results available and properly labeled Site marked: the operative site was marked Imaging studies: imaging studies available Required items: required blood products, implants, devices, and special equipment available Patient identity confirmed: verbally with patient Time out: Immediately prior to procedure a "time out" was called to verify the correct patient, procedure, equipment, support staff and site/side marked as required. Preparation: Patient was prepped and draped in the usual sterile fashion. Anesthesia: see MAR for details Patient sedated: no Treatment site: right Kiesselbach's area Repair method: silver nitrate and cophenylcaine Post-procedure assessment: bleeding stopped Treatment complexity: simple Recurrence: recurrence of recent bleed Patient tolerance: Patient tolerated the procedure  well with no immediate complications     COORDINATION OF CARE: 1:20 AM Discussed treatment plan with pt at bedside and pt agreed to plan.     2:53 AM  Bleeding stopped  MDM   Final diagnoses:  Epistaxis   Elderly female presents with right nasal bleeding. Patient is awake and alert, minimally tachycardic, hypertensive, but in no distress. Patient has not taken her medication yet this morning. Patient had bleeding cessation with phenylephrine, silver nitrate Patient discharged with home care instructions.  I personally performed the services described in this documentation, which was scribed in my presence. The recorded information has been reviewed and is accurate.     Carmin Muskrat, MD 08/30/15 [phone]

## 2015-08-30 NOTE — Discharge Instructions (Signed)
As discussed, with your nosebleed it is important that you monitor your condition carefully, and do not hesitate to return here if you develop new, or concerning changes. Otherwise, please follow-up with your physician, and the ENT specialist 1 Nosebleed A nosebleed can be caused by many things, including:  Getting hit hard in the nose.  Infections.  Dry nose.  Colds.  Medicines. Your doctor may do lab testing if you get nosebleeds a lot and the cause is not known. HOME CARE   If your nose was packed with material, keep it there until your doctor takes it out. Put the pack back in your nose if the pack falls out.  Do not blow your nose for 12 hours after the nosebleed.  Sit up and bend forward if your nose starts bleeding again. Pinch the front half of your nose nonstop for 20 minutes.  Put petroleum jelly inside your nose every morning if you have a dry nose.  Use a humidifier to make the air less dry.  Do not take aspirin.  Try not to strain, lift, or bend at the waist for many days after the nosebleed. GET HELP RIGHT AWAY IF:   Nosebleeds keep happening and are hard to stop or control.  You have bleeding or bruises that are not normal on other parts of the body.  You have a fever.  The nosebleeds get worse.  You get lightheaded, feel faint, sweaty, or throw up (vomit) blood. MAKE SURE YOU:   Understand these instructions.  Will watch your condition.  Will get help right away if you are not doing well or get worse. Document Released: 09/01/2008 Document Revised: 02/15/2012 Document Reviewed: 09/01/2008 [facility] Patient Information 2015 [place], [place]. This information is not intended to replace advice given to you by your health care provider. Make sure you discuss any questions you have with your health care provider.

## 2015-10-03 ENCOUNTER — Encounter: Payer: Self-pay | Admitting: Family

## 2015-10-03 ENCOUNTER — Other Ambulatory Visit: Payer: Self-pay | Admitting: *Deleted

## 2015-10-03 DIAGNOSIS — I10 Essential (primary) hypertension: Secondary | ICD-10-CM

## 2015-10-08 ENCOUNTER — Ambulatory Visit (INDEPENDENT_AMBULATORY_CARE_PROVIDER_SITE_OTHER): Payer: Medicare Other | Admitting: Family

## 2015-10-08 ENCOUNTER — Ambulatory Visit: Payer: Self-pay | Admitting: Family

## 2015-10-08 ENCOUNTER — Encounter: Payer: Self-pay | Admitting: Family

## 2015-10-08 ENCOUNTER — Ambulatory Visit (HOSPITAL_COMMUNITY)
Admission: RE | Admit: 2015-10-08 | Discharge: 2015-10-08 | Disposition: A | Discharge status: Home / Self Care | Payer: Medicare Other | Source: Ambulatory Visit | Attending: Family | Admitting: Family

## 2015-10-08 VITALS — BP 221/92 | HR 87 | Temp 97.1°F | Resp 18 | Ht 64.0 in | Wt 166.0 lb

## 2015-10-08 DIAGNOSIS — Z720 Tobacco use: Secondary | ICD-10-CM

## 2015-10-08 DIAGNOSIS — Z9889 Other specified postprocedural states: Secondary | ICD-10-CM | POA: Diagnosis not present

## 2015-10-08 DIAGNOSIS — Z48812 Encounter for surgical aftercare following surgery on the circulatory system: Secondary | ICD-10-CM

## 2015-10-08 DIAGNOSIS — I10 Essential (primary) hypertension: Secondary | ICD-10-CM | POA: Diagnosis not present

## 2015-10-08 DIAGNOSIS — F17200 Nicotine dependence, unspecified, uncomplicated: Secondary | ICD-10-CM

## 2015-10-08 NOTE — Progress Notes (Signed)
Filed Vitals:   10/08/15 0910 10/08/15 0913 10/08/15 0921 10/08/15 0923  BP: 217/97 212/95 195/86 221/92  Pulse: 90 86 84 87  Temp:  97.1 F (36.2 C)    TempSrc:  Oral    Resp:  18    Height:  5\' 4"  (1.626 m)    Weight:  166 lb (75.297 kg)    SpO2:  100%

## 2015-10-08 NOTE — Progress Notes (Signed)
Established Carotid Patient   History of Present Illness  Linda [name] is a 79 y.o. female who is s/p right carotid endarterectomy on 01/19/2014. She has no history of stroke or TIA. Dr. Trula Slade had to resect approximately 2 cm of the internal carotid artery which was primarily repaired. This was secondary to a kink. Intraoperative findings included 90% stenosis. The patient's postoperative course was uncomplicated.  The patient denies any history of TIA or stroke symptoms, specifically the patient denies a history of amaurosis fugax or monocular blindness, denies a history unilateral of facial drooping, denies a history of hemiplegia, and denies a history of receptive or expressive aphasia.   She returns today for duplex evaluation of renal arteries; if she has renal artery stenosis this may be the etiology of her uncontrolled hypertension Pt is taking four antihypertensive medications, states her blood pressure has been difficult to control.   Pt denies any cardiac problems. Pt denies claudication symptoms with walking.  She has a goiter, denies dysphagia, denies palpitations, denies feeling excessively hot, denies unintentional weight loss.  The patient reports New Medical or Surgical History: shingles on the left side of her forehead since August 2016, is being addressed by her PCP per pt. Is taking antibiotics for a UTI.  She is taking 4 medications for hypertension, states this has been difficult to control, is managed by her PCP. Pt denies any history of ultrasound being performed to check her kidney arteries, no renal artery ultrasound results on file.  Pt Diabetic: no Pt smoker: smoker (1.5 ppd, started about age 74 yrs). She has a prescription for Chantix, but has not started yet until her friends get their medication which pt states will likely be December 2016  Pt meds include: Statin : yes, states she started it in October, this medication is not on file, pt does not  recall the name of the medication ASA: yes Other anticoagulants/antiplatelets: no     Past Medical History  Diagnosis Date  . Constipation   . Hypertension   . Arthritis   . Nail fungus   . Diverticulosis of colon (without mention of hemorrhage)   . Breast cancer   . Uterine fibroid   . GERD (gastroesophageal reflux disease)     Social History Social History  Substance Use Topics  . Smoking status: Current Every Day Smoker -- 1.00 packs/day    Types: Cigarettes  . Smokeless tobacco: Never Used     Comment: form given 12/14/11  . Alcohol Use: No    Family History Family History  Problem Relation Age of Onset  . Colon cancer Mother   . Colon cancer Maternal Grandmother     Surgical History Past Surgical History  Procedure Laterality Date  . Right lumpectomy with right sentinel lymph node dissection  2004    Dr. Bubba Camp  . Cholecystectomy  ~1980  . Vaginal hysterectomy    . Colonoscopy      Hx; of  . Abdominal hysterectomy    . Endarterectomy Right 01/19/2014    Procedure: ENDARTERECTOMY CAROTID;  Surgeon: Serafina Mitchell, MD;  Location: [facility] OR;  Service: Vascular;  Laterality: Right;    No Known Allergies  Current Outpatient Prescriptions  Medication Sig Dispense Refill  . ARTIFICIAL TEARS 0.1-0.3 % SOLN Apply 1 drop to eye 3 (three) times daily. (Patient not taking: Reported on 08/30/2015) 30 mL 0  . aspirin EC 81 MG tablet Take 1 tablet (81 mg total) by mouth daily. 1 tablet 0  .  cloNIDine (CATAPRES) 0.1 MG tablet Take 0.1 mg by mouth 2 (two) times daily.  0  . cloNIDine (CATAPRES) 0.2 MG tablet Take 1 tablet by mouth 2 (two) times daily.    Marland Kitchen gabapentin (NEURONTIN) 300 MG capsule Take 1 capsule by mouth 2 (two) times daily.    . hydrALAZINE (APRESOLINE) 25 MG tablet Take 1 tablet (25 mg total) by mouth every 8 (eight) hours. 90 tablet 3  . pantoprazole (PROTONIX) 40 MG tablet Take 40 mg by mouth daily.    . trandolapril (MAVIK) 4 MG tablet Take 4 mg by mouth  daily.     . verapamil (CALAN-SR) 240 MG CR tablet Take 240 mg by mouth 2 (two) times daily.     . [DISCONTINUED] omeprazole (PRILOSEC) 20 MG capsule Take 1 capsule (20 mg total) by mouth daily. 30 capsule 0   No current facility-administered medications for this visit.    Review of Systems : See HPI for pertinent positives and negatives.  Physical Examination  Filed Vitals:   10/08/15 0910 10/08/15 0913 10/08/15 0921 10/08/15 0923  BP: 217/97 212/95 195/86 221/92  Pulse: 90 86 84 87  Temp:  97.1 F (36.2 C)    TempSrc:  Oral    Resp:  18    Height:  5\' 4"  (1.626 m)    Weight:  166 lb (75.297 kg)    SpO2:  100%     Body mass index is 28.48 kg/(m^2).  General: WDWN female in NAD Neck: goiter GAIT: antalgic, using cane Eyes: PERRLA Pulmonary: Non-labored, CTAB, norales, no rhonchi, & no wheezing.  Cardiac: regular rhythm, no detected murmur.  VASCULAR EXAM Carotid Bruits Right Left   Negative Negative   Aorta is not palpable. Radial pulses are 2+ palpable and equal.      LE Pulses Right Left   POPLITEAL not palpable  not palpable   POSTERIOR TIBIAL not palpable  not palpable    DORSALIS PEDIS  ANTERIOR TIBIAL not palpable  not palpable     Gastrointestinal: soft, nontender, BS WNL, no r/g, no palpable masses.  Musculoskeletal: No muscle atrophy/wasting. M/S 5/5 throughout, extremities without ischemic changes.  Neurologic: A&O X 3; Appropriate Affect, Speech is normal CN 2-12 intact, pain and light touch intact in extremities, Motor exam as listed above.        Non-Invasive Vascular Imaging Renal artery DUPLEX 10/08/2015   RENAL ARTERY DUPLEX EVALUATION    INDICATION: Uncontrolled hypertension    PREVIOUS INTERVENTION(S): NA    DUPLEX EXAM:     AORTA Peak Systolic Velocity  (cm/s): 69    RIGHT  LEFT   Peak Systolic Velocities (cm/s) Comments  Peak Systolic Velocities (cm/s) Comments  99  Renal Artery Origin/Proximal NA   109  Renal Artery Mid 36/72   90  Renal Artery Distal 100     Accessory Renal Artery (when present)    1.58  Renal / Aortic Ratio (RAR) 1.45  10.8 Kidney Size (cm) 10.4  spontaneous and phasic  Renal Vein spontaneous and phasic    ADDITIONAL FINDINGS: Anechoic area involving the left kidney measuring 4.58cm x 4.90cm, not vascular in nature.    IMPRESSION: Patent bilateral renal arteries, no hemodynamically significant stenosis identified. Unable to fully evaluate the left renal artery due to bowel gas and prominent left renal vein, proximal/mid segment is not visualized. Patent bilateral renal veins.    Compared to the previous exam:  No previous study to compare.      Assessment: Linda Crocker  [name] is a 79 y.o. female who is s/p right carotid endarterectomy on 01/19/2014. She has no history of stroke or TIA.  She returns today for duplex evaluation of renal arteries; if she has renal artery stenosis this may be the etiology of her uncontrolled hypertension Pt is taking four antihypertensive medications, states her blood pressure has been difficult to control.  Today's renal artery duplex suggests patent bilateral renal arteries, no hemodynamically significant stenosis identified. Unable to fully evaluate the left renal artery due to bowel gas and prominent left renal vein, proximal/mid segment is not visualized. This is her first renal artery duplex on file, no previous for comparison. An incidental finding is an anechoic area involving the left kidney measuring 4.58cm x 4.90cm, not vascular in nature.  Unfortunately she continues to smoke.  Face to face time with patient was 25 minutes. Over 50% of this time was spent on counseling and coordination of care.    Plan:  The patient was counseled re smoking cessation and given several  free resources re smoking cessation.   Follow-up in September 2017 with carotid duplex as already scheduled.   I discussed in depth with the patient the nature of atherosclerosis, and emphasized the importance of maximal medical management including strict control of blood pressure, blood glucose, and lipid levels, obtaining regular exercise, and cessation of smoking.  The patient is aware that without maximal medical management the underlying atherosclerotic disease process will progress, limiting the benefit of any interventions. The patient was given information about stroke prevention and what symptoms should prompt the patient to seek immediate medical care. Thank you for allowing [place] to participate in this patient's care.  Clemon Chambers, RN, MSN, FNP-C [facility] Office: [phone]  Clinic Physician: Early  10/08/2015 8:58 AM

## 2015-10-08 NOTE — Patient Instructions (Signed)
Stroke Prevention Some medical conditions and behaviors are associated with an increased chance of having a stroke. You may prevent a stroke by making healthy choices and managing medical conditions. HOW CAN I REDUCE MY RISK OF HAVING A STROKE?   Stay physically active. Get at least 30 minutes of activity on most or all days.  Do not smoke. It may also be helpful to avoid exposure to secondhand smoke.  Limit alcohol use. Moderate alcohol use is considered to be:  No more than 2 drinks per day for men.  No more than 1 drink per day for nonpregnant women.  Eat healthy foods. This involves:  Eating 5 or more servings of fruits and vegetables a day.  Making dietary changes that address high blood pressure (hypertension), high cholesterol, diabetes, or obesity.  Manage your cholesterol levels.  Making food choices that are high in fiber and low in saturated fat, trans fat, and cholesterol may control cholesterol levels.  Take any prescribed medicines to control cholesterol as directed by your health care provider.  Manage your diabetes.  Controlling your carbohydrate and sugar intake is recommended to manage diabetes.  Take any prescribed medicines to control diabetes as directed by your health care provider.  Control your hypertension.  Making food choices that are low in salt (sodium), saturated fat, trans fat, and cholesterol is recommended to manage hypertension.  Ask your health care provider if you need treatment to lower your blood pressure. Take any prescribed medicines to control hypertension as directed by your health care provider.  If you are 18-39 years of age, have your blood pressure checked every 3-5 years. If you are 40 years of age or older, have your blood pressure checked every year.  Maintain a healthy weight.  Reducing calorie intake and making food choices that are low in sodium, saturated fat, trans fat, and cholesterol are recommended to manage  weight.  Stop drug abuse.  Avoid taking birth control pills.  Talk to your health care provider about the risks of taking birth control pills if you are over 35 years old, smoke, get migraines, or have ever had a blood clot.  Get evaluated for sleep disorders (sleep apnea).  Talk to your health care provider about getting a sleep evaluation if you snore a lot or have excessive sleepiness.  Take medicines only as directed by your health care provider.  For some people, aspirin or blood thinners (anticoagulants) are helpful in reducing the risk of forming abnormal blood clots that can lead to stroke. If you have the irregular heart rhythm of atrial fibrillation, you should be on a blood thinner unless there is a good reason you cannot take them.  Understand all your medicine instructions.  Make sure that other conditions (such as anemia or atherosclerosis) are addressed. SEEK IMMEDIATE MEDICAL CARE IF:   You have sudden weakness or numbness of the face, arm, or leg, especially on one side of the body.  Your face or eyelid droops to one side.  You have sudden confusion.  You have trouble speaking (aphasia) or understanding.  You have sudden trouble seeing in one or both eyes.  You have sudden trouble walking.  You have dizziness.  You have a loss of balance or coordination.  You have a sudden, severe headache with no known cause.  You have new chest pain or an irregular heartbeat. Any of these symptoms may represent a serious problem that is an emergency. Do not wait to see if the symptoms will   go away. Get medical help at once. Call your local emergency services (911 in [place] Do not drive yourself to the hospital.   This information is not intended to replace advice given to you by your health care provider. Make sure you discuss any questions you have with your health care provider.   Document Released: 12/31/2004 Document Revised: 12/14/2014 Document Reviewed:  05/26/2013 Elsevier Interactive Patient Education 2016 [employer]    Smoking Cessation, Tips for Success If you are ready to quit smoking, congratulations! You have chosen to help yourself be healthier. Cigarettes bring nicotine, tar, carbon monoxide, and other irritants into your body. Your lungs, heart, and blood vessels will be able to work better without these poisons. There are many different ways to quit smoking. Nicotine gum, nicotine patches, a nicotine inhaler, or nicotine nasal spray can help with physical craving. Hypnosis, support groups, and medicines help break the habit of smoking. WHAT THINGS CAN I DO TO MAKE QUITTING EASIER?  Here are some tips to help you quit for good:  Pick a date when you will quit smoking completely. Tell all of your friends and family about your plan to quit on that date.  Do not try to slowly cut down on the number of cigarettes you are smoking. Pick a quit date and quit smoking completely starting on that day.  Throw away all cigarettes.   Clean and remove all ashtrays from your home, work, and car.  On a card, write down your reasons for quitting. Carry the card with you and read it when you get the urge to smoke.  Cleanse your body of nicotine. Drink enough water and fluids to keep your urine clear or pale yellow. Do this after quitting to flush the nicotine from your body.  Learn to predict your moods. Do not let a bad situation be your excuse to have a cigarette. Some situations in your life might tempt you into wanting a cigarette.  Never have "just one" cigarette. It leads to wanting another and another. Remind yourself of your decision to quit.  Change habits associated with smoking. If you smoked while driving or when feeling stressed, try other activities to replace smoking. Stand up when drinking your coffee. Brush your teeth after eating. Sit in a different chair when you read the paper. Avoid alcohol while trying to quit, and try to  drink fewer caffeinated beverages. Alcohol and caffeine may urge you to smoke.  Avoid foods and drinks that can trigger a desire to smoke, such as sugary or spicy foods and alcohol.  Ask people who smoke not to smoke around you.  Have something planned to do right after eating or having a cup of coffee. For example, plan to take a walk or exercise.  Try a relaxation exercise to calm you down and decrease your stress. Remember, you may be tense and nervous for the first 2 weeks after you quit, but this will pass.  Find new activities to keep your hands busy. Play with a pen, coin, or rubber band. Doodle or draw things on paper.  Brush your teeth right after eating. This will help cut down on the craving for the taste of tobacco after meals. You can also try mouthwash.   Use oral substitutes in place of cigarettes. Try using lemon drops, carrots, cinnamon sticks, or chewing gum. Keep them handy so they are available when you have the urge to smoke.  When you have the urge to smoke, try deep breathing.    Designate your home as a nonsmoking area.  If you are a heavy smoker, ask your health care provider about a prescription for nicotine chewing gum. It can ease your withdrawal from nicotine.  Reward yourself. Set aside the cigarette money you save and buy yourself something nice.  Look for support from others. Join a support group or smoking cessation program. Ask someone at home or at work to help you with your plan to quit smoking.  Always ask yourself, "Do I need this cigarette or is this just a reflex?" Tell yourself, "Today, I choose not to smoke," or "I do not want to smoke." You are reminding yourself of your decision to quit.  Do not replace cigarette smoking with electronic cigarettes (commonly called e-cigarettes). The safety of e-cigarettes is unknown, and some may contain harmful chemicals.  If you relapse, do not give up! Plan ahead and think about what you will do the next  time you get the urge to smoke. HOW WILL I FEEL WHEN I QUIT SMOKING? You may have symptoms of withdrawal because your body is used to nicotine (the addictive substance in cigarettes). You may crave cigarettes, be irritable, feel very hungry, cough often, get headaches, or have difficulty concentrating. The withdrawal symptoms are only temporary. They are strongest when you first quit but will go away within 10-14 days. When withdrawal symptoms occur, stay in control. Think about your reasons for quitting. Remind yourself that these are signs that your body is healing and getting used to being without cigarettes. Remember that withdrawal symptoms are easier to treat than the major diseases that smoking can cause.  Even after the withdrawal is over, expect periodic urges to smoke. However, these cravings are generally short lived and will go away whether you smoke or not. Do not smoke! WHAT RESOURCES ARE AVAILABLE TO HELP ME QUIT SMOKING? Your health care provider can direct you to community resources or hospitals for support, which may include:  Group support.  Education.  Hypnosis.  Therapy.   This information is not intended to replace advice given to you by your health care provider. Make sure you discuss any questions you have with your health care provider.   Document Released: 08/21/2004 Document Revised: 12/14/2014 Document Reviewed: 05/11/2013 Elsevier Interactive Patient Education 2016 [employer]   Steps to Quit Smoking  Smoking tobacco can be harmful to your health and can affect almost every organ in your body. Smoking puts you, and those around you, at risk for developing many serious chronic diseases. Quitting smoking is difficult, but it is one of the best things that you can do for your health. It is never too late to quit. WHAT ARE THE BENEFITS OF QUITTING SMOKING? When you quit smoking, you lower your risk of developing serious diseases and conditions, such as:  Lung  cancer or lung disease, such as COPD.  Heart disease.  Stroke.  Heart attack.  Infertility.  Osteoporosis and bone fractures. Additionally, symptoms such as coughing, wheezing, and shortness of breath may get better when you quit. You may also find that you get sick less often because your body is stronger at fighting off colds and infections. If you are pregnant, quitting smoking can help to reduce your chances of having a baby of low birth weight. HOW DO I GET READY TO QUIT? When you decide to quit smoking, create a plan to make sure that you are successful. Before you quit:  Pick a date to quit. Set a date within the   next two weeks to give you time to prepare.  Write down the reasons why you are quitting. Keep this list in places where you will see it often, such as on your bathroom mirror or in your car or wallet.  Identify the people, places, things, and activities that make you want to smoke (triggers) and avoid them. Make sure to take these actions:  Throw away all cigarettes at home, at work, and in your car.  Throw away smoking accessories, such as ashtrays and lighters.  Clean your car and make sure to empty the ashtray.  Clean your home, including curtains and carpets.  Tell your family, friends, and coworkers that you are quitting. Support from your loved ones can make quitting easier.  Talk with your health care provider about your options for quitting smoking.  Find out what treatment options are covered by your health insurance. WHAT STRATEGIES CAN I USE TO QUIT SMOKING?  Talk with your healthcare provider about different strategies to quit smoking. Some strategies include:  Quitting smoking altogether instead of gradually lessening how much you smoke over a period of time. Research shows that quitting "cold turkey" is more successful than gradually quitting.  Attending in-person counseling to help you build problem-solving skills. You are more likely to have  success in quitting if you attend several counseling sessions. Even short sessions of 10 minutes can be effective.  Finding resources and support systems that can help you to quit smoking and remain smoke-free after you quit. These resources are most helpful when you use them often. They can include:  Online chats with a counselor.  Telephone quitlines.  Printed self-help materials.  Support groups or group counseling.  Text messaging programs.  Mobile phone applications.  Taking medicines to help you quit smoking. (If you are pregnant or breastfeeding, talk with your health care provider first.) Some medicines contain nicotine and some do not. Both types of medicines help with cravings, but the medicines that include nicotine help to relieve withdrawal symptoms. Your health care provider may recommend:  Nicotine patches, gum, or lozenges.  Nicotine inhalers or sprays.  Non-nicotine medicine that is taken by mouth. Talk with your health care provider about combining strategies, such as taking medicines while you are also receiving in-person counseling. Using these two strategies together makes you more likely to succeed in quitting than if you used either strategy on its own. If you are pregnant or breastfeeding, talk with your health care provider about finding counseling or other support strategies to quit smoking. Do not take medicine to help you quit smoking unless told to do so by your health care provider. WHAT THINGS CAN I DO TO MAKE IT EASIER TO QUIT? Quitting smoking might feel overwhelming at first, but there is a lot that you can do to make it easier. Take these important actions:  Reach out to your family and friends and ask that they support and encourage you during this time. Call telephone quitlines, reach out to support groups, or work with a counselor for support.  Ask people who smoke to avoid smoking around you.  Avoid places that trigger you to smoke, such as bars,  parties, or smoke-break areas at work.  Spend time around people who do not smoke.  Lessen stress in your life, because stress can be a smoking trigger for some people. To lessen stress, try:  Exercising regularly.  Deep-breathing exercises.  Yoga.  Meditating.  Performing a body scan. This involves closing your eyes, scanning   your body from head to toe, and noticing which parts of your body are particularly tense. Purposefully relax the muscles in those areas.  Download or purchase mobile phone or tablet apps (applications) that can help you stick to your quit plan by providing reminders, tips, and encouragement. There are many free apps, such as QuitGuide from the CDC (Centers for Disease Control and Prevention). You can find other support for quitting smoking (smoking cessation) through smokefree.gov and other websites. HOW WILL I FEEL WHEN I QUIT SMOKING? Within the first 24 hours of quitting smoking, you may start to feel some withdrawal symptoms. These symptoms are usually most noticeable 2-3 days after quitting, but they usually do not last beyond 2-3 weeks. Changes or symptoms that you might experience include:  Mood swings.  Restlessness, anxiety, or irritation.  Difficulty concentrating.  Dizziness.  Strong cravings for sugary foods in addition to nicotine.  Mild weight gain.  Constipation.  Nausea.  Coughing or a sore throat.  Changes in how your medicines work in your body.  A depressed mood.  Difficulty sleeping (insomnia). After the first 2-3 weeks of quitting, you may start to notice more positive results, such as:  Improved sense of smell and taste.  Decreased coughing and sore throat.  Slower heart rate.  Lower blood pressure.  Clearer skin.  The ability to breathe more easily.  Fewer sick days. Quitting smoking is very challenging for most people. Do not get discouraged if you are not successful the first time. Some people need to make many  attempts to quit before they achieve long-term success. Do your best to stick to your quit plan, and talk with your health care provider if you have any questions or concerns.   This information is not intended to replace advice given to you by your health care provider. Make sure you discuss any questions you have with your health care provider.   Document Released: 11/17/2001 Document Revised: 04/09/2015 Document Reviewed: 04/09/2015 Elsevier Interactive Patient Education 2016 [employer] Inc.   

## 2015-10-10 ENCOUNTER — Telehealth: Payer: Self-pay | Admitting: Family

## 2015-10-10 NOTE — Telephone Encounter (Signed)
Patient was not home- sent letter to home address with new appt time to allow for ultrasound. dpm

## 2015-10-10 NOTE — Telephone Encounter (Signed)
-----   Message from [place], NP sent at 10/09/2015  4:23 PM EDT ----- Regarding: FW:  further follow up of renal arteries Melinda, [facility] scheduling,  Please ask pt if she can return in 6 months to check her left kidney artery again with ultrasound. We will try to see if ultrasound can detect the missing areas we could not detect at her visit on 10/08/15. We are looking at her kidney arteries to see if some significant blockage could be causing her very high blood pressure. Please schedule pt to see me after her left renal artery duplex.  Thank you, Vinnie Level  ----- Message -----    From: Serafina Mitchell, MD    Sent: 10/09/2015   9:52 AM      To: Sharmon Leyden Nickel, NP Subject: RE: any further follow up of renal arteries?   Repeat duplex in 6 months to try and see missing area ----- Message -----    From: Viann Fish, NP    Sent: 10/08/2015   6:32 PM      To: Serafina Mitchell, MD, Sharmon Leyden Nickel, NP Subject: any further follow up of renal arteries?       Dr. Trula Slade,  We have been evaluating her carotid artery stenosis.  Pt came in for renal artery duplex. If she has renal artery stenosis this may be the etiology of her uncontrolled hypertension. Pt is taking four antihypertensive medications, states her blood pressure has been difficult to control; is about 200/100 today which is what it has been. Col[place]ta [place] was unable to fully evaluate the left renal artery due to bowel gas and prominent left renal vein, proximal/mid segment is not visualized. Should further evaluation of renal arteries be attempted, try to evaluate the left proximal/mid segment of the renal artery not visualized today, or is enough evaluated to determine that her severely uncontrolled htn is not due to renal artery stenosis?  Thank you, Merikay Lesniewski is a 79 y.o. female who is s/p right carotid endarterectomy on 01/19/2014. She has no history of stroke or TIA.  She returns today for duplex  evaluation of renal arteries; if she has renal artery stenosis this may be the etiology of her uncontrolled hypertension Pt is taking four antihypertensive medications, states her blood pressure has been difficult to control.  Today's renal artery duplex suggests patent bilateral renal arteries, no hemodynamically significant stenosis identified. Unable to fully evaluate the left renal artery due to bowel gas and prominent left renal vein, proximal/mid segment is not visualized. This is her first renal artery duplex on file, no previous for comparison. An incidental finding is an anechoic area involving the left kidney measuring 4.58cm x 4.90cm, not vascular in nature; will inform pt's PCP.  Unfortunately she continues to smoke.  Plan:  The patient was counseled re smoking cessation and given several free resources re smoking cessation.   Follow-up in September 2017 with carotid duplex as already scheduled.

## 2015-10-16 ENCOUNTER — Other Ambulatory Visit: Payer: Self-pay | Admitting: *Deleted

## 2015-10-16 DIAGNOSIS — I10 Essential (primary) hypertension: Secondary | ICD-10-CM

## 2016-09-01 ENCOUNTER — Ambulatory Visit: Payer: Self-pay | Admitting: Family

## 2016-09-01 ENCOUNTER — Ambulatory Visit (HOSPITAL_COMMUNITY): Payer: Medicare Other

## 2017-09-07 ENCOUNTER — Other Ambulatory Visit: Payer: Self-pay | Admitting: Specialist

## 2017-09-07 DIAGNOSIS — M810 Age-related osteoporosis without current pathological fracture: Secondary | ICD-10-CM

## 2017-09-07 DIAGNOSIS — M8580 Other specified disorders of bone density and structure, unspecified site: Secondary | ICD-10-CM

## 2017-09-07 DIAGNOSIS — Z1231 Encounter for screening mammogram for malignant neoplasm of breast: Secondary | ICD-10-CM

## 2017-09-28 ENCOUNTER — Ambulatory Visit
Admission: RE | Admit: 2017-09-28 | Discharge: 2017-09-28 | Disposition: A | Discharge status: Home / Self Care | Payer: Medicare Other | Source: Ambulatory Visit | Attending: Specialist | Admitting: Specialist

## 2017-09-28 DIAGNOSIS — M8580 Other specified disorders of bone density and structure, unspecified site: Secondary | ICD-10-CM

## 2017-09-28 DIAGNOSIS — Z1231 Encounter for screening mammogram for malignant neoplasm of breast: Secondary | ICD-10-CM

## 2017-09-28 DIAGNOSIS — M810 Age-related osteoporosis without current pathological fracture: Secondary | ICD-10-CM

## 2018-05-16 ENCOUNTER — Ambulatory Visit (INDEPENDENT_AMBULATORY_CARE_PROVIDER_SITE_OTHER): Payer: Medicare Other | Admitting: Family Medicine

## 2018-05-16 ENCOUNTER — Encounter: Payer: Self-pay | Admitting: Family Medicine

## 2018-05-16 VITALS — BP 138/70 | HR 93 | Ht 64.0 in | Wt 148.4 lb

## 2018-05-16 DIAGNOSIS — C50912 Malignant neoplasm of unspecified site of left female breast: Secondary | ICD-10-CM

## 2018-05-16 DIAGNOSIS — Z720 Tobacco use: Secondary | ICD-10-CM | POA: Diagnosis not present

## 2018-05-16 DIAGNOSIS — F17210 Nicotine dependence, cigarettes, uncomplicated: Secondary | ICD-10-CM | POA: Diagnosis not present

## 2018-05-16 DIAGNOSIS — I6529 Occlusion and stenosis of unspecified carotid artery: Secondary | ICD-10-CM | POA: Diagnosis not present

## 2018-05-16 DIAGNOSIS — K219 Gastro-esophageal reflux disease without esophagitis: Secondary | ICD-10-CM

## 2018-05-16 DIAGNOSIS — I10 Essential (primary) hypertension: Secondary | ICD-10-CM | POA: Diagnosis not present

## 2018-05-16 DIAGNOSIS — R011 Cardiac murmur, unspecified: Secondary | ICD-10-CM

## 2018-05-16 HISTORY — DX: Cardiac murmur, unspecified: R01.1

## 2018-05-16 MED ORDER — DEXLANSOPRAZOLE 60 MG PO CPDR: 1.0000 | DELAYED_RELEASE_CAPSULE | Freq: Every day | ORAL | 3 refills | Status: DC

## 2018-05-16 NOTE — Assessment & Plan Note (Addendum)
BP is well controlled at this time Recommend continuation of current medication.  Will request outside records to review recent labs.

## 2018-05-16 NOTE — Assessment & Plan Note (Signed)
Possible AS murmur Asymptomatic at this time She believes she has had ECHO previously, will await outside records

## 2018-05-16 NOTE — Assessment & Plan Note (Signed)
S/p endarterectomy in 2015, denies symptoms at this time Would likely benefit from statin given history Will obtain last labwork to see if she had a lipid panel completed

## 2018-05-16 NOTE — Assessment & Plan Note (Signed)
Doing well with dexilant, refilled.

## 2018-05-16 NOTE — Assessment & Plan Note (Signed)
Continue with yearly mammograms

## 2018-05-16 NOTE — Patient Instructions (Addendum)
It was very nice to meet you! I have refilled your dexilant Continue other medications I will see you back in about 6 months or sooner if needed.

## 2018-05-16 NOTE — Progress Notes (Addendum)
Linda [name] - 82 y.o. female MRN [id number]  Date of birth: 1936-09-23  Subjective Chief Complaint  Patient presents with  . Establish Care    HPI Linda [name] is a 82 y.o. female with a history of HTN, breast cancer, GERD, OA, and carotid stenosis s/p endarterectomy here today to establish care with new PCP.  She was previously followed by [facility] for primary care and last had labs in 01/2018.   -HTN:  HTN is currently managed with trandolapril, clonidine, hydralazine and verapamil.  She reports she is taking all medications as directed.  She denies side effects from medication including symptoms of hypotension.  She does not monitor her BP at home and unfortunately she continues to smoke ~1ppd.  She is not interested in quitting smoking at this time. She denies anginal symptoms, shortness of breath, palpitations, headache, edema or vision changes.   -GERD:  Current tx with dexilant which works well for her.  Denies increased reflux symptoms, abdominal pain, nausea, or melena.    -Breast cancer:  History of breast cancer s/p lumpectomy with chemo and radiation.  Continues to have yearly mammograms.  -Carotid stenosis:  S/p endarterectomy, last [place] in 2015.  Denies symptoms at this time including lightheadedness or vision changes.  She is not currently on a statin.  She had labs completed in February, unsure if lipids were checked.   ROS: ROS completed and negative except as noted per HPI No Known Allergies  Past Medical History:  Diagnosis Date  . Arthritis   . Breast cancer ([place])   . Constipation   . Diverticulosis of colon (without mention of hemorrhage)   . GERD (gastroesophageal reflux disease)   . Hypertension   . Nail fungus   . Uterine fibroid     Past Surgical History:  Procedure Laterality Date  . ABDOMINAL HYSTERECTOMY    . BREAST LUMPECTOMY Right 2004   radiation and chemo  . CHOLECYSTECTOMY  ~1980  . COLONOSCOPY     Hx; of  . ENDARTERECTOMY Right  01/19/2014   Procedure: ENDARTERECTOMY CAROTID;  Surgeon: Serafina Mitchell, MD;  Location: [facility] OR;  Service: Vascular;  Laterality: Right;  . Right lumpectomy with right sentinel lymph node dissection  2004   Dr. Bubba Camp  . VAGINAL HYSTERECTOMY      Social History   Socioeconomic History  . Marital status: Single    Spouse name: Not on file  . Number of children: 2  . Years of education: Not on file  . Highest education level: Not on file  Occupational History  . Occupation: Retired  Scientific laboratory technician  . Financial resource strain: Not on file  . Food insecurity:    Worry: Not on file    Inability: Not on file  . Transportation needs:    Medical: Not on file    Non-medical: Not on file  Tobacco Use  . Smoking status: Current Every Day Smoker    Packs/day: 1.00    Types: Cigarettes  . Smokeless tobacco: Never Used  . Tobacco comment: form given 12/14/11  Substance and Sexual Activity  . Alcohol use: No    Alcohol/week: 0.0 oz  . Drug use: No  . Sexual activity: Never  Lifestyle  . Physical activity:    Days per week: Not on file    Minutes per session: Not on file  . Stress: Not on file  Relationships  . Social connections:    Talks on phone: Not on file  Gets together: Not on file    Attends religious service: Not on file    Active member of club or organization: Not on file    Attends meetings of clubs or organizations: Not on file    Relationship status: Not on file  Other Topics Concern  . Not on file  Social History Narrative  . Not on file    Family History  Problem Relation Age of Onset  . Colon cancer Mother   . Cancer Mother        Kidney  . Pneumonia Father 71       Double  . Colon cancer Maternal Grandmother     Health Maintenance  Topic Date Due  . TETANUS/TDAP  01/14/1957  . PNA vac Low Risk Adult (2 of 2 - PCV13) 09/30/2015  . INFLUENZA VACCINE  07/07/2018  . DEXA SCAN  Completed     ----------------------------------------------------------------------------------------------------------------------------------------------------------------------------------------------------------------- Physical Exam BP 138/70   Pulse 93   Ht 5\' 4"  (1.626 m)   Wt 148 lb 6 oz (67.3 kg)   SpO2 99%   BMI 25.47 kg/m   Physical Exam  Constitutional: She is oriented to person, place, and time. She appears well-nourished. No distress.  HENT:  Head: Normocephalic and atraumatic.  Mouth/Throat: Oropharynx is clear and moist.  Eyes: No scleral icterus.  Neck: Neck supple. No thyromegaly present.  Cardiovascular: Normal rate and regular rhythm.  Murmur (3/6 systolic murmur) heard. Pulmonary/Chest: Effort normal and breath sounds normal. No respiratory distress.  Abdominal: Soft. Bowel sounds are normal. She exhibits no distension. There is no tenderness.  Musculoskeletal: She exhibits no edema.  Lymphadenopathy:    She has no cervical adenopathy.  Neurological: She is alert and oriented to person, place, and time.  Skin: Skin is warm and dry. No rash noted.  Psychiatric: She has a normal mood and affect. Her behavior is normal.    ------------------------------------------------------------------------------------------------------------------------------------------------------------------------------------------------------------------- Assessment and Plan  Essential hypertension BP is well controlled at this time Recommend continuation of current medication.  Will request outside records to review recent labs.   Carotid stenosis S/p endarterectomy in 2015, denies symptoms at this time Would likely benefit from statin given history Will obtain last labwork to see if she had a lipid panel completed   Malignant neoplasm of female breast ([place]) Continue with yearly mammograms  Tobacco abuse ~5 minutes spent counseling regarding nicotine use.  Recommended that she quit  and offered counseling/medications however she has no interest in quitting at this time  Cardiac murmur Possible AS murmur Asymptomatic at this time She believes she has had ECHO previously, will await outside records  GERD (gastroesophageal reflux disease) Doing well with dexilant, refilled.

## 2018-05-16 NOTE — Assessment & Plan Note (Addendum)
~  5 minutes spent counseling regarding nicotine use.  Recommended that she quit and offered counseling/medications however she has no interest in quitting at this time

## 2018-07-06 DIAGNOSIS — H2513 Age-related nuclear cataract, bilateral: Secondary | ICD-10-CM | POA: Diagnosis not present

## 2018-07-06 DIAGNOSIS — H40033 Anatomical narrow angle, bilateral: Secondary | ICD-10-CM | POA: Diagnosis not present

## 2018-07-12 ENCOUNTER — Other Ambulatory Visit: Payer: Self-pay

## 2018-07-12 NOTE — Patient Outreach (Signed)
[facility]) Care Management  3/0/1720  Linda [name] [birth year] [id number]   Medication Adherence call to Linda [name] spoke with patient she is showing past due on Atorvastatin 40 mg patient said she received a ninety days supply from [facility]. Linda [name] is showing past due under [place].  [place] Management Direct Dial [phone]  Fax [phone] [email or web address]

## 2018-07-13 ENCOUNTER — Other Ambulatory Visit: Payer: Self-pay | Admitting: Family Medicine

## 2018-08-11 ENCOUNTER — Other Ambulatory Visit: Payer: Self-pay | Admitting: Family Medicine

## 2018-10-27 ENCOUNTER — Other Ambulatory Visit: Payer: Self-pay | Admitting: Family Medicine

## 2018-11-02 ENCOUNTER — Other Ambulatory Visit: Payer: Self-pay | Admitting: Family Medicine

## 2018-11-15 ENCOUNTER — Ambulatory Visit: Payer: Medicare Other | Admitting: Family Medicine

## 2018-11-22 ENCOUNTER — Ambulatory Visit (INDEPENDENT_AMBULATORY_CARE_PROVIDER_SITE_OTHER): Payer: Medicare Other | Admitting: Family Medicine

## 2018-11-22 ENCOUNTER — Encounter: Payer: Self-pay | Admitting: Family Medicine

## 2018-11-22 VITALS — BP 142/78 | HR 85 | Temp 97.9°F | Ht 64.0 in | Wt 153.0 lb

## 2018-11-22 DIAGNOSIS — Z23 Encounter for immunization: Secondary | ICD-10-CM | POA: Diagnosis not present

## 2018-11-22 DIAGNOSIS — B0229 Other postherpetic nervous system involvement: Secondary | ICD-10-CM | POA: Insufficient documentation

## 2018-11-22 DIAGNOSIS — I6521 Occlusion and stenosis of right carotid artery: Secondary | ICD-10-CM | POA: Diagnosis not present

## 2018-11-22 DIAGNOSIS — E785 Hyperlipidemia, unspecified: Secondary | ICD-10-CM

## 2018-11-22 DIAGNOSIS — I10 Essential (primary) hypertension: Secondary | ICD-10-CM | POA: Diagnosis not present

## 2018-11-22 HISTORY — DX: Other postherpetic nervous system involvement: B02.29

## 2018-11-22 LAB — CBC
HEMATOCRIT: 41.8 % (ref 36.0–46.0)
Hemoglobin: 13.8 g/dL (ref 12.0–15.0)
MCHC: 33 g/dL (ref 30.0–36.0)
MCV: 81.9 fl (ref 78.0–100.0)
PLATELETS: 187 10*3/uL (ref 150.0–400.0)
RBC: 5.1 Mil/uL (ref 3.87–5.11)
RDW: 13.6 % (ref 11.5–15.5)
WBC: 6.3 10*3/uL (ref 4.0–10.5)

## 2018-11-22 LAB — COMPREHENSIVE METABOLIC PANEL
ALT: 7 U/L (ref 0–35)
AST: 11 U/L (ref 0–37)
Albumin: 4.1 g/dL (ref 3.5–5.2)
Alkaline Phosphatase: 88 U/L (ref 39–117)
BUN: 18 mg/dL (ref 6–23)
CHLORIDE: 106 meq/L (ref 96–112)
CO2: 24 meq/L (ref 19–32)
CREATININE: 0.94 mg/dL (ref 0.40–1.20)
Calcium: 9.6 mg/dL (ref 8.4–10.5)
GFR: 73.53 mL/min (ref >60.00)
Glucose, Bld: 84 mg/dL (ref 70–99)
Potassium: 4.7 mEq/L (ref 3.5–5.1)
Sodium: 140 mEq/L (ref 135–145)
Total Bilirubin: 0.5 mg/dL (ref 0.2–1.2)
Total Protein: 7.2 g/dL (ref 6.0–8.3)

## 2018-11-22 LAB — LIPID PANEL
CHOL/HDL RATIO: 2
Cholesterol: 113 mg/dL (ref 0–200)
HDL: 46.2 mg/dL (ref >39.00)
LDL CALC: 44 mg/dL (ref 0–99)
NonHDL: 66.91
TRIGLYCERIDES: 114 mg/dL (ref 0.0–149.0)
VLDL: 22.8 mg/dL (ref 0.0–40.0)

## 2018-11-22 LAB — TSH: TSH: 1 u[IU]/mL (ref 0.35–4.50)

## 2018-11-22 MED ORDER — CHLORPHENIRAMINE-DM 1-7.5 MG/5ML PO SYRP: 5.0000 mL | ORAL_SOLUTION | Freq: Three times a day (TID) | ORAL | 0 refills | Status: DC | PRN

## 2018-11-22 MED ORDER — GABAPENTIN 300 MG PO CAPS: 300.0000 mg | ORAL_CAPSULE | Freq: Every day | ORAL | 0 refills | Status: DC

## 2018-11-22 NOTE — Assessment & Plan Note (Signed)
Blood pressure is at goal for age and co-morbidities.  I recommend she continue current medications.  In addition they were instructed to follow a low sodium diet with regular exercise to help to maintain adequate control of blood pressure.

## 2018-11-22 NOTE — Assessment & Plan Note (Signed)
-  Taking gabapentin as needed, rx renewed.

## 2018-11-22 NOTE — Assessment & Plan Note (Signed)
-  S/p endarterectomy -Continue statin -Update lipid panel

## 2018-11-22 NOTE — Progress Notes (Signed)
Linda [name] - 82 y.o. female MRN [id number]  Date of birth: August 16, 1936  Subjective Chief Complaint  Patient presents with  . Hypertension    doing well, wants to discuss Gabapentin    HPI Linda [name] is a 82 y.o. female with history of HTN, breast cancer, carotid artery disease, and GERD here today for f/u of HTN.  She also reports she has been using gabapentin as needed for itching and tingling sensation related to post-herpetic neuralgia. States that she only uses 4-5x per year but that it works well. She continues to do well with current blood pressure medication and has not had side effects including symptoms of hypotension.  She has not had chest pain, shortness of breath, palpitations, or headache.  She has had a mild cough, recently started using a humidifier.  She is requesting cough syrup rx.   ROS:  A comprehensive ROS was completed and negative except as noted per HPI  No Known Allergies  Past Medical History:  Diagnosis Date  . Arthritis   . Breast cancer ([place])   . Constipation   . Diverticulosis of colon (without mention of hemorrhage)   . GERD (gastroesophageal reflux disease)   . Hypertension   . Nail fungus   . Uterine fibroid     Past Surgical History:  Procedure Laterality Date  . ABDOMINAL HYSTERECTOMY    . BREAST LUMPECTOMY Right 2004   radiation and chemo  . CHOLECYSTECTOMY  ~1980  . COLONOSCOPY     Hx; of  . ENDARTERECTOMY Right 01/19/2014   Procedure: ENDARTERECTOMY CAROTID;  Surgeon: Serafina Mitchell, MD;  Location: [facility] OR;  Service: Vascular;  Laterality: Right;  . Right lumpectomy with right sentinel lymph node dissection  2004   Dr. Bubba Camp  . VAGINAL HYSTERECTOMY      Social History   Socioeconomic History  . Marital status: Single    Spouse name: Not on file  . Number of children: 2  . Years of education: Not on file  . Highest education level: Not on file  Occupational History  . Occupation: Retired  Scientific laboratory technician  . Financial  resource strain: Not on file  . Food insecurity:    Worry: Not on file    Inability: Not on file  . Transportation needs:    Medical: Not on file    Non-medical: Not on file  Tobacco Use  . Smoking status: Current Every Day Smoker    Packs/day: 1.00    Types: Cigarettes  . Smokeless tobacco: Never Used  . Tobacco comment: form given 12/14/11  Substance and Sexual Activity  . Alcohol use: No    Alcohol/week: 0.0 standard drinks  . Drug use: No  . Sexual activity: Never  Lifestyle  . Physical activity:    Days per week: Not on file    Minutes per session: Not on file  . Stress: Not on file  Relationships  . Social connections:    Talks on phone: Not on file    Gets together: Not on file    Attends religious service: Not on file    Active member of club or organization: Not on file    Attends meetings of clubs or organizations: Not on file    Relationship status: Not on file  Other Topics Concern  . Not on file  Social History Narrative  . Not on file    Family History  Problem Relation Age of Onset  . Colon cancer Mother   .  Cancer Mother        Kidney  . Pneumonia Father 29       Double  . Colon cancer Maternal Grandmother     Health Maintenance  Topic Date Due  . TETANUS/TDAP  01/14/1957  . PNA vac Low Risk Adult (2 of 2 - PCV13) 09/30/2015  . INFLUENZA VACCINE  07/07/2018  . DEXA SCAN  Completed    ----------------------------------------------------------------------------------------------------------------------------------------------------------------------------------------------------------------- Physical Exam BP (!) 142/78   Pulse 85   Temp 97.9 F (36.6 C) (Oral)   Ht 5\' 4"  (1.626 m)   Wt 153 lb (69.4 kg)   SpO2 97%   BMI 26.26 kg/m   Physical Exam Constitutional:      Appearance: Normal appearance.  HENT:     Head: Normocephalic and atraumatic.     Mouth/Throat:     Mouth: Mucous membranes are moist.  Eyes:     General: No scleral  icterus. Neck:     Musculoskeletal: Neck supple. No muscular tenderness.  Cardiovascular:     Rate and Rhythm: Normal rate and regular rhythm.  Pulmonary:     Effort: Pulmonary effort is normal.     Breath sounds: Normal breath sounds.  Skin:    General: Skin is warm and dry.  Neurological:     General: No focal deficit present.     Mental Status: She is alert and oriented to person, place, and time.  Psychiatric:        Mood and Affect: Mood normal.        Behavior: Behavior normal.     ------------------------------------------------------------------------------------------------------------------------------------------------------------------------------------------------------------------- Assessment and Plan  Carotid artery disease ([facility]) -S/p endarterectomy -Continue statin -Update lipid panel  Essential hypertension Blood pressure is at goal for age and co-morbidities.  I recommend she continue current medications.  In addition they were instructed to follow a low sodium diet with regular exercise to help to maintain adequate control of blood pressure.    Post herpetic neuralgia -Taking gabapentin as needed, rx renewed.

## 2018-11-22 NOTE — Patient Instructions (Signed)
-  You may continue gabapentin as needed for previous shingles pain.  -We'll be in touch with lab results.

## 2018-11-28 ENCOUNTER — Ambulatory Visit: Payer: Self-pay | Admitting: *Deleted

## 2018-11-28 NOTE — Telephone Encounter (Signed)
Contacted pt; pt informed that this medication can be purchased OTC; she verbalized understanding; will route to office for notification of this encounter.  Reason for Disposition . Caller requesting a NON-URGENT new prescription or refill and triager unable to refill per unit policy  Answer Assessment - Initial Assessment Questions 1. SYMPTOMS: "Do you have any symptoms?"     no 2. SEVERITY: If symptoms are present, ask "Are they mild, moderate or severe?"     n/a  Protocols used: MEDICATION QUESTION CALL-A-AH

## 2018-11-28 NOTE — Telephone Encounter (Signed)
Spoke with Kennyth Lose, [facility] at [facility]; she states this medication is OTC; will notify the pt

## 2018-12-19 ENCOUNTER — Other Ambulatory Visit: Payer: Self-pay | Admitting: Family Medicine

## 2019-01-09 NOTE — Progress Notes (Signed)
Subjective:   Linda [name] is a 83 y.o. female who presents for an Initial Medicare Annual Wellness Visit.  The Patient was informed that the wellness visit is to identify future health risk and educate and initiate measures that can reduce risk for increased disease through the lifespan.   Goes to lunch every Friday with girlfriend and BINGO on Saturdays.  Describes health as fair, good or great? "I think its good"   Review of Systems   No ROS.  Medicare Wellness Visit. Additional risk factors are reflected in the social history. Cardiac Risk Factors include: advanced age (>85men, >82 women);hypertension;smoking/ tobacco exposure;sedentary lifestyle Sleep patterns:no issues Home Safety/Smoke Alarms: Feels safe in home. Smoke alarms in place. Lives with daughter. Daughter works nights. Smokes in the house. No stairs. Step over tub with grab rails.  Female:        Mammo- 09/28/17. declines  Dexa scan- 10/23/1 Eye- [facility] every 2 yrs.    Objective:    Today's Vitals   01/11/19 0934 01/11/19 0939  BP: (!) 162/90 (!) 160/92  Pulse: 83   SpO2: 98%   Weight: 148 lb (67.1 kg)   Height: 5\' 4"  (1.626 m)    Body mass index is 25.4 kg/m.  Advanced Directives 01/11/2019 10/08/2015 08/30/2015 08/26/2015 06/22/2015 09/27/2014 01/19/2014  Does Patient Have a Medical Advance Directive? No No No No No No Patient does not have advance directive;Patient would not like information  Would patient like information on creating a medical advance directive? No - Patient declined - No - patient declined information No - patient declined information No - patient declined information No - patient declined information -  Pre-existing out of facility DNR order (yellow form or pink MOST form) - - - - - - -    Current Medications (verified) Outpatient Encounter Medications as of 01/11/2019  Medication Sig  . aspirin EC 81 MG tablet Take 1 tablet (81 mg total) by mouth daily.  Marland Kitchen atorvastatin (LIPITOR) 40 MG  tablet TAKE ONE TABLET BY MOUTH EACH EVENING  . cloNIDine (CATAPRES) 0.2 MG tablet Take 1 tablet by mouth 2 (two) times daily.  Marland Kitchen DEXILANT 60 MG capsule TAKE 1 CAPSULE BY MOUTH EVERY DAY  . gabapentin (NEURONTIN) 300 MG capsule TAKE 1 CAPSULE BY MOUTH EVERY DAY  . hydrALAZINE (APRESOLINE) 25 MG tablet TAKE 1 TABLET BY MOUTH FOUR TIMES A DAY  . mometasone (ELOCON) 0.1 % cream Apply 1 application topically daily.  . trandolapril (MAVIK) 4 MG tablet Take 4 mg by mouth daily.   . verapamil (VERELAN PM) 120 MG 24 hr capsule TAKE 2 CAPSULES EVERY EVENING  . [DISCONTINUED] Chlorpheniramine-DM 1-7.5 MG/5ML SYRP Take 5 mLs by mouth every 8 (eight) hours as needed.  . [DISCONTINUED] omeprazole (PRILOSEC) 20 MG capsule Take 1 capsule (20 mg total) by mouth daily.  . [DISCONTINUED] verapamil (CALAN-SR) 240 MG CR tablet Take 240 mg by mouth 2 (two) times daily.    No facility-administered encounter medications on file as of 01/11/2019.     Allergies (verified) Patient has no known allergies.   History: Past Medical History:  Diagnosis Date  . Arthritis   . Breast cancer ([place])   . Constipation   . Diverticulosis of colon (without mention of hemorrhage)   . GERD (gastroesophageal reflux disease)   . Hypertension   . Nail fungus   . Uterine fibroid    Past Surgical History:  Procedure Laterality Date  . ABDOMINAL HYSTERECTOMY    . BREAST LUMPECTOMY  Right 2004   radiation and chemo  . CHOLECYSTECTOMY  ~1980  . COLONOSCOPY     Hx; of  . ENDARTERECTOMY Right 01/19/2014   Procedure: ENDARTERECTOMY CAROTID;  Surgeon: Serafina Mitchell, MD;  Location: [facility] OR;  Service: Vascular;  Laterality: Right;  . Right lumpectomy with right sentinel lymph node dissection  2004   Dr. Bubba Camp  . VAGINAL HYSTERECTOMY     Family History  Problem Relation Age of Onset  . Colon cancer Mother   . Cancer Mother        Kidney  . Pneumonia Father 10       Double  . Colon cancer Maternal Grandmother    Social  History   Socioeconomic History  . Marital status: Single    Spouse name: Not on file  . Number of children: 2  . Years of education: Not on file  . Highest education level: Not on file  Occupational History  . Occupation: Retired  Scientific laboratory technician  . Financial resource strain: Not on file  . Food insecurity:    Worry: Not on file    Inability: Not on file  . Transportation needs:    Medical: Not on file    Non-medical: Not on file  Tobacco Use  . Smoking status: Current Every Day Smoker    Packs/day: 1.00    Types: Cigarettes  . Smokeless tobacco: Never Used  . Tobacco comment: form given 12/14/11  Substance and Sexual Activity  . Alcohol use: No    Alcohol/week: 0.0 standard drinks  . Drug use: No  . Sexual activity: Never  Lifestyle  . Physical activity:    Days per week: Not on file    Minutes per session: Not on file  . Stress: Not on file  Relationships  . Social connections:    Talks on phone: Not on file    Gets together: Not on file    Attends religious service: Not on file    Active member of club or organization: Not on file    Attends meetings of clubs or organizations: Not on file    Relationship status: Not on file  Other Topics Concern  . Not on file  Social History Narrative  . Not on file    Tobacco Counseling Ready to quit: No Counseling given: No Comment: form given 12/14/11   Clinical Intake: Pain : No/denies pain    Activities of Daily Living In your present state of health, do you have any difficulty performing the following activities: 01/11/2019  Hearing? N  Vision? N  Difficulty concentrating or making decisions? N  Comment loves word searches and tv  Walking or climbing stairs? Y  Comment uses cane.  Dressing or bathing? N  Doing errands, shopping? N  Preparing Food and eating ? N  Using the Toilet? N  In the past six months, have you accidently leaked urine? N  Do you have problems with loss of bowel control? N  Managing your  Medications? N  Managing your Finances? N  Housekeeping or managing your Housekeeping? N  Some recent data might be hidden     Immunizations and Health Maintenance Immunization History  Administered Date(s) Administered  . Influenza, High Dose Seasonal PF 11/22/2018  . Influenza,inj,Quad PF,6+ Mos 09/29/2014  . Pneumococcal Polysaccharide-23 09/29/2014   Health Maintenance Due  Topic Date Due  . TETANUS/TDAP  01/14/1957  . PNA vac Low Risk Adult (2 of 2 - PCV13) 09/30/2015    Patient Care Team:  Luetta Nutting, DO as PCP - General (Family Medicine) Lafayette Dragon, MD (Inactive) (Gastroenterology) Murinson, Haynes Bast, MD (Inactive) as Consulting Physician (Medical Oncology) [name], MD as Consulting Physician (Cardiology)  Indicate any recent Medical Services you may have received from other than [facility] providers in the past year (date may be approximate).     Assessment:   This is a routine wellness examination for Linda [name]. Physical assessment deferred to PCP.  Hearing/Vision screen  Hearing Screening   125Hz  250Hz  500Hz  1000Hz  2000Hz  3000Hz  4000Hz  6000Hz  8000Hz   Right ear:   Pass Pass Pass  Pass    Left ear:   Pass Pass Pass  Fail      Visual Acuity Screening   Right eye Left eye Both eyes  Without correction:     With correction: 20/25 20/25 20/25     Dietary issues and exercise activities discussed: Current Exercise Habits: The patient does not participate in regular exercise at present, Exercise limited by: None identified Diet (meal preparation, eat out, water intake, caffeinated beverages, dairy products, fruits and vegetables): well balanced, on average, 3 meals per day      Goals    . continue not to have any falls    . Increase physical activity     Do some sort of chair exercise daily.      Depression Screen PHQ 2/9 Scores 01/11/2019  PHQ - 2 Score 0    Fall Risk Fall Risk  01/11/2019  Falls in the past year? 0     Cognitive Function: MMSE -  Mini Mental State Exam 01/11/2019  Orientation to time 5  Orientation to Place 5  Registration 3  Attention/ Calculation 5  Recall 3  Language- name 2 objects 2  Language- repeat 1  Language- follow 3 step command 3  Language- read & follow direction 1  Write a sentence 1  Copy design 0  Total score 29        Screening Tests Health Maintenance  Topic Date Due  . TETANUS/TDAP  01/14/1957  . PNA vac Low Risk Adult (2 of 2 - PCV13) 09/30/2015  . INFLUENZA VACCINE  Completed  . DEXA SCAN  Completed        Plan:    Please schedule your next medicare wellness visit with me in 1 yr.  Your blood pressure was elevated today. We understand that you have had coffee, cigarettes, and not yet taken your meds. Per Dr.Matthews, return to see him in 1 month to follow up on your blood pressure. Please take your meds prior to appointment. Decrease caffeine and nicotine as discussed.  Eat heart healthy diet (full of fruits, vegetables, whole grains, lean protein, water--limit salt, fat, and sugar intake) and increase physical activity as tolerated.  Continue doing brain stimulating activities (puzzles, reading, adult coloring books, staying active) to keep memory sharp.    I have personally reviewed and noted the following in the patient's chart:   . Medical and social history . Use of alcohol, tobacco or illicit drugs  . Current medications and supplements . Functional ability and status . Nutritional status . Physical activity . Advanced directives . List of other physicians . Hospitalizations, surgeries, and ER visits in previous 12 months . Vitals . Screenings to include cognitive, depression, and falls . Referrals and appointments  In addition, I have reviewed and discussed with patient certain preventive protocols, quality metrics, and best practice recommendations. A written personalized care plan for preventive services as well as general preventive  health recommendations were  provided to patient.     Shela Nevin, [place]   01/11/2019

## 2019-01-11 ENCOUNTER — Ambulatory Visit (INDEPENDENT_AMBULATORY_CARE_PROVIDER_SITE_OTHER): Payer: Medicare Other | Admitting: *Deleted

## 2019-01-11 ENCOUNTER — Encounter: Payer: Self-pay | Admitting: *Deleted

## 2019-01-11 VITALS — BP 160/92 | HR 83 | Ht 64.0 in | Wt 148.0 lb

## 2019-01-11 DIAGNOSIS — Z0000 Encounter for general adult medical examination without abnormal findings: Secondary | ICD-10-CM

## 2019-01-11 NOTE — Patient Instructions (Signed)
Please schedule your next medicare wellness visit with me in 1 yr.  Your blood pressure was elevated today. We understand that you have had coffee, cigarettes, and not yet taken your meds. Per Dr.Matthews, return to see him in 1 month to follow up on your blood pressure. Please take your meds prior to appointment. Decrease caffeine and nicotine as discussed.  Continue to eat heart healthy diet (full of fruits, vegetables, whole grains, lean protein, water--limit salt, fat, and sugar intake) and increase physical activity as tolerated.  Continue doing brain stimulating activities (puzzles, reading, adult coloring books, staying active) to keep memory sharp.    Linda [name] , Thank you for taking time to come for your Medicare Wellness Visit. I appreciate your ongoing commitment to your health goals. Please review the following plan we discussed and let me know if I can assist you in the future.   These are the goals we discussed: Goals    . continue not to have any falls    . Increase physical activity     Do some sort of chair exercise daily.       This is a list of the screening recommended for you and due dates:  Health Maintenance  Topic Date Due  . Tetanus Vaccine  01/14/1957  . Pneumonia vaccines (2 of 2 - PCV13) 09/30/2015  . Flu Shot  Completed  . DEXA scan (bone density measurement)  Completed    Health Maintenance After Age 75 After age 17, you are at a higher risk for certain long-term diseases and infections as well as injuries from falls. Falls are a major cause of broken bones and head injuries in people who are older than age 10. Getting regular preventive care can help to keep you healthy and well. Preventive care includes getting regular testing and making lifestyle changes as recommended by your health care provider. Talk with your health care provider about:  Which screenings and tests you should have. A screening is a test that checks for a disease when you have no  symptoms.  A diet and exercise plan that is right for you. What should I know about screenings and tests to prevent falls? Screening and testing are the best ways to find a health problem early. Early diagnosis and treatment give you the best chance of managing medical conditions that are common after age 68. Certain conditions and lifestyle choices may make you more likely to have a fall. Your health care provider may recommend:  Regular vision checks. Poor vision and conditions such as cataracts can make you more likely to have a fall. If you wear glasses, make sure to get your prescription updated if your vision changes.  Medicine review. Work with your health care provider to regularly review all of the medicines you are taking, including over-the-counter medicines. Ask your health care provider about any side effects that may make you more likely to have a fall. Tell your health care provider if any medicines that you take make you feel dizzy or sleepy.  Osteoporosis screening. Osteoporosis is a condition that causes the bones to get weaker. This can make the bones weak and cause them to break more easily.  Blood pressure screening. Blood pressure changes and medicines to control blood pressure can make you feel dizzy.  Strength and balance checks. Your health care provider may recommend certain tests to check your strength and balance while standing, walking, or changing positions.  Foot health exam. Foot pain and numbness, as  well as not wearing proper footwear, can make you more likely to have a fall.  Depression screening. You may be more likely to have a fall if you have a fear of falling, feel emotionally low, or feel unable to do activities that you used to do.  Alcohol use screening. Using too much alcohol can affect your balance and may make you more likely to have a fall. What actions can I take to lower my risk of falls? General instructions  Talk with your health care  provider about your risks for falling. Tell your health care provider if: ? You fall. Be sure to tell your health care provider about all falls, even ones that seem minor. ? You feel dizzy, sleepy, or off-balance.  Take over-the-counter and prescription medicines only as told by your health care provider. These include any supplements.  Eat a healthy diet and maintain a healthy weight. A healthy diet includes low-fat dairy products, low-fat (lean) meats, and fiber from whole grains, beans, and lots of fruits and vegetables. Home safety  Remove any tripping hazards, such as rugs, cords, and clutter.  Install safety equipment such as grab bars in bathrooms and safety rails on stairs.  Keep rooms and walkways well-lit. Activity   Follow a regular exercise program to stay fit. This will help you maintain your balance. Ask your health care provider what types of exercise are appropriate for you.  If you need a cane or walker, use it as recommended by your health care provider.  Wear supportive shoes that have nonskid soles. Lifestyle  Do not drink alcohol if your health care provider tells you not to drink.  If you drink alcohol, limit how much you have: ? 0-1 drink a day for women. ? 0-2 drinks a day for men.  Be aware of how much alcohol is in your drink. In the [place] one drink equals one typical bottle of beer (12 oz), one-half glass of wine (5 oz), or one shot of hard liquor (1 oz).  Do not use any products that contain nicotine or tobacco, such as cigarettes and e-cigarettes. If you need help quitting, ask your health care provider. Summary  Having a healthy lifestyle and getting preventive care can help to protect your health and wellness after age 43.  Screening and testing are the best way to find a health problem early and help you avoid having a fall. Early diagnosis and treatment give you the best chance for managing medical conditions that are more common for people who  are older than age 45.  Falls are a major cause of broken bones and head injuries in people who are older than age 61. Take precautions to prevent a fall at home.  Work with your health care provider to learn what changes you can make to improve your health and wellness and to prevent falls. This information is not intended to replace advice given to you by your health care provider. Make sure you discuss any questions you have with your health care provider. Document Released: 10/06/2017 Document Revised: 10/06/2017 Document Reviewed: 10/06/2017 Elsevier Interactive Patient Education  2019 [employer]  Hypertension Hypertension, commonly called high blood pressure, is when the force of blood pumping through the arteries is too strong. The arteries are the blood vessels that carry blood from the heart throughout the body. Hypertension forces the heart to work harder to pump blood and may cause arteries to become narrow or stiff. Having untreated or uncontrolled hypertension can cause  heart attacks, strokes, kidney disease, and other problems. A blood pressure reading consists of a higher number over a lower number. Ideally, your blood pressure should be below 120/80. The first ("top") number is called the systolic pressure. It is a measure of the pressure in your arteries as your heart beats. The second ("bottom") number is called the diastolic pressure. It is a measure of the pressure in your arteries as the heart relaxes. What are the causes? The cause of this condition is not known. What increases the risk? Some risk factors for high blood pressure are under your control. Others are not. Factors you can change  Smoking.  Having type 2 diabetes mellitus, high cholesterol, or both.  Not getting enough exercise or physical activity.  Being overweight.  Having too much fat, sugar, calories, or salt (sodium) in your diet.  Drinking too much alcohol. Factors that are difficult or  impossible to change  Having chronic kidney disease.  Having a family history of high blood pressure.  Age. Risk increases with age.  Race. You may be at higher risk if you are African-American.  Gender. Men are at higher risk than women before age 1. After age 41, women are at higher risk than men.  Having obstructive sleep apnea.  Stress. What are the signs or symptoms? Extremely high blood pressure (hypertensive crisis) may cause:  Headache.  Anxiety.  Shortness of breath.  Nosebleed.  Nausea and vomiting.  Severe chest pain.  Jerky movements you cannot control (seizures). How is this diagnosed? This condition is diagnosed by measuring your blood pressure while you are seated, with your arm resting on a surface. The cuff of the blood pressure monitor will be placed directly against the skin of your upper arm at the level of your heart. It should be measured at least twice using the same arm. Certain conditions can cause a difference in blood pressure between your right and left arms. Certain factors can cause blood pressure readings to be lower or higher than normal (elevated) for a short period of time:  When your blood pressure is higher when you are in a health care provider's office than when you are at home, this is called white coat hypertension. Most people with this condition do not need medicines.  When your blood pressure is higher at home than when you are in a health care provider's office, this is called masked hypertension. Most people with this condition may need medicines to control blood pressure. If you have a high blood pressure reading during one visit or you have normal blood pressure with other risk factors:  You may be asked to return on a different day to have your blood pressure checked again.  You may be asked to monitor your blood pressure at home for 1 week or longer. If you are diagnosed with hypertension, you may have other blood or imaging  tests to help your health care provider understand your overall risk for other conditions. How is this treated? This condition is treated by making healthy lifestyle changes, such as eating healthy foods, exercising more, and reducing your alcohol intake. Your health care provider may prescribe medicine if lifestyle changes are not enough to get your blood pressure under control, and if:  Your systolic blood pressure is above 130.  Your diastolic blood pressure is above 80. Your personal target blood pressure may vary depending on your medical conditions, your age, and other factors. Follow these instructions at home: Eating and drinking  Eat a diet that is high in fiber and potassium, and low in sodium, added sugar, and fat. An example eating plan is called the DASH (Dietary Approaches to Stop Hypertension) diet. To eat this way: ? Eat plenty of fresh fruits and vegetables. Try to fill half of your plate at each meal with fruits and vegetables. ? Eat whole grains, such as whole wheat pasta, brown rice, or whole grain bread. Fill about one quarter of your plate with whole grains. ? Eat or drink low-fat dairy products, such as skim milk or low-fat yogurt. ? Avoid fatty cuts of meat, processed or cured meats, and poultry with skin. Fill about one quarter of your plate with lean proteins, such as fish, chicken without skin, beans, eggs, and tofu. ? Avoid premade and processed foods. These tend to be higher in sodium, added sugar, and fat.  Reduce your daily sodium intake. Most people with hypertension should eat less than 1,500 mg of sodium a day.  Limit alcohol intake to no more than 1 drink a day for nonpregnant women and 2 drinks a day for men. One drink equals 12 oz of beer, 5 oz of wine, or 1 oz of hard liquor. Lifestyle   Work with your health care provider to maintain a healthy body weight or to lose weight. Ask what an ideal weight is for you.  Get at least 30 minutes of exercise  that causes your heart to beat faster (aerobic exercise) most days of the week. Activities may include walking, swimming, or biking.  Include exercise to strengthen your muscles (resistance exercise), such as pilates or lifting weights, as part of your weekly exercise routine. Try to do these types of exercises for 30 minutes at least 3 days a week.  Do not use any products that contain nicotine or tobacco, such as cigarettes and e-cigarettes. If you need help quitting, ask your health care provider.  Monitor your blood pressure at home as told by your health care provider.  Keep all follow-up visits as told by your health care provider. This is important. Medicines  Take over-the-counter and prescription medicines only as told by your health care provider. Follow directions carefully. Blood pressure medicines must be taken as prescribed.  Do not skip doses of blood pressure medicine. Doing this puts you at risk for problems and can make the medicine less effective.  Ask your health care provider about side effects or reactions to medicines that you should watch for. Contact a health care provider if:  You think you are having a reaction to a medicine you are taking.  You have headaches that keep coming back (recurring).  You feel dizzy.  You have swelling in your ankles.  You have trouble with your vision. Get help right away if:  You develop a severe headache or confusion.  You have unusual weakness or numbness.  You feel faint.  You have severe pain in your chest or abdomen.  You vomit repeatedly.  You have trouble breathing. Summary  Hypertension is when the force of blood pumping through your arteries is too strong. If this condition is not controlled, it may put you at risk for serious complications.  Your personal target blood pressure may vary depending on your medical conditions, your age, and other factors. For most people, a normal blood pressure is less than  120/80.  Hypertension is treated with lifestyle changes, medicines, or a combination of both. Lifestyle changes include weight loss, eating a healthy, low-sodium diet, exercising  more, and limiting alcohol. This information is not intended to replace advice given to you by your health care provider. Make sure you discuss any questions you have with your health care provider. Document Released: 11/23/2005 Document Revised: 10/21/2016 Document Reviewed: 10/21/2016 Elsevier Interactive Patient Education  2019 [employer].

## 2019-01-13 NOTE — Progress Notes (Signed)
Note reviewed as documented by Naaman Plummer, RN. I agree with findings in note as outlined.  BP noted to elevated however she did not take medication yet today. She is asymptomatic and BP has been fairly well controlled in the past.  She will see me again in 1 month for f/u of this and reminded to take medication prior to appt time.

## 2019-02-01 ENCOUNTER — Telehealth: Payer: Self-pay | Admitting: Family Medicine

## 2019-02-01 NOTE — Telephone Encounter (Signed)
Returned call to patient to [facility], but patient did not answer. Will try to call patient at a later time. SF

## 2019-02-05 ENCOUNTER — Other Ambulatory Visit: Payer: Self-pay | Admitting: Family Medicine

## 2019-02-09 ENCOUNTER — Other Ambulatory Visit: Payer: Self-pay

## 2019-02-09 ENCOUNTER — Encounter: Payer: Self-pay | Admitting: Family Medicine

## 2019-02-09 ENCOUNTER — Ambulatory Visit (INDEPENDENT_AMBULATORY_CARE_PROVIDER_SITE_OTHER): Payer: Medicare Other | Admitting: Family Medicine

## 2019-02-09 DIAGNOSIS — Z720 Tobacco use: Secondary | ICD-10-CM

## 2019-02-09 DIAGNOSIS — I10 Essential (primary) hypertension: Secondary | ICD-10-CM

## 2019-02-09 NOTE — Progress Notes (Signed)
[name] - 83 y.o. female MRN [id number]  Date of birth: 11-17-1936  Subjective Chief Complaint  Patient presents with  . Follow-up  . Hypertension    HPI Linda [name] is a 83 y.o. female here today for follow up of HTN and nicotine dependence.    -HTN:  BP noted to be elevated at recent AWV however she had not taken her medications yet that day.  BP much better today.  She unfortunately continues to smoke and has not desire to quit at this time.  She smokes 1.5ppd.  She has tried chantix in the past but could not tolerate due to nausea.  She denies chest pain, shortness of breath, palpitations, headache or vision changes.  ROS:  A comprehensive ROS was completed and negative except as noted per HPI  No Known Allergies  Past Medical History:  Diagnosis Date  . Arthritis   . Breast cancer ([place])   . Constipation   . Diverticulosis of colon (without mention of hemorrhage)   . GERD (gastroesophageal reflux disease)   . Hypertension   . Nail fungus   . Uterine fibroid     Past Surgical History:  Procedure Laterality Date  . ABDOMINAL HYSTERECTOMY    . BREAST LUMPECTOMY Right 2004   radiation and chemo  . CHOLECYSTECTOMY  ~1980  . COLONOSCOPY     Hx; of  . ENDARTERECTOMY Right 01/19/2014   Procedure: ENDARTERECTOMY CAROTID;  Surgeon: Serafina Mitchell, MD;  Location: [facility] OR;  Service: Vascular;  Laterality: Right;  . Right lumpectomy with right sentinel lymph node dissection  2004   Dr. Bubba Camp  . VAGINAL HYSTERECTOMY      Social History   Socioeconomic History  . Marital status: Single    Spouse name: Not on file  . Number of children: 2  . Years of education: Not on file  . Highest education level: Not on file  Occupational History  . Occupation: Retired  Scientific laboratory technician  . Financial resource strain: Not on file  . Food insecurity:    Worry: Not on file    Inability: Not on file  . Transportation needs:    Medical: Not on file    Non-medical: Not on file    Tobacco Use  . Smoking status: Current Every Day Smoker    Packs/day: 1.00    Types: Cigarettes  . Smokeless tobacco: Never Used  . Tobacco comment: form given 12/14/11  Substance and Sexual Activity  . Alcohol use: No    Alcohol/week: 0.0 standard drinks  . Drug use: No  . Sexual activity: Never  Lifestyle  . Physical activity:    Days per week: Not on file    Minutes per session: Not on file  . Stress: Not on file  Relationships  . Social connections:    Talks on phone: Not on file    Gets together: Not on file    Attends religious service: Not on file    Active member of club or organization: Not on file    Attends meetings of clubs or organizations: Not on file    Relationship status: Not on file  Other Topics Concern  . Not on file  Social History Narrative  . Not on file    Family History  Problem Relation Age of Onset  . Colon cancer Mother   . Cancer Mother        Kidney  . Pneumonia Father 13       Double  .  Colon cancer Maternal Grandmother     Health Maintenance  Topic Date Due  . TETANUS/TDAP  01/14/1957  . PNA vac Low Risk Adult (2 of 2 - PCV13) 09/30/2015  . INFLUENZA VACCINE  Completed  . DEXA SCAN  Completed    ----------------------------------------------------------------------------------------------------------------------------------------------------------------------------------------------------------------- Physical Exam BP 130/64 (BP Location: Right Arm, Patient Position: Sitting, Cuff Size: Normal)   Pulse 80   Temp 97.6 F (36.4 C) (Oral)   Ht 5\' 4"  (1.626 m)   Wt 150 lb (68 kg)   SpO2 100%   BMI 25.75 kg/m   Physical Exam Constitutional:      Appearance: Normal appearance.  HENT:     Head: Normocephalic and atraumatic.     Mouth/Throat:     Mouth: Mucous membranes are moist.  Eyes:     General: No scleral icterus. Neck:     Musculoskeletal: Neck supple.  Cardiovascular:     Rate and Rhythm: Normal rate and regular  rhythm.  Pulmonary:     Effort: Pulmonary effort is normal.     Breath sounds: Normal breath sounds.  Skin:    General: Skin is warm and dry.     Findings: No rash.  Neurological:     General: No focal deficit present.     Mental Status: She is alert.  Psychiatric:        Mood and Affect: Mood normal.        Behavior: Behavior normal.     ------------------------------------------------------------------------------------------------------------------------------------------------------------------------------------------------------------------- Assessment and Plan  Essential hypertension -BP improved and stable, she will continue current medications.   Tobacco abuse -Given recommendations to quit smoking however she does not want to quit at this time -Will readdress at f/u in 6 months.

## 2019-02-09 NOTE — Assessment & Plan Note (Signed)
-  BP improved and stable, she will continue current medications.

## 2019-02-09 NOTE — Patient Instructions (Signed)

## 2019-02-09 NOTE — Assessment & Plan Note (Signed)
-  Given recommendations to quit smoking however she does not want to quit at this time -Will readdress at f/u in 6 months.

## 2019-03-14 ENCOUNTER — Other Ambulatory Visit: Payer: Self-pay | Admitting: Family Medicine

## 2019-04-06 ENCOUNTER — Other Ambulatory Visit: Payer: Self-pay | Admitting: Family Medicine

## 2019-04-13 ENCOUNTER — Other Ambulatory Visit: Payer: Self-pay | Admitting: Family Medicine

## 2019-05-03 ENCOUNTER — Other Ambulatory Visit: Payer: Self-pay | Admitting: Family Medicine

## 2019-06-13 ENCOUNTER — Telehealth: Payer: Self-pay

## 2019-06-13 ENCOUNTER — Ambulatory Visit: Payer: Medicare Other | Admitting: Family Medicine

## 2019-06-13 NOTE — Telephone Encounter (Signed)
Questions for Screening COVID-19  Symptom onset: None Travel or Contacts: none  During this illness, did/does the patient experience any of the following symptoms? Fever >100.18F []   Yes [x]   No []   Unknown Subjective fever (felt feverish) []   Yes [x]   No []   Unknown Chills []   Yes [x]   No []   Unknown Muscle aches (myalgia) []   Yes [x]   No []   Unknown Runny nose (rhinorrhea) []   Yes [x]   No []   Unknown Sore throat []   Yes [x]   No []   Unknown Cough (new onset or worsening of chronic cough) []   Yes [x]   No []   Unknown Shortness of breath (dyspnea) []   Yes [x]   No []   Unknown Nausea or vomiting []   Yes [x]   No []   Unknown Headache []   Yes [x]   No []   Unknown Abdominal pain  [x]   Yes []   No []   Unknown Diarrhea (?3 loose/looser than normal stools/24hr period) []   Yes [x]   No []   Unknown Other, specify:  Patient risk factors: Smoker? []   Current []   Former [x]   Never If female, currently pregnant? []   Yes [x]   No  Patient Active Problem List   Diagnosis Date Noted  . Post herpetic neuralgia 11/22/2018  . Cardiac murmur 05/16/2018  . GERD (gastroesophageal reflux disease) 05/16/2018  . Elevated lipase 09/28/2014  . Hypokalemia 09/28/2014  . Tobacco abuse 09/28/2014  . Pancreatitis, acute 09/28/2014  . Carotid artery disease ([place]) 12/18/2013  . Peripheral vascular disease ([place]) 02/24/2012  . Malignant neoplasm of female breast ([place]) 04/23/2008  . Essential hypertension 04/23/2008  . DIVERTICULOSIS OF COLON 04/23/2008  . CONSTIPATION, CHRONIC 04/23/2008    Plan:  []   High risk for COVID-19 with red flags go to ED (with CP, SOB, weak/lightheaded, or fever > 101.5). Call ahead.  []   High risk for COVID-19 but stable. Inform provider and coordinate time for [facility] visit.   [x]   No red flags but URI signs or symptoms okay for [facility] visit.

## 2019-06-14 ENCOUNTER — Telehealth: Payer: Self-pay

## 2019-06-14 ENCOUNTER — Ambulatory Visit (INDEPENDENT_AMBULATORY_CARE_PROVIDER_SITE_OTHER): Payer: Medicare Other | Admitting: Family Medicine

## 2019-06-14 ENCOUNTER — Ambulatory Visit (HOSPITAL_BASED_OUTPATIENT_CLINIC_OR_DEPARTMENT_OTHER): Admission: RE | Admit: 2019-06-14 | Payer: Medicare Other | Source: Ambulatory Visit

## 2019-06-14 ENCOUNTER — Emergency Department (HOSPITAL_COMMUNITY)
Admission: EM | Admit: 2019-06-14 | Discharge: 2019-06-14 | Discharge status: Against Medical Advice | Payer: Medicare Other | Source: Home / Self Care

## 2019-06-14 ENCOUNTER — Encounter: Payer: Self-pay | Admitting: Family Medicine

## 2019-06-14 ENCOUNTER — Encounter (HOSPITAL_COMMUNITY): Payer: Self-pay | Admitting: Emergency Medicine

## 2019-06-14 DIAGNOSIS — J449 Chronic obstructive pulmonary disease, unspecified: Secondary | ICD-10-CM | POA: Diagnosis not present

## 2019-06-14 DIAGNOSIS — R8271 Bacteriuria: Secondary | ICD-10-CM | POA: Diagnosis not present

## 2019-06-14 DIAGNOSIS — U071 COVID-19: Secondary | ICD-10-CM | POA: Diagnosis not present

## 2019-06-14 DIAGNOSIS — N179 Acute kidney failure, unspecified: Secondary | ICD-10-CM | POA: Diagnosis not present

## 2019-06-14 DIAGNOSIS — Z5321 Procedure and treatment not carried out due to patient leaving prior to being seen by health care provider: Secondary | ICD-10-CM | POA: Insufficient documentation

## 2019-06-14 DIAGNOSIS — R8281 Pyuria: Secondary | ICD-10-CM | POA: Diagnosis not present

## 2019-06-14 DIAGNOSIS — I998 Other disorder of circulatory system: Secondary | ICD-10-CM | POA: Diagnosis not present

## 2019-06-14 DIAGNOSIS — N2889 Other specified disorders of kidney and ureter: Secondary | ICD-10-CM | POA: Diagnosis not present

## 2019-06-14 DIAGNOSIS — M1990 Unspecified osteoarthritis, unspecified site: Secondary | ICD-10-CM | POA: Diagnosis not present

## 2019-06-14 DIAGNOSIS — K5730 Diverticulosis of large intestine without perforation or abscess without bleeding: Secondary | ICD-10-CM | POA: Diagnosis not present

## 2019-06-14 DIAGNOSIS — Z9221 Personal history of antineoplastic chemotherapy: Secondary | ICD-10-CM | POA: Diagnosis not present

## 2019-06-14 DIAGNOSIS — E875 Hyperkalemia: Secondary | ICD-10-CM | POA: Diagnosis not present

## 2019-06-14 DIAGNOSIS — K922 Gastrointestinal hemorrhage, unspecified: Secondary | ICD-10-CM | POA: Diagnosis not present

## 2019-06-14 DIAGNOSIS — Z79899 Other long term (current) drug therapy: Secondary | ICD-10-CM | POA: Diagnosis not present

## 2019-06-14 DIAGNOSIS — R1030 Lower abdominal pain, unspecified: Secondary | ICD-10-CM | POA: Diagnosis not present

## 2019-06-14 DIAGNOSIS — I70209 Unspecified atherosclerosis of native arteries of extremities, unspecified extremity: Secondary | ICD-10-CM | POA: Diagnosis not present

## 2019-06-14 DIAGNOSIS — D696 Thrombocytopenia, unspecified: Secondary | ICD-10-CM | POA: Diagnosis not present

## 2019-06-14 DIAGNOSIS — K219 Gastro-esophageal reflux disease without esophagitis: Secondary | ICD-10-CM | POA: Diagnosis not present

## 2019-06-14 DIAGNOSIS — I700 Atherosclerosis of aorta: Secondary | ICD-10-CM | POA: Diagnosis not present

## 2019-06-14 DIAGNOSIS — Z66 Do not resuscitate: Secondary | ICD-10-CM | POA: Diagnosis not present

## 2019-06-14 DIAGNOSIS — Z7982 Long term (current) use of aspirin: Secondary | ICD-10-CM | POA: Diagnosis not present

## 2019-06-14 DIAGNOSIS — I959 Hypotension, unspecified: Secondary | ICD-10-CM | POA: Diagnosis not present

## 2019-06-14 DIAGNOSIS — I2510 Atherosclerotic heart disease of native coronary artery without angina pectoris: Secondary | ICD-10-CM | POA: Diagnosis not present

## 2019-06-14 DIAGNOSIS — R1032 Left lower quadrant pain: Secondary | ICD-10-CM | POA: Diagnosis not present

## 2019-06-14 DIAGNOSIS — K921 Melena: Secondary | ICD-10-CM | POA: Diagnosis not present

## 2019-06-14 DIAGNOSIS — I10 Essential (primary) hypertension: Secondary | ICD-10-CM | POA: Diagnosis not present

## 2019-06-14 DIAGNOSIS — M48061 Spinal stenosis, lumbar region without neurogenic claudication: Secondary | ICD-10-CM | POA: Diagnosis not present

## 2019-06-14 HISTORY — DX: Lower abdominal pain, unspecified: R10.30

## 2019-06-14 LAB — CBC WITH DIFFERENTIAL/PLATELET
Basophils Absolute: 0 10*3/uL (ref 0.0–0.1)
Basophils Relative: 0.8 % (ref 0.0–3.0)
Eosinophils Absolute: 0 10*3/uL (ref 0.0–0.7)
Eosinophils Relative: 0.1 % (ref 0.0–5.0)
HCT: 43.8 % (ref 36.0–46.0)
Hemoglobin: 14.1 g/dL (ref 12.0–15.0)
Lymphocytes Relative: 29.9 % (ref 12.0–46.0)
Lymphs Abs: 1.4 10*3/uL (ref 0.7–4.0)
MCHC: 32.2 g/dL (ref 30.0–36.0)
MCV: 82.2 fl (ref 78.0–100.0)
Monocytes Absolute: 0.7 10*3/uL (ref 0.1–1.0)
Monocytes Relative: 14.2 % — ABNORMAL HIGH (ref 3.0–12.0)
Neutro Abs: 2.6 10*3/uL (ref 1.4–7.7)
Neutrophils Relative %: 55 % (ref 43.0–77.0)
Platelets: 161 10*3/uL (ref 150.0–400.0)
RBC: 5.33 Mil/uL — ABNORMAL HIGH (ref 3.87–5.11)
RDW: 14 % (ref 11.5–15.5)
WBC: 4.8 10*3/uL (ref 4.0–10.5)

## 2019-06-14 LAB — POCT URINALYSIS DIPSTICK
Bilirubin, UA: NEGATIVE
Blood, UA: NEGATIVE
Glucose, UA: NEGATIVE
Ketones, UA: NEGATIVE
Nitrite, UA: NEGATIVE
Protein, UA: NEGATIVE
Spec Grav, UA: 1.02 (ref 1.010–1.025)
Urobilinogen, UA: 0.2 E.U./dL
pH, UA: 6 (ref 5.0–8.0)

## 2019-06-14 LAB — COMPREHENSIVE METABOLIC PANEL
ALT: 10 U/L (ref 0–35)
ALT: 13 U/L (ref 0–44)
AST: 20 U/L (ref 0–37)
AST: 25 U/L (ref 15–41)
Albumin: 4 g/dL (ref 3.5–5.2)
Albumin: 3.5 g/dL (ref 3.5–5.0)
Alkaline Phosphatase: 100 U/L (ref 39–117)
Alkaline Phosphatase: 88 U/L (ref 38–126)
Anion gap: 13 (ref 5–15)
BUN: 26 mg/dL — ABNORMAL HIGH (ref 6–23)
BUN: 27 mg/dL — ABNORMAL HIGH (ref 8–23)
CO2: 21 mEq/L (ref 19–32)
CO2: 20 mmol/L — ABNORMAL LOW (ref 22–32)
Calcium: 8.9 mg/dL (ref 8.4–10.5)
Calcium: 8.7 mg/dL — ABNORMAL LOW (ref 8.9–10.3)
Chloride: 107 mEq/L (ref 96–112)
Chloride: 106 mmol/L (ref 98–111)
Creatinine, Ser: 1.71 mg/dL — ABNORMAL HIGH (ref 0.40–1.20)
Creatinine, Ser: 2.12 mg/dL — ABNORMAL HIGH (ref 0.44–1.00)
GFR calc Af Amer: 25 mL/min — ABNORMAL LOW (ref >60)
GFR calc non Af Amer: 21 mL/min — ABNORMAL LOW (ref >60)
GFR: 34.63 mL/min — ABNORMAL LOW (ref >60.00)
Glucose, Bld: 92 mg/dL (ref 70–99)
Glucose, Bld: 89 mg/dL (ref 70–99)
Potassium: 5.4 mEq/L — ABNORMAL HIGH (ref 3.5–5.1)
Potassium: 4.5 mmol/L (ref 3.5–5.1)
Sodium: 141 mEq/L (ref 135–145)
Sodium: 139 mmol/L (ref 135–145)
Total Bilirubin: 0.4 mg/dL (ref 0.2–1.2)
Total Bilirubin: 0.7 mg/dL (ref 0.3–1.2)
Total Protein: 7.5 g/dL (ref 6.0–8.3)
Total Protein: 7.6 g/dL (ref 6.5–8.1)

## 2019-06-14 LAB — LIPASE, BLOOD: Lipase: 35 U/L (ref 11–51)

## 2019-06-14 LAB — CBC
HCT: 43.8 % (ref 36.0–46.0)
Hemoglobin: 13.5 g/dL (ref 12.0–15.0)
MCH: 26.1 pg (ref 26.0–34.0)
MCHC: 30.8 g/dL (ref 30.0–36.0)
MCV: 84.7 fL (ref 80.0–100.0)
Platelets: 165 10*3/uL (ref 150–400)
RBC: 5.17 MIL/uL — ABNORMAL HIGH (ref 3.87–5.11)
RDW: 13.4 % (ref 11.5–15.5)
WBC: 4.5 10*3/uL (ref 4.0–10.5)
nRBC: 0 % (ref 0.0–0.2)

## 2019-06-14 LAB — LIPASE: Lipase: 38 U/L (ref 11.0–59.0)

## 2019-06-14 MED ORDER — METRONIDAZOLE 500 MG PO TABS: 500.0000 mg | ORAL_TABLET | Freq: Three times a day (TID) | ORAL | 0 refills | Status: DC

## 2019-06-14 MED ORDER — CIPROFLOXACIN HCL 500 MG PO TABS: 500.0000 mg | ORAL_TABLET | Freq: Two times a day (BID) | ORAL | 0 refills | Status: DC

## 2019-06-14 MED ORDER — SODIUM CHLORIDE 0.9% FLUSH: 3.0000 mL | Freq: Once | INTRAVENOUS | Status: DC

## 2019-06-14 NOTE — ED Triage Notes (Signed)
Pt states she was sent from PCP for abd pain that started on Sunday.pt also reports diarrhea.

## 2019-06-14 NOTE — Patient Instructions (Signed)

## 2019-06-14 NOTE — ED Notes (Signed)
Pt called for vital reassessment with no answer

## 2019-06-14 NOTE — Telephone Encounter (Signed)
Called Daughter, April Peggs, to inform her that Pt , mothers AVS was printed after she left and ABX was sent to [facility] . CRM will be created.

## 2019-06-14 NOTE — ED Notes (Signed)
Pt called for vitals reassessment x2 with no answer, pt not in lobby or restrooms.

## 2019-06-14 NOTE — Progress Notes (Addendum)
Linda [name] - 83 y.o. female MRN [id number]  Date of birth: 12-23-1935  Subjective Chief Complaint  Patient presents with  . Abdominal Pain    Onse 5 days, last solid BM 07/01, Loose BM Sat/Sun/Wed, pain RADs anterior to Posterior LSpine    HPI Linda [name] is a 83 y.o. female with history of diverticulosis, HTN and nicotine use here today with complaint of abdominal pain.  She reports that pain initially started on about 5 days ago.  Pain gradual onset with peak 2 days ago and now worsening again today.  She was unable to have a bowel movement until this morning.  Bowel movement today was loose.  She denies blood or dark stool.  She has had some mild nausea with this and decreased appetite.  She tried eating some soup 2 days ago but this seemed to make the pain worse.  She denies fever, chills, urinary symptoms, chest pain, palpitations, shortness of breath.    ROS:  A comprehensive ROS was completed and negative except as noted per HPI  No Known Allergies  Past Medical History:  Diagnosis Date  . Arthritis   . Breast cancer ([place])   . Constipation   . Diverticulosis of colon (without mention of hemorrhage)   . GERD (gastroesophageal reflux disease)   . Hypertension   . Nail fungus   . Uterine fibroid     Past Surgical History:  Procedure Laterality Date  . ABDOMINAL HYSTERECTOMY    . BREAST LUMPECTOMY Right 2004   radiation and chemo  . CHOLECYSTECTOMY  ~1980  . COLONOSCOPY     Hx; of  . ENDARTERECTOMY Right 01/19/2014   Procedure: ENDARTERECTOMY CAROTID;  Surgeon: Serafina Mitchell, MD;  Location: [facility] OR;  Service: Vascular;  Laterality: Right;  . Right lumpectomy with right sentinel lymph node dissection  2004   Dr. Bubba Camp  . VAGINAL HYSTERECTOMY      Social History   Socioeconomic History  . Marital status: Single    Spouse name: Not on file  . Number of children: 2  . Years of education: Not on file  . Highest education level: Not on file  Occupational History   . Occupation: Retired  Scientific laboratory technician  . Financial resource strain: Not on file  . Food insecurity    Worry: Not on file    Inability: Not on file  . Transportation needs    Medical: Not on file    Non-medical: Not on file  Tobacco Use  . Smoking status: Current Every Day Smoker    Packs/day: 1.00    Types: Cigarettes  . Smokeless tobacco: Never Used  . Tobacco comment: form given 12/14/11  Substance and Sexual Activity  . Alcohol use: No    Alcohol/week: 0.0 standard drinks  . Drug use: No  . Sexual activity: Never  Lifestyle  . Physical activity    Days per week: Not on file    Minutes per session: Not on file  . Stress: Not on file  Relationships  . Social Herbalist on phone: Not on file    Gets together: Not on file    Attends religious service: Not on file    Active member of club or organization: Not on file    Attends meetings of clubs or organizations: Not on file    Relationship status: Not on file  Other Topics Concern  . Not on file  Social History Narrative  . Not on file  Family History  Problem Relation Age of Onset  . Colon cancer Mother   . Cancer Mother        Kidney  . Pneumonia Father 26       Double  . Colon cancer Maternal Grandmother     Health Maintenance  Topic Date Due  . TETANUS/TDAP  01/14/1957  . PNA vac Low Risk Adult (2 of 2 - PCV13) 09/30/2015  . INFLUENZA VACCINE  07/08/2019  . DEXA SCAN  Completed    ----------------------------------------------------------------------------------------------------------------------------------------------------------------------------------------------------------------- Physical Exam BP 98/64   Pulse (!) 108   Temp 98 F (36.7 C)   Resp (!) 24   Wt 150 lb (68 kg)   SpO2 97%   BMI 25.75 kg/m   Physical Exam Constitutional:      Appearance: She is well-developed. She is not ill-appearing or toxic-appearing.  HENT:     Head: Normocephalic and atraumatic.   Cardiovascular:     Rate and Rhythm: Normal rate and regular rhythm.     Heart sounds: Normal heart sounds.  Pulmonary:     Effort: Pulmonary effort is normal.     Breath sounds: Normal breath sounds.  Abdominal:     General: Abdomen is flat. Bowel sounds are decreased. There is no distension.     Palpations: Abdomen is soft.     Tenderness: There is abdominal tenderness in the right lower quadrant and left lower quadrant. There is guarding. There is no right CVA tenderness, left CVA tenderness or rebound.  Skin:    General: Skin is warm and dry.     Findings: No rash.  Neurological:     Mental Status: She is alert.  Psychiatric:        Mood and Affect: Mood normal.     ------------------------------------------------------------------------------------------------------------------------------------------------------------------------------------------------------------------- Assessment and Plan  Lower abdominal pain -Suspect diverticulitis.  I have requested stat labs and CT scan.  I will cover empirically with cipro and flagyl.  She does have a history of PVD and is a smoker so mesenteric ischemia is in the differential as well however pain does not necessarily seem out of proportion to exam findings.  Will check lactic acid as well.   She is aware she may need to go to the ER if labs/imaging is abnormal and/or we are unable to obtain imaging today.   Orders Placed This Encounter  Procedures  . CT Abdomen Pelvis W Contrast    Standing Status:   Future    Standing Expiration Date:   09/13/2020    Order Specific Question:   If indicated for the ordered procedure, I authorize the administration of contrast media per Radiology protocol    Answer:   Yes    Order Specific Question:   Preferred imaging location?    Answer:   [id number]    Order Specific Question:   Is Oral Contrast requested for this exam?    Answer:   Yes, Per Radiology protocol    Order Specific  Question:   Radiology Contrast Protocol - do NOT remove file path    Answer:   [email or web address]  . CBC w/Diff  . Comp Met (CMET)  . Lactic acid, plasma  . Lipase  . POCT Urinalysis Dipstick   Addendum:  Spoke with patient about lab results.  Labs show AKI with elevated Scr and potassium, likely 2/2 to poor po intake.  Recommend she be seen in ED asap for fluids and have imaging completed there.  She expresses understanding and reports she will  go to [facility] ED.  Will have [employer] imaging to let them know she will not be coming for imaging scheduled for this afternoon.

## 2019-06-14 NOTE — Assessment & Plan Note (Addendum)
-  Suspect diverticulitis.  I have requested stat labs and CT scan.  I will cover empirically with cipro and flagyl.  She does have a history of PVD and is a smoker so mesenteric ischemia is in the differential as well however pain does not necessarily seem out of proportion to exam findings.  Will check lactic acid as well.   She is aware she may need to go to the ER if labs/imaging is abnormal and/or we are unable to obtain imaging today.

## 2019-06-15 ENCOUNTER — Inpatient Hospital Stay (HOSPITAL_COMMUNITY)
Admission: EM | Admit: 2019-06-15 | Discharge: 2019-06-20 | DRG: 682 | Disposition: A | Discharge status: Home / Self Care | Payer: Medicare Other | Attending: Internal Medicine | Admitting: Internal Medicine

## 2019-06-15 ENCOUNTER — Emergency Department (HOSPITAL_COMMUNITY): Payer: Medicare Other

## 2019-06-15 ENCOUNTER — Encounter (HOSPITAL_COMMUNITY): Payer: Self-pay

## 2019-06-15 ENCOUNTER — Other Ambulatory Visit: Payer: Self-pay

## 2019-06-15 DIAGNOSIS — I700 Atherosclerosis of aorta: Secondary | ICD-10-CM | POA: Diagnosis present

## 2019-06-15 DIAGNOSIS — R109 Unspecified abdominal pain: Secondary | ICD-10-CM | POA: Diagnosis present

## 2019-06-15 DIAGNOSIS — I2510 Atherosclerotic heart disease of native coronary artery without angina pectoris: Secondary | ICD-10-CM | POA: Diagnosis present

## 2019-06-15 DIAGNOSIS — Z79899 Other long term (current) drug therapy: Secondary | ICD-10-CM

## 2019-06-15 DIAGNOSIS — F17210 Nicotine dependence, cigarettes, uncomplicated: Secondary | ICD-10-CM | POA: Diagnosis present

## 2019-06-15 DIAGNOSIS — Z800 Family history of malignant neoplasm of digestive organs: Secondary | ICD-10-CM

## 2019-06-15 DIAGNOSIS — Z7982 Long term (current) use of aspirin: Secondary | ICD-10-CM

## 2019-06-15 DIAGNOSIS — I70209 Unspecified atherosclerosis of native arteries of extremities, unspecified extremity: Secondary | ICD-10-CM | POA: Diagnosis present

## 2019-06-15 DIAGNOSIS — R059 Cough, unspecified: Secondary | ICD-10-CM

## 2019-06-15 DIAGNOSIS — Z9221 Personal history of antineoplastic chemotherapy: Secondary | ICD-10-CM

## 2019-06-15 DIAGNOSIS — R1030 Lower abdominal pain, unspecified: Secondary | ICD-10-CM | POA: Diagnosis not present

## 2019-06-15 DIAGNOSIS — R8271 Bacteriuria: Secondary | ICD-10-CM | POA: Diagnosis present

## 2019-06-15 DIAGNOSIS — U071 COVID-19: Secondary | ICD-10-CM | POA: Diagnosis present

## 2019-06-15 DIAGNOSIS — Z9049 Acquired absence of other specified parts of digestive tract: Secondary | ICD-10-CM

## 2019-06-15 DIAGNOSIS — N179 Acute kidney failure, unspecified: Secondary | ICD-10-CM

## 2019-06-15 DIAGNOSIS — I779 Disorder of arteries and arterioles, unspecified: Secondary | ICD-10-CM | POA: Diagnosis present

## 2019-06-15 DIAGNOSIS — K921 Melena: Secondary | ICD-10-CM | POA: Diagnosis present

## 2019-06-15 DIAGNOSIS — R05 Cough: Secondary | ICD-10-CM

## 2019-06-15 DIAGNOSIS — Z66 Do not resuscitate: Secondary | ICD-10-CM | POA: Diagnosis present

## 2019-06-15 DIAGNOSIS — I998 Other disorder of circulatory system: Secondary | ICD-10-CM | POA: Diagnosis present

## 2019-06-15 DIAGNOSIS — M48061 Spinal stenosis, lumbar region without neurogenic claudication: Secondary | ICD-10-CM | POA: Diagnosis present

## 2019-06-15 DIAGNOSIS — N2889 Other specified disorders of kidney and ureter: Secondary | ICD-10-CM | POA: Diagnosis present

## 2019-06-15 DIAGNOSIS — K922 Gastrointestinal hemorrhage, unspecified: Secondary | ICD-10-CM

## 2019-06-15 DIAGNOSIS — Z923 Personal history of irradiation: Secondary | ICD-10-CM

## 2019-06-15 DIAGNOSIS — D696 Thrombocytopenia, unspecified: Secondary | ICD-10-CM | POA: Diagnosis present

## 2019-06-15 DIAGNOSIS — M1990 Unspecified osteoarthritis, unspecified site: Secondary | ICD-10-CM | POA: Diagnosis present

## 2019-06-15 DIAGNOSIS — E875 Hyperkalemia: Secondary | ICD-10-CM | POA: Diagnosis not present

## 2019-06-15 DIAGNOSIS — J449 Chronic obstructive pulmonary disease, unspecified: Secondary | ICD-10-CM | POA: Diagnosis present

## 2019-06-15 DIAGNOSIS — R8281 Pyuria: Secondary | ICD-10-CM | POA: Diagnosis present

## 2019-06-15 DIAGNOSIS — Z853 Personal history of malignant neoplasm of breast: Secondary | ICD-10-CM

## 2019-06-15 DIAGNOSIS — K219 Gastro-esophageal reflux disease without esophagitis: Secondary | ICD-10-CM | POA: Diagnosis not present

## 2019-06-15 DIAGNOSIS — I959 Hypotension, unspecified: Secondary | ICD-10-CM | POA: Diagnosis not present

## 2019-06-15 DIAGNOSIS — K5730 Diverticulosis of large intestine without perforation or abscess without bleeding: Secondary | ICD-10-CM | POA: Diagnosis present

## 2019-06-15 DIAGNOSIS — I10 Essential (primary) hypertension: Secondary | ICD-10-CM | POA: Diagnosis present

## 2019-06-15 DIAGNOSIS — Z90710 Acquired absence of both cervix and uterus: Secondary | ICD-10-CM

## 2019-06-15 HISTORY — DX: Acute kidney failure, unspecified: N17.9

## 2019-06-15 LAB — COMPREHENSIVE METABOLIC PANEL
ALT: 13 U/L (ref 0–44)
AST: 25 U/L (ref 15–41)
Albumin: 3.5 g/dL (ref 3.5–5.0)
Alkaline Phosphatase: 87 U/L (ref 38–126)
Anion gap: 12 (ref 5–15)
BUN: 35 mg/dL — ABNORMAL HIGH (ref 8–23)
CO2: 19 mmol/L — ABNORMAL LOW (ref 22–32)
Calcium: 8.7 mg/dL — ABNORMAL LOW (ref 8.9–10.3)
Chloride: 107 mmol/L (ref 98–111)
Creatinine, Ser: 2.14 mg/dL — ABNORMAL HIGH (ref 0.44–1.00)
GFR calc Af Amer: 24 mL/min — ABNORMAL LOW (ref >60)
GFR calc non Af Amer: 21 mL/min — ABNORMAL LOW (ref >60)
Glucose, Bld: 92 mg/dL (ref 70–99)
Potassium: 4.3 mmol/L (ref 3.5–5.1)
Sodium: 138 mmol/L (ref 135–145)
Total Bilirubin: 0.4 mg/dL (ref 0.3–1.2)
Total Protein: 7.6 g/dL (ref 6.5–8.1)

## 2019-06-15 LAB — CBC WITH DIFFERENTIAL/PLATELET
Abs Immature Granulocytes: 0.01 10*3/uL (ref 0.00–0.07)
Basophils Absolute: 0 10*3/uL (ref 0.0–0.1)
Basophils Relative: 0 %
Eosinophils Absolute: 0 10*3/uL (ref 0.0–0.5)
Eosinophils Relative: 0 %
HCT: 43.5 % (ref 36.0–46.0)
Hemoglobin: 13.6 g/dL (ref 12.0–15.0)
Immature Granulocytes: 0 %
Lymphocytes Relative: 27 %
Lymphs Abs: 1.4 10*3/uL (ref 0.7–4.0)
MCH: 26.4 pg (ref 26.0–34.0)
MCHC: 31.3 g/dL (ref 30.0–36.0)
MCV: 84.3 fL (ref 80.0–100.0)
Monocytes Absolute: 0.7 10*3/uL (ref 0.1–1.0)
Monocytes Relative: 13 %
Neutro Abs: 3 10*3/uL (ref 1.7–7.7)
Neutrophils Relative %: 60 %
Platelets: 149 10*3/uL — ABNORMAL LOW (ref 150–400)
RBC: 5.16 MIL/uL — ABNORMAL HIGH (ref 3.87–5.11)
RDW: 13.3 % (ref 11.5–15.5)
WBC: 5.1 10*3/uL (ref 4.0–10.5)
nRBC: 0 % (ref 0.0–0.2)

## 2019-06-15 LAB — URINALYSIS, ROUTINE W REFLEX MICROSCOPIC
Bilirubin Urine: NEGATIVE
Glucose, UA: NEGATIVE mg/dL
Ketones, ur: NEGATIVE mg/dL
Nitrite: NEGATIVE
Protein, ur: 100 mg/dL — AB
Specific Gravity, Urine: 1.013 (ref 1.005–1.030)
pH: 5 (ref 5.0–8.0)

## 2019-06-15 LAB — POC OCCULT BLOOD, ED: Fecal Occult Bld: POSITIVE — AB

## 2019-06-15 LAB — MRSA PCR SCREENING: MRSA by PCR: NEGATIVE

## 2019-06-15 LAB — PROCALCITONIN: Procalcitonin: <0.1 ng/mL

## 2019-06-15 LAB — SARS CORONAVIRUS 2 BY RT PCR (HOSPITAL ORDER, PERFORMED IN ~~LOC~~ HOSPITAL LAB): SARS Coronavirus 2: POSITIVE — AB

## 2019-06-15 LAB — LIPASE, BLOOD: Lipase: 39 U/L (ref 11–51)

## 2019-06-15 LAB — LACTIC ACID, PLASMA: Lactic Acid, Venous: 1.2 mmol/L (ref 0.5–1.9)

## 2019-06-15 MED ORDER — SODIUM CHLORIDE 0.9 % IV SOLN
INTRAVENOUS | Status: DC
  Administered 2019-06-15 – 2019-06-17 (×3): via INTRAVENOUS

## 2019-06-15 MED ORDER — POLYETHYLENE GLYCOL 3350 17 G PO PACK: 17.0000 g | PACK | Freq: Every day | ORAL | Status: DC | PRN

## 2019-06-15 MED ORDER — ONDANSETRON HCL 4 MG/2ML IJ SOLN
2.0000 mg | Freq: Once | INTRAMUSCULAR | Status: AC
  Administered 2019-06-15: 2 mg via INTRAVENOUS
  Filled 2019-06-15: qty 2

## 2019-06-15 MED ORDER — ACETAMINOPHEN 325 MG PO TABS
650.0000 mg | ORAL_TABLET | Freq: Four times a day (QID) | ORAL | Status: DC | PRN
  Filled 2019-06-15: qty 2

## 2019-06-15 MED ORDER — GABAPENTIN 300 MG PO CAPS
300.0000 mg | ORAL_CAPSULE | Freq: Every day | ORAL | Status: DC
  Administered 2019-06-16 – 2019-06-20 (×5): 300 mg via ORAL
  Filled 2019-06-15 (×6): qty 1

## 2019-06-15 MED ORDER — PANTOPRAZOLE SODIUM 40 MG PO TBEC
40.0000 mg | DELAYED_RELEASE_TABLET | Freq: Every day | ORAL | Status: DC
  Administered 2019-06-15 – 2019-06-20 (×6): 40 mg via ORAL
  Filled 2019-06-15 (×6): qty 1

## 2019-06-15 MED ORDER — SODIUM CHLORIDE 0.9 % IV BOLUS
1000.0000 mL | Freq: Once | INTRAVENOUS | Status: AC
  Administered 2019-06-15: 1000 mL via INTRAVENOUS

## 2019-06-15 MED ORDER — ATORVASTATIN CALCIUM 40 MG PO TABS
40.0000 mg | ORAL_TABLET | Freq: Every day | ORAL | Status: DC
  Administered 2019-06-16 – 2019-06-19 (×4): 40 mg via ORAL
  Filled 2019-06-15 (×5): qty 1

## 2019-06-15 MED ORDER — VERAPAMIL HCL ER 120 MG PO TBCR
240.0000 mg | EXTENDED_RELEASE_TABLET | Freq: Every day | ORAL | Status: DC
  Administered 2019-06-15 – 2019-06-17 (×3): 240 mg via ORAL
  Filled 2019-06-15 (×4): qty 2

## 2019-06-15 MED ORDER — ONDANSETRON HCL 4 MG PO TABS: 4.0000 mg | ORAL_TABLET | Freq: Four times a day (QID) | ORAL | Status: DC | PRN

## 2019-06-15 MED ORDER — ACETAMINOPHEN 650 MG RE SUPP: 650.0000 mg | Freq: Four times a day (QID) | RECTAL | Status: DC | PRN

## 2019-06-15 MED ORDER — HYDROCODONE-ACETAMINOPHEN 5-325 MG PO TABS
1.0000 | ORAL_TABLET | ORAL | Status: DC | PRN
  Administered 2019-06-16 – 2019-06-19 (×3): 2 via ORAL
  Filled 2019-06-15 (×3): qty 2

## 2019-06-15 MED ORDER — CLONIDINE HCL 0.1 MG PO TABS
0.2000 mg | ORAL_TABLET | Freq: Two times a day (BID) | ORAL | Status: DC
  Administered 2019-06-15 – 2019-06-17 (×5): 0.2 mg via ORAL
  Filled 2019-06-15 (×2): qty 2
  Filled 2019-06-15: qty 1
  Filled 2019-06-15: qty 2
  Filled 2019-06-15: qty 1

## 2019-06-15 MED ORDER — MORPHINE SULFATE (PF) 2 MG/ML IV SOLN
2.0000 mg | Freq: Once | INTRAVENOUS | Status: AC
  Administered 2019-06-15: 0.25 mg via INTRAVENOUS
  Filled 2019-06-15: qty 1

## 2019-06-15 MED ORDER — ONDANSETRON HCL 4 MG/2ML IJ SOLN
4.0000 mg | Freq: Four times a day (QID) | INTRAMUSCULAR | Status: DC | PRN
  Administered 2019-06-16: 4 mg via INTRAVENOUS
  Filled 2019-06-15: qty 2

## 2019-06-15 MED ORDER — SODIUM CHLORIDE 0.9 % IV BOLUS: 1000.0000 mL | Freq: Once | INTRAVENOUS | Status: DC

## 2019-06-15 MED ORDER — HYDRALAZINE HCL 50 MG PO TABS
25.0000 mg | ORAL_TABLET | Freq: Four times a day (QID) | ORAL | Status: DC
  Administered 2019-06-15 – 2019-06-17 (×9): 25 mg via ORAL
  Filled 2019-06-15 (×10): qty 1

## 2019-06-15 NOTE — ED Notes (Signed)
Attempted top call report to the floor.

## 2019-06-15 NOTE — ED Triage Notes (Signed)
Pt back todayy for elevated kidney function , pt was here in the Ed but left

## 2019-06-15 NOTE — Progress Notes (Signed)
Pt Covid -19 test came back positive. No SOb noted. Md notified, and order received to transfer pt to [facility]W. Informed pt of the said transfer. Report given to receiving RN. Pt's daughter April is aware.

## 2019-06-15 NOTE — H&P (Signed)
History and Physical    PINKY RAVAN UXL:[id number] DOB: [birth year] DOA: 06/15/2019  PCP: Milus Banister, MD Patient coming from: Home  Chief Complaint: Abdominal pain  HPI: Linda [name] is a 83 y.o. female with medical history significant of coronary artery disease, diverticulosis, pancreatitis, GERD, hypertension.  Patient resented secondary to a 5 day history of abdominal pain.  Pain is mostly in the left lower quadrant.  She describes it as achy and persistent pain.  She has not taken anything to help with the pain but medications given in the ED have helped with her pain.  No reported sick contacts.  Afebrile at home.  She states that she has had some dark stools but no melena.  ED Course: Vitals: T-max of 100.2 F, pulse of 97, respirations 20, BP elevated with last validated BP of 190/91 Labs: CO2 of 19, creatinine of 2.14, calcium of 8.7, hemoglobin of 13.6, platelets of 139 Imaging: CT abdomen/pelvis significant for diverticulosis, left kidney mass, pancreatic calcification, severe spinal stenosis at L3-4 Medications/Course: Morphine, Zofran, 2L NS bolus  Review of Systems: Review of Systems  Constitutional: Negative for chills, fever and malaise/fatigue.  Respiratory: Negative for cough and shortness of breath.   Cardiovascular: Negative for chest pain and palpitations.  Gastrointestinal: Positive for abdominal pain. Negative for blood in stool, constipation, diarrhea, melena, nausea and vomiting.  Genitourinary: Negative for dysuria, frequency and hematuria.  All other systems reviewed and are negative.   Past Medical History:  Diagnosis Date  . Arthritis   . Breast cancer ([name])   . Constipation   . Diverticulosis of colon (without mention of hemorrhage)   . GERD (gastroesophageal reflux disease)   . Hypertension   . Nail fungus   . Uterine fibroid     Past Surgical History:  Procedure Laterality Date  . ABDOMINAL HYSTERECTOMY    . BREAST LUMPECTOMY Right 2004    radiation and chemo  . CHOLECYSTECTOMY  ~1980  . COLONOSCOPY     Hx; of  . ENDARTERECTOMY Right 01/19/2014   Procedure: ENDARTERECTOMY CAROTID;  Surgeon: Serafina Mitchell, MD;  Location: [facility] OR;  Service: Vascular;  Laterality: Right;  . Right lumpectomy with right sentinel lymph node dissection  2004   Dr. Bubba Camp  . VAGINAL HYSTERECTOMY       reports that she has been smoking cigarettes. She has been smoking about 1.00 pack per day. She has never used smokeless tobacco. She reports that she does not drink alcohol or use drugs.  No Known Allergies  Family History  Problem Relation Age of Onset  . Colon cancer Mother   . Cancer Mother        Kidney  . Pneumonia Father 76       Double  . Colon cancer Maternal Grandmother    Prior to Admission medications   Medication Sig Start Date End Date Taking? Authorizing Provider  aspirin EC 81 MG tablet Take 1 tablet (81 mg total) by mouth daily. 02/24/12  Yes [name], MD  atorvastatin (LIPITOR) 40 MG tablet TAKE ONE TABLET BY MOUTH EACH EVENING Patient taking differently: Take 40 mg by mouth daily at 6 PM.  05/03/19  Yes Luetta Nutting, DO  cloNIDine (CATAPRES) 0.2 MG tablet TAKE 1 TABLET BY MOUTH TWICE A DAY Patient taking differently: Take 0.2 mg by mouth 2 (two) times daily.  04/06/19  Yes Matthews, Cody, DO  DEXILANT 60 MG capsule TAKE 1 CAPSULE BY MOUTH EVERY DAY Patient taking differently: Take 60  mg by mouth daily.  02/05/19  Yes Luetta Nutting, DO  gabapentin (NEURONTIN) 300 MG capsule TAKE 1 CAPSULE BY MOUTH EVERY DAY Patient taking differently: Take 300 mg by mouth daily.  04/13/19  Yes Luetta Nutting, DO  hydrALAZINE (APRESOLINE) 25 MG tablet TAKE 1 TABLET BY MOUTH FOUR TIMES A DAY Patient taking differently: Take 25 mg by mouth 4 (four) times daily.  07/13/18  Yes Luetta Nutting, DO  verapamil (VERELAN PM) 120 MG 24 hr capsule TAKE 2 CAPSULES EVERY EVENING Patient taking differently: Take 240 mg by mouth at bedtime.  03/14/19  Yes  Luetta Nutting, DO  ciprofloxacin (CIPRO) 500 MG tablet Take 1 tablet (500 mg total) by mouth 2 (two) times daily. Patient not taking: Reported on 06/15/2019 06/14/19   Luetta Nutting, DO  metroNIDAZOLE (FLAGYL) 500 MG tablet Take 1 tablet (500 mg total) by mouth 3 (three) times daily. Patient not taking: Reported on 06/15/2019 06/14/19   Luetta Nutting, DO  omeprazole (PRILOSEC) 20 MG capsule Take 1 capsule (20 mg total) by mouth daily. 12/10/11 02/24/12  Lisbeth Renshaw, PA-C    Physical Exam:  Physical Exam Vitals signs reviewed.  Constitutional:      General: She is not in acute distress.    Appearance: She is well-developed. She is not diaphoretic.  HENT:     Mouth/Throat:     Mouth: Mucous membranes are dry.  Eyes:     Conjunctiva/sclera: Conjunctivae normal.     Pupils: Pupils are equal, round, and reactive to light.  Neck:     Musculoskeletal: Normal range of motion.  Cardiovascular:     Rate and Rhythm: Regular rhythm. Tachycardia present.     Heart sounds: Murmur present.  Pulmonary:     Effort: Pulmonary effort is normal. No respiratory distress.     Breath sounds: Normal breath sounds. No wheezing or rales.  Abdominal:     General: Bowel sounds are normal. There is no distension.     Palpations: Abdomen is soft.     Tenderness: There is no abdominal tenderness. There is no guarding or rebound.  Musculoskeletal: Normal range of motion.        General: No tenderness.  Skin:    General: Skin is warm and dry.  Neurological:     Mental Status: She is alert and oriented to person, place, and time.  Psychiatric:        Mood and Affect: Mood normal.        Behavior: Behavior normal.        Thought Content: Thought content normal.      Labs on Admission: I have personally reviewed following labs and imaging studies  CBC: Recent Labs  Lab 06/14/19 1057 06/14/19 1439 06/15/19 1009  WBC 4.8 4.5 5.1  NEUTROABS 2.6  --  3.0  HGB 14.1 13.5 13.6  HCT 43.8 43.8 43.5  MCV  82.2 84.7 84.3  PLT 161.0 165 149*    Basic Metabolic Panel: Recent Labs  Lab 06/14/19 1057 06/14/19 1439 06/15/19 1009  NA 141 139 138  K 5.4 No hemolysis seen* 4.5 4.3  CL 107 106 107  CO2 21 20* 19*  GLUCOSE 92 89 92  BUN 26* 27* 35*  CREATININE 1.71* 2.12* 2.14*  CALCIUM 8.9 8.7* 8.7*    GFR: Estimated Creatinine Clearance: 19.5 mL/min (A) (by C-G formula based on SCr of 2.14 mg/dL (H)).  Liver Function Tests: Recent Labs  Lab 06/14/19 1057 06/14/19 1439 06/15/19 1009  AST 20 25 25  ALT 10 13 13   ALKPHOS 100 88 87  BILITOT 0.4 0.7 0.4  PROT 7.5 7.6 7.6  ALBUMIN 4.0 3.5 3.5   Recent Labs  Lab 06/14/19 1057 06/14/19 1439 06/15/19 1009  LIPASE 38.0 35 39   No results for input(s): AMMONIA in the last 168 hours.  Coagulation Profile: No results for input(s): INR, PROTIME in the last 168 hours.  Cardiac Enzymes: No results for input(s): CKTOTAL, CKMB, CKMBINDEX, TROPONINI in the last 168 hours.  BNP (last 3 results) No results for input(s): PROBNP in the last 8760 hours.  HbA1C: No results for input(s): HGBA1C in the last 72 hours.  CBG: No results for input(s): GLUCAP in the last 168 hours.  Lipid Profile: No results for input(s): CHOL, HDL, LDLCALC, TRIG, CHOLHDL, LDLDIRECT in the last 72 hours.  Thyroid Function Tests: No results for input(s): TSH, T4TOTAL, FREET4, T3FREE, THYROIDAB in the last 72 hours.  Anemia Panel: No results for input(s): VITAMINB12, FOLATE, FERRITIN, TIBC, IRON, RETICCTPCT in the last 72 hours.  Urine analysis:    Component Value Date/Time   COLORURINE YELLOW 06/15/2019 1310   APPEARANCEUR CLEAR 06/15/2019 1310   LABSPEC 1.013 06/15/2019 1310   PHURINE 5.0 06/15/2019 1310   GLUCOSEU NEGATIVE 06/15/2019 1310   HGBUR MODERATE (A) 06/15/2019 1310   BILIRUBINUR NEGATIVE 06/15/2019 1310   BILIRUBINUR NEGATIVE 06/14/2019 1232   KETONESUR NEGATIVE 06/15/2019 1310   PROTEINUR 100 (A) 06/15/2019 1310   UROBILINOGEN 0.2  06/14/2019 1232   UROBILINOGEN 1.0 09/27/2014 2044   NITRITE NEGATIVE 06/15/2019 1310   LEUKOCYTESUR LARGE (A) 06/15/2019 1310     Radiological Exams on Admission: Ct Abdomen Pelvis Wo Contrast  Result Date: 06/15/2019 CLINICAL DATA:  Acute onset generalized abdominal pain. Acute renal insufficiency precludes IV contrast. Personal history of breast cancer post malignant RIGHT lumpectomy in 2004. Surgical history also includes hysterectomy, cholecystectomy. EXAM: CT ABDOMEN AND PELVIS WITHOUT CONTRAST TECHNIQUE: Multidetector CT imaging of the abdomen and pelvis was performed following the standard protocol without IV contrast. COMPARISON:  09/27/2014 and earlier. FINDINGS: Lower chest: Mild dependent atelectasis in the lower lobes. Visualized lung bases otherwise clear. Heart mildly to moderately enlarged. Mitral and aortic annular calcification. RIGHT coronary artery atherosclerosis. Hepatobiliary: Normal unenhanced appearance of the liver. Surgically absent gallbladder. No biliary ductal dilation. Pancreas: Approximate 3 mm calcification in the distal body of the pancreas. Approximate 1.7 cm cystic lesion in the proximal tail of the pancreas (series 3, image 20). Remainder of the pancreas normal in appearance. Spleen: Normal unenhanced appearance. Adrenals/Urinary Tract: Mild LEFT adrenal hyperplasia, unchanged from the prior CT. Normal appearing RIGHT adrenal gland. Multiple BILATERAL renal cysts, the largest arising from the LOWER pole of the LEFT kidney measuring approximately 4.5 cm. Indeterminate exophytic mass arising from POSTERIOR mid LEFT kidney, not present on the prior CT. No hydronephrosis. No urinary tract calculi; intrarenal arterial calcifications mimic calculi. Normal appearing urinary bladder. Stomach/Bowel: Stomach normal in appearance for the degree of distention. Normal-appearing small bowel. Entire colon decompressed. Diffuse colonic diverticulosis without evidence of acute  diverticulitis. Normal appendix in the RIGHT UPPER pelvis, filled with oral contrast. Vascular/Lymphatic: Severe a aorto-iliofemoral atherosclerosis without evidence of aneurysm. No pathologic lymphadenopathy. Reproductive: Surgically absent uterus. No adnexal masses. Other: Moderate-sized umbilical hernia containing fat. Musculoskeletal: Moderate to severe degenerative changes in both hips and in both sacroiliac joints. Multilevel degenerative disc disease, spondylosis and severe facet degenerative changes throughout the lumbar spine. Osseous demineralization. Severe multifactorial spinal stenosis at L3-4. No acute findings. IMPRESSION: 1.  No acute abnormalities involving the abdomen or pelvis. 2. Diffuse colonic diverticulosis without evidence of acute diverticulitis. 3. Indeterminate exophytic mass arising from the mid LEFT kidney, not present on the prior CT. While this may represent a hyperdense cyst, it measures approximately 40 Hounsfield units which is the same as the normal unenhanced renal parenchyma, and therefore renal cell carcinoma is not excluded. Since the patient has acute renal insufficiency, MRI contrast should not be administered currently; initially, renal ultrasound may be useful in follow-up. 4. Approximate 3 mm calcification in the distal body of the pancreas, likely related to prior pancreatitis. 5. Severe multifactorial spinal stenosis at L3-4. Electronically Signed   By: Evangeline Dakin M.D.   On: 06/15/2019 15:28    EKG: None obtained  Assessment/Plan Active Problems:   AKI (acute kidney injury) ([place])   Abdominal pain Unclear etiology. Vague symptoms. Exam unremarkable. No hematochezia, nausea, vomiting or diarrhea. No diverticulitis on CT abdomen/pelvis. Patient with elevated temperature without fever. Urine culture suggests possible infection; no symptoms. No sick contacts. -Observe -Blood culture x2 pending, obtain urine culture, procalcitonin -Hold antibiotics for  now  Acute kidney injury Baseline creatinine appears to be around 0.77-0.94. Creatinine of 2.12 yesterday and 2.14 today on admission. No hydronephrosis noted on CT scan -IV fluids overnight -A.m. BMP  Hemoccult positive stool Patient states she has not notice melena but just one episode of dark stool. She has a history of diverticulosis. Hemoccult via DRE is positive. Hemoglobin is normal and stable. -CBC in AM -GI consulted  Tachycardia Appears sinus rhythm on telemetry. -EKG  Essential pretension Uncontrolled likely secondary to needing on medications. -Resume home clonidine, hydralazine, verapamil  Hyperlipidemia -Continue Lipitor  Renal mass Incidental finding.  Will need outpatient follow-up.  There is a slight concern for renal carcinoma.  Carotid artery disease -Hold aspirin in setting of possible GI bleed   DVT prophylaxis: SCDs Code Status: DNR Family Communication: Called daughter with no response Disposition Plan: Medical floor Consults called:  GI Admission status: Observation   Cordelia Poche, MD [facility] 06/15/2019, 5:04 PM

## 2019-06-15 NOTE — ED Provider Notes (Addendum)
[facility] EMERGENCY DEPARTMENT Provider Note   CSN: [id number] Arrival date & time: 06/15/19  4709    History   Chief Complaint Chief Complaint  Patient presents with  . Abnormal Lab    HPI Linda [name] is a 83 y.o. female with history of breast cancer, GERD, diverticulosis, hypertension: Hysterectomy, cholecystectomy presents today for abdominal pain.  Patient reports moderate intensity lower abdominal pain x4 days constant worsened with movement and palpation without alleviating factors, nonradiating.  Patient reports her only associated symptom as dark stool for the past several days which is new for her.  Denies fever/chills, nausea/vomiting, diarrhea, chest pain/shortness of breath, cough/hemoptysis, headache or any additional concerns.     HPI  Past Medical History:  Diagnosis Date  . Arthritis   . Breast cancer ([place])   . Constipation   . Diverticulosis of colon (without mention of hemorrhage)   . GERD (gastroesophageal reflux disease)   . Hypertension   . Nail fungus   . Uterine fibroid     Patient Active Problem List   Diagnosis Date Noted  . Lower abdominal pain 06/14/2019  . Post herpetic neuralgia 11/22/2018  . Cardiac murmur 05/16/2018  . GERD (gastroesophageal reflux disease) 05/16/2018  . Elevated lipase 09/28/2014  . Hypokalemia 09/28/2014  . Tobacco abuse 09/28/2014  . Pancreatitis, acute 09/28/2014  . Carotid artery disease ([place]) 12/18/2013  . Peripheral vascular disease ([place]) 02/24/2012  . Malignant neoplasm of female breast ([place]) 04/23/2008  . Essential hypertension 04/23/2008  . DIVERTICULOSIS OF COLON 04/23/2008  . CONSTIPATION, CHRONIC 04/23/2008    Past Surgical History:  Procedure Laterality Date  . ABDOMINAL HYSTERECTOMY    . BREAST LUMPECTOMY Right 2004   radiation and chemo  . CHOLECYSTECTOMY  ~1980  . COLONOSCOPY     Hx; of  . ENDARTERECTOMY Right 01/19/2014   Procedure: ENDARTERECTOMY CAROTID;  Surgeon:  Serafina Mitchell, MD;  Location: [facility] OR;  Service: Vascular;  Laterality: Right;  . Right lumpectomy with right sentinel lymph node dissection  2004   Dr. Bubba Camp  . VAGINAL HYSTERECTOMY       OB History   No obstetric history on file.      Home Medications    Prior to Admission medications   Medication Sig Start Date End Date Taking? Authorizing Provider  aspirin EC 81 MG tablet Take 1 tablet (81 mg total) by mouth daily. 02/24/12  Yes [name], MD  atorvastatin (LIPITOR) 40 MG tablet TAKE ONE TABLET BY MOUTH EACH EVENING Patient taking differently: Take 40 mg by mouth daily at 6 PM.  05/03/19  Yes Luetta Nutting, DO  cloNIDine (CATAPRES) 0.2 MG tablet TAKE 1 TABLET BY MOUTH TWICE A DAY Patient taking differently: Take 0.2 mg by mouth 2 (two) times daily.  04/06/19  Yes Matthews, Cody, DO  DEXILANT 60 MG capsule TAKE 1 CAPSULE BY MOUTH EVERY DAY Patient taking differently: Take 60 mg by mouth daily.  02/05/19  Yes Luetta Nutting, DO  gabapentin (NEURONTIN) 300 MG capsule TAKE 1 CAPSULE BY MOUTH EVERY DAY Patient taking differently: Take 300 mg by mouth daily.  04/13/19  Yes Luetta Nutting, DO  hydrALAZINE (APRESOLINE) 25 MG tablet TAKE 1 TABLET BY MOUTH FOUR TIMES A DAY Patient taking differently: Take 25 mg by mouth 4 (four) times daily.  07/13/18  Yes Luetta Nutting, DO  verapamil (VERELAN PM) 120 MG 24 hr capsule TAKE 2 CAPSULES EVERY EVENING Patient taking differently: Take 240 mg by mouth at bedtime.  03/14/19  Yes Luetta Nutting, DO  ciprofloxacin (CIPRO) 500 MG tablet Take 1 tablet (500 mg total) by mouth 2 (two) times daily. Patient not taking: Reported on 06/15/2019 06/14/19   Luetta Nutting, DO  metroNIDAZOLE (FLAGYL) 500 MG tablet Take 1 tablet (500 mg total) by mouth 3 (three) times daily. Patient not taking: Reported on 06/15/2019 06/14/19   Luetta Nutting, DO  omeprazole (PRILOSEC) 20 MG capsule Take 1 capsule (20 mg total) by mouth daily. 12/10/11 02/24/12  Lisbeth Renshaw, PA-C     Family History Family History  Problem Relation Age of Onset  . Colon cancer Mother   . Cancer Mother        Kidney  . Pneumonia Father 73       Double  . Colon cancer Maternal Grandmother     Social History Social History   Tobacco Use  . Smoking status: Current Every Day Smoker    Packs/day: 1.00    Types: Cigarettes  . Smokeless tobacco: Never Used  . Tobacco comment: form given 12/14/11  Substance Use Topics  . Alcohol use: No    Alcohol/week: 0.0 standard drinks  . Drug use: No     Allergies   Patient has no known allergies.   Review of Systems Review of Systems  Constitutional: Negative.  Negative for chills and fever.  Respiratory: Negative.  Negative for cough and shortness of breath.   Cardiovascular: Negative.  Negative for chest pain.  Gastrointestinal: Positive for abdominal pain, blood in stool (Melena) and diarrhea. Negative for nausea and vomiting.  Genitourinary: Negative.  Negative for dysuria, hematuria and vaginal bleeding.  All other systems reviewed and are negative.  Physical Exam Updated Vital Signs BP (!) 190/91   Pulse (!) 107   Temp 100.2 F (37.9 C) (Rectal)   Resp 18   SpO2 93%   Physical Exam Constitutional:      General: She is not in acute distress.    Appearance: Normal appearance. She is well-developed. She is not ill-appearing or diaphoretic.     Comments: Frail-appearing  HENT:     Head: Normocephalic and atraumatic.     Right Ear: External ear normal.     Left Ear: External ear normal.     Nose: Nose normal.  Eyes:     General: Vision grossly intact. Gaze aligned appropriately.     Pupils: Pupils are equal, round, and reactive to light.  Neck:     Musculoskeletal: Normal range of motion.     Trachea: Trachea and phonation normal. No tracheal deviation.  Cardiovascular:     Rate and Rhythm: Normal rate and regular rhythm.     Pulses: Normal pulses.     Heart sounds: Normal heart sounds.  Pulmonary:      Effort: Pulmonary effort is normal. No respiratory distress.     Breath sounds: Normal breath sounds.  Abdominal:     General: There is no distension.     Palpations: Abdomen is soft.     Tenderness: There is no abdominal tenderness. There is no guarding or rebound.  Genitourinary:    Comments: Rectal examination chaperoned by [place] RN.  No visible or palpable hemorrhoids/fissures.  Normal rectal tone.  Light brown stool without gross blood.  Musculoskeletal: Normal range of motion.        General: No deformity or signs of injury.  Skin:    General: Skin is warm and dry.  Neurological:     Mental Status: She is alert.  GCS: GCS eye subscore is 4. GCS verbal subscore is 5. GCS motor subscore is 6.     Comments: Speech is clear and goal oriented, follows commands Major Cranial nerves without deficit, no facial droop Moves extremities without ataxia, coordination intact  Psychiatric:        Behavior: Behavior normal.      ED Treatments / Results  Labs (all labs ordered are listed, but only abnormal results are displayed) Labs Reviewed  CBC WITH DIFFERENTIAL/PLATELET - Abnormal; Notable for the following components:      Result Value   RBC 5.16 (*)    Platelets 149 (*)    All other components within normal limits  COMPREHENSIVE METABOLIC PANEL - Abnormal; Notable for the following components:   CO2 19 (*)    BUN 35 (*)    Creatinine, Ser 2.14 (*)    Calcium 8.7 (*)    GFR calc non Af Amer 21 (*)    GFR calc Af Amer 24 (*)    All other components within normal limits  POC OCCULT BLOOD, ED - Abnormal; Notable for the following components:   Fecal Occult Bld POSITIVE (*)    All other components within normal limits  CULTURE, BLOOD (ROUTINE X 2)  CULTURE, BLOOD (ROUTINE X 2)  SARS CORONAVIRUS 2 (HOSPITAL ORDER, [facility])  LIPASE, BLOOD  LACTIC ACID, PLASMA  URINALYSIS, ROUTINE W REFLEX MICROSCOPIC  LACTIC ACID, PLASMA     EKG None  Radiology Ct Abdomen Pelvis Wo Contrast  Result Date: 06/15/2019 CLINICAL DATA:  Acute onset generalized abdominal pain. Acute renal insufficiency precludes IV contrast. Personal history of breast cancer post malignant RIGHT lumpectomy in 2004. Surgical history also includes hysterectomy, cholecystectomy. EXAM: CT ABDOMEN AND PELVIS WITHOUT CONTRAST TECHNIQUE: Multidetector CT imaging of the abdomen and pelvis was performed following the standard protocol without IV contrast. COMPARISON:  09/27/2014 and earlier. FINDINGS: Lower chest: Mild dependent atelectasis in the lower lobes. Visualized lung bases otherwise clear. Heart mildly to moderately enlarged. Mitral and aortic annular calcification. RIGHT coronary artery atherosclerosis. Hepatobiliary: Normal unenhanced appearance of the liver. Surgically absent gallbladder. No biliary ductal dilation. Pancreas: Approximate 3 mm calcification in the distal body of the pancreas. Approximate 1.7 cm cystic lesion in the proximal tail of the pancreas (series 3, image 20). Remainder of the pancreas normal in appearance. Spleen: Normal unenhanced appearance. Adrenals/Urinary Tract: Mild LEFT adrenal hyperplasia, unchanged from the prior CT. Normal appearing RIGHT adrenal gland. Multiple BILATERAL renal cysts, the largest arising from the LOWER pole of the LEFT kidney measuring approximately 4.5 cm. Indeterminate exophytic mass arising from POSTERIOR mid LEFT kidney, not present on the prior CT. No hydronephrosis. No urinary tract calculi; intrarenal arterial calcifications mimic calculi. Normal appearing urinary bladder. Stomach/Bowel: Stomach normal in appearance for the degree of distention. Normal-appearing small bowel. Entire colon decompressed. Diffuse colonic diverticulosis without evidence of acute diverticulitis. Normal appendix in the RIGHT UPPER pelvis, filled with oral contrast. Vascular/Lymphatic: Severe a aorto-iliofemoral atherosclerosis  without evidence of aneurysm. No pathologic lymphadenopathy. Reproductive: Surgically absent uterus. No adnexal masses. Other: Moderate-sized umbilical hernia containing fat. Musculoskeletal: Moderate to severe degenerative changes in both hips and in both sacroiliac joints. Multilevel degenerative disc disease, spondylosis and severe facet degenerative changes throughout the lumbar spine. Osseous demineralization. Severe multifactorial spinal stenosis at L3-4. No acute findings. IMPRESSION: 1. No acute abnormalities involving the abdomen or pelvis. 2. Diffuse colonic diverticulosis without evidence of acute diverticulitis. 3. Indeterminate exophytic mass arising from the  mid LEFT kidney, not present on the prior CT. While this may represent a hyperdense cyst, it measures approximately 40 Hounsfield units which is the same as the normal unenhanced renal parenchyma, and therefore renal cell carcinoma is not excluded. Since the patient has acute renal insufficiency, MRI contrast should not be administered currently; initially, renal ultrasound may be useful in follow-up. 4. Approximate 3 mm calcification in the distal body of the pancreas, likely related to prior pancreatitis. 5. Severe multifactorial spinal stenosis at L3-4. Electronically Signed   By: Evangeline Dakin M.D.   On: 06/15/2019 15:28    Procedures Procedures (including critical care time)  Medications Ordered in ED Medications  sodium chloride 0.9 % bolus 1,000 mL (0 mLs Intravenous Stopped 06/15/19 1311)  morphine 2 MG/ML injection 2 mg (0.25 mg Intravenous Given 06/15/19 1103)  ondansetron (ZOFRAN) injection 2 mg (2 mg Intravenous Given 06/15/19 1059)     Initial Impression / Assessment and Plan / ED Course  I have reviewed the triage vital signs and the nursing notes.  Pertinent labs & imaging results that were available during my care of the patient were reviewed by me and considered in my medical decision making (see chart for  details).    Fluid bolus given, pain and nausea controlled.  CBC nonacute CMP with creatinine of 2.14 appears baseline Lipase within normal limits Lactic 1.2 Fecal occult stool positive Urinalysis pending CT Abd/Pelvis:  IMPRESSION:  1. No acute abnormalities involving the abdomen or pelvis.  2. Diffuse colonic diverticulosis without evidence of acute  diverticulitis.  3. Indeterminate exophytic mass arising from the mid LEFT kidney,  not present on the prior CT. While this may represent a hyperdense  cyst, it measures approximately 40 Hounsfield units which is the  same as the normal unenhanced renal parenchyma, and therefore renal  cell carcinoma is not excluded. Since the patient has acute renal  insufficiency, MRI contrast should not be administered currently;  initially, renal ultrasound may be useful in follow-up.  4. Approximate 3 mm calcification in the distal body of the  pancreas, likely related to prior pancreatitis.  5. Severe multifactorial spinal stenosis at L3-4.  - Patient is tachycardic, uncomfortable appearing, borderline febrile with new melena as well as concern for new renal malignancy, Case discussed with Dr. Zenia Resides, patient will need admission for her gastrointestinal bleeding.  Consult called to hospitalist, COVID-19 screening test ordered. ------ Discussed case with hospitalist who is asked for GI consultation, discussed case with on-call gastroenterology who advises they will see the patient tomorrow. - Patient reevaluated resting comfortably and in no acute distress states understanding of care plan and is agreeable for admission, she refuses any additional pain control at this time, she has no additional concerns or complaints.   Note: Portions of this report may have been transcribed using voice recognition software. Every effort was made to ensure accuracy; however, inadvertent computerized transcription errors may still be present. Final Clinical  Impressions(s) / ED Diagnoses   Final diagnoses:  Gastrointestinal hemorrhage, unspecified gastrointestinal hemorrhage type  Lower abdominal pain    ED Discharge Orders    None       Gari Crown 06/15/19 1648    Deliah Boston, PA-C 06/15/19 1649    Lacretia Leigh, MD 06/16/19 [phone]

## 2019-06-16 ENCOUNTER — Telehealth: Payer: Self-pay

## 2019-06-16 ENCOUNTER — Encounter: Payer: Self-pay | Admitting: Gastroenterology

## 2019-06-16 ENCOUNTER — Observation Stay (HOSPITAL_COMMUNITY): Payer: Medicare Other

## 2019-06-16 DIAGNOSIS — K219 Gastro-esophageal reflux disease without esophagitis: Secondary | ICD-10-CM | POA: Diagnosis present

## 2019-06-16 DIAGNOSIS — J449 Chronic obstructive pulmonary disease, unspecified: Secondary | ICD-10-CM | POA: Diagnosis present

## 2019-06-16 DIAGNOSIS — U071 COVID-19: Secondary | ICD-10-CM | POA: Diagnosis not present

## 2019-06-16 DIAGNOSIS — R8271 Bacteriuria: Secondary | ICD-10-CM | POA: Diagnosis present

## 2019-06-16 DIAGNOSIS — I959 Hypotension, unspecified: Secondary | ICD-10-CM | POA: Diagnosis not present

## 2019-06-16 DIAGNOSIS — I70209 Unspecified atherosclerosis of native arteries of extremities, unspecified extremity: Secondary | ICD-10-CM | POA: Diagnosis not present

## 2019-06-16 DIAGNOSIS — E875 Hyperkalemia: Secondary | ICD-10-CM | POA: Diagnosis not present

## 2019-06-16 DIAGNOSIS — N2889 Other specified disorders of kidney and ureter: Secondary | ICD-10-CM | POA: Diagnosis not present

## 2019-06-16 DIAGNOSIS — Z9221 Personal history of antineoplastic chemotherapy: Secondary | ICD-10-CM | POA: Diagnosis not present

## 2019-06-16 DIAGNOSIS — R1032 Left lower quadrant pain: Secondary | ICD-10-CM | POA: Diagnosis not present

## 2019-06-16 DIAGNOSIS — K921 Melena: Secondary | ICD-10-CM | POA: Diagnosis not present

## 2019-06-16 DIAGNOSIS — M48061 Spinal stenosis, lumbar region without neurogenic claudication: Secondary | ICD-10-CM | POA: Diagnosis present

## 2019-06-16 DIAGNOSIS — F17210 Nicotine dependence, cigarettes, uncomplicated: Secondary | ICD-10-CM | POA: Diagnosis present

## 2019-06-16 DIAGNOSIS — K922 Gastrointestinal hemorrhage, unspecified: Secondary | ICD-10-CM | POA: Diagnosis not present

## 2019-06-16 DIAGNOSIS — R0602 Shortness of breath: Secondary | ICD-10-CM | POA: Diagnosis not present

## 2019-06-16 DIAGNOSIS — D696 Thrombocytopenia, unspecified: Secondary | ICD-10-CM | POA: Diagnosis present

## 2019-06-16 DIAGNOSIS — M1990 Unspecified osteoarthritis, unspecified site: Secondary | ICD-10-CM | POA: Diagnosis present

## 2019-06-16 DIAGNOSIS — I2510 Atherosclerotic heart disease of native coronary artery without angina pectoris: Secondary | ICD-10-CM | POA: Diagnosis present

## 2019-06-16 DIAGNOSIS — I700 Atherosclerosis of aorta: Secondary | ICD-10-CM | POA: Diagnosis present

## 2019-06-16 DIAGNOSIS — Z7982 Long term (current) use of aspirin: Secondary | ICD-10-CM | POA: Diagnosis not present

## 2019-06-16 DIAGNOSIS — N179 Acute kidney failure, unspecified: Secondary | ICD-10-CM | POA: Diagnosis not present

## 2019-06-16 DIAGNOSIS — R109 Unspecified abdominal pain: Secondary | ICD-10-CM | POA: Diagnosis present

## 2019-06-16 DIAGNOSIS — R05 Cough: Secondary | ICD-10-CM | POA: Diagnosis not present

## 2019-06-16 DIAGNOSIS — Z66 Do not resuscitate: Secondary | ICD-10-CM | POA: Diagnosis not present

## 2019-06-16 DIAGNOSIS — Z79899 Other long term (current) drug therapy: Secondary | ICD-10-CM | POA: Diagnosis not present

## 2019-06-16 DIAGNOSIS — R1030 Lower abdominal pain, unspecified: Secondary | ICD-10-CM | POA: Diagnosis not present

## 2019-06-16 DIAGNOSIS — R8281 Pyuria: Secondary | ICD-10-CM | POA: Diagnosis present

## 2019-06-16 DIAGNOSIS — I998 Other disorder of circulatory system: Secondary | ICD-10-CM | POA: Diagnosis present

## 2019-06-16 DIAGNOSIS — K5730 Diverticulosis of large intestine without perforation or abscess without bleeding: Secondary | ICD-10-CM | POA: Diagnosis present

## 2019-06-16 DIAGNOSIS — I10 Essential (primary) hypertension: Secondary | ICD-10-CM | POA: Diagnosis not present

## 2019-06-16 HISTORY — DX: Unspecified abdominal pain: R10.9

## 2019-06-16 LAB — BASIC METABOLIC PANEL
Anion gap: 10 (ref 5–15)
BUN: 29 mg/dL — ABNORMAL HIGH (ref 8–23)
CO2: 17 mmol/L — ABNORMAL LOW (ref 22–32)
Calcium: 8.1 mg/dL — ABNORMAL LOW (ref 8.9–10.3)
Chloride: 110 mmol/L (ref 98–111)
Creatinine, Ser: 1.49 mg/dL — ABNORMAL HIGH (ref 0.44–1.00)
GFR calc Af Amer: 38 mL/min — ABNORMAL LOW (ref >60)
GFR calc non Af Amer: 33 mL/min — ABNORMAL LOW (ref >60)
Glucose, Bld: 84 mg/dL (ref 70–99)
Potassium: 4.4 mmol/L (ref 3.5–5.1)
Sodium: 137 mmol/L (ref 135–145)

## 2019-06-16 LAB — HEPATIC FUNCTION PANEL
ALT: 12 U/L (ref 0–44)
AST: 24 U/L (ref 15–41)
Albumin: 3 g/dL — ABNORMAL LOW (ref 3.5–5.0)
Alkaline Phosphatase: 73 U/L (ref 38–126)
Bilirubin, Direct: 0.1 mg/dL (ref 0.0–0.2)
Indirect Bilirubin: 0.5 mg/dL (ref 0.3–0.9)
Total Bilirubin: 0.6 mg/dL (ref 0.3–1.2)
Total Protein: 6.5 g/dL (ref 6.5–8.1)

## 2019-06-16 LAB — URINE CULTURE: Culture: <10000 — AB

## 2019-06-16 LAB — CBC
HCT: 37.9 % (ref 36.0–46.0)
Hemoglobin: 11.9 g/dL — ABNORMAL LOW (ref 12.0–15.0)
MCH: 26.1 pg (ref 26.0–34.0)
MCHC: 31.4 g/dL (ref 30.0–36.0)
MCV: 83.1 fL (ref 80.0–100.0)
Platelets: 126 10*3/uL — ABNORMAL LOW (ref 150–400)
RBC: 4.56 MIL/uL (ref 3.87–5.11)
RDW: 13.4 % (ref 11.5–15.5)
WBC: 4.9 10*3/uL (ref 4.0–10.5)
nRBC: 0 % (ref 0.0–0.2)

## 2019-06-16 LAB — FERRITIN: Ferritin: 111 ng/mL (ref 11–307)

## 2019-06-16 LAB — D-DIMER, QUANTITATIVE: D-Dimer, Quant: 0.82 ug/mL-FEU — ABNORMAL HIGH (ref 0.00–0.50)

## 2019-06-16 LAB — BRAIN NATRIURETIC PEPTIDE: B Natriuretic Peptide: 208.6 pg/mL — ABNORMAL HIGH (ref 0.0–100.0)

## 2019-06-16 LAB — TROPONIN I (HIGH SENSITIVITY)
Troponin I (High Sensitivity): 57 ng/L — ABNORMAL HIGH (ref <18)
Troponin I (High Sensitivity): 45 ng/L — ABNORMAL HIGH (ref <18)

## 2019-06-16 LAB — CK TOTAL AND CKMB (NOT AT ARMC)
CK, MB: 5.3 ng/mL — ABNORMAL HIGH (ref 0.5–5.0)
Relative Index: 2.2 (ref 0.0–2.5)
Total CK: 240 U/L — ABNORMAL HIGH (ref 38–234)

## 2019-06-16 LAB — LACTIC ACID, PLASMA: LACTIC ACID: 1.3 mmol/L (ref 0.4–1.8)

## 2019-06-16 LAB — LACTATE DEHYDROGENASE: LDH: 179 U/L (ref 98–192)

## 2019-06-16 LAB — PROCALCITONIN: Procalcitonin: <0.1 ng/mL

## 2019-06-16 LAB — C-REACTIVE PROTEIN: CRP: 5.5 mg/dL — ABNORMAL HIGH (ref <1.0)

## 2019-06-16 MED ORDER — METHYLPREDNISOLONE SODIUM SUCC 40 MG IJ SOLR
40.0000 mg | Freq: Two times a day (BID) | INTRAMUSCULAR | Status: DC
  Administered 2019-06-17: 40 mg via INTRAVENOUS
  Filled 2019-06-16: qty 1

## 2019-06-16 MED ORDER — VITAMIN C 500 MG PO TABS
500.0000 mg | ORAL_TABLET | Freq: Every day | ORAL | Status: DC
  Administered 2019-06-17 – 2019-06-20 (×4): 500 mg via ORAL
  Filled 2019-06-16 (×4): qty 1

## 2019-06-16 MED ORDER — SODIUM CHLORIDE 0.9 % IV SOLN
1.0000 g | INTRAVENOUS | Status: DC
  Administered 2019-06-16: 1 g via INTRAVENOUS
  Filled 2019-06-16: qty 1

## 2019-06-16 MED ORDER — ZINC SULFATE 220 (50 ZN) MG PO CAPS
220.0000 mg | ORAL_CAPSULE | Freq: Every day | ORAL | Status: DC
  Administered 2019-06-17 – 2019-06-20 (×4): 220 mg via ORAL
  Filled 2019-06-16 (×4): qty 1

## 2019-06-16 NOTE — Progress Notes (Addendum)
Patient alert and oriented x4, denies pain with assess.  [facility] arrived to transport patient to [facility] room 9124.  Attempt to call care nurse to inform patient in route x 3. Patient stable upon transfer. Daughter, April Welton notified of transfer.

## 2019-06-16 NOTE — Telephone Encounter (Signed)
Spoke to patients daughter April to let her know that patient has been scheduled for an office visit with Dr Ardis Hughs on 07/28/19 at 10:10am for abdominal pain,FOB+stool. All questions answered, daughter voiced understanding

## 2019-06-16 NOTE — Progress Notes (Signed)
PROGRESS NOTE  Linda [name] ACZ:[id number] DOB: Apr 01, 1936   PCP: Luetta Nutting, DO  Patient is from: Home  DOA: 06/15/2019 LOS: 0  Brief Narrative / Interim history: 83 year old female with history of CAD, diverticulitis, pancreatitis, GERD, hypertension, PVD and breast cancer presenting with worsening low abdominal pain for about a week and melena for 1 day.  Initially presented to PCP on 7/8.  Concern about diverticulitis.  She was started on Cipro and Flagyl, and CT abdomen and pelvis was ordered.  However, patient presented to ED due to worsening pain and melena.  In ED, incidentally tested positive for COVID.  Febrile to 100.2 (geriatric).  BP elevated to 190/91.  FOBT positive.  Hgb 13.6.  No leukocytosis.  Platelet 139.  Creatinine 2.14 (baseline 0.7-0.9).  CT A/P significant for diverticulosis left renal mass, pancreatic calcification and severe spinal stenosis at L3-4.  Admitted for GI evaluation.  Subjective: No major events overnight of this morning.  Slight drop in Hgb to 12 this morning.  She denies chest pain, dyspnea, palpitation, dizziness, emesis, hematochezia or urinary symptoms.  She admits to nausea.  Also reports chronic cough.   Objective: Vitals:   06/15/19 2319 06/16/19 0407 06/16/19 0454 06/16/19 0900  BP:  (!) 106/54  (!) 165/83  Pulse:  74  65  Resp: (!) 24   19  Temp:   98.4 F (36.9 C) 97.7 F (36.5 C)  TempSrc:   Oral Oral  SpO2:  92%  91%  Weight:      Height:        Intake/Output Summary (Last 24 hours) at 06/16/2019 1003 Last data filed at 06/16/2019 0927 Gross per 24 hour  Intake 1470.49 ml  Output -  Net 1470.49 ml   Filed Weights   06/15/19 1847 06/15/19 2103  Weight: 68 kg 69.2 kg    Examination:  GENERAL: Sitting on the edge of the bed.  No acute distress.  Appears well.  HEENT: MMM.  Vision and hearing grossly intact.  NECK: Supple.  No apparent JVD.  LUNGS:  No IWOB. Good air movement bilaterally. HEART:  RRR. Heart sounds  normal.  ABD: Bowel sounds present. Soft. Non tender.  MSK/EXT:  Moves extremities. No apparent deformity or edema.  SKIN: no apparent skin lesion or wound NEURO: Awake, alert and oriented appropriately.  No gross deficit.  PSYCH: Calm. Normal affect.   I have personally reviewed the following labs and images:  Radiology Studies: Ct Abdomen Pelvis Wo Contrast  Result Date: 06/15/2019 CLINICAL DATA:  Acute onset generalized abdominal pain. Acute renal insufficiency precludes IV contrast. Personal history of breast cancer post malignant RIGHT lumpectomy in 2004. Surgical history also includes hysterectomy, cholecystectomy. EXAM: CT ABDOMEN AND PELVIS WITHOUT CONTRAST TECHNIQUE: Multidetector CT imaging of the abdomen and pelvis was performed following the standard protocol without IV contrast. COMPARISON:  09/27/2014 and earlier. FINDINGS: Lower chest: Mild dependent atelectasis in the lower lobes. Visualized lung bases otherwise clear. Heart mildly to moderately enlarged. Mitral and aortic annular calcification. RIGHT coronary artery atherosclerosis. Hepatobiliary: Normal unenhanced appearance of the liver. Surgically absent gallbladder. No biliary ductal dilation. Pancreas: Approximate 3 mm calcification in the distal body of the pancreas. Approximate 1.7 cm cystic lesion in the proximal tail of the pancreas (series 3, image 20). Remainder of the pancreas normal in appearance. Spleen: Normal unenhanced appearance. Adrenals/Urinary Tract: Mild LEFT adrenal hyperplasia, unchanged from the prior CT. Normal appearing RIGHT adrenal gland. Multiple BILATERAL renal cysts, the largest arising from the LOWER  pole of the LEFT kidney measuring approximately 4.5 cm. Indeterminate exophytic mass arising from POSTERIOR mid LEFT kidney, not present on the prior CT. No hydronephrosis. No urinary tract calculi; intrarenal arterial calcifications mimic calculi. Normal appearing urinary bladder. Stomach/Bowel: Stomach  normal in appearance for the degree of distention. Normal-appearing small bowel. Entire colon decompressed. Diffuse colonic diverticulosis without evidence of acute diverticulitis. Normal appendix in the RIGHT UPPER pelvis, filled with oral contrast. Vascular/Lymphatic: Severe a aorto-iliofemoral atherosclerosis without evidence of aneurysm. No pathologic lymphadenopathy. Reproductive: Surgically absent uterus. No adnexal masses. Other: Moderate-sized umbilical hernia containing fat. Musculoskeletal: Moderate to severe degenerative changes in both hips and in both sacroiliac joints. Multilevel degenerative disc disease, spondylosis and severe facet degenerative changes throughout the lumbar spine. Osseous demineralization. Severe multifactorial spinal stenosis at L3-4. No acute findings. IMPRESSION: 1. No acute abnormalities involving the abdomen or pelvis. 2. Diffuse colonic diverticulosis without evidence of acute diverticulitis. 3. Indeterminate exophytic mass arising from the mid LEFT kidney, not present on the prior CT. While this may represent a hyperdense cyst, it measures approximately 40 Hounsfield units which is the same as the normal unenhanced renal parenchyma, and therefore renal cell carcinoma is not excluded. Since the patient has acute renal insufficiency, MRI contrast should not be administered currently; initially, renal ultrasound may be useful in follow-up. 4. Approximate 3 mm calcification in the distal body of the pancreas, likely related to prior pancreatitis. 5. Severe multifactorial spinal stenosis at L3-4. Electronically Signed   By: Evangeline Dakin M.D.   On: 06/15/2019 15:28   Dg Chest Port 1 View  Result Date: 06/16/2019 CLINICAL DATA:  Shortness of breath. Positive for COVID-19 EXAM: PORTABLE CHEST 1 VIEW COMPARISON:  Chest x-rays dated 09/27/2014 in 01/12/2014. FINDINGS: Heart size and mediastinal contours are stable. Coarse interstitial markings are again seen bilaterally  indicating some degree of chronic interstitial lung disease. No confluent opacity to suggest superimposed pneumonia. No pleural effusion or pneumothorax seen. No acute appearing osseous abnormality. IMPRESSION: 1. No active disease. No evidence of pneumonia or pulmonary edema. 2. Probable chronic interstitial lung disease. Electronically Signed   By: Franki Cabot M.D.   On: 06/16/2019 08:32    Microbiology: Recent Results (from the past 240 hour(s))  SARS Coronavirus 2 (CEPHEID - Performed in [place] hospital lab), Hosp Order     Status: Abnormal   Collection Time: 06/15/19  6:15 PM   Specimen: Nasopharyngeal Swab  Result Value Ref Range Status   SARS Coronavirus 2 POSITIVE (A) NEGATIVE Final    Comment: RESULT CALLED TO, READ BACK BY AND VERIFIED WITH: Marjory Sneddon RN 06/15/19 1925 JDW (NOTE) If result is NEGATIVE SARS-CoV-2 target nucleic acids are NOT DETECTED. The SARS-CoV-2 RNA is generally detectable in upper and lower  respiratory specimens during the acute phase of infection. The lowest  concentration of SARS-CoV-2 viral copies this assay can detect is 250  copies / mL. A negative result does not preclude SARS-CoV-2 infection  and should not be used as the sole basis for treatment or other  patient management decisions.  A negative result may occur with  improper specimen collection / handling, submission of specimen other  than nasopharyngeal swab, presence of viral mutation(s) within the  areas targeted by this assay, and inadequate number of viral copies  (<250 copies / mL). A negative result must be combined with clinical  observations, patient history, and epidemiological information. If result is POSITIVE SARS-CoV-2 target nucleic acids are DETECTED. The SARS-C oV-2 RNA is generally  detectable in upper and lower  respiratory specimens during the acute phase of infection.  Positive  results are indicative of active infection with SARS-CoV-2.  Clinical  correlation with  patient history and other diagnostic information is  necessary to determine patient infection status.  Positive results do  not rule out bacterial infection or co-infection with other viruses. If result is PRESUMPTIVE POSTIVE SARS-CoV-2 nucleic acids MAY BE PRESENT.   A presumptive positive result was obtained on the submitted specimen  and confirmed on repeat testing.  While 2019 novel coronavirus  (SARS-CoV-2) nucleic acids may be present in the submitted sample  additional confirmatory testing may be necessary for epidemiological  and / or clinical management purposes  to differentiate between  SARS-CoV-2 and other Sarbecovirus currently known to infect humans.  If clinically indicated additional testing with an alternate test  methodology [phone]) is advised.  The SARS-CoV-2 RNA is generally  detectable in upper and lower respiratory specimens during the acute  phase of infection. The expected result is Negative. Fact Sheet for Patients:  StrictlyIdeas.no Fact Sheet for Healthcare Providers: BankingDealers.co.za This test is not yet approved or cleared by the [place] FDA and has been authorized for detection and/or diagnosis of SARS-CoV-2 by FDA under an Emergency Use Authorization (EUA).  This EUA will remain in effect (meaning this test can be used) for the duration of the COVID-19 declaration under Section 564(b)(1) of the Act, 21 U.S.C. section 360bbb-3(b)(1), unless the authorization is terminated or revoked sooner. Performed at [facility], [place] [address], [place], [place] [address]   MRSA PCR Screening     Status: None   Collection Time: 06/15/19  9:09 PM   Specimen: Nasopharyngeal  Result Value Ref Range Status   MRSA by PCR NEGATIVE NEGATIVE Final    Comment:        The GeneXpert MRSA Assay (FDA approved for NASAL specimens only), is one component of a comprehensive MRSA colonization surveillance program.  It is not intended to diagnose MRSA infection nor to guide or monitor treatment for MRSA infections. Performed at [facility], [place] [address] [place],  [address]     Sepsis Labs: Invalid input(s): PROCALCITONIN, LACTICIDVEN  Urine analysis:    Component Value Date/Time   COLORURINE YELLOW 06/15/2019 1310   APPEARANCEUR CLEAR 06/15/2019 1310   LABSPEC 1.013 06/15/2019 1310   PHURINE 5.0 06/15/2019 1310   GLUCOSEU NEGATIVE 06/15/2019 1310   HGBUR MODERATE (A) 06/15/2019 1310   BILIRUBINUR NEGATIVE 06/15/2019 1310   BILIRUBINUR NEGATIVE 06/14/2019 1232   KETONESUR NEGATIVE 06/15/2019 1310   PROTEINUR 100 (A) 06/15/2019 1310   UROBILINOGEN 0.2 06/14/2019 1232   UROBILINOGEN 1.0 09/27/2014 2044   NITRITE NEGATIVE 06/15/2019 1310   LEUKOCYTESUR LARGE (A) 06/15/2019 1310    Anemia Panel: Recent Labs    06/16/19 0743  FERRITIN 111    Thyroid Function Tests: No results for input(s): TSH, T4TOTAL, FREET4, T3FREE, THYROIDAB in the last 72 hours.  Lipid Profile: No results for input(s): CHOL, HDL, LDLCALC, TRIG, CHOLHDL, LDLDIRECT in the last 72 hours.  CBG: No results for input(s): GLUCAP in the last 168 hours.  HbA1C: No results for input(s): HGBA1C in the last 72 hours.  BNP (last 3 results): No results for input(s): PROBNP in the last 8760 hours.  Cardiac Enzymes: Recent Labs  Lab 06/16/19 0743  CKTOTAL 240*  CKMB 5.3*    Coagulation Profile: No results for input(s): INR, PROTIME in the last 168 hours.  Liver  Function Tests: Recent Labs  Lab 06/14/19 1057 06/14/19 1439 06/15/19 1009 06/16/19 0743  AST 20 25 25 24   ALT 10 13 13 12   ALKPHOS 100 88 87 73  BILITOT 0.4 0.7 0.4 0.6  PROT 7.5 7.6 7.6 6.5  ALBUMIN 4.0 3.5 3.5 3.0*   Recent Labs  Lab 06/14/19 1057 06/14/19 1439 06/15/19 1009  LIPASE 38.0 35 39   No results for input(s): AMMONIA in the last 168 hours.  Basic Metabolic Panel: Recent Labs  Lab 06/14/19 1057  06/14/19 1439 06/15/19 1009 06/16/19 0521  NA 141 139 138 137  K 5.4 No hemolysis seen* 4.5 4.3 4.4  CL 107 106 107 110  CO2 21 20* 19* 17*  GLUCOSE 92 89 92 84  BUN 26* 27* 35* 29*  CREATININE 1.71* 2.12* 2.14* 1.49*  CALCIUM 8.9 8.7* 8.7* 8.1*   GFR: Estimated Creatinine Clearance: 27.6 mL/min (A) (by C-G formula based on SCr of 1.49 mg/dL (H)).  CBC: Recent Labs  Lab 06/14/19 1057 06/14/19 1439 06/15/19 1009 06/16/19 0521  WBC 4.8 4.5 5.1 4.9  NEUTROABS 2.6  --  3.0  --   HGB 14.1 13.5 13.6 11.9*  HCT 43.8 43.8 43.5 37.9  MCV 82.2 84.7 84.3 83.1  PLT 161.0 165 149* 126*    Procedures:  None  Microbiology summarized: 7/9-COVID-19 positive 7/9-MRSA PCR negative 7/9-urine culture pending 7/9-blood cultures pending.  Assessment & Plan: COVID-19 infection: Incidentally tested positive for chlamydia in ED.  Patient has no respiratory symptoms but mild fever.  Inflammatory markers elevated.  Good saturation on room air.  No acute finding on CXR. -We will continue observation for respiratory distress. -Transfer to [place].  Abdominal pain/melena: Pain has subsided this morning.  Exam is benign.  -FOBT positive in ED. -CT abdomen with diverticulosis but no diverticulitis. Also possible  left RCC.  -GI consulted-appreciate input-ouptatient follow up but waiting on MD attestationn -Hgb slightly dropped to 12 this morning-likely dilutional.  -Follow urine and blood cultures.  -Start p.o. PPI  Pyuria/bacteriuria:  denies UTI symptoms but has fever and pyuria. -Start ceftriaxone -Follow urine cultures  Acute kidney injury with mild azotemia: Creatinine 2.14> 1.49 (baseline 0.7-0.9): Likely prerenal although BUN is not significantly elevated.  She is not on nephrotoxic meds. -Continue monitoring  Essential hypertension: BP elevated on admission but improved. -Continue home amlodipine, clonidine, hydralazine and verapamil  Left renal mass: Noted on CT abdomen and pelvis.   Concern for RCC. -Discussed with urology, Dr. Alinda Money who recommended outpatient follow-up.  History of CAD/PVD -Continue home aspirin and Lipitor  History of breast cancer: Status post lumpectomy  DVT prophylaxis: SCD in the setting of possible GI bleed Code Status: DNR/DNI Family Communication: Attempted to call patient's daughter but no answer.  Did not leave voicemail. Disposition Plan: Transfer to [place] once cleared by GI Consultants: GI, urology   Antimicrobials: Anti-infectives (From admission, onward)   None      Sch Meds:  Scheduled Meds: . atorvastatin  40 mg Oral q1800  . cloNIDine  0.2 mg Oral BID  . gabapentin  300 mg Oral Daily  . hydrALAZINE  25 mg Oral QID  . pantoprazole  40 mg Oral Daily  . verapamil  240 mg Oral QHS   Continuous Infusions: . sodium chloride 75 mL/hr at 06/16/19 0927   PRN Meds:.acetaminophen **OR** acetaminophen, HYDROcodone-acetaminophen, ondansetron **OR** ondansetron (ZOFRAN) IV, polyethylene glycol    T. Grinnell  If 7PM-7AM, please contact night-coverage [email or web address] Password [email or web address] 06/16/2019, 10:03  AM  

## 2019-06-16 NOTE — Consult Note (Addendum)
[facility] Consult: 8:20 AM 06/16/2019  LOS: 0 days    Referring Provider: Dr Lonny Prude  Primary Care Physician:  Milus Banister, MD Primary Gastroenterologist:  Dr. Delfin Edis    Reason for Consultation:  abd pain and FOBT +   HPI: Linda [name] is a 83 y.o. female.  PMH pancreatitis, remote.  CAD.  GERD.  Diverticulosis.  Htn.  CAD.  Carotid artery stenosis, s/p CEA.  Breast cancer, 2004 lumpectomy.  Hysterectomy.  Pancreatitis attributed to Lamisil (abdominal pain with lipase 115, but no pancreatitis seen on CT) 12/2011. 2003 Colonoscopy.  For positive family history colon cancer.  Diverticulosis, no polyps. 2013 Colonoscopy.  For family history colon cancer in a grandparent.  Moderate diverticulosis, otherwise normal exam.  Hypertension 238/120 during exam.  No plans for future screenings due to difficult intubation, hypertension during procedure.  Home meds include 81 ASA and Dexilant  Starting Saturday, 6 d ago, began bil pain in lower belly, mild to moderate initially, intensified in next few days.  Seen Wednesday by Dr Zigmund Nahiara Kretzschmar, pmd. Was RXd cipro and flagyl for possible diverticulitis, never picked up meds as she was called Thursday due to abnormal labs (AKI), advised to go to ED.   No nausea, vomiting escept nausea this AM.  Several months of anorexia.  No dysphagia.  No GERD sxs.  No past or current change in BM's: they are brown, formed, not bloody etc.  No weight loss.  No subjective fever or chills  Temp 100.2.  BP to 190/91.     CTAP, without contrast: diverticulosis.  4.5 cm exophytic mass L kidney.  Pancreatic calcifications.  Severe aorto-iliofemoral atherosclerosis, no aneurysm.  Severe L spine stenosis.   Lipase, LFTs normal. AKI, improved with hydration overnight. Hgb 13.6 >> 11.9.  MCV  83. No leukocytosis. Platelets 126.   Covid 19 positive.    Only pain meds thus far were 0.25 mg Morphine @ 11 PM and 2 mg po hydrocodone at 0600 today.  Pt says pain is better.  Says pain was never severe enough that she would have come to ED, came to ED per rec of PMD. Really wants to go home.   Has chronic, stable DOE and cough.  Still smokes.  No new resp issues to c/w the Covid 19 positive test.       Past Medical History:  Diagnosis Date  . Arthritis   . Breast cancer ([place])   . Constipation   . Diverticulosis of colon (without mention of hemorrhage)   . GERD (gastroesophageal reflux disease)   . Hypertension   . Nail fungus   . Uterine fibroid     Past Surgical History:  Procedure Laterality Date  . ABDOMINAL HYSTERECTOMY    . BREAST LUMPECTOMY Right 2004   radiation and chemo  . CHOLECYSTECTOMY  ~1980  . COLONOSCOPY     Hx; of  . ENDARTERECTOMY Right 01/19/2014   Procedure: ENDARTERECTOMY CAROTID;  Surgeon: Serafina Mitchell, MD;  Location: [facility];  Service: Vascular;  Laterality: Right;  . Right lumpectomy  with right sentinel lymph node dissection  2004   Dr. Bubba Camp  . VAGINAL HYSTERECTOMY      Prior to Admission medications   Medication Sig Start Date End Date Taking? Authorizing Provider  aspirin EC 81 MG tablet Take 1 tablet (81 mg total) by mouth daily. 02/24/12  Yes [name], MD  atorvastatin (LIPITOR) 40 MG tablet TAKE ONE TABLET BY MOUTH EACH EVENING Patient taking differently: Take 40 mg by mouth daily at 6 PM.  05/03/19  Yes Luetta Nutting, DO  cloNIDine (CATAPRES) 0.2 MG tablet TAKE 1 TABLET BY MOUTH TWICE A DAY Patient taking differently: Take 0.2 mg by mouth 2 (two) times daily.  04/06/19  Yes Matthews, Cody, DO  DEXILANT 60 MG capsule TAKE 1 CAPSULE BY MOUTH EVERY DAY Patient taking differently: Take 60 mg by mouth daily.  02/05/19  Yes Luetta Nutting, DO  gabapentin (NEURONTIN) 300 MG capsule TAKE 1 CAPSULE BY MOUTH EVERY DAY Patient taking  differently: Take 300 mg by mouth daily.  04/13/19  Yes Luetta Nutting, DO  hydrALAZINE (APRESOLINE) 25 MG tablet TAKE 1 TABLET BY MOUTH FOUR TIMES A DAY Patient taking differently: Take 25 mg by mouth 4 (four) times daily.  07/13/18  Yes Luetta Nutting, DO  verapamil (VERELAN PM) 120 MG 24 hr capsule TAKE 2 CAPSULES EVERY EVENING Patient taking differently: Take 240 mg by mouth at bedtime.  03/14/19  Yes Luetta Nutting, DO  ciprofloxacin (CIPRO) 500 MG tablet Take 1 tablet (500 mg total) by mouth 2 (two) times daily. Patient not taking: Reported on 06/15/2019 06/14/19   Luetta Nutting, DO  metroNIDAZOLE (FLAGYL) 500 MG tablet Take 1 tablet (500 mg total) by mouth 3 (three) times daily. Patient not taking: Reported on 06/15/2019 06/14/19   Luetta Nutting, DO  omeprazole (PRILOSEC) 20 MG capsule Take 1 capsule (20 mg total) by mouth daily. 12/10/11 02/24/12  Pitylak, Anderson Malta, PA-C    Scheduled Meds: . atorvastatin  40 mg Oral q1800  . cloNIDine  0.2 mg Oral BID  . gabapentin  300 mg Oral Daily  . hydrALAZINE  25 mg Oral QID  . pantoprazole  40 mg Oral Daily  . verapamil  240 mg Oral QHS   Infusions: . sodium chloride 75 mL/hr at 06/15/19 2022   PRN Meds: acetaminophen **OR** acetaminophen, HYDROcodone-acetaminophen, ondansetron **OR** ondansetron (ZOFRAN) IV, polyethylene glycol   Allergies as of 06/15/2019  . (No Known Allergies)    Family History  Problem Relation Age of Onset  . Colon cancer Mother   . Cancer Mother        Kidney  . Pneumonia Father 35       Double  . Colon cancer Maternal Grandmother     Social History   Socioeconomic History  . Marital status: Single    Spouse name: Not on file  . Number of children: 2  . Years of education: Not on file  . Highest education level: Not on file  Occupational History  . Occupation: Retired  Scientific laboratory technician  . Financial resource strain: Not on file  . Food insecurity    Worry: Not on file    Inability: Not on file  .  Transportation needs    Medical: Not on file    Non-medical: Not on file  Tobacco Use  . Smoking status: Current Every Day Smoker    Packs/day: 1.00    Types: Cigarettes  . Smokeless tobacco: Never Used  . Tobacco comment: form given 12/14/11  Substance and Sexual  Activity  . Alcohol use: No    Alcohol/week: 0.0 standard drinks  . Drug use: No  . Sexual activity: Never  Lifestyle  . Physical activity    Days per week: Not on file    Minutes per session: Not on file  . Stress: Not on file  Relationships  . Social Herbalist on phone: Not on file    Gets together: Not on file    Attends religious service: Not on file    Active member of club or organization: Not on file    Attends meetings of clubs or organizations: Not on file    Relationship status: Not on file  . Intimate partner violence    Fear of current or ex partner: Not on file    Emotionally abused: Not on file    Physically abused: Not on file    Forced sexual activity: Not on file  Other Topics Concern  . Not on file  Social History Narrative  . Not on file    REVIEW OF SYSTEMS: Constitutional:  No new fatigue or weakness ENT:  No nose bleeds Pulm: Per HPI CV:  No palpitations, no LE edema.  No chest pressure, angina. GU:  No hematuria, no frequency.  Unremarkable bilateral mammogram in 09/2017 GI: See HPI. Heme: No unusual or excessive bleeding or bruising. Transfusions: No prior transfusion with blood products. Neuro:  No headaches, no peripheral tingling or numbness.  No change in vision. Derm:  No itching, no rash or sores.  Endocrine:  No sweats or chills.  No polyuria or dysuria Immunization: Reviewed vaccination history.  She had flu shot in 11/2018 and pneumococcal 23 vaccination 2015. Travel:  None beyond local counties in last few months.    PHYSICAL EXAM: Vital signs in last 24 hours: Vitals:   06/16/19 0407 06/16/19 0454  BP: (!) 106/54   Pulse: 74   Resp:    Temp:  98.4 F  (36.9 C)  SpO2: 92%    Wt Readings from Last 3 Encounters:  06/15/19 69.2 kg  06/14/19 68 kg  02/09/19 68 kg    General: Patient not physically examined but interacted with patient with at least 10 minutes phone call into the room Given COVID positive status and improved abdominal pain, did not elect to enter the room for exam.   Intake/Output from previous day: 07/09 0701 - 07/10 0700 In: 1450.5 [I.V.:500.5; IV Piggyback:950] Out: -  Intake/Output this shift: No intake/output data recorded.  LAB RESULTS: Recent Labs    06/14/19 1439 06/15/19 1009 06/16/19 0521  WBC 4.5 5.1 4.9  HGB 13.5 13.6 11.9*  HCT 43.8 43.5 37.9  PLT 165 149* 126*   BMET Lab Results  Component Value Date   NA 137 06/16/2019   NA 138 06/15/2019   NA 139 06/14/2019   K 4.4 06/16/2019   K 4.3 06/15/2019   K 4.5 06/14/2019   CL 110 06/16/2019   CL 107 06/15/2019   CL 106 06/14/2019   CO2 17 (L) 06/16/2019   CO2 19 (L) 06/15/2019   CO2 20 (L) 06/14/2019   GLUCOSE 84 06/16/2019   GLUCOSE 92 06/15/2019   GLUCOSE 89 06/14/2019   BUN 29 (H) 06/16/2019   BUN 35 (H) 06/15/2019   BUN 27 (H) 06/14/2019   CREATININE 1.49 (H) 06/16/2019   CREATININE 2.14 (H) 06/15/2019   CREATININE 2.12 (H) 06/14/2019   CALCIUM 8.1 (L) 06/16/2019   CALCIUM 8.7 (L) 06/15/2019   CALCIUM  8.7 (L) 06/14/2019   LFT Recent Labs    06/14/19 1057 06/14/19 1439 06/15/19 1009  PROT 7.5 7.6 7.6  ALBUMIN 4.0 3.5 3.5  AST 20 25 25   ALT 10 13 13   ALKPHOS 100 88 87  BILITOT 0.4 0.7 0.4   PT/INR Lab Results  Component Value Date   INR 1.04 01/12/2014   Lipase     Component Value Date/Time   LIPASE 39 06/15/2019 1009      RADIOLOGY STUDIES: Ct Abdomen Pelvis Wo Contrast  Result Date: 06/15/2019 CLINICAL DATA:  Acute onset generalized abdominal pain. Acute renal insufficiency precludes IV contrast. Personal history of breast cancer post malignant RIGHT lumpectomy in 2004. Surgical history also includes  hysterectomy, cholecystectomy. EXAM: CT ABDOMEN AND PELVIS WITHOUT CONTRAST TECHNIQUE: Multidetector CT imaging of the abdomen and pelvis was performed following the standard protocol without IV contrast. COMPARISON:  09/27/2014 and earlier. FINDINGS: Lower chest: Mild dependent atelectasis in the lower lobes. Visualized lung bases otherwise clear. Heart mildly to moderately enlarged. Mitral and aortic annular calcification. RIGHT coronary artery atherosclerosis. Hepatobiliary: Normal unenhanced appearance of the liver. Surgically absent gallbladder. No biliary ductal dilation. Pancreas: Approximate 3 mm calcification in the distal body of the pancreas. Approximate 1.7 cm cystic lesion in the proximal tail of the pancreas (series 3, image 20). Remainder of the pancreas normal in appearance. Spleen: Normal unenhanced appearance. Adrenals/Urinary Tract: Mild LEFT adrenal hyperplasia, unchanged from the prior CT. Normal appearing RIGHT adrenal gland. Multiple BILATERAL renal cysts, the largest arising from the LOWER pole of the LEFT kidney measuring approximately 4.5 cm. Indeterminate exophytic mass arising from POSTERIOR mid LEFT kidney, not present on the prior CT. No hydronephrosis. No urinary tract calculi; intrarenal arterial calcifications mimic calculi. Normal appearing urinary bladder. Stomach/Bowel: Stomach normal in appearance for the degree of distention. Normal-appearing small bowel. Entire colon decompressed. Diffuse colonic diverticulosis without evidence of acute diverticulitis. Normal appendix in the RIGHT UPPER pelvis, filled with oral contrast. Vascular/Lymphatic: Severe a aorto-iliofemoral atherosclerosis without evidence of aneurysm. No pathologic lymphadenopathy. Reproductive: Surgically absent uterus. No adnexal masses. Other: Moderate-sized umbilical hernia containing fat. Musculoskeletal: Moderate to severe degenerative changes in both hips and in both sacroiliac joints. Multilevel degenerative  disc disease, spondylosis and severe facet degenerative changes throughout the lumbar spine. Osseous demineralization. Severe multifactorial spinal stenosis at L3-4. No acute findings. IMPRESSION: 1. No acute abnormalities involving the abdomen or pelvis. 2. Diffuse colonic diverticulosis without evidence of acute diverticulitis. 3. Indeterminate exophytic mass arising from the mid LEFT kidney, not present on the prior CT. While this may represent a hyperdense cyst, it measures approximately 40 Hounsfield units which is the same as the normal unenhanced renal parenchyma, and therefore renal cell carcinoma is not excluded. Since the patient has acute renal insufficiency, MRI contrast should not be administered currently; initially, renal ultrasound may be useful in follow-up. 4. Approximate 3 mm calcification in the distal body of the pancreas, likely related to prior pancreatitis. 5. Severe multifactorial spinal stenosis at L3-4. Electronically Signed   By: Evangeline Dakin M.D.   On: 06/15/2019 15:28      IMPRESSION:   *   Abdominal pain.  Diverticulosis but no evidence of diverticulitis on noncontrast CT.  Pancreatic calcifications per CT, LFTs/lipase normal.  Suspected mild acute pancreatitis in 2013 attributed to Lamisil.  Given finding of "severe" aorto-illiofemoral vasc dz on CT, wonder if this is ischemic etiology.   Patient does not give history of postprandial abdominal pain, does have chronic anorexia. Current  pain mgt with in frequent PO hydrocodone.  The abdominal pain has improved, it was never severe enough that she would have come to the ED on her own.  *    FOBT positive.  No melena, no blood per rectum reported by the patient.  *   COVID 19 positive.  No increase in chronic respiratory symptoms from her COPD.  *   Left renal mass.  Seen on CT.  Unlikely this is responsible for lower abdominal pain.  *   Hypertension.    *   Noncritical thrombocytopenia.  *    Normocytic anemia  following administration of IV fluids. 1 liter bolus NS  Then/ongoing 75/hour.    *    AKI, no previous labs suggestive of CKD.  Improved.  *   Remote breast cancer, status post 2004 lumpectomy.  No evidence of breast mass on latest 2018 mammogram.   PLAN:     *   Will arrange office follow-up in about a month at which time Dr. Ardis Hughs can address heme positive stool and any interim abdominal pain.    *   From GI standpoint, she can discharge home.  Should continue Dexilant   Azucena Freed  06/16/2019, 8:20 AM Phone [phone]    ________________________________________________________________________  Velora Heckler GI MD note:  I reviewed the data and agree with the assessment and plan described above. I did not meet her in person due to Covid +.   Her abd pains are minimal, improving. She has incidental heme + stools which I am happy to discuss with her in my office in 6-8 weeks when she is no longer Covid +. My office will arrange the follow up.  I discussed with Dr. Cyndia Skeeters, she is safe to transfer to [facility].  Please call or page with any further questions or concerns.    Owens Loffler, MD [facility] Pager [phone]

## 2019-06-17 DIAGNOSIS — K922 Gastrointestinal hemorrhage, unspecified: Secondary | ICD-10-CM

## 2019-06-17 DIAGNOSIS — U071 COVID-19: Secondary | ICD-10-CM

## 2019-06-17 LAB — COMPREHENSIVE METABOLIC PANEL
ALT: 12 U/L (ref 0–44)
AST: 21 U/L (ref 15–41)
Albumin: 3 g/dL — ABNORMAL LOW (ref 3.5–5.0)
Alkaline Phosphatase: 64 U/L (ref 38–126)
Anion gap: 8 (ref 5–15)
BUN: 32 mg/dL — ABNORMAL HIGH (ref 8–23)
CO2: 21 mmol/L — ABNORMAL LOW (ref 22–32)
Calcium: 7.8 mg/dL — ABNORMAL LOW (ref 8.9–10.3)
Chloride: 109 mmol/L (ref 98–111)
Creatinine, Ser: 1.48 mg/dL — ABNORMAL HIGH (ref 0.44–1.00)
GFR calc Af Amer: 38 mL/min — ABNORMAL LOW (ref >60)
GFR calc non Af Amer: 33 mL/min — ABNORMAL LOW (ref >60)
Glucose, Bld: 87 mg/dL (ref 70–99)
Potassium: 4.7 mmol/L (ref 3.5–5.1)
Sodium: 138 mmol/L (ref 135–145)
Total Bilirubin: 0.3 mg/dL (ref 0.3–1.2)
Total Protein: 6.5 g/dL (ref 6.5–8.1)

## 2019-06-17 LAB — CBC
HCT: 36.6 % (ref 36.0–46.0)
Hemoglobin: 11.2 g/dL — ABNORMAL LOW (ref 12.0–15.0)
MCH: 26.3 pg (ref 26.0–34.0)
MCHC: 30.6 g/dL (ref 30.0–36.0)
MCV: 85.9 fL (ref 80.0–100.0)
Platelets: 141 10*3/uL — ABNORMAL LOW (ref 150–400)
RBC: 4.26 MIL/uL (ref 3.87–5.11)
RDW: 13.9 % (ref 11.5–15.5)
WBC: 5 10*3/uL (ref 4.0–10.5)
nRBC: 0 % (ref 0.0–0.2)

## 2019-06-17 LAB — C-REACTIVE PROTEIN: CRP: 5 mg/dL — ABNORMAL HIGH (ref <1.0)

## 2019-06-17 LAB — D-DIMER, QUANTITATIVE: D-Dimer, Quant: 0.71 ug/mL-FEU — ABNORMAL HIGH (ref 0.00–0.50)

## 2019-06-17 LAB — MAGNESIUM: Magnesium: 1.7 mg/dL (ref 1.7–2.4)

## 2019-06-17 LAB — PHOSPHORUS: Phosphorus: 2.9 mg/dL (ref 2.5–4.6)

## 2019-06-17 LAB — FERRITIN: Ferritin: 108 ng/mL (ref 11–307)

## 2019-06-17 MED ORDER — ORAL CARE MOUTH RINSE
15.0000 mL | Freq: Two times a day (BID) | OROMUCOSAL | Status: DC
  Administered 2019-06-17 – 2019-06-20 (×5): 15 mL via OROMUCOSAL

## 2019-06-17 MED ORDER — DEXAMETHASONE 6 MG PO TABS
6.0000 mg | ORAL_TABLET | Freq: Every day | ORAL | Status: DC
  Administered 2019-06-17 – 2019-06-20 (×4): 6 mg via ORAL
  Filled 2019-06-17 (×4): qty 1

## 2019-06-17 MED ORDER — CEFDINIR 300 MG PO CAPS
300.0000 mg | ORAL_CAPSULE | Freq: Two times a day (BID) | ORAL | Status: DC
  Administered 2019-06-17 – 2019-06-20 (×7): 300 mg via ORAL
  Filled 2019-06-17 (×9): qty 1

## 2019-06-17 NOTE — Progress Notes (Signed)
IV team unsucessful x 2 attempts for PIV placement. Dr Gloris Ham aware and states he will change any IV meds to PO and plans to discharge patient home tomorrow.

## 2019-06-17 NOTE — Plan of Care (Signed)

## 2019-06-17 NOTE — Progress Notes (Signed)
Paged Dr. Orie Rout via She[facility] Evans and made him aware that patient is having 2 second pauses in her rhythm.

## 2019-06-17 NOTE — Progress Notes (Signed)
Patient called and spoke with her daughter after arriving to [facility].

## 2019-06-17 NOTE — Progress Notes (Signed)
PROGRESS NOTE  Linda [name] KWI:[id number] DOB: 03-Jan-1936 DOA: 06/15/2019 PCP: Luetta Nutting, DO   LOS: 1 day   Brief Narrative / Interim history: 83 year old female with coronary artery disease, prior diverticulitis, pancreatitis, reflux, hypertension, PVD, breast cancer was admitted to the hospital on 06/15/2019 due to bilateral lower quadrants abdominal pain for about a week as well as melena for about a day.  He went to the PCP on 7/8, there was concern for diverticulitis and she was started on ciprofloxacin and Flagyl.  PCP ordered a CT scan of the abdomen pelvis however she had worsening pain and decided to come to the ED.  She was found to have a fever of 100.2 and incidentally she tested positive for Covid. Gastroenterology was consulted, evaluated patient, signed off and then she was transferred to [facility]  Subjective: Very mild confusion this morning, quite upset, patient tells me that she does not recall seeing the gastroenterologist nor talking to him over the phone.  Tells me her abdominal pain has improved.  Assessment & Plan: Active Problems:   Essential hypertension   Carotid artery disease (HCC)   GERD (gastroesophageal reflux disease)   Lower abdominal pain   AKI (acute kidney injury) ([place])   Abdominal pain   Principal Problem Covid-19 Viral Illness -More or less this seems to be incidental finding, she has no respiratory symptoms but does have mild fever. -Inflammatory markers elevated but improving this morning   COVID-19 Labs  Recent Labs    06/16/19 0743 06/17/19 0200  DDIMER 0.82* 0.71*  FERRITIN 111 108  LDH 179  --   CRP 5.5* 5.0*    Lab Results  Component Value Date   SARSCOV2NAA POSITIVE (A) 06/15/2019    Active Problems Abdominal pain/melena -Patient underwent a CT scan of the abdomen and pelvis which did not show any evidence of diverticulitis -Gastroenterology was consulted and evaluated patient while hospitalized, and GI  feels like her abdominal pain may have an ischemic etiology given findings of severe aorto iliofemoral vascular disease on the CT scan. -From gastroenterology standpoint current plans are for outpatient follow-up without any needs for additional testing/treatments right now  Acute kidney injury -Creatinine improved with fluids but not normal, continue IV fluids until tomorrow. -She has a creatinine of 0.9 in 2019, 2.14 this admission and currently 1.4.  Pyuria/bacteriuria -No significant UTI type symptoms however patient is a difficult historian but she is persistent and discussing about her abdomen.  Her urine cultures are negative however there is a possibility of a UTI given lower abdominal pain along with a fever.  She received ceftriaxone, currently lost IV access and will change to oral antibiotics for total of 5 days  Essential hypertension -Continue home medications.  May need to decrease clonidine dose if she has bradycardia  Left renal mass -Noted on CT of the abdomen and pelvis, Dr. Cyndia Skeeters discussed with urology Dr. Alinda Money who recommended outpatient follow-up  History of CAD/PVD -Continue home aspirin, Lipitor  History of breast cancer -Status post lumpectomy  Scheduled Meds: . atorvastatin  40 mg Oral q1800  . cloNIDine  0.2 mg Oral BID  . dexamethasone  6 mg Oral Daily  . gabapentin  300 mg Oral Daily  . hydrALAZINE  25 mg Oral QID  . mouth rinse  15 mL Mouth Rinse BID  . pantoprazole  40 mg Oral Daily  . verapamil  240 mg Oral QHS  . vitamin C  500 mg Oral Daily  . zinc  sulfate  220 mg Oral Daily   Continuous Infusions: . sodium chloride Stopped (06/17/19 0613)   PRN Meds:.acetaminophen **OR** acetaminophen, HYDROcodone-acetaminophen, ondansetron **OR** ondansetron (ZOFRAN) IV, polyethylene glycol  DVT prophylaxis: SCDs Code Status: DNR Family Communication: Discussed with patient's daughter over the phone Disposition Plan: home 1 day pending renal function    Consultants:   GI  Procedures:   None   Antimicrobials:  Ceftriaxone 7/9 >> 7/11  Cefpodoxime 7/11>>   Objective: Vitals:   06/17/19 0445 06/17/19 0600 06/17/19 0750 06/17/19 1002  BP: (!) 99/57 134/71 132/80 (!) 141/62  Pulse: 67 67 72   Resp: (!) 22 18 17    Temp: (!) 100.4 F (38 C)  98.3 F (36.8 C)   TempSrc: Oral  Oral   SpO2: 94% 100% 96%   Weight:      Height:        Intake/Output Summary (Last 24 hours) at 06/17/2019 1057 Last data filed at 06/17/2019 0750 Gross per 24 hour  Intake 1728.95 ml  Output -  Net 1728.95 ml   Filed Weights   06/15/19 1847 06/15/19 2103  Weight: 68 kg 69.2 kg    Examination:  Constitutional: NAD Eyes: PERRL, lids and conjunctivae normal ENMT: Mucous membranes are moist. No oropharyngeal exudates Neck: normal, supple, no masses, no thyromegaly Respiratory: clear to auscultation bilaterally, no wheezing, no crackles. Normal respiratory effort.  Cardiovascular: Regular rate and rhythm, no murmurs / rubs / gallops. No LE edema. Abdomen: no tenderness. Bowel sounds positive.  Musculoskeletal: no clubbing / cyanosis. N Skin: no rashes, lesions, ulcers. No induration Neurologic: CN 2-12 grossly intact. Strength 5/5 in all 4.  Psychiatric: Normal judgment and insight. Alert and oriented x 3. Normal mood.    Data Reviewed: I have independently reviewed following labs and imaging studies   CBC: Recent Labs  Lab 06/14/19 1057 06/14/19 1439 06/15/19 1009 06/16/19 0521 06/17/19 0200  WBC 4.8 4.5 5.1 4.9 5.0  NEUTROABS 2.6  --  3.0  --   --   HGB 14.1 13.5 13.6 11.9* 11.2*  HCT 43.8 43.8 43.5 37.9 36.6  MCV 82.2 84.7 84.3 83.1 85.9  PLT 161.0 165 149* 126* 409*   Basic Metabolic Panel: Recent Labs  Lab 06/14/19 1057 06/14/19 1439 06/15/19 1009 06/16/19 0521 06/17/19 0200  NA 141 139 138 137 138  K 5.4 No hemolysis seen* 4.5 4.3 4.4 4.7  CL 107 106 107 110 109  CO2 21 20* 19* 17* 21*  GLUCOSE 92 89 92 84 87   BUN 26* 27* 35* 29* 32*  CREATININE 1.71* 2.12* 2.14* 1.49* 1.48*  CALCIUM 8.9 8.7* 8.7* 8.1* 7.8*  MG  --   --   --   --  1.7  PHOS  --   --   --   --  2.9   GFR: Estimated Creatinine Clearance: 27.8 mL/min (A) (by C-G formula based on SCr of 1.48 mg/dL (H)). Liver Function Tests: Recent Labs  Lab 06/14/19 1057 06/14/19 1439 06/15/19 1009 06/16/19 0743 06/17/19 0200  AST 20 25 25 24 21   ALT 10 13 13 12 12   ALKPHOS 100 88 87 73 64  BILITOT 0.4 0.7 0.4 0.6 0.3  PROT 7.5 7.6 7.6 6.5 6.5  ALBUMIN 4.0 3.5 3.5 3.0* 3.0*   Recent Labs  Lab 06/14/19 1057 06/14/19 1439 06/15/19 1009  LIPASE 38.0 35 39   No results for input(s): AMMONIA in the last 168 hours. Coagulation Profile: No results for input(s): INR, PROTIME in the  last 168 hours. Cardiac Enzymes: Recent Labs  Lab 06/16/19 0743  CKTOTAL 240*  CKMB 5.3*   BNP (last 3 results) No results for input(s): PROBNP in the last 8760 hours. HbA1C: No results for input(s): HGBA1C in the last 72 hours. CBG: No results for input(s): GLUCAP in the last 168 hours. Lipid Profile: No results for input(s): CHOL, HDL, LDLCALC, TRIG, CHOLHDL, LDLDIRECT in the last 72 hours. Thyroid Function Tests: No results for input(s): TSH, T4TOTAL, FREET4, T3FREE, THYROIDAB in the last 72 hours. Anemia Panel: Recent Labs    06/16/19 0743 06/17/19 0200  FERRITIN 111 108   Urine analysis:    Component Value Date/Time   COLORURINE YELLOW 06/15/2019 1310   APPEARANCEUR CLEAR 06/15/2019 1310   LABSPEC 1.013 06/15/2019 1310   PHURINE 5.0 06/15/2019 1310   GLUCOSEU NEGATIVE 06/15/2019 1310   HGBUR MODERATE (A) 06/15/2019 1310   BILIRUBINUR NEGATIVE 06/15/2019 1310   BILIRUBINUR NEGATIVE 06/14/2019 1232   KETONESUR NEGATIVE 06/15/2019 1310   PROTEINUR 100 (A) 06/15/2019 1310   UROBILINOGEN 0.2 06/14/2019 1232   UROBILINOGEN 1.0 09/27/2014 2044   NITRITE NEGATIVE 06/15/2019 1310   LEUKOCYTESUR LARGE (A) 06/15/2019 1310   Sepsis  Labs: Invalid input(s): PROCALCITONIN, LACTICIDVEN  Recent Results (from the past 240 hour(s))  Blood culture (routine x 2)     Status: None (Preliminary result)   Collection Time: 06/15/19 11:05 AM   Specimen: BLOOD  Result Value Ref Range Status   Specimen Description BLOOD RIGHT ANTECUBITAL  Final   Special Requests   Final    BOTTLES DRAWN AEROBIC AND ANAEROBIC Blood Culture adequate volume   Culture   Final    NO GROWTH 1 DAY Performed at [facility], [address], [place], [place] [address]    Report Status PENDING  Incomplete  Blood culture (routine x 2)     Status: None (Preliminary result)   Collection Time: 06/15/19 11:58 AM   Specimen: BLOOD  Result Value Ref Range Status   Specimen Description BLOOD LEFT ANTECUBITAL  Final   Special Requests   Final    BOTTLES DRAWN AEROBIC AND ANAEROBIC Blood Culture results may not be optimal due to an excessive volume of blood received in culture bottles   Culture   Final    NO GROWTH 1 DAY Performed at [facility], [place] [address] [place], [place] [address]    Report Status PENDING  Incomplete  Culture, Urine     Status: Abnormal   Collection Time: 06/15/19  1:10 PM   Specimen: Urine, Clean Catch  Result Value Ref Range Status   Specimen Description URINE, CLEAN CATCH  Final   Special Requests NONE  Final   Culture (A)  Final    <10,000 COLONIES/mL INSIGNIFICANT GROWTH Performed at [facility], [place] [address] [place], [place] [address]    Report Status 06/16/2019 FINAL  Final  SARS Coronavirus 2 (CEPHEID - Performed in [facility]), Hosp Order     Status: Abnormal   Collection Time: 06/15/19  6:15 PM   Specimen: Nasopharyngeal Swab  Result Value Ref Range Status   SARS Coronavirus 2 POSITIVE (A) NEGATIVE Final    Comment: RESULT CALLED TO, READ BACK BY AND VERIFIED WITH: Marjory Sneddon RN 06/15/19 1925 JDW (NOTE) If result is NEGATIVE SARS-CoV-2 target nucleic acids are NOT DETECTED. The  SARS-CoV-2 RNA is generally detectable in upper and lower  respiratory specimens during the acute phase of infection. The lowest  concentration of SARS-CoV-2 viral copies this assay can detect is 250  copies / mL. A negative result does not preclude SARS-CoV-2 infection  and should not be used as the sole basis for treatment or other  patient management decisions.  A negative result may occur with  improper specimen collection / handling, submission of specimen other  than nasopharyngeal swab, presence of viral mutation(s) within the  areas targeted by this assay, and inadequate number of viral copies  (<250 copies / mL). A negative result must be combined with clinical  observations, patient history, and epidemiological information. If result is POSITIVE SARS-CoV-2 target nucleic acids are DETECTED. The SARS-C oV-2 RNA is generally detectable in upper and lower  respiratory specimens during the acute phase of infection.  Positive  results are indicative of active infection with SARS-CoV-2.  Clinical  correlation with patient history and other diagnostic information is  necessary to determine patient infection status.  Positive results do  not rule out bacterial infection or co-infection with other viruses. If result is PRESUMPTIVE POSTIVE SARS-CoV-2 nucleic acids MAY BE PRESENT.   A presumptive positive result was obtained on the submitted specimen  and confirmed on repeat testing.  While 2019 novel coronavirus  (SARS-CoV-2) nucleic acids may be present in the submitted sample  additional confirmatory testing may be necessary for epidemiological  and / or clinical management purposes  to differentiate between  SARS-CoV-2 and other Sarbecovirus currently known to infect humans.  If clinically indicated additional testing with an alternate test  methodology [phone]) is advised.  The SARS-CoV-2 RNA is generally  detectable in upper and lower respiratory specimens during the acute   phase of infection. The expected result is Negative. Fact Sheet for Patients:  StrictlyIdeas.no Fact Sheet for Healthcare Providers: BankingDealers.co.za This test is not yet approved or cleared by the [place] FDA and has been authorized for detection and/or diagnosis of SARS-CoV-2 by FDA under an Emergency Use Authorization (EUA).  This EUA will remain in effect (meaning this test can be used) for the duration of the COVID-19 declaration under Section 564(b)(1) of the Act, 21 U.S.C. section 360bbb-3(b)(1), unless the authorization is terminated or revoked sooner. Performed at [facility], [place] [address], [place], [place] [address]   MRSA PCR Screening     Status: None   Collection Time: 06/15/19  9:09 PM   Specimen: Nasopharyngeal  Result Value Ref Range Status   MRSA by PCR NEGATIVE NEGATIVE Final    Comment:        The GeneXpert MRSA Assay (FDA approved for NASAL specimens only), is one component of a comprehensive MRSA colonization surveillance program. It is not intended to diagnose MRSA infection nor to guide or monitor treatment for MRSA infections. Performed at [facility], [place] [address] [place], [place] [address]       Radiology Studies: Ct Abdomen Pelvis Wo Contrast  Result Date: 06/15/2019 CLINICAL DATA:  Acute onset generalized abdominal pain. Acute renal insufficiency precludes IV contrast. Personal history of breast cancer post malignant RIGHT lumpectomy in 2004. Surgical history also includes hysterectomy, cholecystectomy. EXAM: CT ABDOMEN AND PELVIS WITHOUT CONTRAST TECHNIQUE: Multidetector CT imaging of the abdomen and pelvis was performed following the standard protocol without IV contrast. COMPARISON:  09/27/2014 and earlier. FINDINGS: Lower chest: Mild dependent atelectasis in the lower lobes. Visualized lung bases otherwise clear. Heart mildly to moderately enlarged. Mitral and aortic  annular calcification. RIGHT coronary artery atherosclerosis. Hepatobiliary: Normal unenhanced appearance of the liver. Surgically  absent gallbladder. No biliary ductal dilation. Pancreas: Approximate 3 mm calcification in the distal body of the pancreas. Approximate 1.7 cm cystic lesion in the proximal tail of the pancreas (series 3, image 20). Remainder of the pancreas normal in appearance. Spleen: Normal unenhanced appearance. Adrenals/Urinary Tract: Mild LEFT adrenal hyperplasia, unchanged from the prior CT. Normal appearing RIGHT adrenal gland. Multiple BILATERAL renal cysts, the largest arising from the LOWER pole of the LEFT kidney measuring approximately 4.5 cm. Indeterminate exophytic mass arising from POSTERIOR mid LEFT kidney, not present on the prior CT. No hydronephrosis. No urinary tract calculi; intrarenal arterial calcifications mimic calculi. Normal appearing urinary bladder. Stomach/Bowel: Stomach normal in appearance for the degree of distention. Normal-appearing small bowel. Entire colon decompressed. Diffuse colonic diverticulosis without evidence of acute diverticulitis. Normal appendix in the RIGHT UPPER pelvis, filled with oral contrast. Vascular/Lymphatic: Severe a aorto-iliofemoral atherosclerosis without evidence of aneurysm. No pathologic lymphadenopathy. Reproductive: Surgically absent uterus. No adnexal masses. Other: Moderate-sized umbilical hernia containing fat. Musculoskeletal: Moderate to severe degenerative changes in both hips and in both sacroiliac joints. Multilevel degenerative disc disease, spondylosis and severe facet degenerative changes throughout the lumbar spine. Osseous demineralization. Severe multifactorial spinal stenosis at L3-4. No acute findings. IMPRESSION: 1. No acute abnormalities involving the abdomen or pelvis. 2. Diffuse colonic diverticulosis without evidence of acute diverticulitis. 3. Indeterminate exophytic mass arising from the mid LEFT kidney, not  present on the prior CT. While this may represent a hyperdense cyst, it measures approximately 40 Hounsfield units which is the same as the normal unenhanced renal parenchyma, and therefore renal cell carcinoma is not excluded. Since the patient has acute renal insufficiency, MRI contrast should not be administered currently; initially, renal ultrasound may be useful in follow-up. 4. Approximate 3 mm calcification in the distal body of the pancreas, likely related to prior pancreatitis. 5. Severe multifactorial spinal stenosis at L3-4. Electronically Signed   By: Evangeline Dakin M.D.   On: 06/15/2019 15:28   Dg Chest Port 1 View  Result Date: 06/16/2019 CLINICAL DATA:  Shortness of breath. Positive for COVID-19 EXAM: PORTABLE CHEST 1 VIEW COMPARISON:  Chest x-rays dated 09/27/2014 in 01/12/2014. FINDINGS: Heart size and mediastinal contours are stable. Coarse interstitial markings are again seen bilaterally indicating some degree of chronic interstitial lung disease. No confluent opacity to suggest superimposed pneumonia. No pleural effusion or pneumothorax seen. No acute appearing osseous abnormality. IMPRESSION: 1. No active disease. No evidence of pneumonia or pulmonary edema. 2. Probable chronic interstitial lung disease. Electronically Signed   By: Franki Cabot M.D.   On: 06/16/2019 08:32    Marzetta Board, MD, PhD [facility]  Contact via  [email or web address]  [place] P: [phone] F: [phone]

## 2019-06-17 NOTE — Progress Notes (Signed)
Dr. Orie Rout called back and discussed pauses in patient's rhythm.  MD gave no new orders and stated he would call CCMD to discuss frequency with tele monitor.

## 2019-06-17 NOTE — Progress Notes (Signed)
Attempted to call daughter with updates on progress and plan of care. No answer. Will try again as availability allows.

## 2019-06-17 NOTE — Progress Notes (Signed)
Paged Dr. Orie Rout via Shea Evans and MD called back.  Made MD aware that patient arrived with IV that wasn't working and that 4 attempts have been made to start IV and that IV team consult was placed. MD aware that steroid has not been given because of no IV access. Will attempt to get another ICU nurse to try if patient will allow.  MD acknowledged and stated "if you can't get an IV it will have to wait until IV team comes in the morning."  RN also asked MD about patient being Med surg status and if tele is indicated.  MD gave order for patient to be Med Surg level of care with cardiac monitor for the next 24 hours.

## 2019-06-18 LAB — CBC
HCT: 37.5 % (ref 36.0–46.0)
Hemoglobin: 11.4 g/dL — ABNORMAL LOW (ref 12.0–15.0)
MCH: 25.9 pg — ABNORMAL LOW (ref 26.0–34.0)
MCHC: 30.4 g/dL (ref 30.0–36.0)
MCV: 85.2 fL (ref 80.0–100.0)
Platelets: 150 10*3/uL (ref 150–400)
RBC: 4.4 MIL/uL (ref 3.87–5.11)
RDW: 13.9 % (ref 11.5–15.5)
WBC: 7.2 10*3/uL (ref 4.0–10.5)
nRBC: 0 % (ref 0.0–0.2)

## 2019-06-18 LAB — BASIC METABOLIC PANEL
Anion gap: 11 (ref 5–15)
BUN: 43 mg/dL — ABNORMAL HIGH (ref 8–23)
CO2: 19 mmol/L — ABNORMAL LOW (ref 22–32)
Calcium: 8.7 mg/dL — ABNORMAL LOW (ref 8.9–10.3)
Chloride: 108 mmol/L (ref 98–111)
Creatinine, Ser: 1.76 mg/dL — ABNORMAL HIGH (ref 0.44–1.00)
GFR calc Af Amer: 31 mL/min — ABNORMAL LOW (ref >60)
GFR calc non Af Amer: 27 mL/min — ABNORMAL LOW (ref >60)
Glucose, Bld: 113 mg/dL — ABNORMAL HIGH (ref 70–99)
Potassium: 5.5 mmol/L — ABNORMAL HIGH (ref 3.5–5.1)
Sodium: 138 mmol/L (ref 135–145)

## 2019-06-18 LAB — COMPREHENSIVE METABOLIC PANEL
ALT: 13 U/L (ref 0–44)
AST: 22 U/L (ref 15–41)
Albumin: 3.2 g/dL — ABNORMAL LOW (ref 3.5–5.0)
Alkaline Phosphatase: 65 U/L (ref 38–126)
Anion gap: 8 (ref 5–15)
BUN: 41 mg/dL — ABNORMAL HIGH (ref 8–23)
CO2: 20 mmol/L — ABNORMAL LOW (ref 22–32)
Calcium: 8.8 mg/dL — ABNORMAL LOW (ref 8.9–10.3)
Chloride: 110 mmol/L (ref 98–111)
Creatinine, Ser: 1.71 mg/dL — ABNORMAL HIGH (ref 0.44–1.00)
GFR calc Af Amer: 32 mL/min — ABNORMAL LOW (ref >60)
GFR calc non Af Amer: 28 mL/min — ABNORMAL LOW (ref >60)
Glucose, Bld: 141 mg/dL — ABNORMAL HIGH (ref 70–99)
Potassium: 6 mmol/L — ABNORMAL HIGH (ref 3.5–5.1)
Sodium: 138 mmol/L (ref 135–145)
Total Bilirubin: <0.1 mg/dL — ABNORMAL LOW (ref 0.3–1.2)
Total Protein: 6.9 g/dL (ref 6.5–8.1)

## 2019-06-18 LAB — PHOSPHORUS: Phosphorus: 3.2 mg/dL (ref 2.5–4.6)

## 2019-06-18 LAB — C-REACTIVE PROTEIN: CRP: 4.5 mg/dL — ABNORMAL HIGH (ref <1.0)

## 2019-06-18 LAB — MAGNESIUM: Magnesium: 1.8 mg/dL (ref 1.7–2.4)

## 2019-06-18 LAB — D-DIMER, QUANTITATIVE: D-Dimer, Quant: 0.78 ug/mL-FEU — ABNORMAL HIGH (ref 0.00–0.50)

## 2019-06-18 LAB — FERRITIN: Ferritin: 100 ng/mL (ref 11–307)

## 2019-06-18 MED ORDER — CLONIDINE HCL 0.1 MG PO TABS: 0.1000 mg | ORAL_TABLET | Freq: Every day | ORAL | Status: DC

## 2019-06-18 MED ORDER — HYDRALAZINE HCL 50 MG PO TABS: 25.0000 mg | ORAL_TABLET | Freq: Four times a day (QID) | ORAL | Status: DC

## 2019-06-18 MED ORDER — HYDRALAZINE HCL 20 MG/ML IJ SOLN
5.0000 mg | INTRAMUSCULAR | Status: DC | PRN
  Administered 2019-06-19: 5 mg via INTRAVENOUS
  Filled 2019-06-18: qty 1

## 2019-06-18 MED ORDER — SODIUM CHLORIDE 0.9 % IV SOLN
INTRAVENOUS | Status: AC
  Administered 2019-06-18: via INTRAVENOUS

## 2019-06-18 MED ORDER — CLONIDINE HCL 0.1 MG PO TABS
0.1000 mg | ORAL_TABLET | Freq: Two times a day (BID) | ORAL | Status: DC
  Administered 2019-06-18: 0.1 mg via ORAL
  Filled 2019-06-18: qty 1

## 2019-06-18 MED ORDER — VERAPAMIL HCL ER 120 MG PO TBCR
120.0000 mg | EXTENDED_RELEASE_TABLET | Freq: Every day | ORAL | Status: DC
  Administered 2019-06-18 – 2019-06-19 (×2): 120 mg via ORAL
  Filled 2019-06-18 (×3): qty 1

## 2019-06-18 MED ORDER — SODIUM POLYSTYRENE SULFONATE 15 GM/60ML PO SUSP
15.0000 g | Freq: Once | ORAL | Status: AC
  Administered 2019-06-18: 15 g via ORAL
  Filled 2019-06-18: qty 60

## 2019-06-18 MED ORDER — MORPHINE SULFATE (PF) 2 MG/ML IV SOLN: 4.0000 mg | Freq: Once | INTRAVENOUS | Status: DC

## 2019-06-18 MED ORDER — SODIUM CHLORIDE 0.9 % IV SOLN: INTRAVENOUS | Status: AC

## 2019-06-18 MED ORDER — HYDRALAZINE HCL 50 MG PO TABS
25.0000 mg | ORAL_TABLET | Freq: Three times a day (TID) | ORAL | Status: DC
  Administered 2019-06-19: 25 mg via ORAL
  Filled 2019-06-18: qty 1

## 2019-06-18 NOTE — Evaluation (Signed)
Physical Therapy Evaluation Patient Details Name: Linda [name] MRN: [id number] DOB: 18-Nov-1936 Today's Date: 06/18/2019   History of Present Illness  Pt adm with abdominal pain and found to be covid positive. GI consult recommended outpatient follow-up. PMH - CAD, diverticulitis, pancreatitis, reflux, hypertension, PVD, breast cancer   Clinical Impression  Pt presents to PT with unsteady gait due to weakness and decr balance after several days of limited activity due to illness and hospitalization. Pt will need a rolling walker vs a rollator (will assess which one serves her better next treatment). Would also recommend initial assist for mobility at home along with HHPT.     Follow Up Recommendations Home health PT;Supervision for mobility/OOB    Equipment Recommendations  Rolling walker with 5" wheels;Other (comment)(vs rollator)    Recommendations for Other Services       Precautions / Restrictions Restrictions Weight Bearing Restrictions: No      Mobility  Bed Mobility               General bed mobility comments: Pt sitting on EOB.  Transfers Overall transfer level: Needs assistance Equipment used: Straight cane;Rolling walker (2 wheeled) Transfers: Sit to/from [facility] Sit to Stand: Min assist Stand pivot transfers: Min assist       General transfer comment: Assist for balance. Increased time to rise.   Ambulation/Gait Ambulation/Gait assistance: Min assist Gait Distance (Feet): 25 Feet Assistive device: Straight cane;Rolling walker (2 wheeled) Gait Pattern/deviations: Step-through pattern;Decreased step length - right;Decreased step length - left;Shuffle;Trunk flexed Gait velocity: decr Gait velocity interpretation: <1.31 ft/sec, indicative of household ambulator General Gait Details: Assist for balance and support. Pt initially amb with cane with unsteady gait. Then used rolling walker with some improvement but still requiring min assist.  Amb on RA with SpO2 >94%.  Stairs            Wheelchair Mobility    Modified Rankin (Stroke Patients Only)       Balance Overall balance assessment: Needs assistance Sitting-balance support: No upper extremity supported;Feet supported Sitting balance-Leahy Scale: Good     Standing balance support: Single extremity supported;Bilateral upper extremity supported Standing balance-Leahy Scale: Poor Standing balance comment: UE support and min assist for static standing                             Pertinent Vitals/Pain Pain Assessment: No/denies pain    Home Living Family/patient expects to be discharged to:: Private residence Living Arrangements: Children Available Help at Discharge: Family;Available PRN/intermittently(daughter works evenings) Type of Home: House Home Access: Level entry     Home Layout: One level Home Equipment: Cane - single point      Prior Function Level of Independence: Needs assistance   Gait / Transfers Assistance Needed: modified independent with cane  ADL's / Homemaking Assistance Needed: modified independent with dressing/bathing. Daughter does cooking, Nurse, children's Dominance        Extremity/Trunk Assessment   Upper Extremity Assessment Upper Extremity Assessment: Defer to OT evaluation    Lower Extremity Assessment Lower Extremity Assessment: Generalized weakness       Communication   Communication: No difficulties  Cognition Arousal/Alertness: Awake/alert Behavior During Therapy: WFL for tasks assessed/performed Overall Cognitive Status: Within Functional Limits for tasks assessed  General Comments      Exercises     Assessment/Plan    PT Assessment Patient needs continued PT services  PT Problem List Decreased strength;Decreased activity tolerance;Decreased balance;Decreased mobility;Decreased knowledge of use of DME       PT  Treatment Interventions DME instruction;Gait training;Functional mobility training;Therapeutic activities;Therapeutic exercise;Balance training;Patient/family education    PT Goals (Current goals can be found in the Care Plan section)  Acute Rehab PT Goals Patient Stated Goal: return home PT Goal Formulation: With patient Time For Goal Achievement: 07/02/19 Potential to Achieve Goals: Good    Frequency Min 3X/week   Barriers to discharge Decreased caregiver support daughter works    Co-evaluation               AM-PAC PT "6 Clicks" Mobility  Outcome Measure Help needed turning from your back to your side while in a flat bed without using bedrails?: None Help needed moving from lying on your back to sitting on the side of a flat bed without using bedrails?: A Little Help needed moving to and from a bed to a chair (including a wheelchair)?: A Little Help needed standing up from a chair using your arms (e.g., wheelchair or bedside chair)?: A Little Help needed to walk in hospital room?: A Little Help needed climbing 3-5 steps with a railing? : A Lot 6 Click Score: 18    End of Session   Activity Tolerance: Patient tolerated treatment well Patient left: in chair;with call bell/phone within reach;with chair alarm set Nurse Communication: Mobility status PT Visit Diagnosis: Unsteadiness on feet (R26.81);Muscle weakness (generalized) (M62.81)    Time: 8309-4076 PT Time Calculation (min) (ACUTE ONLY): 34 min   Charges:   PT Evaluation $PT Eval Moderate Complexity: 1 Mod PT Treatments $Gait Training: 8-22 mins        [place] Pager [phone] Office [place] 06/18/2019, 1:35 PM

## 2019-06-18 NOTE — Consult Note (Signed)
Informed RN Jarrett Soho that will be coming with PICC RN to place these IVs as soon as possible

## 2019-06-18 NOTE — Progress Notes (Signed)
PROGRESS NOTE  Linda [name] YDX:[id number] DOB: [birth year] DOA: 06/15/2019 PCP: Luetta Nutting, DO   LOS: 2 days   Brief Narrative / Interim history: 83 year old female with coronary artery disease, prior diverticulitis, pancreatitis, reflux, hypertension, PVD, breast cancer was admitted to the hospital on 06/15/2019 due to bilateral lower quadrants abdominal pain for about a week as well as melena for about a day.  He went to the PCP on 7/8, there was concern for diverticulitis and she was started on ciprofloxacin and Flagyl.  PCP ordered a CT scan of the abdomen pelvis however she had worsening pain and decided to come to the ED.  She was found to have a fever of 100.2 and incidentally she tested positive for Covid. Gastroenterology was consulted, evaluated patient, signed off and then she was transferred to [facility] quite weak this morning, fatigued, denies any abdominal pain, nausea or vomiting.  She feels like she does not have much energy today  Assessment & Plan: Active Problems:   Essential hypertension   Carotid artery disease (HCC)   GERD (gastroesophageal reflux disease)   Lower abdominal pain   AKI (acute kidney injury) ([facility])   Abdominal pain   Principal Problem Covid-19 Viral Illness -Patient with increasing fatigue and weakness today, possible developing viremia/viral illness, poor p.o. intake -Continue to closely monitor inflammatory markers, CRP is slightly better but still elevated   COVID-19 Labs  Recent Labs    06/16/19 0743 06/17/19 0200 06/18/19 0350  DDIMER 0.82* 0.71* 0.78*  FERRITIN 111 108 100  LDH 179  --   --   CRP 5.5* 5.0* 4.5*    Lab Results  Component Value Date   SARSCOV2NAA POSITIVE (A) 06/15/2019    Active Problems Abdominal pain/melena -Patient underwent a CT scan of the abdomen and pelvis which did not show any evidence of diverticulitis -Gastroenterology was consulted and evaluated patient while  hospitalized, and GI feels like her abdominal pain may have an ischemic etiology given findings of severe aorto iliofemoral vascular disease on the CT scan. -From gastroenterology standpoint current plans are for outpatient follow-up without any needs for additional testing/treatments right now -No nausea or vomiting, improved, tolerating diet  Acute kidney injury -Patient lost IV access yesterday, was off IV fluids, her creatinine is gotten worse today at 1.7 with hyperkalemia. -Give 1 dose of Kayexalate.  Repeat BMP shows persistent AKI -We will need a midline versus PICC for IV fluids  Pyuria/bacteriuria -No significant UTI type symptoms however patient is a difficult historian but she is persistent and discussing about her abdomen.  Her urine cultures are negative however there is a possibility of a UTI given lower abdominal pain along with a fever.  She received ceftriaxone, currently lost IV access and will change to oral antibiotics for total of 5 days, today day 3 of 5  Essential hypertension -Patient with hypotension overnight and this morning, I have changed her clonidine from 0.2 twice daily 2.1 daily starting tomorrow, change to verapamil to 120 nightly instead of 240 as well as changing hydralazine to hold today and resume tomorrow -Hypotension possibly in the setting of viral illness, also contributing to the worsening creatinine  Left renal mass -Noted on CT of the abdomen and pelvis, Dr. Cyndia Skeeters discussed with urology Dr. Alinda Money who recommended outpatient follow-up  History of CAD/PVD -Continue home aspirin, Lipitor  History of breast cancer -Status post lumpectomy  Scheduled Meds: . atorvastatin  40 mg Oral q1800  . cefdinir  300 mg Oral Q12H  . [START ON 06/19/2019] cloNIDine  0.1 mg Oral Daily  . dexamethasone  6 mg Oral Daily  . gabapentin  300 mg Oral Daily  . [START ON 06/19/2019] hydrALAZINE  25 mg Oral QID  . mouth rinse  15 mL Mouth Rinse BID  . pantoprazole  40  mg Oral Daily  . sodium polystyrene  15 g Oral Once  . verapamil  120 mg Oral QHS  . vitamin C  500 mg Oral Daily  . zinc sulfate  220 mg Oral Daily   Continuous Infusions: . sodium chloride     PRN Meds:.acetaminophen **OR** acetaminophen, HYDROcodone-acetaminophen, ondansetron **OR** ondansetron (ZOFRAN) IV, polyethylene glycol  DVT prophylaxis: SCDs Code Status: DNR Family Communication: Discussed with patient's daughter over the phone, April Foor, [phone] Disposition Plan: home when ready   Consultants:   GI  Procedures:   None   Antimicrobials:  Ceftriaxone 7/9 >> 7/11  Cefpodoxime 7/11>>   Objective: Vitals:   06/18/19 0400 06/18/19 0500 06/18/19 0600 06/18/19 0800  BP: 99/60   116/69  Pulse: (!) 45 (!) 44 (!) 48 (!) 58  Resp: (!) 23 13 (!) 21 17  Temp:    97.8 F (36.6 C)  TempSrc:      SpO2: 96% 98% 97% 97%  Weight:      Height:        Intake/Output Summary (Last 24 hours) at 06/18/2019 1056 Last data filed at 06/17/2019 1600 Gross per 24 hour  Intake 480 ml  Output -  Net 480 ml   Filed Weights   06/15/19 1847 06/15/19 2103  Weight: 68 kg 69.2 kg    Examination:  Constitutional: sitting at the edge of the bed eating breakfast, appears weak Eyes: No icterus ENMT: Moist mucous membranes Neck: normal, supple Respiratory: Clear to auscultation bilaterally without wheezing or crackles.  Normal respiratory effort Cardiovascular: Regular rate and rhythm, no murmurs.  No edema Abdomen: Soft, nontender, positive bowel sounds Musculoskeletal: no clubbing / cyanosis.  Skin: No rashes seen Neurologic: No focal deficits, equal strength Psychiatric: Alert and oriented x3   Data Reviewed: I have independently reviewed following labs and imaging studies   CBC: Recent Labs  Lab 06/14/19 1057 06/14/19 1439 06/15/19 1009 06/16/19 0521 06/17/19 0200 06/18/19 0350  WBC 4.8 4.5 5.1 4.9 5.0 7.2  NEUTROABS 2.6  --  3.0  --   --   --   HGB 14.1  13.5 13.6 11.9* 11.2* 11.4*  HCT 43.8 43.8 43.5 37.9 36.6 37.5  MCV 82.2 84.7 84.3 83.1 85.9 85.2  PLT 161.0 165 149* 126* 141* 092   Basic Metabolic Panel: Recent Labs  Lab 06/15/19 1009 06/16/19 0521 06/17/19 0200 06/18/19 0350 06/18/19 0750  NA 138 137 138 138 138  K 4.3 4.4 4.7 6.0* 5.5*  CL 107 110 109 110 108  CO2 19* 17* 21* 20* 19*  GLUCOSE 92 84 87 141* 113*  BUN 35* 29* 32* 41* 43*  CREATININE 2.14* 1.49* 1.48* 1.71* 1.76*  CALCIUM 8.7* 8.1* 7.8* 8.8* 8.7*  MG  --   --  1.7 1.8  --   PHOS  --   --  2.9 3.2  --    GFR: Estimated Creatinine Clearance: 23.4 mL/min (A) (by C-G formula based on SCr of 1.76 mg/dL (H)). Liver Function Tests: Recent Labs  Lab 06/14/19 1439 06/15/19 1009 06/16/19 0743 06/17/19 0200 06/18/19 0350  AST 25 25 24 21 22   ALT 13 13 12  12  13  ALKPHOS 88 87 73 64 65  BILITOT 0.7 0.4 0.6 0.3 <0.1*  PROT 7.6 7.6 6.5 6.5 6.9  ALBUMIN 3.5 3.5 3.0* 3.0* 3.2*   Recent Labs  Lab 06/14/19 1057 06/14/19 1439 06/15/19 1009  LIPASE 38.0 35 39   No results for input(s): AMMONIA in the last 168 hours. Coagulation Profile: No results for input(s): INR, PROTIME in the last 168 hours. Cardiac Enzymes: Recent Labs  Lab 06/16/19 0743  CKTOTAL 240*  CKMB 5.3*   BNP (last 3 results) No results for input(s): PROBNP in the last 8760 hours. HbA1C: No results for input(s): HGBA1C in the last 72 hours. CBG: No results for input(s): GLUCAP in the last 168 hours. Lipid Profile: No results for input(s): CHOL, HDL, LDLCALC, TRIG, CHOLHDL, LDLDIRECT in the last 72 hours. Thyroid Function Tests: No results for input(s): TSH, T4TOTAL, FREET4, T3FREE, THYROIDAB in the last 72 hours. Anemia Panel: Recent Labs    06/17/19 0200 06/18/19 0350  FERRITIN 108 100   Urine analysis:    Component Value Date/Time   COLORURINE YELLOW 06/15/2019 1310   APPEARANCEUR CLEAR 06/15/2019 1310   LABSPEC 1.013 06/15/2019 1310   PHURINE 5.0 06/15/2019 1310    GLUCOSEU NEGATIVE 06/15/2019 1310   HGBUR MODERATE (A) 06/15/2019 1310   BILIRUBINUR NEGATIVE 06/15/2019 1310   BILIRUBINUR NEGATIVE 06/14/2019 1232   KETONESUR NEGATIVE 06/15/2019 1310   PROTEINUR 100 (A) 06/15/2019 1310   UROBILINOGEN 0.2 06/14/2019 1232   UROBILINOGEN 1.0 09/27/2014 2044   NITRITE NEGATIVE 06/15/2019 1310   LEUKOCYTESUR LARGE (A) 06/15/2019 1310   Sepsis Labs: Invalid input(s): PROCALCITONIN, LACTICIDVEN  Recent Results (from the past 240 hour(s))  Blood culture (routine x 2)     Status: None (Preliminary result)   Collection Time: 06/15/19 11:05 AM   Specimen: BLOOD  Result Value Ref Range Status   Specimen Description BLOOD RIGHT ANTECUBITAL  Final   Special Requests   Final    BOTTLES DRAWN AEROBIC AND ANAEROBIC Blood Culture adequate volume   Culture   Final    NO GROWTH 2 DAYS Performed at [facility], [address] [place], [place] [address]    Report Status PENDING  Incomplete  Blood culture (routine x 2)     Status: None (Preliminary result)   Collection Time: 06/15/19 11:58 AM   Specimen: BLOOD  Result Value Ref Range Status   Specimen Description BLOOD LEFT ANTECUBITAL  Final   Special Requests   Final    BOTTLES DRAWN AEROBIC AND ANAEROBIC Blood Culture results may not be optimal due to an excessive volume of blood received in culture bottles   Culture   Final    NO GROWTH 2 DAYS Performed at [facility], [place] [address], [place], [place] [address]    Report Status PENDING  Incomplete  Culture, Urine     Status: Abnormal   Collection Time: 06/15/19  1:10 PM   Specimen: Urine, Clean Catch  Result Value Ref Range Status   Specimen Description URINE, CLEAN CATCH  Final   Special Requests NONE  Final   Culture (A)  Final    <10,000 COLONIES/mL INSIGNIFICANT GROWTH Performed at [facility], [place] [address] [place],  [address]    Report Status 06/16/2019 FINAL  Final  SARS Coronavirus 2 (CEPHEID - Performed in  [facility] hospital lab), Hosp Order     Status: Abnormal   Collection Time: 06/15/19  6:15 PM   Specimen: Nasopharyngeal Swab  Result Value Ref Range Status   SARS Coronavirus 2 POSITIVE (A) NEGATIVE Final    Comment: RESULT CALLED TO, READ BACK BY AND VERIFIED WITH: Marjory Sneddon RN 06/15/19 1925 JDW (NOTE) If result is NEGATIVE SARS-CoV-2 target nucleic acids are NOT DETECTED. The SARS-CoV-2 RNA is generally detectable in upper and lower  respiratory specimens during the acute phase of infection. The lowest  concentration of SARS-CoV-2 viral copies this assay can detect is 250  copies / mL. A negative result does not preclude SARS-CoV-2 infection  and should not be used as the sole basis for treatment or other  patient management decisions.  A negative result may occur with  improper specimen collection / handling, submission of specimen other  than nasopharyngeal swab, presence of viral mutation(s) within the  areas targeted by this assay, and inadequate number of viral copies  (<250 copies / mL). A negative result must be combined with clinical  observations, patient history, and epidemiological information. If result is POSITIVE SARS-CoV-2 target nucleic acids are DETECTED. The SARS-C oV-2 RNA is generally detectable in upper and lower  respiratory specimens during the acute phase of infection.  Positive  results are indicative of active infection with SARS-CoV-2.  Clinical  correlation with patient history and other diagnostic information is  necessary to determine patient infection status.  Positive results do  not rule out bacterial infection or co-infection with other viruses. If result is PRESUMPTIVE POSTIVE SARS-CoV-2 nucleic acids MAY BE PRESENT.   A presumptive positive result was obtained on the submitted specimen  and confirmed on repeat testing.  While 2019 novel coronavirus  (SARS-CoV-2) nucleic acids may be present in the submitted sample  additional confirmatory  testing may be necessary for epidemiological  and / or clinical management purposes  to differentiate between  SARS-CoV-2 and other Sarbecovirus currently known to infect humans.  If clinically indicated additional testing with an alternate test  methodology [phone]) is advised.  The SARS-CoV-2 RNA is generally  detectable in upper and lower respiratory specimens during the acute  phase of infection. The expected result is Negative. Fact Sheet for Patients:  StrictlyIdeas.no Fact Sheet for Healthcare Providers: BankingDealers.co.za This test is not yet approved or cleared by the [place] FDA and has been authorized for detection and/or diagnosis of SARS-CoV-2 by FDA under an Emergency Use Authorization (EUA).  This EUA will remain in effect (meaning this test can be used) for the duration of the COVID-19 declaration under Section 564(b)(1) of the Act, 21 U.S.C. section 360bbb-3(b)(1), unless the authorization is terminated or revoked sooner. Performed at [facility], [place] [address], [place], [place] [address]   MRSA PCR Screening     Status: None   Collection Time: 06/15/19  9:09 PM   Specimen: Nasopharyngeal  Result Value Ref Range Status   MRSA by PCR NEGATIVE NEGATIVE Final    Comment:        The GeneXpert MRSA Assay (FDA approved for NASAL specimens only), is one component of a comprehensive MRSA colonization surveillance program. It is not intended to diagnose MRSA infection nor to guide or monitor treatment for MRSA infections. Performed at [facility], [place] [address] [place], [place] [address]       Radiology Studies: No results found.  Marzetta Board, MD, PhD [facility]  Contact via  [email or web address]  [place] P: [phone] F: [phone]

## 2019-06-19 ENCOUNTER — Other Ambulatory Visit: Payer: Self-pay

## 2019-06-19 ENCOUNTER — Inpatient Hospital Stay (HOSPITAL_COMMUNITY): Payer: Medicare Other

## 2019-06-19 LAB — COMPREHENSIVE METABOLIC PANEL
ALT: 14 U/L (ref 0–44)
AST: 18 U/L (ref 15–41)
Albumin: 3 g/dL — ABNORMAL LOW (ref 3.5–5.0)
Alkaline Phosphatase: 62 U/L (ref 38–126)
Anion gap: 8 (ref 5–15)
BUN: 39 mg/dL — ABNORMAL HIGH (ref 8–23)
CO2: 20 mmol/L — ABNORMAL LOW (ref 22–32)
Calcium: 8.3 mg/dL — ABNORMAL LOW (ref 8.9–10.3)
Chloride: 113 mmol/L — ABNORMAL HIGH (ref 98–111)
Creatinine, Ser: 1.34 mg/dL — ABNORMAL HIGH (ref 0.44–1.00)
GFR calc Af Amer: 43 mL/min — ABNORMAL LOW (ref >60)
GFR calc non Af Amer: 37 mL/min — ABNORMAL LOW (ref >60)
Glucose, Bld: 95 mg/dL (ref 70–99)
Potassium: 4.7 mmol/L (ref 3.5–5.1)
Sodium: 141 mmol/L (ref 135–145)
Total Bilirubin: 0.3 mg/dL (ref 0.3–1.2)
Total Protein: 6.7 g/dL (ref 6.5–8.1)

## 2019-06-19 LAB — CBC
HCT: 35.4 % — ABNORMAL LOW (ref 36.0–46.0)
Hemoglobin: 10.8 g/dL — ABNORMAL LOW (ref 12.0–15.0)
MCH: 25.6 pg — ABNORMAL LOW (ref 26.0–34.0)
MCHC: 30.5 g/dL (ref 30.0–36.0)
MCV: 83.9 fL (ref 80.0–100.0)
Platelets: 169 10*3/uL (ref 150–400)
RBC: 4.22 MIL/uL (ref 3.87–5.11)
RDW: 13.9 % (ref 11.5–15.5)
WBC: 8.8 10*3/uL (ref 4.0–10.5)
nRBC: 0 % (ref 0.0–0.2)

## 2019-06-19 LAB — PHOSPHORUS: Phosphorus: 2.5 mg/dL (ref 2.5–4.6)

## 2019-06-19 LAB — FERRITIN: Ferritin: 108 ng/mL (ref 11–307)

## 2019-06-19 LAB — C-REACTIVE PROTEIN: CRP: 2.5 mg/dL — ABNORMAL HIGH (ref <1.0)

## 2019-06-19 LAB — MAGNESIUM: Magnesium: 1.6 mg/dL — ABNORMAL LOW (ref 1.7–2.4)

## 2019-06-19 LAB — D-DIMER, QUANTITATIVE: D-Dimer, Quant: 0.96 ug/mL-FEU — ABNORMAL HIGH (ref 0.00–0.50)

## 2019-06-19 MED ORDER — HYDRALAZINE HCL 50 MG PO TABS
25.0000 mg | ORAL_TABLET | Freq: Four times a day (QID) | ORAL | Status: DC
  Administered 2019-06-19 – 2019-06-20 (×5): 25 mg via ORAL
  Filled 2019-06-19 (×5): qty 1

## 2019-06-19 MED ORDER — CLONIDINE HCL 0.1 MG PO TABS
0.2000 mg | ORAL_TABLET | Freq: Two times a day (BID) | ORAL | Status: DC
  Administered 2019-06-19 – 2019-06-20 (×3): 0.2 mg via ORAL
  Filled 2019-06-19 (×3): qty 2

## 2019-06-19 MED ORDER — MAGNESIUM SULFATE 2 GM/50ML IV SOLN
2.0000 g | Freq: Once | INTRAVENOUS | Status: AC
  Administered 2019-06-19: 2 g via INTRAVENOUS
  Filled 2019-06-19: qty 50

## 2019-06-19 NOTE — Progress Notes (Signed)
PROGRESS NOTE  Linda [name] WCH:[id number] DOB: 1936/02/16 DOA: 06/15/2019 PCP: Luetta Nutting, DO   LOS: 3 days   Brief Narrative / Interim history: 83 year old female with coronary artery disease, prior diverticulitis, pancreatitis, reflux, hypertension, PVD, breast cancer was admitted to the hospital on 06/15/2019 due to bilateral lower quadrants abdominal pain for about a week as well as melena for about a day.  He went to the PCP on 7/8, there was concern for diverticulitis and she was started on ciprofloxacin and Flagyl.  PCP ordered a CT scan of the abdomen pelvis however she had worsening pain and decided to come to the ED.  She was found to have a fever of 100.2 and incidentally she tested positive for Covid. Gastroenterology was consulted, evaluated patient, signed off and then she was transferred to [facility]  Subjective: Initially felt well this morning, eating breakfast, however she was quite hypertensive with systolic blood pressure in the 200s, she received IV hydralazine which improved her blood pressure into the 140s however she experienced epigastric/chest pain and discomfort after receiving the hydralazine.  Also reports increased cough in the last 24 hours  Assessment & Plan: Active Problems:   Essential hypertension   Carotid artery disease (HCC)   GERD (gastroesophageal reflux disease)   Lower abdominal pain   AKI (acute kidney injury) ([place])   Abdominal pain   Principal Problem Covid-19 Viral Illness -Without significant respiratory symptoms, inflammatory markers slightly elevated but overall improving.  She did have increased fatigue and weakness yesterday which seems to have gotten a little bit better today -With increased cough, will obtain a chest x-ray today   COVID-19 Labs  Recent Labs    06/17/19 0200 06/18/19 0350 06/19/19 0347  DDIMER 0.71* 0.78* 0.96*  FERRITIN 108 100 108  CRP 5.0* 4.5* 2.5*    Lab Results  Component Value Date    SARSCOV2NAA POSITIVE (A) 06/15/2019    Active Problems Abdominal pain/melena -Patient underwent a CT scan of the abdomen and pelvis which did not show any evidence of diverticulitis -Gastroenterology was consulted and evaluated patient while hospitalized, and GI feels like her abdominal pain may have an ischemic etiology given findings of severe aorto iliofemoral vascular disease on the CT scan. -From gastroenterology standpoint current plans are for outpatient follow-up without any needs for additional testing/treatments right now -No nausea or vomiting, improved, tolerating diet  Chest pain/epigastric pain -After receiving hydralazine and her blood pressure dropped from 200 to 140, EKG nonischemic, supportive treatment for now, unclear etiology but will avoid large blood pressure fluctuations and stop IV hydralazine.  We will increase p.o. hydralazine to prior home dose -Patient also experienced a bout of diarrhea after epigastric pain,?  Cramps  Acute kidney injury with hyperkalemia -Creatinine improving today -Potassium now normalized after Kayexalate yesterday  Pyuria/bacteriuria -No significant UTI type symptoms however patient is a difficult historian but she is persistent and discussing about her abdomen.  Her urine cultures are negative however there is a possibility of a UTI given lower abdominal pain along with a fever.  She received ceftriaxone, currently lost IV access and will change to oral antibiotics for total of 5 days, today day 4 of 5  Essential hypertension -She had an episode of hypotension on 7/12 a.m., her blood pressure medications have been held but now hypertensive and will resume home dose  Left renal mass -Noted on CT of the abdomen and pelvis, Dr. Cyndia Skeeters discussed with urology Dr. Alinda Money who recommended outpatient follow-up  History of CAD/PVD -Continue home aspirin, Lipitor  History of breast cancer -Status post lumpectomy  Scheduled Meds: .  atorvastatin  40 mg Oral q1800  . cefdinir  300 mg Oral Q12H  . cloNIDine  0.2 mg Oral BID  . dexamethasone  6 mg Oral Daily  . gabapentin  300 mg Oral Daily  . hydrALAZINE  25 mg Oral Q6H  . mouth rinse  15 mL Mouth Rinse BID  . pantoprazole  40 mg Oral Daily  . verapamil  120 mg Oral QHS  . vitamin C  500 mg Oral Daily  . zinc sulfate  220 mg Oral Daily   Continuous Infusions:  PRN Meds:.acetaminophen **OR** acetaminophen, hydrALAZINE, HYDROcodone-acetaminophen, ondansetron **OR** ondansetron (ZOFRAN) IV, polyethylene glycol  DVT prophylaxis: SCDs Code Status: DNR Family Communication: Discussed with patient's daughter over the phone, April Vink, [phone] Disposition Plan: home when ready   Consultants:   GI  Procedures:   None   Antimicrobials:  Ceftriaxone 7/9 >> 7/11  Cefpodoxime 7/11>>   Objective: Vitals:   06/18/19 2000 06/18/19 2200 06/19/19 0334 06/19/19 0810  BP: (!) 197/64  (!) 168/67 (!) 213/76  Pulse: 85 73 64 82  Resp: 19 20 (!) 21 15  Temp: 98.5 F (36.9 C)  98.6 F (37 C) 98.3 F (36.8 C)  TempSrc: Oral  Oral   SpO2: 94% 92% 94% 96%  Weight:      Height:        Intake/Output Summary (Last 24 hours) at 06/19/2019 1034 Last data filed at 06/19/2019 0400 Gross per 24 hour  Intake 1310 ml  Output 1151 ml  Net 159 ml   Filed Weights   06/15/19 1847 06/15/19 2103  Weight: 68 kg 69.2 kg    Examination:  Constitutional: Sitting in the chair, no complaints but some weakness present Eyes: No scleral icterus ENMT: Moist mixed membranes Neck: normal, supple Respiratory: Remains clear to auscultation without wheezing or crackles, normal respiratory effort Cardiovascular: Regular rate and rhythm, no peripheral edema Abdomen: Nontender, nondistended, positive bowel sounds Musculoskeletal: no clubbing / cyanosis.  Skin: No new rashes Neurologic: Nonfocal, equal strength Psychiatric: Alert and oriented x3   Data Reviewed: I have  independently reviewed following labs and imaging studies   CBC: Recent Labs  Lab 06/14/19 1057  06/15/19 1009 06/16/19 0521 06/17/19 0200 06/18/19 0350 06/19/19 0347  WBC 4.8   < > 5.1 4.9 5.0 7.2 8.8  NEUTROABS 2.6  --  3.0  --   --   --   --   HGB 14.1   < > 13.6 11.9* 11.2* 11.4* 10.8*  HCT 43.8   < > 43.5 37.9 36.6 37.5 35.4*  MCV 82.2   < > 84.3 83.1 85.9 85.2 83.9  PLT 161.0   < > 149* 126* 141* 150 169   < > = values in this interval not displayed.   Basic Metabolic Panel: Recent Labs  Lab 06/16/19 0521 06/17/19 0200 06/18/19 0350 06/18/19 0750 06/19/19 0347  NA 137 138 138 138 141  K 4.4 4.7 6.0* 5.5* 4.7  CL 110 109 110 108 113*  CO2 17* 21* 20* 19* 20*  GLUCOSE 84 87 141* 113* 95  BUN 29* 32* 41* 43* 39*  CREATININE 1.49* 1.48* 1.71* 1.76* 1.34*  CALCIUM 8.1* 7.8* 8.8* 8.7* 8.3*  MG  --  1.7 1.8  --  1.6*  PHOS  --  2.9 3.2  --  2.5   GFR: Estimated Creatinine Clearance: 30.7 mL/min (A) (  by C-G formula based on SCr of 1.34 mg/dL (H)). Liver Function Tests: Recent Labs  Lab 06/15/19 1009 06/16/19 0743 06/17/19 0200 06/18/19 0350 06/19/19 0347  AST 25 24 21 22 18   ALT 13 12 12 13 14   ALKPHOS 87 73 64 65 62  BILITOT 0.4 0.6 0.3 <0.1* 0.3  PROT 7.6 6.5 6.5 6.9 6.7  ALBUMIN 3.5 3.0* 3.0* 3.2* 3.0*   Recent Labs  Lab 06/14/19 1057 06/14/19 1439 06/15/19 1009  LIPASE 38.0 35 39   No results for input(s): AMMONIA in the last 168 hours. Coagulation Profile: No results for input(s): INR, PROTIME in the last 168 hours. Cardiac Enzymes: Recent Labs  Lab 06/16/19 0743  CKTOTAL 240*  CKMB 5.3*   BNP (last 3 results) No results for input(s): PROBNP in the last 8760 hours. HbA1C: No results for input(s): HGBA1C in the last 72 hours. CBG: No results for input(s): GLUCAP in the last 168 hours. Lipid Profile: No results for input(s): CHOL, HDL, LDLCALC, TRIG, CHOLHDL, LDLDIRECT in the last 72 hours. Thyroid Function Tests: No results for  input(s): TSH, T4TOTAL, FREET4, T3FREE, THYROIDAB in the last 72 hours. Anemia Panel: Recent Labs    06/18/19 0350 06/19/19 0347  FERRITIN 100 108   Urine analysis:    Component Value Date/Time   COLORURINE YELLOW 06/15/2019 1310   APPEARANCEUR CLEAR 06/15/2019 1310   LABSPEC 1.013 06/15/2019 1310   PHURINE 5.0 06/15/2019 1310   GLUCOSEU NEGATIVE 06/15/2019 1310   HGBUR MODERATE (A) 06/15/2019 1310   BILIRUBINUR NEGATIVE 06/15/2019 1310   BILIRUBINUR NEGATIVE 06/14/2019 1232   KETONESUR NEGATIVE 06/15/2019 1310   PROTEINUR 100 (A) 06/15/2019 1310   UROBILINOGEN 0.2 06/14/2019 1232   UROBILINOGEN 1.0 09/27/2014 2044   NITRITE NEGATIVE 06/15/2019 1310   LEUKOCYTESUR LARGE (A) 06/15/2019 1310   Sepsis Labs: Invalid input(s): PROCALCITONIN, LACTICIDVEN  Recent Results (from the past 240 hour(s))  Blood culture (routine x 2)     Status: None (Preliminary result)   Collection Time: 06/15/19 11:05 AM   Specimen: BLOOD  Result Value Ref Range Status   Specimen Description BLOOD RIGHT ANTECUBITAL  Final   Special Requests   Final    BOTTLES DRAWN AEROBIC AND ANAEROBIC Blood Culture adequate volume   Culture   Final    NO GROWTH 3 DAYS Performed at [facility], [address], [place], [place] [address]    Report Status PENDING  Incomplete  Blood culture (routine x 2)     Status: None (Preliminary result)   Collection Time: 06/15/19 11:58 AM   Specimen: BLOOD  Result Value Ref Range Status   Specimen Description BLOOD LEFT ANTECUBITAL  Final   Special Requests   Final    BOTTLES DRAWN AEROBIC AND ANAEROBIC Blood Culture results may not be optimal due to an excessive volume of blood received in culture bottles   Culture   Final    NO GROWTH 3 DAYS Performed at [facility], [place] [address] [place], [place] [address]    Report Status PENDING  Incomplete  Culture, Urine     Status: Abnormal   Collection Time: 06/15/19  1:10 PM   Specimen: Urine, Clean Catch   Result Value Ref Range Status   Specimen Description URINE, CLEAN CATCH  Final   Special Requests NONE  Final   Culture (A)  Final    <10,000 COLONIES/mL INSIGNIFICANT GROWTH Performed at [facility], [place] [address] [place], [place] [address]  Report Status 06/16/2019 FINAL  Final  SARS Coronavirus 2 (CEPHEID - Performed in [place] hospital lab), Hosp Order     Status: Abnormal   Collection Time: 06/15/19  6:15 PM   Specimen: Nasopharyngeal Swab  Result Value Ref Range Status   SARS Coronavirus 2 POSITIVE (A) NEGATIVE Final    Comment: RESULT CALLED TO, READ BACK BY AND VERIFIED WITH: Marjory Sneddon RN 06/15/19 1925 JDW (NOTE) If result is NEGATIVE SARS-CoV-2 target nucleic acids are NOT DETECTED. The SARS-CoV-2 RNA is generally detectable in upper and lower  respiratory specimens during the acute phase of infection. The lowest  concentration of SARS-CoV-2 viral copies this assay can detect is 250  copies / mL. A negative result does not preclude SARS-CoV-2 infection  and should not be used as the sole basis for treatment or other  patient management decisions.  A negative result may occur with  improper specimen collection / handling, submission of specimen other  than nasopharyngeal swab, presence of viral mutation(s) within the  areas targeted by this assay, and inadequate number of viral copies  (<250 copies / mL). A negative result must be combined with clinical  observations, patient history, and epidemiological information. If result is POSITIVE SARS-CoV-2 target nucleic acids are DETECTED. The SARS-C oV-2 RNA is generally detectable in upper and lower  respiratory specimens during the acute phase of infection.  Positive  results are indicative of active infection with SARS-CoV-2.  Clinical  correlation with patient history and other diagnostic information is  necessary to determine patient infection status.  Positive results do  not rule out bacterial infection or  co-infection with other viruses. If result is PRESUMPTIVE POSTIVE SARS-CoV-2 nucleic acids MAY BE PRESENT.   A presumptive positive result was obtained on the submitted specimen  and confirmed on repeat testing.  While 2019 novel coronavirus  (SARS-CoV-2) nucleic acids may be present in the submitted sample  additional confirmatory testing may be necessary for epidemiological  and / or clinical management purposes  to differentiate between  SARS-CoV-2 and other Sarbecovirus currently known to infect humans.  If clinically indicated additional testing with an alternate test  methodology [phone]) is advised.  The SARS-CoV-2 RNA is generally  detectable in upper and lower respiratory specimens during the acute  phase of infection. The expected result is Negative. Fact Sheet for Patients:  StrictlyIdeas.no Fact Sheet for Healthcare Providers: BankingDealers.co.za This test is not yet approved or cleared by the [place] FDA and has been authorized for detection and/or diagnosis of SARS-CoV-2 by FDA under an Emergency Use Authorization (EUA).  This EUA will remain in effect (meaning this test can be used) for the duration of the COVID-19 declaration under Section 564(b)(1) of the Act, 21 U.S.C. section 360bbb-3(b)(1), unless the authorization is terminated or revoked sooner. Performed at [facility], [place] [address], [place], [place] [address]   MRSA PCR Screening     Status: None   Collection Time: 06/15/19  9:09 PM   Specimen: Nasopharyngeal  Result Value Ref Range Status   MRSA by PCR NEGATIVE NEGATIVE Final    Comment:        The GeneXpert MRSA Assay (FDA approved for NASAL specimens only), is one component of a comprehensive MRSA colonization surveillance program. It is not intended to diagnose MRSA infection nor to guide or monitor treatment for MRSA infections. Performed at [facility], [place] [address]  [place], [place] [address]       Radiology Studies:  No results found.  Marzetta Board, MD, PhD [facility]  Contact via  [email or web address]  [employer] P: [phone] F: [phone]

## 2019-06-19 NOTE — Progress Notes (Signed)
PT Cancellation Note  Patient Details Name: Linda [name] MRN: [id number] DOB: 04-Mar-1936   Cancelled Treatment:    Reason Eval/Treat Not Completed: Medical issues which prohibited therapy  Patient undergoing EKG, SBP 213. RN reported need to hold PT at this time.   Jeanie Cooks Jaleyah Longhi, PT 06/19/2019, 9:48 AM

## 2019-06-19 NOTE — Progress Notes (Signed)
OT Cancellation Note  Patient Details Name: Linda [name] MRN: [id number] DOB: 08-20-1936   Cancelled Treatment:    Reason Eval/Treat Not Completed: Patient at procedure or test/ unavailable(getting EKG, RN asked to hold)  [name] 06/19/2019, 9:46 AM   [place] Pager: [phone] Office: [phone]

## 2019-06-20 LAB — COMPREHENSIVE METABOLIC PANEL
ALT: 12 U/L (ref 0–44)
AST: 16 U/L (ref 15–41)
Albumin: 3.1 g/dL — ABNORMAL LOW (ref 3.5–5.0)
Alkaline Phosphatase: 63 U/L (ref 38–126)
Anion gap: 10 (ref 5–15)
BUN: 26 mg/dL — ABNORMAL HIGH (ref 8–23)
CO2: 21 mmol/L — ABNORMAL LOW (ref 22–32)
Calcium: 8.4 mg/dL — ABNORMAL LOW (ref 8.9–10.3)
Chloride: 110 mmol/L (ref 98–111)
Creatinine, Ser: 1.13 mg/dL — ABNORMAL HIGH (ref 0.44–1.00)
GFR calc Af Amer: 53 mL/min — ABNORMAL LOW (ref >60)
GFR calc non Af Amer: 46 mL/min — ABNORMAL LOW (ref >60)
Glucose, Bld: 82 mg/dL (ref 70–99)
Potassium: 4.4 mmol/L (ref 3.5–5.1)
Sodium: 141 mmol/L (ref 135–145)
Total Bilirubin: 0.3 mg/dL (ref 0.3–1.2)
Total Protein: 6.8 g/dL (ref 6.5–8.1)

## 2019-06-20 LAB — CULTURE, BLOOD (ROUTINE X 2)
Culture: NO GROWTH
Culture: NO GROWTH
Special Requests: ADEQUATE

## 2019-06-20 LAB — FERRITIN: Ferritin: 145 ng/mL (ref 11–307)

## 2019-06-20 LAB — CBC
HCT: 36.5 % (ref 36.0–46.0)
Hemoglobin: 11.6 g/dL — ABNORMAL LOW (ref 12.0–15.0)
MCH: 26.2 pg (ref 26.0–34.0)
MCHC: 31.8 g/dL (ref 30.0–36.0)
MCV: 82.4 fL (ref 80.0–100.0)
Platelets: 196 10*3/uL (ref 150–400)
RBC: 4.43 MIL/uL (ref 3.87–5.11)
RDW: 13.8 % (ref 11.5–15.5)
WBC: 8.3 10*3/uL (ref 4.0–10.5)
nRBC: 0 % (ref 0.0–0.2)

## 2019-06-20 LAB — PHOSPHORUS: Phosphorus: 2.4 mg/dL — ABNORMAL LOW (ref 2.5–4.6)

## 2019-06-20 LAB — C-REACTIVE PROTEIN: CRP: 4.2 mg/dL — ABNORMAL HIGH (ref <1.0)

## 2019-06-20 LAB — D-DIMER, QUANTITATIVE: D-Dimer, Quant: 1.69 ug/mL-FEU — ABNORMAL HIGH (ref 0.00–0.50)

## 2019-06-20 LAB — MAGNESIUM: Magnesium: 2 mg/dL (ref 1.7–2.4)

## 2019-06-20 MED ORDER — DEXAMETHASONE 6 MG PO TABS: 6.0000 mg | ORAL_TABLET | Freq: Every day | ORAL | 0 refills | Status: DC

## 2019-06-20 NOTE — Discharge Summary (Signed)
Physician Discharge Summary  Linda [name] XTK:[id number] DOB: [birth year] DOA: 06/15/2019  PCP: Luetta Nutting, DO  Admit date: 06/15/2019 Discharge date: 06/20/2019  Admitted From: home Disposition:  home  Recommendations for Outpatient Follow-up:  1. Follow up with PCP in 1-2 weeks  Home Health: PT Equipment/Devices: walker  Discharge Condition: stable CODE STATUS: DNR Diet recommendation: regular  HPI: Per admitting MD, Linda [name] is a 83 y.o. female with medical history significant of coronary artery disease, diverticulosis, pancreatitis, GERD, hypertension.  Patient resented secondary to a 5 day history of abdominal pain.  Pain is mostly in the left lower quadrant.  She describes it as achy and persistent pain.  She has not taken anything to help with the pain but medications given in the ED have helped with her pain.  No reported sick contacts.  Afebrile at home.  She states that she has had some dark stools but no melena. ED Course: Vitals: T-max of 100.2 F, pulse of 97, respirations 20, BP elevated with last validated BP of 190/91 Labs: CO2 of 19, creatinine of 2.14, calcium of 8.7, hemoglobin of 13.6, platelets of 139 Imaging: CT abdomen/pelvis significant for diverticulosis, left kidney mass, pancreatic calcification, severe spinal stenosis at L3-4 Medications/Course: Morphine, Zofran, 2L NS bolus  Hospital Course: Principal Problem Covid-19 Viral Illness -this most likely represents an incidental finding, patient had no respiratory symptoms on admission, her inflammatory markers were slightly elevated but overall stable.  She did have fatigue and cough on one day which have resolved.  She was afebrile.  She was placed on steroids given slight inflammatory picture and is to complete a total of 10 days  Active Problems Abdominal pain/melena -Patient underwent a CT scan of the abdomen and pelvis which did not show any evidence of diverticulitis. Gastroenterology was consulted  and evaluated patient while hospitalized, and GI feels like her abdominal pain may have an ischemic etiology given findings of severe aorto iliofemoral vascular disease on the CT scan. From gastroenterology standpoint current plans are for outpatient follow-up without any needs for additional testing/treatments right now. No nausea or vomiting, improved, tolerating diet  Acute kidney injury with hyperkalemia -improved with fluids and Kayexalate  Pyuria/bacteriuria -No significant UTI type symptoms however patient is a difficult historian but she is persistent and discussing about her abdomen.  Her urine cultures are negative however there is a possibility of a UTI given lower abdominal pain along with a fever on admission.  She received ceftriaxone initially, then transitioned to cefpodoxime for total of 5 days and finished treatment while hospitalized  Essential hypertension -continue home medications   Left renal mass -Noted on CT of the abdomen and pelvis, Dr. Cyndia Skeeters discussed with urology Dr. Alinda Money who recommended outpatient follow-up  History of CAD/PVD -Continue home aspirin, Lipitor  History of breast cancer -Status post lumpectomy   Discharge Diagnoses:  Active Problems:   Essential hypertension   Carotid artery disease (HCC)   GERD (gastroesophageal reflux disease)   Lower abdominal pain   AKI (acute kidney injury) ([place])   Abdominal pain     Discharge Instructions   Allergies as of 06/20/2019   No Known Allergies     Medication List    STOP taking these medications   ciprofloxacin 500 MG tablet Commonly known as: Cipro   metroNIDAZOLE 500 MG tablet Commonly known as: FLAGYL     TAKE these medications   aspirin EC 81 MG tablet Take 1 tablet (81 mg total) by mouth daily.  atorvastatin 40 MG tablet Commonly known as: LIPITOR TAKE ONE TABLET BY MOUTH EACH EVENING What changed: See the new instructions.   cloNIDine 0.2 MG tablet Commonly known as:  CATAPRES TAKE 1 TABLET BY MOUTH TWICE A DAY   dexamethasone 6 MG tablet Commonly known as: DECADRON Take 1 tablet (6 mg total) by mouth daily. Start taking on: June 21, 2019   Dexilant 60 MG capsule Generic drug: dexlansoprazole TAKE 1 CAPSULE BY MOUTH EVERY DAY What changed: how much to take   gabapentin 300 MG capsule Commonly known as: NEURONTIN TAKE 1 CAPSULE BY MOUTH EVERY DAY What changed: how much to take   hydrALAZINE 25 MG tablet Commonly known as: APRESOLINE TAKE 1 TABLET BY MOUTH FOUR TIMES A DAY   verapamil 120 MG 24 hr capsule Commonly known as: VERELAN PM TAKE 2 CAPSULES EVERY EVENING What changed: See the new instructions.            Durable Medical Equipment  (From admission, onward)         Start     Ordered   06/20/19 1011  For home use only DME 4 wheeled rolling walker with seat  Once    Question:  Patient needs a walker to treat with the following condition  Answer:  Mobility impaired   06/20/19 1014         Consultations:  Gastroenterology   Procedures/Studies:  Ct Abdomen Pelvis Wo Contrast  Result Date: 06/15/2019 CLINICAL DATA:  Acute onset generalized abdominal pain. Acute renal insufficiency precludes IV contrast. Personal history of breast cancer post malignant RIGHT lumpectomy in 2004. Surgical history also includes hysterectomy, cholecystectomy. EXAM: CT ABDOMEN AND PELVIS WITHOUT CONTRAST TECHNIQUE: Multidetector CT imaging of the abdomen and pelvis was performed following the standard protocol without IV contrast. COMPARISON:  09/27/2014 and earlier. FINDINGS: Lower chest: Mild dependent atelectasis in the lower lobes. Visualized lung bases otherwise clear. Heart mildly to moderately enlarged. Mitral and aortic annular calcification. RIGHT coronary artery atherosclerosis. Hepatobiliary: Normal unenhanced appearance of the liver. Surgically absent gallbladder. No biliary ductal dilation. Pancreas: Approximate 3 mm calcification in the  distal body of the pancreas. Approximate 1.7 cm cystic lesion in the proximal tail of the pancreas (series 3, image 20). Remainder of the pancreas normal in appearance. Spleen: Normal unenhanced appearance. Adrenals/Urinary Tract: Mild LEFT adrenal hyperplasia, unchanged from the prior CT. Normal appearing RIGHT adrenal gland. Multiple BILATERAL renal cysts, the largest arising from the LOWER pole of the LEFT kidney measuring approximately 4.5 cm. Indeterminate exophytic mass arising from POSTERIOR mid LEFT kidney, not present on the prior CT. No hydronephrosis. No urinary tract calculi; intrarenal arterial calcifications mimic calculi. Normal appearing urinary bladder. Stomach/Bowel: Stomach normal in appearance for the degree of distention. Normal-appearing small bowel. Entire colon decompressed. Diffuse colonic diverticulosis without evidence of acute diverticulitis. Normal appendix in the RIGHT UPPER pelvis, filled with oral contrast. Vascular/Lymphatic: Severe a aorto-iliofemoral atherosclerosis without evidence of aneurysm. No pathologic lymphadenopathy. Reproductive: Surgically absent uterus. No adnexal masses. Other: Moderate-sized umbilical hernia containing fat. Musculoskeletal: Moderate to severe degenerative changes in both hips and in both sacroiliac joints. Multilevel degenerative disc disease, spondylosis and severe facet degenerative changes throughout the lumbar spine. Osseous demineralization. Severe multifactorial spinal stenosis at L3-4. No acute findings. IMPRESSION: 1. No acute abnormalities involving the abdomen or pelvis. 2. Diffuse colonic diverticulosis without evidence of acute diverticulitis. 3. Indeterminate exophytic mass arising from the mid LEFT kidney, not present on the prior CT. While this may represent a hyperdense  cyst, it measures approximately 40 Hounsfield units which is the same as the normal unenhanced renal parenchyma, and therefore renal cell carcinoma is not excluded.  Since the patient has acute renal insufficiency, MRI contrast should not be administered currently; initially, renal ultrasound may be useful in follow-up. 4. Approximate 3 mm calcification in the distal body of the pancreas, likely related to prior pancreatitis. 5. Severe multifactorial spinal stenosis at L3-4. Electronically Signed   By: Evangeline Dakin M.D.   On: 06/15/2019 15:28   Dg Chest Port 1 View  Result Date: 06/19/2019 CLINICAL DATA:  cough EXAM: PORTABLE CHEST 1 VIEW COMPARISON:  Radiograph June 16, 2019 FINDINGS: Normal cardiac silhouette. Lateral aspect of the LEFT lower lobe there is fine airspace density. No focal consolidation. No pneumothorax. No pleural fluid. Atherosclerotic calcification of the aorta. IMPRESSION: Subtle lateral airspace density in the LEFT lower lobe could represent edema or early pneumonia. Electronically Signed   By: Suzy Bouchard M.D.   On: 06/19/2019 11:46   Dg Chest Port 1 View  Result Date: 06/16/2019 CLINICAL DATA:  Shortness of breath. Positive for COVID-19 EXAM: PORTABLE CHEST 1 VIEW COMPARISON:  Chest x-rays dated 09/27/2014 in 01/12/2014. FINDINGS: Heart size and mediastinal contours are stable. Coarse interstitial markings are again seen bilaterally indicating some degree of chronic interstitial lung disease. No confluent opacity to suggest superimposed pneumonia. No pleural effusion or pneumothorax seen. No acute appearing osseous abnormality. IMPRESSION: 1. No active disease. No evidence of pneumonia or pulmonary edema. 2. Probable chronic interstitial lung disease. Electronically Signed   By: Franki Cabot M.D.   On: 06/16/2019 08:32     Subjective: -On the day of discharge patient is asymptomatic, feels strong, no shortness of breath, no chest pain no abdominal pain, nausea or vomiting.  She is tolerating breakfast without difficulties.  Discharge Exam: BP (!) 163/77   Pulse 63   Temp 98.9 F (37.2 C)   Resp 18   Ht 5\' 3"  (1.6 m)   Wt  69.2 kg   SpO2 93%   BMI 27.02 kg/m   General: Pt is alert, awake, not in acute distress Cardiovascular: RRR, S1/S2 +, no rubs, no gallops Respiratory: CTA bilaterally, no wheezing, no rhonchi Abdominal: Soft, NT, ND, bowel sounds + Extremities: no edema, no cyanosis  The results of significant diagnostics from this hospitalization (including imaging, microbiology, ancillary and laboratory) are listed below for reference.    Microbiology: Recent Results (from the past 240 hour(s))  Blood culture (routine x 2)     Status: None (Preliminary result)   Collection Time: 06/15/19 11:05 AM   Specimen: BLOOD  Result Value Ref Range Status   Specimen Description BLOOD RIGHT ANTECUBITAL  Final   Special Requests   Final    BOTTLES DRAWN AEROBIC AND ANAEROBIC Blood Culture adequate volume   Culture   Final    NO GROWTH 4 DAYS Performed at [facility], [address], [place], [place] [address]    Report Status PENDING  Incomplete  Blood culture (routine x 2)     Status: None (Preliminary result)   Collection Time: 06/15/19 11:58 AM   Specimen: BLOOD  Result Value Ref Range Status   Specimen Description BLOOD LEFT ANTECUBITAL  Final   Special Requests   Final    BOTTLES DRAWN AEROBIC AND ANAEROBIC Blood Culture results may not be optimal due to an excessive volume of blood received in culture bottles   Culture   Final    NO  GROWTH 4 DAYS Performed at [facility], [place] [address] [place], [place] [address]    Report Status PENDING  Incomplete  Culture, Urine     Status: Abnormal   Collection Time: 06/15/19  1:10 PM   Specimen: Urine, Clean Catch  Result Value Ref Range Status   Specimen Description URINE, CLEAN CATCH  Final   Special Requests NONE  Final   Culture (A)  Final    <10,000 COLONIES/mL INSIGNIFICANT GROWTH Performed at [facility], [place] [address], [place], [place] [address]    Report Status 06/16/2019 FINAL  Final  SARS Coronavirus 2 (CEPHEID -  Performed in [place] hospital lab), Hosp Order     Status: Abnormal   Collection Time: 06/15/19  6:15 PM   Specimen: Nasopharyngeal Swab  Result Value Ref Range Status   SARS Coronavirus 2 POSITIVE (A) NEGATIVE Final    Comment: RESULT CALLED TO, READ BACK BY AND VERIFIED WITH: Marjory Sneddon RN 06/15/19 1925 JDW (NOTE) If result is NEGATIVE SARS-CoV-2 target nucleic acids are NOT DETECTED. The SARS-CoV-2 RNA is generally detectable in upper and lower  respiratory specimens during the acute phase of infection. The lowest  concentration of SARS-CoV-2 viral copies this assay can detect is 250  copies / mL. A negative result does not preclude SARS-CoV-2 infection  and should not be used as the sole basis for treatment or other  patient management decisions.  A negative result may occur with  improper specimen collection / handling, submission of specimen other  than nasopharyngeal swab, presence of viral mutation(s) within the  areas targeted by this assay, and inadequate number of viral copies  (<250 copies / mL). A negative result must be combined with clinical  observations, patient history, and epidemiological information. If result is POSITIVE SARS-CoV-2 target nucleic acids are DETECTED. The SARS-C oV-2 RNA is generally detectable in upper and lower  respiratory specimens during the acute phase of infection.  Positive  results are indicative of active infection with SARS-CoV-2.  Clinical  correlation with patient history and other diagnostic information is  necessary to determine patient infection status.  Positive results do  not rule out bacterial infection or co-infection with other viruses. If result is PRESUMPTIVE POSTIVE SARS-CoV-2 nucleic acids MAY BE PRESENT.   A presumptive positive result was obtained on the submitted specimen  and confirmed on repeat testing.  While 2019 novel coronavirus  (SARS-CoV-2) nucleic acids may be present in the submitted sample  additional  confirmatory testing may be necessary for epidemiological  and / or clinical management purposes  to differentiate between  SARS-CoV-2 and other Sarbecovirus currently known to infect humans.  If clinically indicated additional testing with an alternate test  methodology [phone]) is advised.  The SARS-CoV-2 RNA is generally  detectable in upper and lower respiratory specimens during the acute  phase of infection. The expected result is Negative. Fact Sheet for Patients:  StrictlyIdeas.no Fact Sheet for Healthcare Providers: BankingDealers.co.za This test is not yet approved or cleared by the [place] FDA and has been authorized for detection and/or diagnosis of SARS-CoV-2 by FDA under an Emergency Use Authorization (EUA).  This EUA will remain in effect (meaning this test can be used) for the duration of the COVID-19 declaration under Section 564(b)(1) of the Act, 21 U.S.C. section 360bbb-3(b)(1), unless the authorization is terminated or revoked sooner. Performed at [facility], [place] [address], [place], [place] [address]   MRSA PCR Screening  Status: None   Collection Time: 06/15/19  9:09 PM   Specimen: Nasopharyngeal  Result Value Ref Range Status   MRSA by PCR NEGATIVE NEGATIVE Final    Comment:        The GeneXpert MRSA Assay (FDA approved for NASAL specimens only), is one component of a comprehensive MRSA colonization surveillance program. It is not intended to diagnose MRSA infection nor to guide or monitor treatment for MRSA infections. Performed at [facility], [place] [address], [place], [place] [address]    Labs: BNP (last 3 results) Recent Labs    06/16/19 0743  BNP 903.0*   Basic Metabolic Panel: Recent Labs  Lab 06/17/19 0200 06/18/19 0350 06/18/19 0750 06/19/19 0347 06/20/19 0430  NA 138 138 138 141 141  K 4.7 6.0* 5.5* 4.7 4.4  CL 109 110 108 113* 110  CO2 21* 20* 19* 20* 21*   GLUCOSE 87 141* 113* 95 82  BUN 32* 41* 43* 39* 26*  CREATININE 1.48* 1.71* 1.76* 1.34* 1.13*  CALCIUM 7.8* 8.8* 8.7* 8.3* 8.4*  MG 1.7 1.8  --  1.6* 2.0  PHOS 2.9 3.2  --  2.5 2.4*   Liver Function Tests: Recent Labs  Lab 06/16/19 0743 06/17/19 0200 06/18/19 0350 06/19/19 0347 06/20/19 0430  AST 24 21 22 18 16   ALT 12 12 13 14 12   ALKPHOS 73 64 65 62 63  BILITOT 0.6 0.3 <0.1* 0.3 0.3  PROT 6.5 6.5 6.9 6.7 6.8  ALBUMIN 3.0* 3.0* 3.2* 3.0* 3.1*   Recent Labs  Lab 06/14/19 1057 06/14/19 1439 06/15/19 1009  LIPASE 38.0 35 39   No results for input(s): AMMONIA in the last 168 hours. CBC: Recent Labs  Lab 06/14/19 1057  06/15/19 1009 06/16/19 0521 06/17/19 0200 06/18/19 0350 06/19/19 0347 06/20/19 0430  WBC 4.8   < > 5.1 4.9 5.0 7.2 8.8 8.3  NEUTROABS 2.6  --  3.0  --   --   --   --   --   HGB 14.1   < > 13.6 11.9* 11.2* 11.4* 10.8* 11.6*  HCT 43.8   < > 43.5 37.9 36.6 37.5 35.4* 36.5  MCV 82.2   < > 84.3 83.1 85.9 85.2 83.9 82.4  PLT 161.0   < > 149* 126* 141* 150 169 196   < > = values in this interval not displayed.   Cardiac Enzymes: Recent Labs  Lab 06/16/19 0743  CKTOTAL 240*  CKMB 5.3*   BNP: Invalid input(s): POCBNP CBG: No results for input(s): GLUCAP in the last 168 hours. D-Dimer Recent Labs    06/19/19 0347 06/20/19 0430  DDIMER 0.96* 1.69*   Hgb A1c No results for input(s): HGBA1C in the last 72 hours. Lipid Profile No results for input(s): CHOL, HDL, LDLCALC, TRIG, CHOLHDL, LDLDIRECT in the last 72 hours. Thyroid function studies No results for input(s): TSH, T4TOTAL, T3FREE, THYROIDAB in the last 72 hours.  Invalid input(s): FREET3 Anemia work up Recent Labs    06/19/19 0347 06/20/19 0430  FERRITIN 108 145   Urinalysis    Component Value Date/Time   COLORURINE YELLOW 06/15/2019 1310   APPEARANCEUR CLEAR 06/15/2019 1310   LABSPEC 1.013 06/15/2019 1310   PHURINE 5.0 06/15/2019 1310   GLUCOSEU NEGATIVE 06/15/2019 1310    HGBUR MODERATE (A) 06/15/2019 1310   BILIRUBINUR NEGATIVE 06/15/2019 1310   BILIRUBINUR NEGATIVE 06/14/2019 1232   KETONESUR NEGATIVE 06/15/2019 1310   PROTEINUR 100 (A) 06/15/2019 1310   UROBILINOGEN 0.2 06/14/2019  1232   UROBILINOGEN 1.0 09/27/2014 2044   NITRITE NEGATIVE 06/15/2019 1310   LEUKOCYTESUR LARGE (A) 06/15/2019 1310   Sepsis Labs Invalid input(s): PROCALCITONIN,  WBC,  LACTICIDVEN  FURTHER DISCHARGE INSTRUCTIONS:   Get Medicines reviewed and adjusted: Please take all your medications with you for your next visit with your Primary MD   Laboratory/radiological data: Please request your Primary MD to go over all hospital tests and procedure/radiological results at the follow up, please ask your Primary MD to get all Hospital records sent to his/her office.   In some cases, they will be blood work, cultures and biopsy results pending at the time of your discharge. Please request that your primary care M.D. goes through all the records of your hospital data and follows up on these results.   Also Note the following: If you experience worsening of your admission symptoms, develop shortness of breath, life threatening emergency, suicidal or homicidal thoughts you must seek medical attention immediately by calling 911 or calling your MD immediately  if symptoms less severe.   You must read complete instructions/literature along with all the possible adverse reactions/side effects for all the Medicines you take and that have been prescribed to you. Take any new Medicines after you have completely understood and accpet all the possible adverse reactions/side effects.    Do not drive when taking Pain medications or sleeping medications (Benzodaizepines)   Do not take more than prescribed Pain, Sleep and Anxiety Medications. It is not advisable to combine anxiety,sleep and pain medications without talking with your primary care practitioner   Special Instructions: If you have smoked  or chewed Tobacco  in the last 2 yrs please stop smoking, stop any regular Alcohol  and or any Recreational drug use.   Wear Seat belts while driving.   Please note: You were cared for by a hospitalist during your hospital stay. Once you are discharged, your primary care physician will handle any further medical issues. Please note that NO REFILLS for any discharge medications will be authorized once you are discharged, as it is imperative that you return to your primary care physician (or establish a relationship with a primary care physician if you do not have one) for your post hospital discharge needs so that they can reassess your need for medications and monitor your lab values.  Time coordinating discharge: 40 minutes  SIGNED:  Marzetta Board, MD, PhD 06/20/2019, 1:50 PM

## 2019-06-20 NOTE — Evaluation (Signed)
Occupational Therapy Evaluation Patient Details Name: Linda [name] MRN: [id number] DOB: 07/21/1936 Today's Date: 06/20/2019    History of Present Illness Pt adm with abdominal pain and found to be covid positive. GI consult recommended outpatient follow-up. PMH - CAD, diverticulitis, pancreatitis, reflux, hypertension, PVD, breast cancer    Clinical Impression   PTA, pt was living with her daughter and was independent with ADLs and simple IADLs; used [facility] for functional mobility. Pt demonstrating near baseline function with decreased activity tolerance as seen by fatigue. Pt Spo2 >90% on RA throughout session. BP elevated (177/105) during mobility but had not had morning BP meds yet; pt asymptomatic. Provided pt with education on use of tub shower seat and EC for ADLs/IADLs; pt verbalized understanding. Pt would benefit from further acute OT to facilitate safe dc. Recommend dc to home once medically stable per physician.      Follow Up Recommendations  No OT follow up;Supervision/Assistance - 24 hour    Equipment Recommendations  Other (comment)(Recommended a shower chair - provided pt with information)    Recommendations for Other Services PT consult     Precautions / Restrictions Precautions Precautions: Fall Restrictions Weight Bearing Restrictions: No      Mobility Bed Mobility               General bed mobility comments: Pt sitting on EOB.  Transfers Overall transfer level: Needs assistance Equipment used: Quad cane Transfers: Sit to/from Stand Sit to Stand: Min guard;Supervision         General transfer comment: no assist for balance required; standing from recliner x 3    Balance Overall balance assessment: Needs assistance Sitting-balance support: No upper extremity supported;Feet supported Sitting balance-Leahy Scale: Good     Standing balance support: Single extremity supported Standing balance-Leahy Scale: Poor Standing balance comment: very light  support via quad cane                           ADL either performed or assessed with clinical judgement   ADL Overall ADL's : Needs assistance/impaired Eating/Feeding: Set up;Supervision/ safety;Sitting   Grooming: Min guard;Standing   Upper Body Bathing: Set up;Supervision/ safety;Sitting   Lower Body Bathing: Min guard;Sit to/from stand   Upper Body Dressing : Set up;Supervision/safety;Sitting   Lower Body Dressing: Min guard;Sit to/from stand   Toilet Transfer: Min guard;Ambulation(Cane; simulated to recliner)           Functional mobility during ADLs: Min guard;Cane General ADL Comments: Pt demonstrating decreased activity tolerance with fatigue but very motivated to participate in therapy.      Vision Baseline Vision/History: Wears glasses Wears Glasses: At all times;Reading only Patient Visual Report: No change from baseline       Perception     Praxis      Pertinent Vitals/Pain Pain Assessment: No/denies pain     Hand Dominance Right   Extremity/Trunk Assessment Upper Extremity Assessment Upper Extremity Assessment: Generalized weakness   Lower Extremity Assessment Lower Extremity Assessment: Defer to PT evaluation   Cervical / Trunk Assessment Cervical / Trunk Assessment: Kyphotic   Communication Communication Communication: No difficulties   Cognition Arousal/Alertness: Awake/alert Behavior During Therapy: WFL for tasks assessed/performed Overall Cognitive Status: Within Functional Limits for tasks assessed                                     General Comments  Patient had not yet had her BP meds for a.m. Seated BP 199/81, standing 183/85; after short walk 177/105. Pt asymptomatic throughout. SpO2 >90% on RA    Exercises     Shoulder Instructions      Home Living Family/patient expects to be discharged to:: Private residence Living Arrangements: Children Available Help at Discharge: Family;Available  PRN/intermittently(daughter works evenings) Type of Home: House Home Access: Level entry     Home Layout: One level     Bathroom Shower/Tub: Teacher, early years/pre: Standard     Home Equipment: Cane - quad          Prior Functioning/Environment Level of Independence: Needs assistance  Gait / Transfers Assistance Needed: modified independent with cane ADL's / Homemaking Assistance Needed: modified independent with dressing/bathing. Daughter does cooking, cleaning            OT Problem List: Decreased strength;Decreased activity tolerance;Decreased range of motion;Impaired balance (sitting and/or standing);Decreased knowledge of use of DME or AE;Decreased knowledge of precautions      OT Treatment/Interventions: Self-care/ADL training;Therapeutic exercise;Energy conservation;DME and/or AE instruction;Therapeutic activities;Patient/family education    OT Goals(Current goals can be found in the care plan section) Acute Rehab OT Goals Patient Stated Goal: return home OT Goal Formulation: With patient Time For Goal Achievement: 07/04/19 Potential to Achieve Goals: Good  OT Frequency: Min 2X/week   Barriers to D/C:            Co-evaluation PT/OT/SLP Co-Evaluation/Treatment: Yes Reason for Co-Treatment: To address functional/ADL transfers PT goals addressed during session: Mobility/safety with mobility;Proper use of DME OT goals addressed during session: ADL's and self-care      AM-PAC OT "6 Clicks" Daily Activity     Outcome Measure Help from another person eating meals?: None Help from another person taking care of personal grooming?: A Little Help from another person toileting, which includes using toliet, bedpan, or urinal?: A Little Help from another person bathing (including washing, rinsing, drying)?: A Little Help from another person to put on and taking off regular upper body clothing?: None Help from another person to put on and taking off  regular lower body clothing?: A Little 6 Click Score: 20   End of Session Equipment Utilized During Treatment: Kasandra Knudsen) Nurse Communication: Mobility status  Activity Tolerance: Patient tolerated treatment well Patient left: in chair;with call bell/phone within reach  OT Visit Diagnosis: Unsteadiness on feet (R26.81);Other abnormalities of gait and mobility (R26.89);Muscle weakness (generalized) (M62.81)                Time: 5397-6734 OT Time Calculation (min): 26 min Charges:  OT General Charges $OT Visit: 1 Visit OT Evaluation $OT Eval Low Complexity: [place], OTR/L Acute Rehab Pager: [phone] Office: [place] 06/20/2019, 10:37 AM

## 2019-06-20 NOTE — Discharge Instructions (Signed)
Follow with Luetta Nutting, DO in 5-7 days  Please get a complete blood count and chemistry panel checked by your Primary MD at your next visit, and again as instructed by your Primary MD. Please get your medications reviewed and adjusted by your Primary MD.  Please request your Primary MD to go over all Hospital Tests and Procedure/Radiological results at the follow up, please get all Hospital records sent to your Prim MD by signing hospital release before you go home.  In some cases, there will be blood work, cultures and biopsy results pending at the time of your discharge. Please request that your primary care M.D. goes through all the records of your hospital data and follows up on these results.  If you had Pneumonia of Lung problems at the Hospital: Please get a 2 view Chest X ray done in 6-8 weeks after hospital discharge or sooner if instructed by your Primary MD.  If you have Congestive Heart Failure: Please call your Cardiologist or Primary MD anytime you have any of the following symptoms:  1) 3 pound weight gain in 24 hours or 5 pounds in 1 week  2) shortness of breath, with or without a dry hacking cough  3) swelling in the hands, feet or stomach  4) if you have to sleep on extra pillows at night in order to breathe  Follow cardiac low salt diet and 1.5 lit/day fluid restriction.  If you have diabetes Accuchecks 4 times/day, Once in AM empty stomach and then before each meal. Log in all results and show them to your primary doctor at your next visit. If any glucose reading is under 80 or above 300 call your primary MD immediately.  If you have Seizure/Convulsions/Epilepsy: Please do not drive, operate heavy machinery, participate in activities at heights or participate in high speed sports until you have seen by Primary MD or a Neurologist and advised to do so again. Per [facility] statutes, patients with seizures are not allowed to drive until they have been  seizure-free for six months.  Use caution when using heavy equipment or power tools. Avoid working on ladders or at heights. Take showers instead of baths. Ensure the water temperature is not too high on the home water heater. Do not go swimming alone. Do not lock yourself in a room alone (i.e. bathroom). When caring for infants or small children, sit down when holding, feeding, or changing them to minimize risk of injury to the child in the event you have a seizure. Maintain good sleep hygiene. Avoid alcohol.   If you had Gastrointestinal Bleeding: Please ask your Primary MD to check a complete blood count within one week of discharge or at your next visit. Your endoscopic/colonoscopic biopsies that are pending at the time of discharge, will also need to followed by your Primary MD.  Get Medicines reviewed and adjusted. Please take all your medications with you for your next visit with your Primary MD  Please request your Primary MD to go over all hospital tests and procedure/radiological results at the follow up, please ask your Primary MD to get all Hospital records sent to his/her office.  If you experience worsening of your admission symptoms, develop shortness of breath, life threatening emergency, suicidal or homicidal thoughts you must seek medical attention immediately by calling 911 or calling your MD immediately  if symptoms less severe.  You must read complete instructions/literature along with all the possible adverse reactions/side effects for all the Medicines you take  and that have been prescribed to you. Take any new Medicines after you have completely understood and accpet all the possible adverse reactions/side effects.   Do not drive or operate heavy machinery when taking Pain medications.   Do not take more than prescribed Pain, Sleep and Anxiety Medications  Special Instructions: If you have smoked or chewed Tobacco  in the last 2 yrs please stop smoking, stop any regular  Alcohol  and or any Recreational drug use.  Wear Seat belts while driving.  Please note You were cared for by a hospitalist during your hospital stay. If you have any questions about your discharge medications or the care you received while you were in the hospital after you are discharged, you can call the unit and asked to speak with the hospitalist on call if the hospitalist that took care of you is not available. Once you are discharged, your primary care physician will handle any further medical issues. Please note that NO REFILLS for any discharge medications will be authorized once you are discharged, as it is imperative that you return to your primary care physician (or establish a relationship with a primary care physician if you do not have one) for your aftercare needs so that they can reassess your need for medications and monitor your lab values.  You can reach the hospitalist office at phone [phone] or fax [phone]   If you do not have a primary care physician, you can call [phone] for a physician referral.  Activity: As tolerated with Full fall precautions use walker/cane & assistance as needed    Diet: regular  Disposition Home   COVID-19 COVID-19 is a respiratory infection that is caused by a virus called severe acute respiratory syndrome coronavirus 2 (SARS-CoV-2). The disease is also known as coronavirus disease or novel coronavirus. In some people, the virus may not cause any symptoms. In others, it may cause a serious infection. The infection can get worse quickly and can lead to complications, such as:  Pneumonia, or infection of the lungs.  Acute respiratory distress syndrome or ARDS. This is fluid build-up in the lungs.  Acute respiratory failure. This is a condition in which there is not enough oxygen passing from the lungs to the body.  Sepsis or septic shock. This is a serious bodily reaction to an infection.  Blood clotting problems.  Secondary  infections due to bacteria or fungus. The virus that causes COVID-19 is contagious. This means that it can spread from person to person through droplets from coughs and sneezes (respiratory secretions). What are the causes? This illness is caused by a virus. You may catch the virus by:  Breathing in droplets from an infected person's cough or sneeze.  Touching something, like a table or a doorknob, that was exposed to the virus (contaminated) and then touching your mouth, nose, or eyes. What increases the risk? Risk for infection You are more likely to be infected with this virus if you:  Live in or travel to an area with a COVID-19 outbreak.  Come in contact with a sick person who recently traveled to an area with a COVID-19 outbreak.  Provide care for or live with a person who is infected with COVID-19. Risk for serious illness You are more likely to become seriously ill from the virus if you:  Are 81 years of age or older.  Have a long-term disease that lowers your body's ability to fight infection (immunocompromised).  Live in a nursing home or long-term  care facility.  Have a long-term (chronic) disease such as: ? Chronic lung disease, including chronic obstructive pulmonary disease or asthma ? Heart disease. ? Diabetes. ? Chronic kidney disease. ? Liver disease.  Are obese. What are the signs or symptoms? Symptoms of this condition can range from mild to severe. Symptoms may appear any time from 2 to 14 days after being exposed to the virus. They include:  A fever.  A cough.  Difficulty breathing.  Chills.  Muscle pains.  A sore throat.  Loss of taste or smell. Some people may also have stomach problems, such as nausea, vomiting, or diarrhea. Other people may not have any symptoms of COVID-19. How is this diagnosed? This condition may be diagnosed based on:  Your signs and symptoms, especially if: ? You live in an area with a COVID-19 outbreak. ? You  recently traveled to or from an area where the virus is common. ? You provide care for or live with a person who was diagnosed with COVID-19.  A physical exam.  Lab tests, which may include: ? A nasal swab to take a sample of fluid from your nose. ? A throat swab to take a sample of fluid from your throat. ? A sample of mucus from your lungs (sputum). ? Blood tests.  Imaging tests, which may include, X-rays, CT scan, or ultrasound. How is this treated? At present, there is no medicine to treat COVID-19. Medicines that treat other diseases are being used on a trial basis to see if they are effective against COVID-19. Your health care provider will talk with you about ways to treat your symptoms. For most people, the infection is mild and can be managed at home with rest, fluids, and over-the-counter medicines. Treatment for a serious infection usually takes places in a hospital intensive care unit (ICU). It may include one or more of the following treatments. These treatments are given until your symptoms improve.  Receiving fluids and medicines through an IV.  Supplemental oxygen. Extra oxygen is given through a tube in the nose, a face mask, or a hood.  Positioning you to lie on your stomach (prone position). This makes it easier for oxygen to get into the lungs.  Continuous positive airway pressure (CPAP) or bi-level positive airway pressure (BPAP) machine. This treatment uses mild air pressure to keep the airways open. A tube that is connected to a motor delivers oxygen to the body.  Ventilator. This treatment moves air into and out of the lungs by using a tube that is placed in your windpipe.  Tracheostomy. This is a procedure to create a hole in the neck so that a breathing tube can be inserted.  Extracorporeal membrane oxygenation (ECMO). This procedure gives the lungs a chance to recover by taking over the functions of the heart and lungs. It supplies oxygen to the body and  removes carbon dioxide. Follow these instructions at home: Lifestyle  If you are sick, stay home except to get medical care. Your health care provider will tell you how long to stay home. Call your health care provider before you go for medical care.  Rest at home as told by your health care provider.  Do not use any products that contain nicotine or tobacco, such as cigarettes, e-cigarettes, and chewing tobacco. If you need help quitting, ask your health care provider.  Return to your normal activities as told by your health care provider. Ask your health care provider what activities are safe for you. General  instructions  Take over-the-counter and prescription medicines only as told by your health care provider.  Drink enough fluid to keep your urine pale yellow.  Keep all follow-up visits as told by your health care provider. This is important. How is this prevented?  There is no vaccine to help prevent COVID-19 infection. However, there are steps you can take to protect yourself and others from this virus. To protect yourself:   Do not travel to areas where COVID-19 is a risk. The areas where COVID-19 is reported change often. To identify high-risk areas and travel restrictions, check the CDC travel website: FatFares.com.br  If you live in, or must travel to, an area where COVID-19 is a risk, take precautions to avoid infection. ? Stay away from people who are sick. ? Wash your hands often with soap and water for 20 seconds. If soap and water are not available, use an alcohol-based hand sanitizer. ? Avoid touching your mouth, face, eyes, or nose. ? Avoid going out in public, follow guidance from your state and local health authorities. ? If you must go out in public, wear a cloth face covering or face mask. ? Disinfect objects and surfaces that are frequently touched every day. This may include:  Counters and tables.  Doorknobs and light switches.  Sinks and  faucets.  Electronics, such as phones, remote controls, keyboards, computers, and tablets. To protect others: If you have symptoms of COVID-19, take steps to prevent the virus from spreading to others.  If you think you have a COVID-19 infection, contact your health care provider right away. Tell your health care team that you think you may have a COVID-19 infection.  Stay home. Leave your house only to seek medical care. Do not use public transport.  Do not travel while you are sick.  Wash your hands often with soap and water for 20 seconds. If soap and water are not available, use alcohol-based hand sanitizer.  Stay away from other members of your household. Let healthy household members care for children and pets, if possible. If you have to care for children or pets, wash your hands often and wear a mask. If possible, stay in your own room, separate from others. Use a different bathroom.  Make sure that all people in your household wash their hands well and often.  Cough or sneeze into a tissue or your sleeve or elbow. Do not cough or sneeze into your hand or into the air.  Wear a cloth face covering or face mask. Where to find more information  Centers for Disease Control and Prevention: PurpleGadgets.be  World Health Organization: https://www.castaneda.info/ Contact a health care provider if:  You live in or have traveled to an area where COVID-19 is a risk and you have symptoms of the infection.  You have had contact with someone who has COVID-19 and you have symptoms of the infection. Get help right away if:  You have trouble breathing.  You have pain or pressure in your chest.  You have confusion.  You have bluish lips and fingernails.  You have difficulty waking from sleep.  You have symptoms that get worse. These symptoms may represent a serious problem that is an emergency. Do not wait to see if the symptoms will go away.  Get medical help right away. Call your local emergency services (911 in the [place] Do not drive yourself to the hospital. Let the emergency medical personnel know if you think you have COVID-19. Summary  COVID-19 is a  respiratory infection that is caused by a virus. It is also known as coronavirus disease or novel coronavirus. It can cause serious infections, such as pneumonia, acute respiratory distress syndrome, acute respiratory failure, or sepsis.  The virus that causes COVID-19 is contagious. This means that it can spread from person to person through droplets from coughs and sneezes.  You are more likely to develop a serious illness if you are 43 years of age or older, have a weak immunity, live in a nursing home, or have chronic disease.  There is no medicine to treat COVID-19. Your health care provider will talk with you about ways to treat your symptoms.  Take steps to protect yourself and others from infection. Wash your hands often and disinfect objects and surfaces that are frequently touched every day. Stay away from people who are sick and wear a mask if you are sick. This information is not intended to replace advice given to you by your health care provider. Make sure you discuss any questions you have with your health care provider. Document Released: 12/29/2018 Document Revised: 04/20/2019 Document Reviewed: 12/29/2018 Elsevier Patient Education  2020 [employer]: How to Protect Yourself and Others Know how it spreads  There is currently no vaccine to prevent coronavirus disease 2019 (COVID-19).  The best way to prevent illness is to avoid being exposed to this virus.  The virus is thought to spread mainly from person-to-person. ? Between people who are in close contact with one another (within about 6 feet). ? Through respiratory droplets produced when an infected person coughs, sneezes or talks. ? These droplets can land in the mouths or noses of people who  are nearby or possibly be inhaled into the lungs. ? Some recent studies have suggested that COVID-19 may be spread by people who are not showing symptoms. Everyone should Clean your hands often  Wash your hands often with soap and water for at least 20 seconds especially after you have been in a public place, or after blowing your nose, coughing, or sneezing.  If soap and water are not readily available, use a hand sanitizer that contains at least 60% alcohol. Cover all surfaces of your hands and rub them together until they feel dry.  Avoid touching your eyes, nose, and mouth with unwashed hands. Avoid close contact  Stay home if you are sick.  Avoid close contact with people who are sick.  Put distance between yourself and other people. ? Remember that some people without symptoms may be able to spread virus. ? This is especially important for people who are at higher risk of getting very GainPain.com.cy Cover your mouth and nose with a cloth face cover when around others  You could spread COVID-19 to others even if you do not feel sick.  Everyone should wear a cloth face cover when they have to go out in public, for example to the grocery store or to pick up other necessities. ? Cloth face coverings should not be placed on young children under age 70, anyone who has trouble breathing, or is unconscious, incapacitated or otherwise unable to remove the mask without assistance.  The cloth face cover is meant to protect other people in case you are infected.  Do NOT use a facemask meant for a Dietitian.  Continue to keep about 6 feet between yourself and others. The cloth face cover is not a substitute for social distancing. Cover coughs and sneezes  If you are in a private  setting and do not have on your cloth face covering, remember to always cover your mouth and nose with a tissue when you cough or  sneeze or use the inside of your elbow.  Throw used tissues in the trash.  Immediately wash your hands with soap and water for at least 20 seconds. If soap and water are not readily available, clean your hands with a hand sanitizer that contains at least 60% alcohol. Clean and disinfect  Clean AND disinfect frequently touched surfaces daily. This includes tables, doorknobs, light switches, countertops, handles, desks, phones, keyboards, toilets, faucets, and sinks. RackRewards.fr  If surfaces are dirty, clean them: Use detergent or soap and water prior to disinfection.  Then, use a household disinfectant. You can see a list of EPA-registered household disinfectants here. michellinders.com 04/11/2019 This information is not intended to replace advice given to you by your health care provider. Make sure you discuss any questions you have with your health care provider. Document Released: 03/21/2019 Document Revised: 04/19/2019 Document Reviewed: 03/21/2019 Elsevier Patient Education  2020 [employer].   COVID-19 Frequently Asked Questions COVID-19 (coronavirus disease) is an infection that is caused by a large family of viruses. Some viruses cause illness in people and others cause illness in animals like camels, cats, and bats. In some cases, the viruses that cause illness in animals can spread to humans. Where did the coronavirus come from? In December 2019, [place] told the [facility]) of several cases of lung disease (human respiratory illness). These cases were linked to an open seafood and livestock market in the city of [place]. The link to the seafood and livestock market suggests that the virus may have spread from animals to humans. However, since that first outbreak in December, the virus has also been shown to spread from person to person. What is the name of the disease and the virus? Disease  name Early on, this disease was called novel coronavirus. This is because scientists determined that the disease was caused by a new (novel) respiratory virus. The World Health Organization [facility]) has now named the disease COVID-19, or coronavirus disease. Virus name The virus that causes the disease is called severe acute respiratory syndrome coronavirus 2 (SARS-CoV-2). More information on disease and virus naming World Health Organization [facility]): www.who.int/emergencies/diseases/novel-coronavirus-2019/technical-guidance/naming-the-coronavirus-disease-(covid-2019)-and-the-virus-that-causes-it Who is at risk for complications from coronavirus disease? Some people may be at higher risk for complications from coronavirus disease. This includes older adults and people who have chronic diseases, such as heart disease, diabetes, and lung disease. If you are at higher risk for complications, take these extra precautions:  Avoid close contact with people who are sick or have a fever or cough. Stay at least 3-6 ft (1-2 m) away from them, if possible.  Wash your hands often with soap and water for at least 20 seconds.  Avoid touching your face, mouth, nose, or eyes.  Keep supplies on hand at home, such as food, medicine, and cleaning supplies.  Stay home as much as possible.  Avoid social gatherings and travel. How does coronavirus disease spread? The virus that causes coronavirus disease spreads easily from person to person (is contagious). There are also cases of community-spread disease. This means the disease has spread to:  People who have no known contact with other infected people.  People who have not traveled to areas where there are known cases. It appears to spread from one person to another through droplets from coughing or sneezing. Can I get the virus from touching  surfaces or objects? There is still a lot that we do not know about the virus that causes coronavirus disease. Scientists  are basing a lot of information on what they know about similar viruses, such as:  Viruses cannot generally survive on surfaces for long. They need a human body (host) to survive.  It is more likely that the virus is spread by close contact with people who are sick (direct contact), such as through: ? Shaking hands or hugging. ? Breathing in respiratory droplets that travel through the air. This can happen when an infected person coughs or sneezes on or near other people.  It is less likely that the virus is spread when a person touches a surface or object that has the virus on it (indirect contact). The virus may be able to enter the body if the person touches a surface or object and then touches his or her face, eyes, nose, or mouth. Can a person spread the virus without having symptoms of the disease? It may be possible for the virus to spread before a person has symptoms of the disease, but this is most likely not the main way the virus is spreading. It is more likely for the virus to spread by being in close contact with people who are sick and breathing in the respiratory droplets of a sick person's cough or sneeze. What are the symptoms of coronavirus disease? Symptoms vary from person to person and can range from mild to severe. Symptoms may include:  Fever.  Cough.  Tiredness, weakness, or fatigue.  Fast breathing or feeling short of breath. These symptoms can appear anywhere from 2 to 14 days after you have been exposed to the virus. If you develop symptoms, call your health care provider. People with severe symptoms may need hospital care. If I am exposed to the virus, how long does it take before symptoms start? Symptoms of coronavirus disease may appear anywhere from 2 to 14 days after a person has been exposed to the virus. If you develop symptoms, call your health care provider. Should I be tested for this virus? Your health care provider will decide whether to test you based  on your symptoms, history of exposure, and your risk factors. How does a health care provider test for this virus? Health care providers will collect samples to send for testing. Samples may include:  Taking a swab of fluid from the nose.  Taking fluid from the lungs by having you cough up mucus (sputum) into a sterile cup.  Taking a blood sample.  Taking a stool or urine sample. Is there a treatment or vaccine for this virus? Currently, there is no vaccine to prevent coronavirus disease. Also, there are no medicines like antibiotics or antivirals to treat the virus. A person who becomes sick is given supportive care, which means rest and fluids. A person may also relieve his or her symptoms by using over-the-counter medicines that treat sneezing, coughing, and runny nose. These are the same medicines that a person takes for the common cold. If you develop symptoms, call your health care provider. People with severe symptoms may need hospital care. What can I do to protect myself and my family from this virus?     You can protect yourself and your family by taking the same actions that you would take to prevent the spread of other viruses. Take the following actions:  Wash your hands often with soap and water for at least 20 seconds. If soap  and water are not available, use alcohol-based hand sanitizer.  Avoid touching your face, mouth, nose, or eyes.  Cough or sneeze into a tissue, sleeve, or elbow. Do not cough or sneeze into your hand or the air. ? If you cough or sneeze into a tissue, throw it away immediately and wash your hands.  Disinfect objects and surfaces that you frequently touch every day.  Avoid close contact with people who are sick or have a fever or cough. Stay at least 3-6 ft (1-2 m) away from them, if possible.  Stay home if you are sick, except to get medical care. Call your health care provider before you get medical care.  Make sure your vaccines are up to date.  Ask your health care provider what vaccines you need. What should I do if I need to travel? Follow travel recommendations from your local health authority, the CDC, and WHO. Travel information and advice  Centers for Disease Control and Prevention (CDC): BodyEditor.hu  World Health Organization [facility]): ThirdIncome.ca Know the risks and take action to protect your health  You are at higher risk of getting coronavirus disease if you are traveling to areas with an outbreak or if you are exposed to travelers from areas with an outbreak.  Wash your hands often and practice good hygiene to lower the risk of catching or spreading the virus. What should I do if I am sick? General instructions to stop the spread of infection  Wash your hands often with soap and water for at least 20 seconds. If soap and water are not available, use alcohol-based hand sanitizer.  Cough or sneeze into a tissue, sleeve, or elbow. Do not cough or sneeze into your hand or the air.  If you cough or sneeze into a tissue, throw it away immediately and wash your hands.  Stay home unless you must get medical care. Call your health care provider or local health authority before you get medical care.  Avoid public areas. Do not take public transportation, if possible.  If you can, wear a mask if you must go out of the house or if you are in close contact with someone who is not sick. Keep your home clean  Disinfect objects and surfaces that are frequently touched every day. This may include: ? Counters and tables. ? Doorknobs and light switches. ? Sinks and faucets. ? Electronics such as phones, remote controls, keyboards, computers, and tablets.  Wash dishes in hot, soapy water or use a dishwasher. Air-dry your dishes.  Wash laundry in hot water. Prevent infecting other household members  Let healthy household members  care for children and pets, if possible. If you have to care for children or pets, wash your hands often and wear a mask.  Sleep in a different bedroom or bed, if possible.  Do not share personal items, such as razors, toothbrushes, deodorant, combs, brushes, towels, and washcloths. Where to find more information Centers for Disease Control and Prevention (CDC)  Information and news updates: https://www.butler-gonzalez.com/ World Health Organization [facility])  Information and news updates: MissExecutive.com.ee  Coronavirus health topic: https://www.castaneda.info/  Questions and answers on COVID-19: OpportunityDebt.at  Global tracker: who.sprinklr.com American Academy of Pediatrics (AAP)  Information for families: www.healthychildren.org/English/health-issues/conditions/chest-lungs/Pages/2019-Novel-Coronavirus.aspx The coronavirus situation is changing rapidly. Check your local health authority website or the CDC and [facility] websites for updates and news. When should I contact a health care provider?  Contact your health care provider if you have symptoms of an infection, such as fever or  cough, and you: ? Have been near anyone who is known to have coronavirus disease. ? Have come into contact with a person who is suspected to have coronavirus disease. ? Have traveled outside of the country. When should I get emergency medical care?  Get help right away by calling your local emergency services (911 in the [place] if you have: ? Trouble breathing. ? Pain or pressure in your chest. ? Confusion. ? Blue-tinged lips and fingernails. ? Difficulty waking from sleep. ? Symptoms that get worse. Let the emergency medical personnel know if you think you have coronavirus disease. Summary  A new respiratory virus is spreading from person to person and causing COVID-19 (coronavirus disease).  The virus that causes  COVID-19 appears to spread easily. It spreads from one person to another through droplets from coughing or sneezing.  Older adults and those with chronic diseases are at higher risk of disease. If you are at higher risk for complications, take extra precautions.  There is currently no vaccine to prevent coronavirus disease. There are no medicines, such as antibiotics or antivirals, to treat the virus.  You can protect yourself and your family by washing your hands often, avoiding touching your face, and covering your coughs and sneezes. This information is not intended to replace advice given to you by your health care provider. Make sure you discuss any questions you have with your health care provider. Document Released: 03/21/2019 Document Revised: 03/21/2019 Document Reviewed: 03/21/2019 Elsevier Patient Education  [place].

## 2019-06-20 NOTE — TOC Transition Note (Addendum)
Transition of Care [facility]) - CM/SW Discharge Note   Patient Details  Name: Linda [name] MRN: [id number] Date of Birth: 07-08-1936  [facility] CM/SW Contact:  Maryclare Labrador, RN Phone Number: 06/20/2019, 10:37 AM   Clinical Narrative:   Pt to discharge home today - pt states her daughter lives with her and provides any assistance if needed.  PTA pt states she was independent and only utilized a cane when needed.  Pt has thermometer in the home.  Pt declined HH are recommended - CM informed pt that if she deems [facility] will be helpful once discharged to ask PCP to arrange.  Pt is interested in rollator as ordered but not interested in 3:1.  - CM offered choice and pt chose [facility] contacted and referral accepted -agency will deliver directly to pts home today.  NO other CM needs determined - CM signing off    Final next level of care: Home/Self Care Barriers to Discharge: Barriers Resolved(Unable to find [facility] to accept insurance)   Patient Goals and CMS Choice Patient states their goals for this hospitalization and ongoing recovery are:: Pt states she is ready to get back home      Discharge Placement                       Discharge Plan and Services                          HH Arranged: (Pt refused HH)          Social Determinants of Health (SDOH) Interventions     Readmission Risk Interventions No flowsheet data found.

## 2019-06-20 NOTE — Progress Notes (Signed)
Physical Therapy Treatment Patient Details Name: Linda [name] MRN: [id number] DOB: 07-Jul-1936 Today's Date: 06/20/2019    History of Present Illness Pt adm with abdominal pain and found to be covid positive. GI consult recommended outpatient follow-up. PMH - CAD, diverticulitis, pancreatitis, reflux, hypertension, PVD, breast cancer     PT Comments    Patient able to ambulate in her room with quad cane and no imbalance. She reports she has tried a RW before and "I couldn't do it well." She inquired re: rollator and educated on pro's/con's of rollator and she deferred trying one at this time. SaO2 92-96% on room air. BP elevated with walking (177/105), however has not yet had morning BP meds. Patient asymptomatic.     Follow Up Recommendations  Supervision for mobility/OOB(per pt, daughter coming to stay with her)     Equipment Recommendations  (we discussed pro's/con's of rollator; she wants to wait)    Recommendations for Other Services       Precautions / Restrictions Precautions Precautions: Fall Restrictions Weight Bearing Restrictions: No    Mobility  Bed Mobility                  Transfers Overall transfer level: Needs assistance Equipment used: Quad cane Transfers: Sit to/from Stand Sit to Stand: Min guard;Supervision         General transfer comment: no assist for balance required; standing from recliner x 3  Ambulation/Gait Ambulation/Gait assistance: Min guard;Supervision Gait Distance (Feet): 25 Feet Assistive device: Quad cane Gait Pattern/deviations: Step-through pattern;Decreased step length - right;Decreased step length - left;Trunk flexed Gait velocity: decr   General Gait Details: pt reports she feels more confident with her quad cane and did not display unsteadiness or LOB with use of cane; distance limited due to incr BP   Stairs             Wheelchair Mobility    Modified Rankin (Stroke Patients Only)       Balance  Overall balance assessment: Needs assistance Sitting-balance support: No upper extremity supported;Feet supported Sitting balance-Leahy Scale: Good     Standing balance support: Single extremity supported Standing balance-Leahy Scale: Poor Standing balance comment: very light support via quad cane                            Cognition Arousal/Alertness: Awake/alert Behavior During Therapy: WFL for tasks assessed/performed Overall Cognitive Status: Within Functional Limits for tasks assessed                                        Exercises      General Comments General comments (skin integrity, edema, etc.): Patient had not yet had her BP meds for a.m. Seated BP 199/81, standing 183/85; after short walk 177/105. Pt asymptomatic throughout.       Pertinent Vitals/Pain Pain Assessment: No/denies pain    Home Living             Home Layout: One level Home Equipment: Cane - quad      Prior Function            PT Goals (current goals can now be found in the care plan section) Acute Rehab PT Goals Patient Stated Goal: return home Time For Goal Achievement: 07/02/19 Progress towards PT goals: Progressing toward goals    Frequency    Min 3X/week  PT Plan Discharge plan needs to be updated    Co-evaluation PT/OT/SLP Co-Evaluation/Treatment: Yes Reason for Co-Treatment: To address functional/ADL transfers PT goals addressed during session: Mobility/safety with mobility;Proper use of DME        AM-PAC PT "6 Clicks" Mobility   Outcome Measure  Help needed turning from your back to your side while in a flat bed without using bedrails?: None Help needed moving from lying on your back to sitting on the side of a flat bed without using bedrails?: A Little Help needed moving to and from a bed to a chair (including a wheelchair)?: A Little Help needed standing up from a chair using your arms (e.g., wheelchair or bedside chair)?: A  Little Help needed to walk in hospital room?: A Little Help needed climbing 3-5 steps with a railing? : A Little 6 Click Score: 19    End of Session   Activity Tolerance: Patient tolerated treatment well Patient left: in chair;with call bell/phone within reach   PT Visit Diagnosis: Unsteadiness on feet (R26.81);Muscle weakness (generalized) (M62.81)     Time: 4010-2725 PT Time Calculation (min) (ACUTE ONLY): 25 min  Charges:  $Therapeutic Activity: 8-22 mins                        [employer], PT 06/20/2019, 10:06 AM

## 2019-06-22 ENCOUNTER — Telehealth: Payer: Self-pay | Admitting: Behavioral Health

## 2019-06-22 NOTE — Telephone Encounter (Signed)
Patient declines TCM/Hospital Follow-up at this time. She voiced that if anything changes she will call the office to schedule an appointment with PCP.

## 2019-07-15 ENCOUNTER — Other Ambulatory Visit: Payer: Self-pay | Admitting: Family Medicine

## 2019-07-28 ENCOUNTER — Ambulatory Visit: Payer: Medicare Other | Admitting: Gastroenterology

## 2019-08-03 ENCOUNTER — Other Ambulatory Visit: Payer: Self-pay

## 2019-08-03 MED ORDER — DEXILANT 60 MG PO CPDR: 60.0000 mg | DELAYED_RELEASE_CAPSULE | Freq: Every day | ORAL | 1 refills | Status: DC

## 2019-08-10 ENCOUNTER — Ambulatory Visit: Payer: Medicare Other | Admitting: Family Medicine

## 2019-08-15 ENCOUNTER — Ambulatory Visit (INDEPENDENT_AMBULATORY_CARE_PROVIDER_SITE_OTHER): Payer: Medicare Other | Admitting: Family Medicine

## 2019-08-15 ENCOUNTER — Other Ambulatory Visit: Payer: Self-pay

## 2019-08-15 ENCOUNTER — Encounter: Payer: Self-pay | Admitting: Family Medicine

## 2019-08-15 VITALS — BP 164/80 | HR 88 | Temp 97.9°F | Ht 61.0 in | Wt 150.0 lb

## 2019-08-15 DIAGNOSIS — I10 Essential (primary) hypertension: Secondary | ICD-10-CM

## 2019-08-15 DIAGNOSIS — Z23 Encounter for immunization: Secondary | ICD-10-CM

## 2019-08-15 DIAGNOSIS — K922 Gastrointestinal hemorrhage, unspecified: Secondary | ICD-10-CM

## 2019-08-15 DIAGNOSIS — Z720 Tobacco use: Secondary | ICD-10-CM

## 2019-08-15 DIAGNOSIS — E785 Hyperlipidemia, unspecified: Secondary | ICD-10-CM

## 2019-08-15 DIAGNOSIS — K5909 Other constipation: Secondary | ICD-10-CM

## 2019-08-15 DIAGNOSIS — R1030 Lower abdominal pain, unspecified: Secondary | ICD-10-CM | POA: Diagnosis not present

## 2019-08-15 HISTORY — DX: Hyperlipidemia, unspecified: E78.5

## 2019-08-15 LAB — COMPREHENSIVE METABOLIC PANEL
ALT: 9 U/L (ref 0–35)
AST: 15 U/L (ref 0–37)
Albumin: 3.7 g/dL (ref 3.5–5.2)
Alkaline Phosphatase: 98 U/L (ref 39–117)
BUN: 17 mg/dL (ref 6–23)
CO2: 24 mEq/L (ref 19–32)
Calcium: 9.5 mg/dL (ref 8.4–10.5)
Chloride: 109 mEq/L (ref 96–112)
Creatinine, Ser: 1.05 mg/dL (ref 0.40–1.20)
GFR: 60.77 mL/min (ref >60.00)
Glucose, Bld: 95 mg/dL (ref 70–99)
Potassium: 5.3 mEq/L — ABNORMAL HIGH (ref 3.5–5.1)
Sodium: 141 mEq/L (ref 135–145)
Total Bilirubin: 0.3 mg/dL (ref 0.2–1.2)
Total Protein: 7.1 g/dL (ref 6.0–8.3)

## 2019-08-15 LAB — CBC WITH DIFFERENTIAL/PLATELET
Basophils Absolute: 0.1 10*3/uL (ref 0.0–0.1)
Basophils Relative: 1.2 % (ref 0.0–3.0)
Eosinophils Absolute: 0.2 10*3/uL (ref 0.0–0.7)
Eosinophils Relative: 3.1 % (ref 0.0–5.0)
HCT: 38.2 % (ref 36.0–46.0)
Hemoglobin: 12.2 g/dL (ref 12.0–15.0)
Lymphocytes Relative: 23.7 % (ref 12.0–46.0)
Lymphs Abs: 1.6 10*3/uL (ref 0.7–4.0)
MCHC: 32 g/dL (ref 30.0–36.0)
MCV: 83.3 fl (ref 78.0–100.0)
Monocytes Absolute: 0.6 10*3/uL (ref 0.1–1.0)
Monocytes Relative: 8.8 % (ref 3.0–12.0)
Neutro Abs: 4.3 10*3/uL (ref 1.4–7.7)
Neutrophils Relative %: 63.2 % (ref 43.0–77.0)
Platelets: 171 10*3/uL (ref 150.0–400.0)
RBC: 4.58 Mil/uL (ref 3.87–5.11)
RDW: 14.7 % (ref 11.5–15.5)
WBC: 6.8 10*3/uL (ref 4.0–10.5)

## 2019-08-15 NOTE — Progress Notes (Signed)
Linda [name] - 83 y.o. female MRN [id number]  Date of birth: [birth year]  Subjective Chief Complaint  Patient presents with  . Follow-up    Pt is here today for a 70-month-F/U. She is not currently fasting. She would like to get her flu & PNV-13 shots today.    HPI Linda [name] is a 83 y.o. female here today for follow up of HTN.  She was also hospitalized in 06/2019 due to GI pain/bleed and was found to have COVID-19.  Unclear etiology of her GI pain/bleed at time of hospitalization, though perhaps to be due to ischemia as CT scan did not show any cause including diverticulitis.  She reports she is feeling much better since d/c from hospital however she continues to have issues with constipation.  She has not followed up with GI since hospitalization.  She has not noticed blood in her stools.    In regards to her BP she reports that she has been taking BP medication but was in a rush to get here this morning so she has not taken yet today.  She has a cuff to check BP at home however she does not check it regularly.  She does continue to smoke, not interested in quitting at this time.  She denies chest pain, shortness of breath, palpitations, headache or vision change.   For her HLD she is treated with atorvastatin.  Reports compliance with this medication.  She has a history of PAD with prior R endarterectomy.  She denies significant claudication at this time.    ROS:  A comprehensive ROS was completed and negative except as noted per HPI  No Known Allergies  Past Medical History:  Diagnosis Date  . Arthritis   . Breast cancer ([place])   . Constipation   . Diverticulosis of colon (without mention of hemorrhage)   . GERD (gastroesophageal reflux disease)   . Hypertension   . Nail fungus   . Uterine fibroid     Past Surgical History:  Procedure Laterality Date  . ABDOMINAL HYSTERECTOMY    . BREAST LUMPECTOMY Right 2004   radiation and chemo  . CHOLECYSTECTOMY  ~1980  . COLONOSCOPY      Hx; of  . ENDARTERECTOMY Right 01/19/2014   Procedure: ENDARTERECTOMY CAROTID;  Surgeon: Serafina Mitchell, MD;  Location: [facility] OR;  Service: Vascular;  Laterality: Right;  . Right lumpectomy with right sentinel lymph node dissection  2004   Dr. Bubba Camp  . VAGINAL HYSTERECTOMY      Social History   Socioeconomic History  . Marital status: Single    Spouse name: Not on file  . Number of children: 2  . Years of education: Not on file  . Highest education level: Not on file  Occupational History  . Occupation: Retired  Scientific laboratory technician  . Financial resource strain: Not on file  . Food insecurity    Worry: Not on file    Inability: Not on file  . Transportation needs    Medical: Not on file    Non-medical: Not on file  Tobacco Use  . Smoking status: Current Every Day Smoker    Packs/day: 1.00    Types: Cigarettes  . Smokeless tobacco: Never Used  . Tobacco comment: form given 12/14/11  Substance and Sexual Activity  . Alcohol use: No    Alcohol/week: 0.0 standard drinks  . Drug use: No  . Sexual activity: Never  Lifestyle  . Physical activity    Days per week:  Not on file    Minutes per session: Not on file  . Stress: Not on file  Relationships  . Social Herbalist on phone: Not on file    Gets together: Not on file    Attends religious service: Not on file    Active member of club or organization: Not on file    Attends meetings of clubs or organizations: Not on file    Relationship status: Not on file  Other Topics Concern  . Not on file  Social History Narrative  . Not on file    Family History  Problem Relation Age of Onset  . Colon cancer Mother   . Cancer Mother        Kidney  . Pneumonia Father 47       Double  . Colon cancer Maternal Grandmother     Health Maintenance  Topic Date Due  . PNA vac Low Risk Adult (2 of 2 - PCV13) 09/30/2015  . INFLUENZA VACCINE  07/08/2019  . TETANUS/TDAP  08/22/2019 (Originally 01/14/1955)  . DEXA SCAN   Completed    ----------------------------------------------------------------------------------------------------------------------------------------------------------------------------------------------------------------- Physical Exam BP (!) 164/80 (BP Location: Left Arm, Cuff Size: Normal)   Pulse 88   Temp 97.9 F (36.6 C) (Oral)   Ht 5\' 1"  (1.549 m)   Wt 150 lb (68 kg)   SpO2 97%   BMI 28.34 kg/m   Physical Exam Constitutional:      Appearance: Normal appearance.  HENT:     Head: Normocephalic and atraumatic.     Mouth/Throat:     Mouth: Mucous membranes are moist.  Eyes:     General: No scleral icterus. Neck:     Musculoskeletal: Neck supple.  Cardiovascular:     Rate and Rhythm: Normal rate and regular rhythm.  Pulmonary:     Effort: Pulmonary effort is normal.     Breath sounds: Normal breath sounds.  Abdominal:     General: Abdomen is flat. There is no distension.     Palpations: Abdomen is soft.     Tenderness: There is no abdominal tenderness.  Skin:    General: Skin is warm and dry.  Neurological:     General: No focal deficit present.     Mental Status: She is alert.  Psychiatric:        Mood and Affect: Mood normal.        Behavior: Behavior normal.     ------------------------------------------------------------------------------------------------------------------------------------------------------------------------------------------------------------------- Assessment and Plan  Essential hypertension -BP elevated today.  -discussed importance of taking medication consistently at the same time each day.  -She will take medications once she gets home and I instructed her to check BP this afternoon.   -Update labs today.   Abdominal pain -Improved.  Needs f/u with GI, referral entered.    Tobacco abuse -counseling provided for smoking cessation however she is not interested in quitting at this time. Discussed how this greatly increases her  risk of heart attack and stroke.  Will continue to address at f/u appts.   HLD (hyperlipidemia) Tolerating atorvastatin well, recommend continuation.

## 2019-08-15 NOTE — Patient Instructions (Signed)
Be sure to take your blood pressure medications when you get home!  We'll be in touch with lab results  Expect a call from the GI office to schedule a follow up as well.    Hypertension, Adult Hypertension is another name for high blood pressure. High blood pressure forces your heart to work harder to pump blood. This can cause problems over time. There are two numbers in a blood pressure reading. There is a top number (systolic) over a bottom number (diastolic). It is best to have a blood pressure that is below 120/80. Healthy choices can help lower your blood pressure, or you may need medicine to help lower it. What are the causes? The cause of this condition is not known. Some conditions may be related to high blood pressure. What increases the risk?  Smoking.  Having type 2 diabetes mellitus, high cholesterol, or both.  Not getting enough exercise or physical activity.  Being overweight.  Having too much fat, sugar, calories, or salt (sodium) in your diet.  Drinking too much alcohol.  Having long-term (chronic) kidney disease.  Having a family history of high blood pressure.  Age. Risk increases with age.  Race. You may be at higher risk if you are African American.  Gender. Men are at higher risk than women before age 11. After age 11, women are at higher risk than men.  Having obstructive sleep apnea.  Stress. What are the signs or symptoms?  High blood pressure may not cause symptoms. Very high blood pressure (hypertensive crisis) may cause: ? Headache. ? Feelings of worry or nervousness (anxiety). ? Shortness of breath. ? Nosebleed. ? A feeling of being sick to your stomach (nausea). ? Throwing up (vomiting). ? Changes in how you see. ? Very bad chest pain. ? Seizures. How is this treated?  This condition is treated by making healthy lifestyle changes, such as: ? Eating healthy foods. ? Exercising more. ? Drinking less alcohol.  Your health care  provider may prescribe medicine if lifestyle changes are not enough to get your blood pressure under control, and if: ? Your top number is above 130. ? Your bottom number is above 80.  Your personal target blood pressure may vary. Follow these instructions at home: Eating and drinking   If told, follow the DASH eating plan. To follow this plan: ? Fill one half of your plate at each meal with fruits and vegetables. ? Fill one fourth of your plate at each meal with whole grains. Whole grains include whole-wheat pasta, brown rice, and whole-grain bread. ? Eat or drink low-fat dairy products, such as skim milk or low-fat yogurt. ? Fill one fourth of your plate at each meal with low-fat (lean) proteins. Low-fat proteins include fish, chicken without skin, eggs, beans, and tofu. ? Avoid fatty meat, cured and processed meat, or chicken with skin. ? Avoid pre-made or processed food.  Eat less than 1,500 mg of salt each day.  Do not drink alcohol if: ? Your doctor tells you not to drink. ? You are pregnant, may be pregnant, or are planning to become pregnant.  If you drink alcohol: ? Limit how much you use to:  0-1 drink a day for women.  0-2 drinks a day for men. ? Be aware of how much alcohol is in your drink. In the [place] one drink equals one 12 oz bottle of beer (355 mL), one 5 oz glass of wine (148 mL), or one 1 oz glass of hard liquor (  44 mL). Lifestyle   Work with your doctor to stay at a healthy weight or to lose weight. Ask your doctor what the best weight is for you.  Get at least 30 minutes of exercise most days of the week. This may include walking, swimming, or biking.  Get at least 30 minutes of exercise that strengthens your muscles (resistance exercise) at least 3 days a week. This may include lifting weights or doing Pilates.  Do not use any products that contain nicotine or tobacco, such as cigarettes, e-cigarettes, and chewing tobacco. If you need help quitting, ask  your doctor.  Check your blood pressure at home as told by your doctor.  Keep all follow-up visits as told by your doctor. This is important. Medicines  Take over-the-counter and prescription medicines only as told by your doctor. Follow directions carefully.  Do not skip doses of blood pressure medicine. The medicine does not work as well if you skip doses. Skipping doses also puts you at risk for problems.  Ask your doctor about side effects or reactions to medicines that you should watch for. Contact a doctor if you:  Think you are having a reaction to the medicine you are taking.  Have headaches that keep coming back (recurring).  Feel dizzy.  Have swelling in your ankles.  Have trouble with your vision. Get help right away if you:  Get a very bad headache.  Start to feel mixed up (confused).  Feel weak or numb.  Feel faint.  Have very bad pain in your: ? Chest. ? Belly (abdomen).  Throw up more than once.  Have trouble breathing. Summary  Hypertension is another name for high blood pressure.  High blood pressure forces your heart to work harder to pump blood.  For most people, a normal blood pressure is less than 120/80.  Making healthy choices can help lower blood pressure. If your blood pressure does not get lower with healthy choices, you may need to take medicine. This information is not intended to replace advice given to you by your health care provider. Make sure you discuss any questions you have with your health care provider. Document Released: 05/11/2008 Document Revised: 08/03/2018 Document Reviewed: 08/03/2018 Elsevier Patient Education  2020 [employer].

## 2019-08-15 NOTE — Assessment & Plan Note (Signed)
-  counseling provided for smoking cessation however she is not interested in quitting at this time. Discussed how this greatly increases her risk of heart attack and stroke.  Will continue to address at f/u appts.

## 2019-08-15 NOTE — Assessment & Plan Note (Signed)
-  BP elevated today.  -discussed importance of taking medication consistently at the same time each day.  -She will take medications once she gets home and I instructed her to check BP this afternoon.   -Update labs today.

## 2019-08-15 NOTE — Assessment & Plan Note (Signed)
-  Improved.  Needs f/u with GI, referral entered.

## 2019-08-15 NOTE — Assessment & Plan Note (Signed)
Tolerating atorvastatin well, recommend continuation.  

## 2019-08-21 ENCOUNTER — Other Ambulatory Visit: Payer: Self-pay

## 2019-08-21 DIAGNOSIS — E875 Hyperkalemia: Secondary | ICD-10-CM

## 2019-08-21 NOTE — Progress Notes (Signed)
Potassium is a little elevated.  I would like to recheck this.  Please enter orders for BMP and have her stop by this week for labs.

## 2019-08-22 ENCOUNTER — Telehealth: Payer: Self-pay

## 2019-08-22 NOTE — Telephone Encounter (Signed)

## 2019-08-23 ENCOUNTER — Other Ambulatory Visit (INDEPENDENT_AMBULATORY_CARE_PROVIDER_SITE_OTHER): Payer: Medicare Other

## 2019-08-23 DIAGNOSIS — E875 Hyperkalemia: Secondary | ICD-10-CM

## 2019-08-23 LAB — BASIC METABOLIC PANEL
BUN: 18 mg/dL (ref 6–23)
CO2: 25 mEq/L (ref 19–32)
Calcium: 10 mg/dL (ref 8.4–10.5)
Chloride: 106 mEq/L (ref 96–112)
Creatinine, Ser: 0.72 mg/dL (ref 0.40–1.20)
GFR: 93.93 mL/min (ref >60.00)
Glucose, Bld: 98 mg/dL (ref 70–99)
Potassium: 4.8 mEq/L (ref 3.5–5.1)
Sodium: 144 mEq/L (ref 135–145)

## 2019-09-14 ENCOUNTER — Encounter: Payer: Self-pay | Admitting: Family Medicine

## 2019-09-22 ENCOUNTER — Ambulatory Visit: Payer: Self-pay | Admitting: *Deleted

## 2019-09-22 ENCOUNTER — Telehealth: Payer: Self-pay

## 2019-09-22 NOTE — Telephone Encounter (Signed)
She also asked if she could get the Shingles again.   I let her know she can and that women are more prone to having another outbreak than men.

## 2019-09-22 NOTE — Telephone Encounter (Signed)
Spoke with pt I advise her its okay for the vaccine, once she hears from the insurance company she'll callback to sch a appt.

## 2019-09-22 NOTE — Telephone Encounter (Signed)
  I returned her call.    She was wondering if she can take the Shingles vaccine after having the Shingles.   She had an outbreak 3-4 years ago.     I let her know someone would call her back with an answer but I felt sure she could receive the vaccine after having the Shingles. She is going to call her insurance company to see if she has to get it at her pharmacy or at Dr. Zigmund Daniel' office.  I sent this request to the office.  I let her know someone would call her back.    She was agreeable to this.    Reason for Disposition . Immunization needed, questions about    Wants to know if can get the Shingles shot even though she has had the Shingles once 3-4 years ago  Answer Assessment - Initial Assessment Questions 1. APPEARANCE of RASH: "Describe the rash."      Can you get Shingles more than once.     I've had the Shingles.    2. LOCATION: "Where is the rash located?"      I had it on my forehead the first 3. ONSET: "When did the rash start?"      This was in the past 4. ITCHING: "Does the rash itch?" If so, ask: "How bad is the itch?"  (Scale 1-10; or mild, moderate, severe)     Yes it still itches after 3-4 years.   5. PAIN: "Does the rash hurt?" If so, ask: "How bad is the pain?"  (Scale 1-10; or mild, moderate, severe)     No pain unless I scratch it too much. 6. OTHER SYMPTOMS: "Do you have any other symptoms?" (e.g., fever)     No 7. PREGNANCY: "Is there any chance you are pregnant?" "When was your last menstrual period?"     N/A due to age  Protocols used: IMMUNIZATION REACTIONS-A-AH, [facility]

## 2019-09-25 NOTE — Telephone Encounter (Signed)
Ok for shingles vaccine.

## 2019-09-26 NOTE — Telephone Encounter (Signed)
Pt is going to call to sch appt if her insurance covers it.

## 2019-09-26 NOTE — Telephone Encounter (Signed)
Please call the pt and let her know and schedule nurse visit if pt ok.

## 2019-09-27 ENCOUNTER — Telehealth: Payer: Self-pay | Admitting: Family Medicine

## 2019-09-27 NOTE — Telephone Encounter (Signed)
Copied from [place] [id number]. Topic: General - Other >> Sep 27, 2019  8:56 AM Keene Breath wrote: Reason for CRM: Called to check the status of a request for a knee brace for patient that was faxed to the office last week.  Please call to discuss at [phone]

## 2019-09-27 NOTE — Telephone Encounter (Signed)
Unable to complete the form as we have not evaluated her for her knee pain.

## 2019-10-06 ENCOUNTER — Other Ambulatory Visit: Payer: Self-pay | Admitting: Family Medicine

## 2019-10-24 ENCOUNTER — Telehealth: Payer: Self-pay

## 2019-10-24 NOTE — Telephone Encounter (Signed)
Copied from [place] [id number]. Topic: General - Inquiry >> Oct 24, 2019 12:05 PM Richardo Priest, NT wrote: Reason for CRM: Pt called in stating she would like an order for home health to help her with things around the house. Please advise.

## 2019-10-26 ENCOUNTER — Other Ambulatory Visit: Payer: Self-pay | Admitting: Family Medicine

## 2019-10-31 ENCOUNTER — Other Ambulatory Visit: Payer: Self-pay | Admitting: Family Medicine

## 2019-10-31 NOTE — Telephone Encounter (Signed)
She doesn't really meet the criteria for medicare covered home health based on current health conditions and not being homebound.  I would recommend contacting [facility] [phone] or [facility] at [phone]

## 2019-11-06 ENCOUNTER — Other Ambulatory Visit: Payer: Self-pay

## 2019-11-07 ENCOUNTER — Ambulatory Visit (INDEPENDENT_AMBULATORY_CARE_PROVIDER_SITE_OTHER): Payer: Medicare Other | Admitting: Family Medicine

## 2019-11-07 ENCOUNTER — Ambulatory Visit (INDEPENDENT_AMBULATORY_CARE_PROVIDER_SITE_OTHER): Payer: Medicare Other

## 2019-11-07 ENCOUNTER — Encounter: Payer: Self-pay | Admitting: Family Medicine

## 2019-11-07 VITALS — BP 140/94 | HR 110 | Temp 96.0°F | Ht 61.0 in | Wt 148.0 lb

## 2019-11-07 DIAGNOSIS — M542 Cervicalgia: Secondary | ICD-10-CM | POA: Diagnosis not present

## 2019-11-07 DIAGNOSIS — M170 Bilateral primary osteoarthritis of knee: Secondary | ICD-10-CM

## 2019-11-07 DIAGNOSIS — M549 Dorsalgia, unspecified: Secondary | ICD-10-CM | POA: Diagnosis not present

## 2019-11-07 HISTORY — DX: Cervicalgia: M54.2

## 2019-11-07 HISTORY — DX: Bilateral primary osteoarthritis of knee: M17.0

## 2019-11-07 MED ORDER — BACLOFEN 5 MG PO TABS: 5.0000 mg | ORAL_TABLET | Freq: Two times a day (BID) | ORAL | 0 refills | Status: DC | PRN

## 2019-11-07 MED ORDER — KETOROLAC TROMETHAMINE 30 MG/ML IJ SOLN: 30.0000 mg | Freq: Once | INTRAMUSCULAR | 0 refills | Status: AC

## 2019-11-07 NOTE — Assessment & Plan Note (Signed)
Declines injection and/or surgical intervention at this time.  She will let me know if she decides that she wants referral to orthopedics.

## 2019-11-07 NOTE — Patient Instructions (Signed)

## 2019-11-07 NOTE — Assessment & Plan Note (Signed)
Lower neck/upper back pain.  Xrays ordered given chronicity of pain.  Offered toradol injection today however she declines. Will provide trial of baclofen and referral to PT.  [place] aide ordered to help with household tasks for now

## 2019-11-07 NOTE — Addendum Note (Signed)
Addended by: Lynnea Ferrier on: 11/07/2019 09:19 AM   Modules accepted: Orders

## 2019-11-07 NOTE — Progress Notes (Addendum)
Linda [name] - 83 y.o. female MRN [id number]  Date of birth: 10-07-1936  Subjective Chief Complaint  Patient presents with  . Back Pain    upper back, hurts with movement, feeling stiff. On going for awhile just getting worse     HPI Linda [name] is a 83 y.o. female wit history of HTN, nicotine dependence and OA here today with complaint of back and knee pain.  Has had chronic OA of knees and uses a cane for ambulation.  She also has had chronic upper back/lower neck pain for several months.  Reports worsening over the past couple of weeks. Pain is worse with movement, feels stiff especially when first getting up.  She denies any falls or injuries.  She does not have radiation of pain, numbness or tingling into upper extremities.  She denies shortness of breath, cough, fever, chills or chest pain.  She has never had xrays or prior treatment for this.  She would like to have a home health aide due to difficulty ambulating and completing household tasks due to pain.    ROS:  A comprehensive ROS was completed and negative except as noted per HPI  No Known Allergies  Past Medical History:  Diagnosis Date  . Arthritis   . Breast cancer ([place])   . Constipation   . Diverticulosis of colon (without mention of hemorrhage)   . GERD (gastroesophageal reflux disease)   . Hypertension   . Nail fungus   . Uterine fibroid     Past Surgical History:  Procedure Laterality Date  . ABDOMINAL HYSTERECTOMY    . BREAST LUMPECTOMY Right 2004   radiation and chemo  . CHOLECYSTECTOMY  ~1980  . COLONOSCOPY     Hx; of  . ENDARTERECTOMY Right 01/19/2014   Procedure: ENDARTERECTOMY CAROTID;  Surgeon: Serafina Mitchell, MD;  Location: [facility] OR;  Service: Vascular;  Laterality: Right;  . Right lumpectomy with right sentinel lymph node dissection  2004   Dr. Bubba Camp  . VAGINAL HYSTERECTOMY      Social History   Socioeconomic History  . Marital status: Single    Spouse name: Not on file  . Number of  children: 2  . Years of education: Not on file  . Highest education level: Not on file  Occupational History  . Occupation: Retired  Scientific laboratory technician  . Financial resource strain: Not on file  . Food insecurity    Worry: Not on file    Inability: Not on file  . Transportation needs    Medical: Not on file    Non-medical: Not on file  Tobacco Use  . Smoking status: Current Every Day Smoker    Packs/day: 1.00    Types: Cigarettes  . Smokeless tobacco: Never Used  . Tobacco comment: form given 12/14/11  Substance and Sexual Activity  . Alcohol use: No    Alcohol/week: 0.0 standard drinks  . Drug use: No  . Sexual activity: Never  Lifestyle  . Physical activity    Days per week: Not on file    Minutes per session: Not on file  . Stress: Not on file  Relationships  . Social Herbalist on phone: Not on file    Gets together: Not on file    Attends religious service: Not on file    Active member of club or organization: Not on file    Attends meetings of clubs or organizations: Not on file    Relationship status: Not on  file  Other Topics Concern  . Not on file  Social History Narrative  . Not on file    Family History  Problem Relation Age of Onset  . Colon cancer Mother   . Cancer Mother        Kidney  . Pneumonia Father 16       Double  . Colon cancer Maternal Grandmother     Health Maintenance  Topic Date Due  . TETANUS/TDAP  01/14/1957  . INFLUENZA VACCINE  Completed  . DEXA SCAN  Completed  . PNA vac Low Risk Adult  Completed    ----------------------------------------------------------------------------------------------------------------------------------------------------------------------------------------------------------------- Physical Exam BP (!) 140/94   Pulse (!) 110   Temp (!) 96 F (35.6 C)   Ht 5\' 1"  (1.549 m)   Wt 148 lb (67.1 kg)   SpO2 98%   BMI 27.96 kg/m   Physical Exam Constitutional:      Appearance: Normal appearance.   HENT:     Head: Normocephalic and atraumatic.  Eyes:     General: No scleral icterus. Cardiovascular:     Rate and Rhythm: Normal rate and regular rhythm.  Pulmonary:     Effort: Pulmonary effort is normal.     Breath sounds: Normal breath sounds.  Musculoskeletal:     Comments: Mildly kyphotic upper thoracic spine.  Non-tender spine.  Rhomboids and upper trapezius with mild ttp.  ROM of neck and shoulder is normal.  Strength in UE is normal.  Ambulates with slow, antalgic gait using cane.   Neurological:     General: No focal deficit present.     Mental Status: She is alert.  Psychiatric:        Mood and Affect: Mood normal.        Behavior: Behavior normal.     ------------------------------------------------------------------------------------------------------------------------------------------------------------------------------------------------------------------- Assessment and Plan  Neck pain Lower neck/upper back pain.  Xrays ordered given chronicity of pain.  Offered toradol injection today however she declines. Will provide trial of baclofen and referral to PT.  [place] aide ordered to help with household tasks for now   Primary osteoarthritis of both knees Declines injection and/or surgical intervention at this time.  She will let me know if she decides that she wants referral to orthopedics.    This visit occurred during the SARS-CoV-2 public health emergency.  Safety protocols were in place, including screening questions prior to the visit, additional usage of staff PPE, and extensive cleaning of exam room while observing appropriate contact time as indicated for disinfecting solutions.

## 2019-11-16 ENCOUNTER — Ambulatory Visit: Payer: Medicare Other | Attending: Family Medicine | Admitting: Physical Therapy

## 2019-11-16 ENCOUNTER — Encounter: Payer: Self-pay | Admitting: Physical Therapy

## 2019-11-16 ENCOUNTER — Other Ambulatory Visit: Payer: Self-pay

## 2019-11-16 DIAGNOSIS — M6281 Muscle weakness (generalized): Secondary | ICD-10-CM | POA: Insufficient documentation

## 2019-11-16 DIAGNOSIS — M545 Low back pain, unspecified: Secondary | ICD-10-CM

## 2019-11-16 DIAGNOSIS — R293 Abnormal posture: Secondary | ICD-10-CM | POA: Insufficient documentation

## 2019-11-16 NOTE — Patient Instructions (Signed)
  Decompression Exercise: Basic   Lie on back on firm surface, knees bent, feet flat, arms turned up, out to sides, backs of hands down. Time _2-3__ minutes.  Shoulder Press   Press both shoulders down. Hold _3__ seconds. Repeat _10__ times. Press one shoulder down. Hold __3_ seconds Repeat _10__ times. Do other shoulder. If unable to press one or both shoulders, lie in position a few sessions until you can.   Head Press With Allison chin SLIGHTLY toward chest, keep mouth closed. Feel weight on back of head. Increase weight by pressing head down. Hold _3__ seconds. Relax. Repeat __10_ times.  Leg Lengthener: Full   Straighten one leg. Pull toes AND forefoot toward knee, extend heel. Lengthen leg by pulling pelvis away from ribs. Hold ___ seconds. Relax. Repeat 10 times. Re-bend knee. Do other leg. Each leg _10__ times.  Leg Press: Single   Straighten one leg down to floor. Bring toes AND forefoot toward knee, extend heel. Press leg down. DO NOT BEND KNEE. Hold _3__ seconds. Relax leg. Repeat exercise 10 times. Relax leg. Re-bend knee. Repeat with other leg. Each leg _10__ times.

## 2019-11-16 NOTE — Therapy (Signed)
[place] [address] [place], [place], [address] Phone: [phone]   Fax:  [phone]  Physical Therapy Evaluation  Patient Details  Name: Linda [name] MRN: [id number] Date of Birth: February 24, 1936 Referring Provider (PT): Dr Luetta Nutting   Encounter Date: 11/16/2019  PT End of Session - 11/16/19 0921    Visit Number  1    Number of Visits  16    Date for PT Re-Evaluation  01/11/20    Authorization Type  [employer] MCR    PT Start Time  0849    PT Stop Time  0934    PT Time Calculation (min)  45 min    Activity Tolerance  Patient limited by pain    Behavior During Therapy  [facility] for tasks assessed/performed       Past Medical History:  Diagnosis Date  . Arthritis   . Breast cancer ([place])   . Constipation   . Diverticulosis of colon (without mention of hemorrhage)   . GERD (gastroesophageal reflux disease)   . Hypertension   . Nail fungus   . Uterine fibroid     Past Surgical History:  Procedure Laterality Date  . ABDOMINAL HYSTERECTOMY    . BREAST LUMPECTOMY Right 2004   radiation and chemo  . CHOLECYSTECTOMY  ~1980  . COLONOSCOPY     Hx; of  . ENDARTERECTOMY Right 01/19/2014   Procedure: ENDARTERECTOMY CAROTID;  Surgeon: Serafina Mitchell, MD;  Location: [facility] OR;  Service: Vascular;  Laterality: Right;  . Right lumpectomy with right sentinel lymph node dissection  2004   Dr. Bubba Camp  . VAGINAL HYSTERECTOMY      There were no vitals filed for this visit.   Subjective Assessment - 11/16/19 0850    Subjective  Pt reports she has become less active and feels like she is declining.  Her neck and back pain has progressed, she didn't realize that she walks bent over until someone pointed it out.    Pertinent History  ostoeporosis and arthritis    Patient Stated Goals  releve some of the back pain    Currently in Pain?  Yes    Pain Score  2     Pain Location  Back   pt not able to tell specific area - it is  general back pain   Pain Descriptors / Indicators  Aching;Dull    Pain Type  Chronic pain    Pain Onset  More than a month ago    Pain Frequency  Constant    Aggravating Factors   not sure    Pain Relieving Factors  not sure         [facility] PT Assessment - 11/16/19 0001      Assessment   Medical Diagnosis  Neck and back pain    Referring Provider (PT)  Dr Luetta Nutting    Onset Date/Surgical Date  11/15/17    Hand Dominance  Right    Next MD Visit  PRN    Prior Therapy  many years ago for her shoulder       Precautions   Precautions  None      Balance Screen   Has the patient fallen in the past 6 months  No    Has the patient had a decrease in activity level because of a fear of falling?   Yes    Is the patient reluctant to leave their home because of a fear of falling?  Yes      [place] residence    Living Arrangements  Children   daughter to help as needed   [place]  One level      Prior Function   Level of Independence  Independent   walk with a cane   Vocation  Retired    Leisure  sedentary life style - play games on computer , tend to plants in house      Posture/Postural Control   Posture/Postural Control  Postural limitations    Postural Limitations  Rounded Shoulders;Forward head;Increased thoracic kyphosis;Flexed trunk   upper body shift to the left      ROM / Strength   AROM / PROM / Strength  AROM;Strength      AROM   Overall AROM Comments  bilat knee extensin -15 degrees    AROM Assessment Site  Shoulder;Cervical;Lumbar    Right/Left Shoulder  --   elevation 120 degrees bilat, behind head and back WNL   Cervical Flexion  to chest    Cervical Extension  40    Cervical - Right Rotation  72    Cervical - Left Rotation  80    Lumbar Flexion  NA d/t oseoporosis    Lumbar Extension  -10 from neutral      Strength   Overall Strength Comments  bilat UE's grossly 4/5, LE's knees/ankles 4+/5, hips 4/5       Flexibility   Soft Tissue Assessment /Muscle Length  --   bilat hips and knees tight     Transfers   Comments  UE assist to stand      Ambulation/Gait   Assistive device  4-wheeled walker;Straight cane    Gait Pattern  Shuffle;Trunk flexed                Objective measurements completed on examination: See above findings.      [facility] PT Treatment/Exercise - 11/16/19 0001      Exercises   Exercises  Other Exercises    Other Exercises   10 reps decompression exercise per HEP/handout - VC for form and with body propped for comfort.       Modalities   Modalities  Moist Heat      Moist Heat Therapy   Number Minutes Moist Heat  15 Minutes    Moist Heat Location  Cervical;Lumbar Spine             PT Education - 11/16/19 0920    Education Details  HEP and POC    Person(s) Educated  Patient    Methods  Explanation;Demonstration;Handout    Comprehension  Returned demonstration;Verbalized understanding       PT Short Term Goals - 11/16/19 0928      PT SHORT TERM GOAL #1   Title  I with initial HEP for posture realignement ( 12/14/2019)    Time  4    Period  Weeks    Status  New    Target Date  12/14/19      PT SHORT TERM GOAL #2   Title  ambulate with upright posture at the hips - neutral extension ( 12/14/2019)    Time  4    Period  Weeks    Status  New    Target Date  12/14/19      PT SHORT TERM GOAL #3   Title  perform 5' of exercise without SOB ( 12/14/2019)    Time  4  Period  Weeks    Status  New    Target Date  12/14/19        PT Long Term Goals - 11/16/19 0930      PT LONG TERM GOAL #1   Title  I with advanced HEP for mobility/flexibility and strength (01/11/2020)    Time  8    Period  Weeks    Status  New    Target Date  01/11/20      PT LONG TERM GOAL #2   Title  report ability to tend to her plants with =/> 50% ease ( 01/11/2020)    Time  8    Period  Weeks    Status  New    Target Date  01/11/20      PT LONG TERM GOAL #3    Title  report =/> 50% reduction in overall back pain with daily activity ( 01/11/2020)    Time  8    Period  Weeks    Status  New    Target Date  01/11/20      PT LONG TERM GOAL #4   Title  improve bilat hip strength to allow patient to safely transfer sit to stand without UE assist ( 01/11/2020)    Time  8    Period  Weeks    Status  New    Target Date  01/11/20             Plan - 11/16/19 [id number]    Clinical Impression Statement  83 yo female with progress back and neck pain.  She presents with bent over posture and multiple postural changes that place extra stress on her joints.  The posture and pain has led to decreased mobility and her not doing as much activity as she used to, she became SOB with mobility and transfers.  She would benefit from PT to work on improving posture, increasing flexibility and improving strength to prevent progression of dx.  Her progress wll be slow d/t osteoporosis, osteoarthriis.    Personal Factors and Comorbidities  Age;Comorbidity 3+;Time since onset of injury/illness/exacerbation    Comorbidities  osteoporosis, osteoarthritis, HTN, h/o breast CA    Examination-Activity Limitations  Bathing;Sleep;Dressing;Transfers;Locomotion Level    Examination-Participation Restrictions  Other;Yard Work;Cleaning;Meal Prep    Stability/Clinical Decision Making  Evolving/Moderate complexity    Clinical Decision Making  Moderate    Rehab Potential  Good    PT Frequency  2x / week    PT Duration  8 weeks    PT Treatment/Interventions  Patient/family education;Functional mobility training;Moist Heat;Ultrasound;Therapeutic exercise;Passive range of motion;Manual techniques;Neuromuscular re-education;Gait training;Taping    PT Next Visit Plan  slow progression of ther ex, postural realignement and back of the body strengthening. heat PRN for pain    PT Home Exercise Plan  osteoporosis precautions    Consulted and Agree with Plan of Care  Patient       Patient will  benefit from skilled therapeutic intervention in order to improve the following deficits and impairments:  Abnormal gait, Decreased range of motion, Difficulty walking, Decreased endurance, Cardiopulmonary status limiting activity, Decreased activity tolerance, Pain, Impaired flexibility, Decreased strength, Decreased mobility  Visit Diagnosis: Abnormal posture - Plan: PT plan of care cert/re-cert  Muscle weakness (generalized) - Plan: PT plan of care cert/re-cert  Acute bilateral low back pain without sciatica - Plan: PT plan of care cert/re-cert     Problem List Patient Active Problem List   Diagnosis Date Noted  . Neck  pain 11/07/2019  . Primary osteoarthritis of both knees 11/07/2019  . HLD (hyperlipidemia) 08/15/2019  . Abdominal pain 06/16/2019  . AKI (acute kidney injury) ([place]) 06/15/2019  . Lower abdominal pain 06/14/2019  . Post herpetic neuralgia 11/22/2018  . Cardiac murmur 05/16/2018  . GERD (gastroesophageal reflux disease) 05/16/2018  . Tobacco abuse 09/28/2014  . Pancreatitis, acute 09/28/2014  . Carotid artery disease ([place]) 12/18/2013  . Peripheral vascular disease ([place]) 02/24/2012  . Malignant neoplasm of female breast ([place]) 04/23/2008  . Essential hypertension 04/23/2008  . DIVERTICULOSIS OF COLON 04/23/2008  . CONSTIPATION, CHRONIC 04/23/2008    Jeral Pinch PT  11/16/2019, 1:30 PM  [address] [place], [place], [address] Phone: [phone]   Fax:  [phone]  Name: Linda [name] MRN: [id number] Date of Birth: [birth year]

## 2019-11-20 ENCOUNTER — Other Ambulatory Visit: Payer: Self-pay

## 2019-11-20 ENCOUNTER — Ambulatory Visit: Payer: Medicare Other | Admitting: Physical Therapy

## 2019-11-20 DIAGNOSIS — M6281 Muscle weakness (generalized): Secondary | ICD-10-CM

## 2019-11-20 DIAGNOSIS — M545 Low back pain: Secondary | ICD-10-CM | POA: Diagnosis not present

## 2019-11-20 DIAGNOSIS — R293 Abnormal posture: Secondary | ICD-10-CM

## 2019-11-20 NOTE — Therapy (Signed)
[place], [place], [address] Phone: [phone]   Fax:  [phone]  Physical Therapy Treatment  Patient Details  Name: Linda [name] MRN: [id number] Date of Birth: 1936-07-26 Referring Provider (PT): Dr Luetta Nutting   Encounter Date: 11/20/2019  PT End of Session - 11/20/19 1556    Visit Number  2    Number of Visits  16    Date for PT Re-Evaluation  01/11/20    Authorization Type  UHC MCR    PT Start Time  [id number]    PT Stop Time  1556    PT Time Calculation (min)  41 min       Past Medical History:  Diagnosis Date  . Arthritis   . Breast cancer ([place])   . Constipation   . Diverticulosis of colon (without mention of hemorrhage)   . GERD (gastroesophageal reflux disease)   . Hypertension   . Nail fungus   . Uterine fibroid     Past Surgical History:  Procedure Laterality Date  . ABDOMINAL HYSTERECTOMY    . BREAST LUMPECTOMY Right 2004   radiation and chemo  . CHOLECYSTECTOMY  ~1980  . COLONOSCOPY     Hx; of  . ENDARTERECTOMY Right 01/19/2014   Procedure: ENDARTERECTOMY CAROTID;  Surgeon: Serafina Mitchell, MD;  Location: [facility] OR;  Service: Vascular;  Laterality: Right;  . Right lumpectomy with right sentinel lymph node dissection  2004   Dr. Bubba Camp  . VAGINAL HYSTERECTOMY      There were no vitals filed for this visit.  Subjective Assessment - 11/20/19 1514    Subjective  "doing pretty good"    Currently in Pain?  No/denies    Pain Score  0-No pain                       [facility] Adult PT Treatment/Exercise - 11/20/19 0001      Exercises   Exercises  Lumbar;Shoulder      Lumbar Exercises: Aerobic   Nustep  L3 x 5 min      Lumbar Exercises: Seated   Long Arc Quad on Chair  Strengthening;Both;20 reps;Weights    LAQ on Chair Weights (lbs)  2    Other Seated Lumbar Exercises  hip add/abd 2 x 10      Shoulder Exercises: Seated   Row  Strengthening;Both;20  reps;Theraband    Theraband Level (Shoulder Row)  Level 2 (Red)    Horizontal ABduction  Strengthening;Both;20 reps;Theraband    Theraband Level (Shoulder Horizontal ABduction)  Level 1 (Yellow)    External Rotation  Strengthening;Both;20 reps;Theraband    Theraband Level (Shoulder External Rotation)  Level 1 (Yellow)    Other Seated Exercises  neck retraction  x 10               PT Short Term Goals - 11/16/19 0928      PT SHORT TERM GOAL #1   Title  I with initial HEP for posture realignement ( 12/14/2019)    Time  4    Period  Weeks    Status  New    Target Date  12/14/19      PT SHORT TERM GOAL #2   Title  ambulate with upright posture at the hips - neutral extension ( 12/14/2019)    Time  4    Period  Weeks    Status  New    Target Date  12/14/19      PT SHORT TERM GOAL #3   Title  perform 5' of exercise without SOB ( 12/14/2019)    Time  4    Period  Weeks    Status  New    Target Date  12/14/19        PT Long Term Goals - 11/16/19 0930      PT LONG TERM GOAL #1   Title  I with advanced HEP for mobility/flexibility and strength (01/11/2020)    Time  8    Period  Weeks    Status  New    Target Date  01/11/20      PT LONG TERM GOAL #2   Title  report ability to tend to her plants with =/> 50% ease ( 01/11/2020)    Time  8    Period  Weeks    Status  New    Target Date  01/11/20      PT LONG TERM GOAL #3   Title  report =/> 50% reduction in overall back pain with daily activity ( 01/11/2020)    Time  8    Period  Weeks    Status  New    Target Date  01/11/20      PT LONG TERM GOAL #4   Title  improve bilat hip strength to allow patient to safely transfer sit to stand without UE assist ( 01/11/2020)    Time  8    Period  Weeks    Status  New    Target Date  01/11/20            Plan - 11/20/19 1557    Clinical Impression Statement  pt tolerated treatment well as demostrated by no increase of pain with interventions.pt attemped ER with red theraband,  it was too hard, but she was able to complete with yellow theraband. pt able to perform LAQ with 2# weights. pt needs verbal and tactile cues to improve form and technique of exercises. pt needs min assist to stand from Nustep.    Personal Factors and Comorbidities  Age;Comorbidity 3+;Time since onset of injury/illness/exacerbation    Comorbidities  osteoporosis, osteoarthritis, HTN, h/o breast CA    Examination-Activity Limitations  Bathing;Sleep;Dressing;Transfers;Locomotion Level    Examination-Participation Restrictions  Other;Yard Work;Cleaning;Meal Prep    Stability/Clinical Decision Making  Evolving/Moderate complexity    Rehab Potential  Good    PT Frequency  2x / week    PT Duration  8 weeks    PT Treatment/Interventions  Patient/family education;Functional mobility training;Moist Heat;Ultrasound;Therapeutic exercise;Passive range of motion;Manual techniques;Neuromuscular re-education;Gait training;Taping    PT Next Visit Plan  slow progression of ther ex, postural realignement and back of the body strengthening. heat PRN for pain    PT Home Exercise Plan  osteoporosis precautions    Consulted and Agree with Plan of Care  Patient       Patient will benefit from skilled therapeutic intervention in order to improve the following deficits and impairments:  Abnormal gait, Decreased range of motion, Difficulty walking, Decreased endurance, Cardiopulmonary status limiting activity, Decreased activity tolerance, Pain, Impaired flexibility, Decreased strength, Decreased mobility  Visit Diagnosis: Abnormal posture  Muscle weakness (generalized)     Problem List Patient Active Problem List   Diagnosis Date Noted  . Neck pain 11/07/2019  . Primary osteoarthritis of both knees 11/07/2019  . HLD (hyperlipidemia) 08/15/2019  . Abdominal pain 06/16/2019  . AKI (acute kidney injury) ([place]) 06/15/2019  . Lower abdominal  pain 06/14/2019  . Post herpetic neuralgia 11/22/2018  . Cardiac  murmur 05/16/2018  . GERD (gastroesophageal reflux disease) 05/16/2018  . Tobacco abuse 09/28/2014  . Pancreatitis, acute 09/28/2014  . Carotid artery disease ([place]) 12/18/2013  . Peripheral vascular disease ([place]) 02/24/2012  . Malignant neoplasm of female breast ([place]) 04/23/2008  . Essential hypertension 04/23/2008  . DIVERTICULOSIS OF COLON 04/23/2008  . CONSTIPATION, CHRONIC 04/23/2008   Barrett Henle, SPTA Scot Jun 11/20/2019, 4:03 PM  [place], [place], [address] Phone: [phone]   Fax:  [phone]  Name: Linda [name] MRN: [id number] Date of Birth: 1936/01/01

## 2019-11-21 ENCOUNTER — Encounter: Payer: Medicare Other | Admitting: Physical Therapy

## 2019-11-23 ENCOUNTER — Ambulatory Visit: Payer: Medicare Other | Admitting: Physical Therapy

## 2019-11-23 ENCOUNTER — Other Ambulatory Visit: Payer: Self-pay

## 2019-11-23 DIAGNOSIS — M6281 Muscle weakness (generalized): Secondary | ICD-10-CM | POA: Diagnosis not present

## 2019-11-23 DIAGNOSIS — R293 Abnormal posture: Secondary | ICD-10-CM

## 2019-11-23 DIAGNOSIS — M545 Low back pain: Secondary | ICD-10-CM | POA: Diagnosis not present

## 2019-11-23 NOTE — Therapy (Signed)
[place], [place], [address] Phone: [phone]   Fax:  [phone]  Physical Therapy Treatment  Patient Details  Name: Linda [name] MRN: [id number] Date of Birth: Oct 11, 1936 Referring Provider (PT): Dr Luetta Nutting   Encounter Date: 11/23/2019  PT End of Session - 11/23/19 1625    Visit Number  3    Number of Visits  16    Date for PT Re-Evaluation  01/11/20    Authorization Type  UHC MCR    PT Start Time  6546    PT Stop Time  1625    PT Time Calculation (min)  40 min       Past Medical History:  Diagnosis Date  . Arthritis   . Breast cancer ([place])   . Constipation   . Diverticulosis of colon (without mention of hemorrhage)   . GERD (gastroesophageal reflux disease)   . Hypertension   . Nail fungus   . Uterine fibroid     Past Surgical History:  Procedure Laterality Date  . ABDOMINAL HYSTERECTOMY    . BREAST LUMPECTOMY Right 2004   radiation and chemo  . CHOLECYSTECTOMY  ~1980  . COLONOSCOPY     Hx; of  . ENDARTERECTOMY Right 01/19/2014   Procedure: ENDARTERECTOMY CAROTID;  Surgeon: Serafina Mitchell, MD;  Location: [facility] OR;  Service: Vascular;  Laterality: Right;  . Right lumpectomy with right sentinel lymph node dissection  2004   Dr. Bubba Camp  . VAGINAL HYSTERECTOMY      There were no vitals filed for this visit.  Subjective Assessment - 11/23/19 1547    Subjective  "doing pretty good". pt states she did not have any pain after last session.    Currently in Pain?  No/denies    Pain Score  0-No pain                       [facility] Adult PT Treatment/Exercise - 11/23/19 0001      Lumbar Exercises: Aerobic   Nustep  L3 x 5 min      Lumbar Exercises: Seated   Long Arc Quad on Chair  Strengthening;Both;20 reps;Weights    LAQ on Chair Weights (lbs)  2    Sit to Stand  5 reps   w/o UE support   Other Seated Lumbar Exercises  overhead extension w/ green ball,  ab sets 2 x 10, marching 2# 2 x 10      Shoulder Exercises: Seated   Row  Strengthening;Both;20 reps;Theraband    Theraband Level (Shoulder Row)  Level 2 (Red)    Horizontal ABduction  Strengthening;Both;20 reps;Theraband    Theraband Level (Shoulder Horizontal ABduction)  Level 2 (Red)    External Rotation  Strengthening;Both;20 reps;Theraband    Theraband Level (Shoulder External Rotation)  Level 2 (Red)               PT Short Term Goals - 11/23/19 1551      PT SHORT TERM GOAL #1   Title  I with initial HEP for posture realignement ( 12/14/2019)    Status  Partially Met      PT SHORT TERM GOAL #2   Title  ambulate with upright posture at the hips - neutral extension ( 12/14/2019)    Status  On-going        PT Long Term Goals - 11/23/19 1552      PT LONG TERM GOAL #2  Title  report ability to tend to her plants with =/> 50% ease ( 01/11/2020)    Status  Partially Met      PT LONG TERM GOAL #3   Title  report =/> 50% reduction in overall back pain with daily activity ( 01/11/2020)    Status  On-going      PT LONG TERM GOAL #4   Title  improve bilat hip strength to allow patient to safely transfer sit to stand without UE assist ( 01/11/2020)    Status  On-going            Plan - 11/23/19 1632    Clinical Impression Statement  pt has no increase pt needs verbal and tactile cues to form and technique of exercises. overhead extension, ab sets, and marching were added. pt had no increase of pain with new exercises. she is able to complete STS without UE support. pt needs rest breaks secondary to fatigue. pt states she is tired at the end of the session.    Personal Factors and Comorbidities  Age;Comorbidity 3+;Time since onset of injury/illness/exacerbation    Comorbidities  osteoporosis, osteoarthritis, HTN, h/o breast CA    Examination-Activity Limitations  Bathing;Sleep;Dressing;Transfers;Locomotion Level    Examination-Participation Restrictions  Other;Yard  Work;Cleaning;Meal Prep    Stability/Clinical Decision Making  Evolving/Moderate complexity    Rehab Potential  Good    PT Frequency  2x / week    PT Duration  8 weeks    PT Treatment/Interventions  Patient/family education;Functional mobility training;Moist Heat;Ultrasound;Therapeutic exercise;Passive range of motion;Manual techniques;Neuromuscular re-education;Gait training;Taping    PT Next Visit Plan  slow progression of ther ex, postural realignement and back of the body strengthening. heat PRN for pain    PT Home Exercise Plan  osteoporosis precautions    Consulted and Agree with Plan of Care  Patient       Patient will benefit from skilled therapeutic intervention in order to improve the following deficits and impairments:  Abnormal gait, Decreased range of motion, Difficulty walking, Decreased endurance, Cardiopulmonary status limiting activity, Decreased activity tolerance, Pain, Impaired flexibility, Decreased strength, Decreased mobility  Visit Diagnosis: Abnormal posture  Muscle weakness (generalized)     Problem List Patient Active Problem List   Diagnosis Date Noted  . Neck pain 11/07/2019  . Primary osteoarthritis of both knees 11/07/2019  . HLD (hyperlipidemia) 08/15/2019  . Abdominal pain 06/16/2019  . AKI (acute kidney injury) ([place]) 06/15/2019  . Lower abdominal pain 06/14/2019  . Post herpetic neuralgia 11/22/2018  . Cardiac murmur 05/16/2018  . GERD (gastroesophageal reflux disease) 05/16/2018  . Tobacco abuse 09/28/2014  . Pancreatitis, acute 09/28/2014  . Carotid artery disease ([place]) 12/18/2013  . Peripheral vascular disease ([place]) 02/24/2012  . Malignant neoplasm of female breast ([place]) 04/23/2008  . Essential hypertension 04/23/2008  . DIVERTICULOSIS OF COLON 04/23/2008  . CONSTIPATION, CHRONIC 04/23/2008    Barrett Henle, [place] 11/23/2019, 4:39 PM  [place], [place], [address] Phone: [phone]   Fax:  [phone]  Name: Linda [name] MRN: [id number] Date of Birth: 11/05/1936

## 2019-12-05 ENCOUNTER — Ambulatory Visit: Payer: Medicare Other | Admitting: Physical Therapy

## 2019-12-05 ENCOUNTER — Other Ambulatory Visit: Payer: Self-pay

## 2019-12-05 DIAGNOSIS — M6281 Muscle weakness (generalized): Secondary | ICD-10-CM | POA: Diagnosis not present

## 2019-12-05 DIAGNOSIS — R293 Abnormal posture: Secondary | ICD-10-CM

## 2019-12-05 DIAGNOSIS — M545 Low back pain, unspecified: Secondary | ICD-10-CM

## 2019-12-05 NOTE — Therapy (Signed)
[place], [place], [address] Phone: [phone]   Fax:  [phone]  Physical Therapy Treatment  Patient Details  Name: Linda [name] MRN: [id number] Date of Birth: 09-15-1936 Referring Provider (PT): Dr Luetta Nutting   Encounter Date: 12/05/2019  PT End of Session - 12/05/19 1558    Visit Number  4    Number of Visits  16    Date for PT Re-Evaluation  01/11/20    PT Start Time  1525    PT Stop Time  1605    PT Time Calculation (min)  40 min       Past Medical History:  Diagnosis Date  . Arthritis   . Breast cancer ([place])   . Constipation   . Diverticulosis of colon (without mention of hemorrhage)   . GERD (gastroesophageal reflux disease)   . Hypertension   . Nail fungus   . Uterine fibroid     Past Surgical History:  Procedure Laterality Date  . ABDOMINAL HYSTERECTOMY    . BREAST LUMPECTOMY Right 2004   radiation and chemo  . CHOLECYSTECTOMY  ~1980  . COLONOSCOPY     Hx; of  . ENDARTERECTOMY Right 01/19/2014   Procedure: ENDARTERECTOMY CAROTID;  Surgeon: Serafina Mitchell, MD;  Location: [facility] OR;  Service: Vascular;  Laterality: Right;  . Right lumpectomy with right sentinel lymph node dissection  2004   Dr. Bubba Camp  . VAGINAL HYSTERECTOMY      There were no vitals filed for this visit.  Subjective Assessment - 12/05/19 1529    Subjective  doing pretty good. no pain    Currently in Pain?  No/denies                       [facility] Adult PT Treatment/Exercise - 12/05/19 0001      Lumbar Exercises: Aerobic   UBE (Upper Arm Bike)  L 2 2 fwd/2 back    Nustep  L3 x 5 min      Lumbar Exercises: Standing   Other Standing Lumbar Exercises  HHA 3 # hip ext and abd 10 each   min A with difficulty     Lumbar Exercises: Seated   Long Arc Quad on Chair  Strengthening;Both;2 sets;10 reps;Weights    LAQ on Chair Weights (lbs)  3    Other Seated Lumbar Exercises  hip flex  and abd 3# 2 sets 10      Shoulder Exercises: Seated   Extension  Strengthening;Both;20 reps;Theraband    Theraband Level (Shoulder Extension)  Level 2 (Red)    Row  Strengthening;Both;20 reps;Theraband    Theraband Level (Shoulder Row)  Level 2 (Red)    External Rotation  Strengthening;Both;20 reps;Theraband    Theraband Level (Shoulder External Rotation)  Level 2 (Red)               PT Short Term Goals - 12/05/19 1558      PT SHORT TERM GOAL #1   Title  I with initial HEP for posture realignement ( 12/14/2019)    Status  Achieved      PT SHORT TERM GOAL #2   Title  ambulate with upright posture at the hips - neutral extension ( 12/14/2019)    Status  Partially Met      PT SHORT TERM GOAL #3   Title  perform 5' of exercise without SOB ( 12/14/2019)    Status  On-going  PT Long Term Goals - 11/23/19 1552      PT LONG TERM GOAL #2   Title  report ability to tend to her plants with =/> 50% ease ( 01/11/2020)    Status  Partially Met      PT LONG TERM GOAL #3   Title  report =/> 50% reduction in overall back pain with daily activity ( 01/11/2020)    Status  On-going      PT LONG TERM GOAL #4   Title  improve bilat hip strength to allow patient to safely transfer sit to stand without UE assist ( 01/11/2020)    Status  On-going            Plan - 12/05/19 1559    Clinical Impression Statement  pt did well with ex but did fatigue and become SOB with freq rest needed ( more fatigued in standing) . postural cuing needed. pt verb doing HEP    PT Treatment/Interventions  Patient/family education;Functional mobility training;Moist Heat;Ultrasound;Therapeutic exercise;Passive range of motion;Manual techniques;Neuromuscular re-education;Gait training;Taping    PT Next Visit Plan  slow progression of ther ex, postural realignement and back of the body strengthening. heat PRN for pain       Patient will benefit from skilled therapeutic intervention in order to improve the  following deficits and impairments:  Abnormal gait, Decreased range of motion, Difficulty walking, Decreased endurance, Cardiopulmonary status limiting activity, Decreased activity tolerance, Pain, Impaired flexibility, Decreased strength, Decreased mobility  Visit Diagnosis: Muscle weakness (generalized)  Acute bilateral low back pain without sciatica  Abnormal posture     Problem List Patient Active Problem List   Diagnosis Date Noted  . Neck pain 11/07/2019  . Primary osteoarthritis of both knees 11/07/2019  . HLD (hyperlipidemia) 08/15/2019  . Abdominal pain 06/16/2019  . AKI (acute kidney injury) ([place]) 06/15/2019  . Lower abdominal pain 06/14/2019  . Post herpetic neuralgia 11/22/2018  . Cardiac murmur 05/16/2018  . GERD (gastroesophageal reflux disease) 05/16/2018  . Tobacco abuse 09/28/2014  . Pancreatitis, acute 09/28/2014  . Carotid artery disease ([place]) 12/18/2013  . Peripheral vascular disease ([place]) 02/24/2012  . Malignant neoplasm of female breast ([place]) 04/23/2008  . Essential hypertension 04/23/2008  . DIVERTICULOSIS OF COLON 04/23/2008  . CONSTIPATION, CHRONIC 04/23/2008    Neyland Pettengill,ANGIE PTA 12/05/2019, 4:00 PM  [place] [address] [place], [place], [address] Phone: [phone]   Fax:  [phone]  Name: KEMARA QUIGLEY MRN: [id number] Date of Birth: 1935/12/19

## 2019-12-13 ENCOUNTER — Other Ambulatory Visit: Payer: Self-pay

## 2019-12-13 ENCOUNTER — Encounter: Payer: Self-pay | Admitting: Physical Therapy

## 2019-12-13 ENCOUNTER — Ambulatory Visit: Payer: Medicare Other | Attending: Family Medicine | Admitting: Physical Therapy

## 2019-12-13 DIAGNOSIS — M545 Low back pain, unspecified: Secondary | ICD-10-CM

## 2019-12-13 DIAGNOSIS — M6281 Muscle weakness (generalized): Secondary | ICD-10-CM

## 2019-12-13 DIAGNOSIS — R293 Abnormal posture: Secondary | ICD-10-CM | POA: Diagnosis not present

## 2019-12-13 NOTE — Therapy (Signed)
[place], [place], [address] Phone: [phone]   Fax:  [phone]  Physical Therapy Treatment  Patient Details  Name: Linda [name] MRN: [id number] Date of Birth: 18-Sep-1936 Referring Provider (PT): Dr Luetta Nutting   Encounter Date: 12/13/2019  PT End of Session - 12/13/19 1558    Visit Number  5    Date for PT Re-Evaluation  01/11/20    Authorization Type  UHC MCR    PT Start Time  3382    PT Stop Time  1559    PT Time Calculation (min)  44 min    Activity Tolerance  Patient tolerated treatment well    Behavior During Therapy  [facility] for tasks assessed/performed       Past Medical History:  Diagnosis Date  . Arthritis   . Breast cancer ([place])   . Constipation   . Diverticulosis of colon (without mention of hemorrhage)   . GERD (gastroesophageal reflux disease)   . Hypertension   . Nail fungus   . Uterine fibroid     Past Surgical History:  Procedure Laterality Date  . ABDOMINAL HYSTERECTOMY    . BREAST LUMPECTOMY Right 2004   radiation and chemo  . CHOLECYSTECTOMY  ~1980  . COLONOSCOPY     Hx; of  . ENDARTERECTOMY Right 01/19/2014   Procedure: ENDARTERECTOMY CAROTID;  Surgeon: Serafina Mitchell, MD;  Location: [facility] OR;  Service: Vascular;  Laterality: Right;  . Right lumpectomy with right sentinel lymph node dissection  2004   Dr. Bubba Camp  . VAGINAL HYSTERECTOMY      There were no vitals filed for this visit.  Subjective Assessment - 12/13/19 1520    Subjective  Pt reports that the cold weather has her sore, stiff, and aching    Currently in Pain?  No/denies                       [facility] Adult PT Treatment/Exercise - 12/13/19 0001      Lumbar Exercises: Aerobic   UBE (Upper Arm Bike)  L 2 2 fwd/2 back    Nustep  L3 x 5 min      Lumbar Exercises: Seated   Sit to Stand  5 reps   3 sets airex on mat    Other Seated Lumbar Exercises  HS curls red ten yellow 2x10       Shoulder Exercises: Seated   Row  Strengthening;Both;20 reps;Theraband    Theraband Level (Shoulder Row)  Level 2 (Red)    Other Seated Exercises  Horrizontal Abd yellow 2x10 wothj postural assist                PT Short Term Goals - 12/05/19 1558      PT SHORT TERM GOAL #1   Title  I with initial HEP for posture realignement ( 12/14/2019)    Status  Achieved      PT SHORT TERM GOAL #2   Title  ambulate with upright posture at the hips - neutral extension ( 12/14/2019)    Status  Partially Met      PT SHORT TERM GOAL #3   Title  perform 5' of exercise without SOB ( 12/14/2019)    Status  On-going        PT Long Term Goals - 11/23/19 1552      PT LONG TERM GOAL #2   Title  report ability to tend to  her plants with =/> 50% ease ( 01/11/2020)    Status  Partially Met      PT LONG TERM GOAL #3   Title  report =/> 50% reduction in overall back pain with daily activity ( 01/11/2020)    Status  On-going      PT LONG TERM GOAL #4   Title  improve bilat hip strength to allow patient to safely transfer sit to stand without UE assist ( 01/11/2020)    Status  On-going            Plan - 12/13/19 1559    Clinical Impression Statement  Pt did well overall. Added some postural assist with horizontal shoulder abduction. She did well with sit to stands from elevated surface. Postural cues needed with seated rows. RLE appeared to be stronger than L with seated hamstring curls.    Personal Factors and Comorbidities  Age;Comorbidity 3+;Time since onset of injury/illness/exacerbation    Comorbidities  osteoporosis, osteoarthritis, HTN, h/o breast CA    Examination-Activity Limitations  Bathing;Sleep;Dressing;Transfers;Locomotion Level    Examination-Participation Restrictions  Other;Yard Work;Cleaning;Meal Prep    PT Frequency  2x / week    PT Duration  8 weeks    PT Treatment/Interventions  Patient/family education;Functional mobility training;Moist Heat;Ultrasound;Therapeutic  exercise;Passive range of motion;Manual techniques;Neuromuscular re-education;Gait training;Taping    PT Next Visit Plan  slow progression of ther ex, postural realignement and back of the body strengthening. heat PRN for pain       Patient will benefit from skilled therapeutic intervention in order to improve the following deficits and impairments:  Abnormal gait, Decreased range of motion, Difficulty walking, Decreased endurance, Cardiopulmonary status limiting activity, Decreased activity tolerance, Pain, Impaired flexibility, Decreased strength, Decreased mobility  Visit Diagnosis: Acute bilateral low back pain without sciatica  Abnormal posture  Muscle weakness (generalized)     Problem List Patient Active Problem List   Diagnosis Date Noted  . Neck pain 11/07/2019  . Primary osteoarthritis of both knees 11/07/2019  . HLD (hyperlipidemia) 08/15/2019  . Abdominal pain 06/16/2019  . AKI (acute kidney injury) ([place]) 06/15/2019  . Lower abdominal pain 06/14/2019  . Post herpetic neuralgia 11/22/2018  . Cardiac murmur 05/16/2018  . GERD (gastroesophageal reflux disease) 05/16/2018  . Tobacco abuse 09/28/2014  . Pancreatitis, acute 09/28/2014  . Carotid artery disease ([place]) 12/18/2013  . Peripheral vascular disease ([place]) 02/24/2012  . Malignant neoplasm of female breast ([place]) 04/23/2008  . Essential hypertension 04/23/2008  . DIVERTICULOSIS OF COLON 04/23/2008  . CONSTIPATION, CHRONIC 04/23/2008    Scot Jun, PTA 12/13/2019, 4:00 PM  [place], [place], [address] Phone: [phone]   Fax:  [phone]  Name: ALEXIAS MARGERUM MRN: [id number] Date of Birth: December 19, 1935

## 2019-12-14 ENCOUNTER — Ambulatory Visit: Payer: Medicare Other | Admitting: Physical Therapy

## 2019-12-14 ENCOUNTER — Encounter: Payer: Self-pay | Admitting: Physical Therapy

## 2019-12-14 DIAGNOSIS — M6281 Muscle weakness (generalized): Secondary | ICD-10-CM | POA: Diagnosis not present

## 2019-12-14 DIAGNOSIS — R293 Abnormal posture: Secondary | ICD-10-CM

## 2019-12-14 DIAGNOSIS — M545 Low back pain, unspecified: Secondary | ICD-10-CM

## 2019-12-14 NOTE — Therapy (Signed)
[place], [place], [address] Phone: [phone]   Fax:  [phone]  Physical Therapy Treatment  Patient Details  Name: Linda [name] MRN: [id number] Date of Birth: 03-Aug-1936 Referring Provider (PT): Dr Luetta Nutting   Encounter Date: 12/14/2019  PT End of Session - 12/14/19 1648    Visit Number  6    Number of Visits  16    Date for PT Re-Evaluation  01/11/20    Authorization Type  UHC MCR    PT Start Time  1600    PT Stop Time  1648    PT Time Calculation (min)  48 min    Activity Tolerance  Patient tolerated treatment well    Behavior During Therapy  [facility] for tasks assessed/performed       Past Medical History:  Diagnosis Date  . Arthritis   . Breast cancer ([place])   . Constipation   . Diverticulosis of colon (without mention of hemorrhage)   . GERD (gastroesophageal reflux disease)   . Hypertension   . Nail fungus   . Uterine fibroid     Past Surgical History:  Procedure Laterality Date  . ABDOMINAL HYSTERECTOMY    . BREAST LUMPECTOMY Right 2004   radiation and chemo  . CHOLECYSTECTOMY  ~1980  . COLONOSCOPY     Hx; of  . ENDARTERECTOMY Right 01/19/2014   Procedure: ENDARTERECTOMY CAROTID;  Surgeon: Serafina Mitchell, MD;  Location: [facility] OR;  Service: Vascular;  Laterality: Right;  . Right lumpectomy with right sentinel lymph node dissection  2004   Dr. Bubba Camp  . VAGINAL HYSTERECTOMY      There were no vitals filed for this visit.  Subjective Assessment - 12/14/19 1606    Subjective  "I am just stiff, from sitting watching TV all day"    Currently in Pain?  No/denies                       [facility] Adult PT Treatment/Exercise - 12/14/19 0001      Lumbar Exercises: Aerobic   UBE (Upper Arm Bike)  L 2 2 fwd/2 back    Nustep  L4 x 5 min      Lumbar Exercises: Seated   Long Arc Quad on Chair  Strengthening;Both;2 sets;10 reps;Weights    LAQ on Chair Weights  (lbs)  1.5    Sit to Stand  5 reps   x2 airex on mat table   Other Seated Lumbar Exercises  Alt March 1.5lb x 10 each     Other Seated Lumbar Exercises  HS cuels yellow 2x12      Shoulder Exercises: Seated   Other Seated Exercises  Horrizontal Abd yellow 2x10 with postural assist                PT Short Term Goals - 12/05/19 1558      PT SHORT TERM GOAL #1   Title  I with initial HEP for posture realignement ( 12/14/2019)    Status  Achieved      PT SHORT TERM GOAL #2   Title  ambulate with upright posture at the hips - neutral extension ( 12/14/2019)    Status  Partially Met      PT SHORT TERM GOAL #3   Title  perform 5' of exercise without SOB ( 12/14/2019)    Status  On-going        PT Long Term Goals -  12/14/19 1649      PT LONG TERM GOAL #1   Title  I with advanced HEP for mobility/flexibility and strength (01/11/2020)    Status  Partially Met      PT LONG TERM GOAL #2   Title  report ability to tend to her plants with =/> 50% ease ( 01/11/2020)    Status  On-going      PT LONG TERM GOAL #3   Title  report =/> 50% reduction in overall back pain with daily activity ( 01/11/2020)    Status  Partially Met      PT LONG TERM GOAL #4   Title  improve bilat hip strength to allow patient to safely transfer sit to stand without UE assist ( 01/11/2020)    Status  Partially Met            Plan - 12/14/19 1649    Clinical Impression Statement  Pt has forward posture and rounded shoulder. Pain reported when asked to stand upright. L knee is painful with extension. L hip weakness present with alternating marches. She is able to complete sit to stand without AS when airex is on mat table.    Personal Factors and Comorbidities  Age;Comorbidity 3+;Time since onset of injury/illness/exacerbation    Comorbidities  osteoporosis, osteoarthritis, HTN, h/o breast CA    Examination-Activity Limitations  Bathing;Sleep;Dressing;Transfers;Locomotion Level    Examination-Participation  Restrictions  Other;Yard Work;Cleaning;Meal Prep    Stability/Clinical Decision Making  Evolving/Moderate complexity    Rehab Potential  Good    PT Frequency  2x / week    PT Treatment/Interventions  Patient/family education;Functional mobility training;Moist Heat;Ultrasound;Therapeutic exercise;Passive range of motion;Manual techniques;Neuromuscular re-education;Gait training;Taping    PT Next Visit Plan  slow progression of ther ex, postural realignement and back of the body strengthening. heat PRN for pain       Patient will benefit from skilled therapeutic intervention in order to improve the following deficits and impairments:  Abnormal gait, Decreased range of motion, Difficulty walking, Decreased endurance, Cardiopulmonary status limiting activity, Decreased activity tolerance, Pain, Impaired flexibility, Decreased strength, Decreased mobility  Visit Diagnosis: Muscle weakness (generalized)  Abnormal posture  Acute bilateral low back pain without sciatica     Problem List Patient Active Problem List   Diagnosis Date Noted  . Neck pain 11/07/2019  . Primary osteoarthritis of both knees 11/07/2019  . HLD (hyperlipidemia) 08/15/2019  . Abdominal pain 06/16/2019  . AKI (acute kidney injury) ([place]) 06/15/2019  . Lower abdominal pain 06/14/2019  . Post herpetic neuralgia 11/22/2018  . Cardiac murmur 05/16/2018  . GERD (gastroesophageal reflux disease) 05/16/2018  . Tobacco abuse 09/28/2014  . Pancreatitis, acute 09/28/2014  . Carotid artery disease ([place]) 12/18/2013  . Peripheral vascular disease ([place]) 02/24/2012  . Malignant neoplasm of female breast ([place]) 04/23/2008  . Essential hypertension 04/23/2008  . DIVERTICULOSIS OF COLON 04/23/2008  . CONSTIPATION, CHRONIC 04/23/2008    Scot Jun, PTA 12/14/2019, 4:52 PM  [place], [place], [address] Phone: [phone]   Fax:   [phone]  Name: Linda [name] MRN: [id number] Date of Birth: 1936-05-08

## 2019-12-19 ENCOUNTER — Ambulatory Visit: Payer: Medicare Other | Admitting: Physical Therapy

## 2019-12-19 ENCOUNTER — Other Ambulatory Visit: Payer: Self-pay

## 2019-12-19 ENCOUNTER — Encounter: Payer: Self-pay | Admitting: Physical Therapy

## 2019-12-19 DIAGNOSIS — R293 Abnormal posture: Secondary | ICD-10-CM

## 2019-12-19 DIAGNOSIS — M545 Low back pain, unspecified: Secondary | ICD-10-CM

## 2019-12-19 DIAGNOSIS — M6281 Muscle weakness (generalized): Secondary | ICD-10-CM | POA: Diagnosis not present

## 2019-12-19 NOTE — Therapy (Signed)
[place], [place], [address] Phone: [phone]   Fax:  [phone]  Physical Therapy Treatment  Patient Details  Name: Linda [name] MRN: [id number] Date of Birth: May 18, 1936 Referring Provider (PT): Dr Luetta Nutting   Encounter Date: 12/19/2019  PT End of Session - 12/19/19 1635    Visit Number  7    Number of Visits  16    Date for PT Re-Evaluation  01/11/20    Authorization Type  UHC MCR    PT Start Time  3570    PT Stop Time  1635    PT Time Calculation (min)  39 min    Activity Tolerance  Patient tolerated treatment well    Behavior During Therapy  [facility] for tasks assessed/performed       Past Medical History:  Diagnosis Date  . Arthritis   . Breast cancer ([place])   . Constipation   . Diverticulosis of colon (without mention of hemorrhage)   . GERD (gastroesophageal reflux disease)   . Hypertension   . Nail fungus   . Uterine fibroid     Past Surgical History:  Procedure Laterality Date  . ABDOMINAL HYSTERECTOMY    . BREAST LUMPECTOMY Right 2004   radiation and chemo  . CHOLECYSTECTOMY  ~1980  . COLONOSCOPY     Hx; of  . ENDARTERECTOMY Right 01/19/2014   Procedure: ENDARTERECTOMY CAROTID;  Surgeon: Serafina Mitchell, MD;  Location: [facility] OR;  Service: Vascular;  Laterality: Right;  . Right lumpectomy with right sentinel lymph node dissection  2004   Dr. Bubba Camp  . VAGINAL HYSTERECTOMY      There were no vitals filed for this visit.  Subjective Assessment - 12/19/19 1600    Subjective  "Im fine"    Currently in Pain?  No/denies                       [facility] Adult PT Treatment/Exercise - 12/19/19 0001      Lumbar Exercises: Stretches   Other Lumbar Stretch Exercise  Passive postural Ext 4 x 10sec      Lumbar Exercises: Aerobic   Nustep  L4 x 5 min      Lumbar Exercises: Seated   Long Arc Quad on Chair  Strengthening;Both;2 sets;10 reps;Weights    LAQ on  Chair Weights (lbs)  2    Sit to Stand  20 reps   airex on mat able    Other Seated Lumbar Exercises  Alt March 2lb x 10 each     Other Seated Lumbar Exercises  Mini sir up BOSU at back 2x10; OHP red ball 2x10       Shoulder Exercises: Seated   Row  Strengthening;Both;Theraband;10 reps    Theraband Level (Shoulder Row)  Level 2 (Red)               PT Short Term Goals - 12/05/19 1558      PT SHORT TERM GOAL #1   Title  I with initial HEP for posture realignement ( 12/14/2019)    Status  Achieved      PT SHORT TERM GOAL #2   Title  ambulate with upright posture at the hips - neutral extension ( 12/14/2019)    Status  Partially Met      PT SHORT TERM GOAL #3   Title  perform 5' of exercise without SOB ( 12/14/2019)    Status  On-going  PT Long Term Goals - 12/14/19 1649      PT LONG TERM GOAL #1   Title  I with advanced HEP for mobility/flexibility and strength (01/11/2020)    Status  Partially Met      PT LONG TERM GOAL #2   Title  report ability to tend to her plants with =/> 50% ease ( 01/11/2020)    Status  On-going      PT LONG TERM GOAL #3   Title  report =/> 50% reduction in overall back pain with daily activity ( 01/11/2020)    Status  Partially Met      PT LONG TERM GOAL #4   Title  improve bilat hip strength to allow patient to safely transfer sit to stand without UE assist ( 01/11/2020)    Status  Partially Met            Plan - 12/19/19 1636    Clinical Impression Statement  Pt did well overall today. She enters clinic with an improved gait and increase speed. She reports that her knee was feeling better. She also reported that's he was able to go out shopping for over an hour earlier. Added som passive postural stretching focusing on shoulder retraction and lumbar extension. Seated OHP really challenged pt core. Good elevation with alt march.    Personal Factors and Comorbidities  Age;Comorbidity 3+;Time since onset of injury/illness/exacerbation     Comorbidities  osteoporosis, osteoarthritis, HTN, h/o breast CA    Examination-Activity Limitations  Bathing;Sleep;Dressing;Transfers;Locomotion Level    Examination-Participation Restrictions  Other;Yard Work;Cleaning;Meal Prep    Stability/Clinical Decision Making  Evolving/Moderate complexity    Rehab Potential  Good    PT Frequency  2x / week    PT Duration  8 weeks    PT Treatment/Interventions  Patient/family education;Functional mobility training;Moist Heat;Ultrasound;Therapeutic exercise;Passive range of motion;Manual techniques;Neuromuscular re-education;Gait training;Taping    PT Next Visit Plan  slow progression of ther ex, postural realignement and back of the body strengthening. heat PRN for pain       Patient will benefit from skilled therapeutic intervention in order to improve the following deficits and impairments:  Abnormal gait, Decreased range of motion, Difficulty walking, Decreased endurance, Cardiopulmonary status limiting activity, Decreased activity tolerance, Pain, Impaired flexibility, Decreased strength, Decreased mobility  Visit Diagnosis: Muscle weakness (generalized)  Abnormal posture  Acute bilateral low back pain without sciatica     Problem List Patient Active Problem List   Diagnosis Date Noted  . Neck pain 11/07/2019  . Primary osteoarthritis of both knees 11/07/2019  . HLD (hyperlipidemia) 08/15/2019  . Abdominal pain 06/16/2019  . AKI (acute kidney injury) ([place]) 06/15/2019  . Lower abdominal pain 06/14/2019  . Post herpetic neuralgia 11/22/2018  . Cardiac murmur 05/16/2018  . GERD (gastroesophageal reflux disease) 05/16/2018  . Tobacco abuse 09/28/2014  . Pancreatitis, acute 09/28/2014  . Carotid artery disease ([place]) 12/18/2013  . Peripheral vascular disease ([place]) 02/24/2012  . Malignant neoplasm of female breast ([place]) 04/23/2008  . Essential hypertension 04/23/2008  . DIVERTICULOSIS OF COLON 04/23/2008  . CONSTIPATION, CHRONIC  04/23/2008    Scot Jun 12/19/2019, 4:43 PM  [place] [address] [place], [place], [address] Phone: [phone]   Fax:  [phone]  Name: Linda [name] MRN: [id number] Date of Birth: [birth year]

## 2019-12-21 ENCOUNTER — Encounter: Payer: Self-pay | Admitting: Physical Therapy

## 2019-12-21 ENCOUNTER — Other Ambulatory Visit: Payer: Self-pay

## 2019-12-21 ENCOUNTER — Ambulatory Visit: Payer: Medicare Other | Admitting: Physical Therapy

## 2019-12-21 DIAGNOSIS — R293 Abnormal posture: Secondary | ICD-10-CM | POA: Diagnosis not present

## 2019-12-21 DIAGNOSIS — M6281 Muscle weakness (generalized): Secondary | ICD-10-CM

## 2019-12-21 DIAGNOSIS — M545 Low back pain, unspecified: Secondary | ICD-10-CM

## 2019-12-21 NOTE — Therapy (Signed)
[place], [place], [address] Phone: [phone]   Fax:  [phone]  Physical Therapy Treatment  Patient Details  Name: Linda [name] MRN: [id number] Date of Birth: [birth year] Referring Provider (PT): Dr Luetta Nutting   Encounter Date: 12/21/2019  PT End of Session - 12/21/19 1633    Visit Number  8    Date for PT Re-Evaluation  01/11/20    Authorization Type  UHC MCR    PT Start Time  1600    PT Stop Time  1633    PT Time Calculation (min)  33 min    Activity Tolerance  Patient tolerated treatment well    Behavior During Therapy  [facility] for tasks assessed/performed       Past Medical History:  Diagnosis Date  . Arthritis   . Breast cancer ([place])   . Constipation   . Diverticulosis of colon (without mention of hemorrhage)   . GERD (gastroesophageal reflux disease)   . Hypertension   . Nail fungus   . Uterine fibroid     Past Surgical History:  Procedure Laterality Date  . ABDOMINAL HYSTERECTOMY    . BREAST LUMPECTOMY Right 2004   radiation and chemo  . CHOLECYSTECTOMY  ~1980  . COLONOSCOPY     Hx; of  . ENDARTERECTOMY Right 01/19/2014   Procedure: ENDARTERECTOMY CAROTID;  Surgeon: Serafina Mitchell, MD;  Location: [facility] OR;  Service: Vascular;  Laterality: Right;  . Right lumpectomy with right sentinel lymph node dissection  2004   Dr. Bubba Camp  . VAGINAL HYSTERECTOMY      There were no vitals filed for this visit.  Subjective Assessment - 12/21/19 1600    Subjective  "I feel good"    Pertinent History  osteoporosis and arthritis    Currently in Pain?  No/denies                       [facility] Adult PT Treatment/Exercise - 12/21/19 0001      Lumbar Exercises: Aerobic   UBE (Upper Arm Bike)  L 2 2 fwd/2 back    Nustep  L3 x 5 min      Lumbar Exercises: Seated   Other Seated Lumbar Exercises  HS cuels yellow 2x10, ball squeezes 2x15       Shoulder Exercises: Seated    Row  Strengthening;Both;Theraband;10 reps    Theraband Level (Shoulder Row)  Level 3 (Green)               PT Short Term Goals - 12/05/19 1558      PT SHORT TERM GOAL #1   Title  I with initial HEP for posture realignement ( 12/14/2019)    Status  Achieved      PT SHORT TERM GOAL #2   Title  ambulate with upright posture at the hips - neutral extension ( 12/14/2019)    Status  Partially Met      PT SHORT TERM GOAL #3   Title  perform 5' of exercise without SOB ( 12/14/2019)    Status  On-going        PT Long Term Goals - 12/21/19 1637      PT LONG TERM GOAL #1   Title  I with advanced HEP for mobility/flexibility and strength (01/11/2020)    Status  Achieved      PT LONG TERM GOAL #2   Title  report ability to tend to  her plants with =/> 50% ease ( 01/11/2020)    Status  On-going      PT LONG TERM GOAL #3   Title  report =/> 50% reduction in overall back pain with daily activity ( 01/11/2020)    Status  Achieved      PT LONG TERM GOAL #4   Title  improve bilat hip strength to allow patient to safely transfer sit to stand without UE assist ( 01/11/2020)    Status  Partially Met            Plan - 12/21/19 1637    Clinical Impression Statement  Pt reports improved mobility at home. She reports negotiating stairs better, she also reports increase LE strength. Her L HS strength has noticeably improved when doing the exercises. She reports being able to go shopping today. No reports of pain. Cues to prevent swaying with seated rows.    Personal Factors and Comorbidities  Age;Comorbidity 3+;Time since onset of injury/illness/exacerbation    Comorbidities  osteoporosis, osteoarthritis, HTN, h/o breast CA    Examination-Activity Limitations  Bathing;Sleep;Dressing;Transfers;Locomotion Level    Examination-Participation Restrictions  Other;Yard Work;Cleaning;Meal Prep    Stability/Clinical Decision Making  Evolving/Moderate complexity    Rehab Potential  Good    PT Frequency   2x / week    PT Duration  8 weeks    PT Treatment/Interventions  Patient/family education;Functional mobility training;Moist Heat;Ultrasound;Therapeutic exercise;Passive range of motion;Manual techniques;Neuromuscular re-education;Gait training;Taping    PT Next Visit Plan  slow progression of ther ex, postural realignment and back of the body strengthening. heat PRN for pain       Patient will benefit from skilled therapeutic intervention in order to improve the following deficits and impairments:  Abnormal gait, Decreased range of motion, Difficulty walking, Decreased endurance, Cardiopulmonary status limiting activity, Decreased activity tolerance, Pain, Impaired flexibility, Decreased strength, Decreased mobility  Visit Diagnosis: Abnormal posture  Acute bilateral low back pain without sciatica  Muscle weakness (generalized)     Problem List Patient Active Problem List   Diagnosis Date Noted  . Neck pain 11/07/2019  . Primary osteoarthritis of both knees 11/07/2019  . HLD (hyperlipidemia) 08/15/2019  . Abdominal pain 06/16/2019  . AKI (acute kidney injury) ([place]) 06/15/2019  . Lower abdominal pain 06/14/2019  . Post herpetic neuralgia 11/22/2018  . Cardiac murmur 05/16/2018  . GERD (gastroesophageal reflux disease) 05/16/2018  . Tobacco abuse 09/28/2014  . Pancreatitis, acute 09/28/2014  . Carotid artery disease ([place]) 12/18/2013  . Peripheral vascular disease ([place]) 02/24/2012  . Malignant neoplasm of female breast ([place]) 04/23/2008  . Essential hypertension 04/23/2008  . DIVERTICULOSIS OF COLON 04/23/2008  . CONSTIPATION, CHRONIC 04/23/2008    Scot Jun, PTA 12/21/2019, 4:40 PM  [place], [place], [address] Phone: [phone]   Fax:  [phone]  Name: Linda [name] MRN: [id number] Date of Birth: 1936/01/21

## 2019-12-25 ENCOUNTER — Other Ambulatory Visit: Payer: Self-pay

## 2019-12-25 ENCOUNTER — Encounter: Payer: Self-pay | Admitting: Physical Therapy

## 2019-12-25 ENCOUNTER — Ambulatory Visit: Payer: Medicare Other | Admitting: Physical Therapy

## 2019-12-25 DIAGNOSIS — M545 Low back pain, unspecified: Secondary | ICD-10-CM

## 2019-12-25 DIAGNOSIS — R293 Abnormal posture: Secondary | ICD-10-CM

## 2019-12-25 DIAGNOSIS — M6281 Muscle weakness (generalized): Secondary | ICD-10-CM | POA: Diagnosis not present

## 2019-12-25 NOTE — Therapy (Signed)
[place], [place], [address] Phone: [phone]   Fax:  [phone]  Physical Therapy Treatment  Patient Details  Name: Linda [name] MRN: [id number] Date of Birth: 14-Sep-1936 Referring Provider (PT): Dr Luetta Nutting   Encounter Date: 12/25/2019  PT End of Session - 12/25/19 1638    Visit Number  9    Date for PT Re-Evaluation  01/11/20    Authorization Type  UHC MCR    PT Start Time  3143    PT Stop Time  1639    PT Time Calculation (min)  41 min    Activity Tolerance  Patient tolerated treatment well    Behavior During Therapy  [facility] for tasks assessed/performed       Past Medical History:  Diagnosis Date  . Arthritis   . Breast cancer ([place])   . Constipation   . Diverticulosis of colon (without mention of hemorrhage)   . GERD (gastroesophageal reflux disease)   . Hypertension   . Nail fungus   . Uterine fibroid     Past Surgical History:  Procedure Laterality Date  . ABDOMINAL HYSTERECTOMY    . BREAST LUMPECTOMY Right 2004   radiation and chemo  . CHOLECYSTECTOMY  ~1980  . COLONOSCOPY     Hx; of  . ENDARTERECTOMY Right 01/19/2014   Procedure: ENDARTERECTOMY CAROTID;  Surgeon: Serafina Mitchell, MD;  Location: [facility] OR;  Service: Vascular;  Laterality: Right;  . Right lumpectomy with right sentinel lymph node dissection  2004   Dr. Bubba Camp  . VAGINAL HYSTERECTOMY      There were no vitals filed for this visit.  Subjective Assessment - 12/25/19 1603    Subjective  Pt reports an aggravating sensation in her lower back, this started when she woke up this morning    Currently in Pain?  No/denies                       [facility] Adult PT Treatment/Exercise - 12/25/19 0001      Lumbar Exercises: Aerobic   UBE (Upper Arm Bike)  L 2 2 fwd/2 back    Nustep  L3 x 5 min      Lumbar Exercises: Seated   Long Arc Quad on Chair  Strengthening;Both;2 sets;10 reps;Weights    LAQ  on Chair Weights (lbs)  3    Sit to Stand  5 reps   x3 airex on mat with UE    Other Seated Lumbar Exercises  HS curls red 2x15, ball squeezes 2x15       Shoulder Exercises: Seated   Other Seated Exercises  Horrizontal Abd yellow x10 x4 with postural assist       Shoulder Exercises: Standing   Row  Theraband;20 reps;Both    Theraband Level (Shoulder Row)  Level 2 (Red)               PT Short Term Goals - 12/05/19 1558      PT SHORT TERM GOAL #1   Title  I with initial HEP for posture realignement ( 12/14/2019)    Status  Achieved      PT SHORT TERM GOAL #2   Title  ambulate with upright posture at the hips - neutral extension ( 12/14/2019)    Status  Partially Met      PT SHORT TERM GOAL #3   Title  perform 5' of exercise without SOB ( 12/14/2019)  Status  On-going        PT Long Term Goals - 12/21/19 1637      PT LONG TERM GOAL #1   Title  I with advanced HEP for mobility/flexibility and strength (01/11/2020)    Status  Achieved      PT LONG TERM GOAL #2   Title  report ability to tend to her plants with =/> 50% ease ( 01/11/2020)    Status  On-going      PT LONG TERM GOAL #3   Title  report =/> 50% reduction in overall back pain with daily activity ( 01/11/2020)    Status  Achieved      PT LONG TERM GOAL #4   Title  improve bilat hip strength to allow patient to safely transfer sit to stand without UE assist ( 01/11/2020)    Status  Partially Met            Plan - 12/25/19 1639    Clinical Impression Statement  Some back discomfort reported at the beginning of treatment. Post treatment she said this discomfort had went away. No reports of increase pain. increase fatigue with rows standing up. Cues for TKE with LAQ. Positive response to passive trunk ext with hori abd.    Personal Factors and Comorbidities  Age;Comorbidity 3+;Time since onset of injury/illness/exacerbation    Examination-Activity Limitations  Bathing;Sleep;Dressing;Transfers;Locomotion Level     Stability/Clinical Decision Making  Evolving/Moderate complexity    Rehab Potential  Good    PT Frequency  2x / week    PT Treatment/Interventions  Patient/family education;Functional mobility training;Moist Heat;Ultrasound;Therapeutic exercise;Passive range of motion;Manual techniques;Neuromuscular re-education;Gait training;Taping    PT Next Visit Plan  slow progression of ther ex, postural realignement and back of the body strengthening. heat PRN for pain       Patient will benefit from skilled therapeutic intervention in order to improve the following deficits and impairments:  Abnormal gait, Decreased range of motion, Difficulty walking, Decreased endurance, Cardiopulmonary status limiting activity, Decreased activity tolerance, Pain, Impaired flexibility, Decreased strength, Decreased mobility  Visit Diagnosis: Acute bilateral low back pain without sciatica  Muscle weakness (generalized)  Abnormal posture     Problem List Patient Active Problem List   Diagnosis Date Noted  . Neck pain 11/07/2019  . Primary osteoarthritis of both knees 11/07/2019  . HLD (hyperlipidemia) 08/15/2019  . Abdominal pain 06/16/2019  . AKI (acute kidney injury) ([place]) 06/15/2019  . Lower abdominal pain 06/14/2019  . Post herpetic neuralgia 11/22/2018  . Cardiac murmur 05/16/2018  . GERD (gastroesophageal reflux disease) 05/16/2018  . Tobacco abuse 09/28/2014  . Pancreatitis, acute 09/28/2014  . Carotid artery disease ([place]) 12/18/2013  . Peripheral vascular disease ([place]) 02/24/2012  . Malignant neoplasm of female breast ([place]) 04/23/2008  . Essential hypertension 04/23/2008  . DIVERTICULOSIS OF COLON 04/23/2008  . CONSTIPATION, CHRONIC 04/23/2008    Scot Jun, PTA 12/25/2019, 4:44 PM  [place], [place], [address] Phone: [phone]   Fax:  [phone]  Name: Linda [name] MRN:  [id number] Date of Birth: 05-25-1936

## 2019-12-28 ENCOUNTER — Encounter: Payer: Self-pay | Admitting: Physical Therapy

## 2019-12-28 ENCOUNTER — Ambulatory Visit: Payer: Medicare Other | Admitting: Physical Therapy

## 2019-12-28 ENCOUNTER — Other Ambulatory Visit: Payer: Self-pay

## 2019-12-28 DIAGNOSIS — R293 Abnormal posture: Secondary | ICD-10-CM

## 2019-12-28 DIAGNOSIS — M6281 Muscle weakness (generalized): Secondary | ICD-10-CM

## 2019-12-28 DIAGNOSIS — M545 Low back pain, unspecified: Secondary | ICD-10-CM

## 2019-12-28 NOTE — Therapy (Signed)
[address] [place], [place], [address] Phone: [phone]   Fax:  [phone] Progress Note Reporting Period 11/16/19 to 12/28/19 for the first 10 visits  See note below for Objective Data and Assessment of Progress/Goals.      Physical Therapy Treatment  Patient Details  Name: Linda [name] MRN: [id number] Date of Birth: November 12, 1936 Referring Provider (PT): Dr Luetta Nutting   Encounter Date: 12/28/2019  PT End of Session - 12/28/19 1643    Visit Number  10    Number of Visits  16    Date for PT Re-Evaluation  01/11/20    Authorization Type  UHC MCR    PT Start Time  5093    PT Stop Time  1635    PT Time Calculation (min)  40 min    Activity Tolerance  Patient tolerated treatment well    Behavior During Therapy  [facility] for tasks assessed/performed       Past Medical History:  Diagnosis Date  . Arthritis   . Breast cancer ([place])   . Constipation   . Diverticulosis of colon (without mention of hemorrhage)   . GERD (gastroesophageal reflux disease)   . Hypertension   . Nail fungus   . Uterine fibroid     Past Surgical History:  Procedure Laterality Date  . ABDOMINAL HYSTERECTOMY    . BREAST LUMPECTOMY Right 2004   radiation and chemo  . CHOLECYSTECTOMY  ~1980  . COLONOSCOPY     Hx; of  . ENDARTERECTOMY Right 01/19/2014   Procedure: ENDARTERECTOMY CAROTID;  Surgeon: Serafina Mitchell, MD;  Location: [facility] OR;  Service: Vascular;  Laterality: Right;  . Right lumpectomy with right sentinel lymph node dissection  2004   Dr. Bubba Camp  . VAGINAL HYSTERECTOMY      There were no vitals filed for this visit.  Subjective Assessment - 12/28/19 1553    Subjective  "Ok"    Currently in Pain?  No/denies                       [facility] Adult PT Treatment/Exercise - 12/28/19 0001      Lumbar Exercises: Aerobic   UBE (Upper Arm Bike)  L 2 2 fwd/2 back    Nustep  L3 x 5 min      Lumbar  Exercises: Machines for Strengthening   Cybex Knee Extension  5lb 2x10     Cybex Knee Flexion  20lb 2x10       Lumbar Exercises: Seated   Sit to Stand  10 reps   5 airex on table, 5 without    Other Seated Lumbar Exercises  ball squeezes 2x10       Shoulder Exercises: Standing   Extension  Strengthening;Both;20 reps;Theraband    Theraband Level (Shoulder Extension)  Level 2 (Red)    Row  Theraband;20 reps;Both    Theraband Level (Shoulder Row)  Level 2 (Red)               PT Short Term Goals - 12/05/19 1558      PT SHORT TERM GOAL #1   Title  I with initial HEP for posture realignement ( 12/14/2019)    Status  Achieved      PT SHORT TERM GOAL #2   Title  ambulate with upright posture at the hips - neutral extension ( 12/14/2019)    Status  Partially Met      PT  SHORT TERM GOAL #3   Title  perform 5' of exercise without SOB ( 12/14/2019)    Status  On-going        PT Long Term Goals - 12/28/19 1646      PT LONG TERM GOAL #2   Title  report ability to tend to her plants with =/> 50% ease ( 01/11/2020)    Status  On-going      PT LONG TERM GOAL #3   Title  report =/> 50% reduction in overall back pain with daily activity ( 01/11/2020)    Status  Achieved      PT LONG TERM GOAL #4   Title  improve bilat hip strength to allow patient to safely transfer sit to stand without UE assist ( 01/11/2020)    Status  Partially Met            Plan - 12/28/19 1644    Clinical Impression Statement  Miss Hollingshead did well progressing with her exercises. She is reporting less back pain overall. Progressed to some machine level LE strengthening without issus. Sit to stand without airex ws taxing on pt. Some difficulty noted with standing rows and extensions forcing her to stand up straight.    Personal Factors and Comorbidities  Age;Comorbidity 3+;Time since onset of injury/illness/exacerbation    Comorbidities  osteoporosis, osteoarthritis, HTN, h/o breast CA    Examination-Activity  Limitations  Bathing;Sleep;Dressing;Transfers;Locomotion Level    Stability/Clinical Decision Making  Evolving/Moderate complexity    Rehab Potential  Good    PT Frequency  2x / week    PT Duration  8 weeks    PT Treatment/Interventions  Patient/family education;Functional mobility training;Moist Heat;Ultrasound;Therapeutic exercise;Passive range of motion;Manual techniques;Neuromuscular re-education;Gait training;Taping    PT Next Visit Plan  slow progression of ther ex, postural realignement and back of the body strengthening. heat PRN for pain       Patient will benefit from skilled therapeutic intervention in order to improve the following deficits and impairments:  Abnormal gait, Decreased range of motion, Difficulty walking, Decreased endurance, Cardiopulmonary status limiting activity, Decreased activity tolerance, Pain, Impaired flexibility, Decreased strength, Decreased mobility  Visit Diagnosis: Acute bilateral low back pain without sciatica  Muscle weakness (generalized)  Abnormal posture     Problem List Patient Active Problem List   Diagnosis Date Noted  . Neck pain 11/07/2019  . Primary osteoarthritis of both knees 11/07/2019  . HLD (hyperlipidemia) 08/15/2019  . Abdominal pain 06/16/2019  . AKI (acute kidney injury) ([place]) 06/15/2019  . Lower abdominal pain 06/14/2019  . Post herpetic neuralgia 11/22/2018  . Cardiac murmur 05/16/2018  . GERD (gastroesophageal reflux disease) 05/16/2018  . Tobacco abuse 09/28/2014  . Pancreatitis, acute 09/28/2014  . Carotid artery disease ([place]) 12/18/2013  . Peripheral vascular disease ([place]) 02/24/2012  . Malignant neoplasm of female breast ([place]) 04/23/2008  . Essential hypertension 04/23/2008  . DIVERTICULOSIS OF COLON 04/23/2008  . CONSTIPATION, CHRONIC 04/23/2008    Scot Jun, PTA 12/28/2019, 4:46 PM  [address] [place], [place],  [address] Phone: [phone]   Fax:  [phone]  Name: MAYANNA GARLITZ MRN: [id number] Date of Birth: [birth year]

## 2020-01-03 ENCOUNTER — Ambulatory Visit: Payer: Medicare Other | Admitting: Physical Therapy

## 2020-01-03 ENCOUNTER — Other Ambulatory Visit: Payer: Self-pay

## 2020-01-03 DIAGNOSIS — M545 Low back pain, unspecified: Secondary | ICD-10-CM

## 2020-01-03 DIAGNOSIS — R293 Abnormal posture: Secondary | ICD-10-CM | POA: Diagnosis not present

## 2020-01-03 DIAGNOSIS — M6281 Muscle weakness (generalized): Secondary | ICD-10-CM

## 2020-01-03 NOTE — Therapy (Signed)
[address] [place], [place], [address] Phone: [phone]   Fax:  [phone]  Physical Therapy Treatment  Patient Details  Name: Linda [name] MRN: [id number] Date of Birth: May 11, 1936 Referring Provider (PT): Dr Luetta Nutting   Encounter Date: 01/03/2020  PT End of Session - 01/03/20 1746    Visit Number  11    Date for PT Re-Evaluation  01/11/20    Authorization Type  UHC MCR    PT Start Time  3335    PT Stop Time  1735    PT Time Calculation (min)  45 min    Activity Tolerance  Patient tolerated treatment well    Behavior During Therapy  [facility] for tasks assessed/performed       Past Medical History:  Diagnosis Date  . Arthritis   . Breast cancer ([place])   . Constipation   . Diverticulosis of colon (without mention of hemorrhage)   . GERD (gastroesophageal reflux disease)   . Hypertension   . Nail fungus   . Uterine fibroid     Past Surgical History:  Procedure Laterality Date  . ABDOMINAL HYSTERECTOMY    . BREAST LUMPECTOMY Right 2004   radiation and chemo  . CHOLECYSTECTOMY  ~1980  . COLONOSCOPY     Hx; of  . ENDARTERECTOMY Right 01/19/2014   Procedure: ENDARTERECTOMY CAROTID;  Surgeon: Serafina Mitchell, MD;  Location: [facility] OR;  Service: Vascular;  Laterality: Right;  . Right lumpectomy with right sentinel lymph node dissection  2004   Dr. Bubba Camp  . VAGINAL HYSTERECTOMY      There were no vitals filed for this visit.  Subjective Assessment - 01/03/20 1653    Subjective  I was really sore in my back after the last treatment    Currently in Pain?  Yes    Pain Score  5     Pain Location  Back    Aggravating Factors   maybe the exercises                       [facility] Adult PT Treatment/Exercise - 01/03/20 0001      Lumbar Exercises: Aerobic   UBE (Upper Arm Bike)  L 2 2 fwd/2 back    Nustep  L3 x 5 min      Lumbar Exercises: Machines for Strengthening   Cybex Knee  Extension  5lb 2x10     Cybex Knee Flexion  20lb 2x10       Lumbar Exercises: Standing   Other Standing Lumbar Exercises  holding walker 3 # hip ext and abd 2x10 each      Lumbar Exercises: Seated   Other Seated Lumbar Exercises  ball squeezes 2x10 , pelvic tilts    Other Seated Lumbar Exercises  gentle passive stretch of the spine in sitting      Shoulder Exercises: Standing   Extension  Strengthening;Both;20 reps;Theraband    Theraband Level (Shoulder Extension)  Level 2 (Red)    Row  Theraband;20 reps;Both    Theraband Level (Shoulder Row)  Level 2 (Red)               PT Short Term Goals - 12/05/19 1558      PT SHORT TERM GOAL #1   Title  I with initial HEP for posture realignement ( 12/14/2019)    Status  Achieved      PT SHORT TERM GOAL #2   Title  ambulate with upright posture at the hips - neutral extension ( 12/14/2019)    Status  Partially Met      PT SHORT TERM GOAL #3   Title  perform 5' of exercise without SOB ( 12/14/2019)    Status  On-going        PT Long Term Goals - 01/03/20 1748      PT LONG TERM GOAL #2   Title  report ability to tend to her plants with =/> 50% ease ( 01/11/2020)    Status  On-going      PT LONG TERM GOAL #3   Title  report =/> 50% reduction in overall back pain with daily activity ( 01/11/2020)    Status  Achieved      PT LONG TERM GOAL #4   Title  improve bilat hip strength to allow patient to safely transfer sit to stand without UE assist ( 01/11/2020)    Status  Partially Met            Plan - 01/03/20 1746    Clinical Impression Statement  Patient had reported that she was sore, but then as I question her she talks more about stiffness, she was non tender and when asked about the exercises she said yes they are good, I replicated the last treatment ans she really did tolerate well, needs a lot of cues for posture and has a hard time correcting, some difficulkty with standing for any length of time and weakness in the hips     PT Next Visit Plan  slow progression of ther ex, postural realignement and back of the body strengthening. heat PRN for pain    Consulted and Agree with Plan of Care  Patient       Patient will benefit from skilled therapeutic intervention in order to improve the following deficits and impairments:  Abnormal gait, Decreased range of motion, Difficulty walking, Decreased endurance, Cardiopulmonary status limiting activity, Decreased activity tolerance, Pain, Impaired flexibility, Decreased strength, Decreased mobility  Visit Diagnosis: Acute bilateral low back pain without sciatica  Muscle weakness (generalized)  Abnormal posture     Problem List Patient Active Problem List   Diagnosis Date Noted  . Neck pain 11/07/2019  . Primary osteoarthritis of both knees 11/07/2019  . HLD (hyperlipidemia) 08/15/2019  . Abdominal pain 06/16/2019  . AKI (acute kidney injury) ([facility]) 06/15/2019  . Lower abdominal pain 06/14/2019  . Post herpetic neuralgia 11/22/2018  . Cardiac murmur 05/16/2018  . GERD (gastroesophageal reflux disease) 05/16/2018  . Tobacco abuse 09/28/2014  . Pancreatitis, acute 09/28/2014  . Carotid artery disease ([facility]) 12/18/2013  . Peripheral vascular disease ([facility]) 02/24/2012  . Malignant neoplasm of female breast ([facility]) 04/23/2008  . Essential hypertension 04/23/2008  . DIVERTICULOSIS OF COLON 04/23/2008  . CONSTIPATION, CHRONIC 04/23/2008    ALBRIGHT,MICHAEL W., PT 01/03/2020, 5:49 PM  [facility] [address] [place], [place], [address] Phone: [phone]   Fax:  [phone]  Name: Tijah K Dermody MRN: [id number] Date of Birth: 09/30/1936   

## 2020-01-04 ENCOUNTER — Other Ambulatory Visit: Payer: Self-pay | Admitting: Family Medicine

## 2020-01-09 ENCOUNTER — Other Ambulatory Visit: Payer: Self-pay

## 2020-01-09 ENCOUNTER — Ambulatory Visit: Payer: Medicare Other | Attending: Family Medicine | Admitting: Physical Therapy

## 2020-01-09 ENCOUNTER — Encounter: Payer: Self-pay | Admitting: Physical Therapy

## 2020-01-09 DIAGNOSIS — M545 Low back pain, unspecified: Secondary | ICD-10-CM

## 2020-01-09 DIAGNOSIS — M6281 Muscle weakness (generalized): Secondary | ICD-10-CM | POA: Diagnosis not present

## 2020-01-09 DIAGNOSIS — R293 Abnormal posture: Secondary | ICD-10-CM | POA: Insufficient documentation

## 2020-01-09 NOTE — Therapy (Signed)
[place], [place], [address] Phone: [phone]   Fax:  [phone]  Physical Therapy Treatment  Patient Details  Name: Linda [name] MRN: [id number] Date of Birth: 11-Aug-1936 Referring Provider (PT): Dr Luetta Nutting   Encounter Date: 01/09/2020  PT End of Session - 01/09/20 1639    Visit Number  12    Date for PT Re-Evaluation  01/11/20    Authorization Type  UHC MCR    PT Start Time  2376    PT Stop Time  1640    PT Time Calculation (min)  45 min    Activity Tolerance  Patient tolerated treatment well    Behavior During Therapy  [facility] for tasks assessed/performed       Past Medical History:  Diagnosis Date  . Arthritis   . Breast cancer ([place])   . Constipation   . Diverticulosis of colon (without mention of hemorrhage)   . GERD (gastroesophageal reflux disease)   . Hypertension   . Nail fungus   . Uterine fibroid     Past Surgical History:  Procedure Laterality Date  . ABDOMINAL HYSTERECTOMY    . BREAST LUMPECTOMY Right 2004   radiation and chemo  . CHOLECYSTECTOMY  ~1980  . COLONOSCOPY     Hx; of  . ENDARTERECTOMY Right 01/19/2014   Procedure: ENDARTERECTOMY CAROTID;  Surgeon: Serafina Mitchell, MD;  Location: [facility] OR;  Service: Vascular;  Laterality: Right;  . Right lumpectomy with right sentinel lymph node dissection  2004   Dr. Bubba Camp  . VAGINAL HYSTERECTOMY      There were no vitals filed for this visit.  Subjective Assessment - 01/09/20 1558    Subjective  "Today I have not had any problems, I moped my kitchen and bathroom floor"    Currently in Pain?  No/denies                       [facility] Adult PT Treatment/Exercise - 01/09/20 0001      Lumbar Exercises: Aerobic   UBE (Upper Arm Bike)  L 2 2 fwd/2 back    Nustep  L3 x 5 min      Lumbar Exercises: Machines for Strengthening   Cybex Knee Extension  5lb 2x10     Cybex Knee Flexion  20lb 2x10       Lumbar Exercises: Standing   Other Standing Lumbar Exercises  holding walker 3 # hip ext and abd 2x10 each      Lumbar Exercises: Seated   Long Arc Quad on Chair  Strengthening;Both;2 sets;10 reps;Weights    LAQ on Chair Weights (lbs)  3    Other Seated Lumbar Exercises  ball squeezes 2x10 , pelvic tilts      Shoulder Exercises: Standing   Row  Theraband;20 reps;Both    Theraband Level (Shoulder Row)  Level 2 (Red)               PT Short Term Goals - 12/05/19 1558      PT SHORT TERM GOAL #1   Title  I with initial HEP for posture realignement ( 12/14/2019)    Status  Achieved      PT SHORT TERM GOAL #2   Title  ambulate with upright posture at the hips - neutral extension ( 12/14/2019)    Status  Partially Met      PT SHORT TERM GOAL #3   Title  perform  5' of exercise without SOB ( 12/14/2019)    Status  On-going        PT Long Term Goals - 01/03/20 1748      PT LONG TERM GOAL #2   Title  report ability to tend to her plants with =/> 50% ease ( 01/11/2020)    Status  On-going      PT LONG TERM GOAL #3   Title  report =/> 50% reduction in overall back pain with daily activity ( 01/11/2020)    Status  Achieved      PT LONG TERM GOAL #4   Title  improve bilat hip strength to allow patient to safely transfer sit to stand without UE assist ( 01/11/2020)    Status  Partially Met            Plan - 01/09/20 1640    Clinical Impression Statement  Pt continues to progress well. Cues throughout session for posture. fatigues quick with standing interventions. Hip did fatigue with standing exercises. Difficulty achieving full TKE with seated extension on machine. She was able to achieve full ext with LAQ.    Personal Factors and Comorbidities  Age;Comorbidity 3+;Time since onset of injury/illness/exacerbation    Comorbidities  osteoporosis, osteoarthritis, HTN, h/o breast CA    Examination-Activity Limitations  Bathing;Sleep;Dressing;Transfers;Locomotion Level     Examination-Participation Restrictions  Other;Yard Work;Cleaning;Meal Prep    Stability/Clinical Decision Making  Evolving/Moderate complexity    Rehab Potential  Good    PT Frequency  2x / week    PT Duration  8 weeks    PT Next Visit Plan  slow progression of ther ex, postural realignement and back of the body strengthening. heat PRN for pain       Patient will benefit from skilled therapeutic intervention in order to improve the following deficits and impairments:  Abnormal gait, Decreased range of motion, Difficulty walking, Decreased endurance, Cardiopulmonary status limiting activity, Decreased activity tolerance, Pain, Impaired flexibility, Decreased strength, Decreased mobility  Visit Diagnosis: Acute bilateral low back pain without sciatica  Muscle weakness (generalized)  Abnormal posture     Problem List Patient Active Problem List   Diagnosis Date Noted  . Neck pain 11/07/2019  . Primary osteoarthritis of both knees 11/07/2019  . HLD (hyperlipidemia) 08/15/2019  . Abdominal pain 06/16/2019  . AKI (acute kidney injury) ([place]) 06/15/2019  . Lower abdominal pain 06/14/2019  . Post herpetic neuralgia 11/22/2018  . Cardiac murmur 05/16/2018  . GERD (gastroesophageal reflux disease) 05/16/2018  . Tobacco abuse 09/28/2014  . Pancreatitis, acute 09/28/2014  . Carotid artery disease ([place]) 12/18/2013  . Peripheral vascular disease ([place]) 02/24/2012  . Malignant neoplasm of female breast ([place]) 04/23/2008  . Essential hypertension 04/23/2008  . DIVERTICULOSIS OF COLON 04/23/2008  . CONSTIPATION, CHRONIC 04/23/2008    Scot Jun, PTA 01/09/2020, 4:43 PM  [place] [address] [address] [place], [place], [address] Phone: [phone]   Fax:  [phone]  Name: Linda [name] MRN: [id number] Date of Birth: 06/10/1936

## 2020-01-11 ENCOUNTER — Ambulatory Visit: Payer: Medicare Other | Admitting: Physical Therapy

## 2020-01-19 DIAGNOSIS — I6523 Occlusion and stenosis of bilateral carotid arteries: Secondary | ICD-10-CM | POA: Diagnosis not present

## 2020-01-19 DIAGNOSIS — M170 Bilateral primary osteoarthritis of knee: Secondary | ICD-10-CM | POA: Diagnosis not present

## 2020-01-19 DIAGNOSIS — Z79899 Other long term (current) drug therapy: Secondary | ICD-10-CM | POA: Diagnosis not present

## 2020-01-19 DIAGNOSIS — I5032 Chronic diastolic (congestive) heart failure: Secondary | ICD-10-CM | POA: Diagnosis not present

## 2020-01-19 DIAGNOSIS — E559 Vitamin D deficiency, unspecified: Secondary | ICD-10-CM | POA: Diagnosis not present

## 2020-02-19 ENCOUNTER — Other Ambulatory Visit: Payer: Self-pay | Admitting: Family Medicine

## 2020-02-19 ENCOUNTER — Other Ambulatory Visit: Payer: Self-pay

## 2020-03-04 ENCOUNTER — Other Ambulatory Visit: Payer: Self-pay | Admitting: Family Medicine

## 2020-03-09 ENCOUNTER — Other Ambulatory Visit: Payer: Self-pay | Admitting: Family Medicine

## 2020-03-14 ENCOUNTER — Other Ambulatory Visit: Payer: Self-pay | Admitting: Family Medicine

## 2020-03-14 NOTE — Telephone Encounter (Signed)
Spoke with patient checking to see if she prefer to stay here at [facility] location or if she was going to transfer with Dr. Zigmund Daniel. Per patient she was unsure at the time of what she was going to do with finding a new provider but she would give [place] a call to let us know if she would like to stay at [place] location. Patient also states that she does not need any refills on medications at this time refill request refused.

## 2020-04-02 DIAGNOSIS — I130 Hypertensive heart and chronic kidney disease with heart failure and stage 1 through stage 4 chronic kidney disease, or unspecified chronic kidney disease: Secondary | ICD-10-CM | POA: Diagnosis not present

## 2020-04-02 DIAGNOSIS — I6523 Occlusion and stenosis of bilateral carotid arteries: Secondary | ICD-10-CM | POA: Diagnosis not present

## 2020-04-02 DIAGNOSIS — Z008 Encounter for other general examination: Secondary | ICD-10-CM | POA: Diagnosis not present

## 2020-04-02 DIAGNOSIS — I5032 Chronic diastolic (congestive) heart failure: Secondary | ICD-10-CM | POA: Diagnosis not present

## 2020-04-02 DIAGNOSIS — Z0001 Encounter for general adult medical examination with abnormal findings: Secondary | ICD-10-CM | POA: Diagnosis not present

## 2020-04-04 ENCOUNTER — Other Ambulatory Visit: Payer: Self-pay | Admitting: Family Medicine

## 2020-04-08 ENCOUNTER — Telehealth: Payer: Self-pay

## 2020-04-08 NOTE — Telephone Encounter (Signed)
NOTES ON FILE FROM [facility] [phone], SENT REFERRAL TO SCHEDULING 

## 2020-05-08 ENCOUNTER — Other Ambulatory Visit: Payer: Self-pay

## 2020-05-08 ENCOUNTER — Encounter: Payer: Self-pay | Admitting: Cardiology

## 2020-05-08 ENCOUNTER — Ambulatory Visit: Payer: Medicare Other | Admitting: Cardiology

## 2020-05-08 VITALS — BP 160/80 | HR 86 | Ht 61.0 in | Wt 153.8 lb

## 2020-05-08 DIAGNOSIS — I421 Obstructive hypertrophic cardiomyopathy: Secondary | ICD-10-CM | POA: Diagnosis not present

## 2020-05-08 DIAGNOSIS — R011 Cardiac murmur, unspecified: Secondary | ICD-10-CM | POA: Diagnosis not present

## 2020-05-08 NOTE — Patient Instructions (Signed)
Medication Instructions:  °The current medical regimen is effective;  continue present plan and medications. ° °*If you need a refill on your cardiac medications before your next appointment, please call your pharmacy* ° °Testing/Procedures: °Your physician has requested that you have an echocardiogram. Echocardiography is a painless test that uses sound waves to create images of your heart. It provides your doctor with information about the size and shape of your heart and how well your heart’s chambers and valves are working. This procedure takes approximately one hour. There are no restrictions for this procedure. ° °Follow-Up: °At [facility], you and your health needs are our priority.  As part of our continuing mission to provide you with exceptional heart care, we have created designated Provider Care Teams.  These Care Teams include your primary Cardiologist (physician) and Advanced Practice Providers (APPs -  Physician Assistants and Nurse Practitioners) who all work together to provide you with the care you need, when you need it. ° °We recommend signing up for the patient portal called "MyChart".  Sign up information is provided on this After Visit Summary.  MyChart is used to connect with patients for Virtual Visits (Telemedicine).  Patients are able to view lab/test results, encounter notes, upcoming appointments, etc.  Non-urgent messages can be sent to your provider as well.   °To learn more about what you can do with MyChart, go to [email or web address]://[email or web address].com.   ° °Your next appointment:   °6 month(s) ° °The format for your next appointment:   °In Person ° °Provider:   °Mark Skains, MD { ° ° ° °Thank you for choosing [facility]!! ° ° ° °

## 2020-05-08 NOTE — Progress Notes (Signed)
Cardiology Office Note:    Date:  [id number]   ID:  [name], DOB [id number], MRN [id number]  PCP:  Sonia Side., FNP  Cardiologist:  Candee Furbish, MD  Electrophysiologist:  None   Referring MD: Sonia Side., FNP     History of Present Illness:    Linda [name] is a 84 y.o. female here for evaluation of  Heart murmur 3/6 HSM, HTN, CKD 3a. PAD as well. She has a combination of inflow disease and superficial femoral artery occlusion. ABIs were done prior and they are 0.34 on the right and 0.66 on the left. This is stable from her previous study. Know left knee arthritis. Cane helps. No syncope. No recent falls. If gets up quick, mini dizzy spell. Careful.  Previously saw cardiology several years ago. Requested visit.  Prior ECHO 2004:  SUMMARY  - The left ventricle was mildly dilated. Overall left ventricular     systolic function was normal. Left ventricular ejection     fraction was estimated , range being 55 % to 65 %. Left     ventricular wall thickness was moderately increased. There     was Doppler evidence for a dynamic left ventricular outflow     tract obstruction at rest, with a peak velocity of 3 m/sec ,     and with a peak gradient of 36 mmHg.  - The aortic valve was mildly to moderately calcified.  - There was mild mitral valvular regurgitation.  - The left atrium was mildly dilated.  - Severe basal septal hypertrophy with HOCM physiology. Poor image     quality makes SAM hard to visualize. LVOT gradient hard to     quantitate but peak velocity probably in the 2-65m/sec range.  IMPRESSIONS  - Severe basal septal hypertrophy with HOCM physiology. Poor image     quality makes SAM hard to visualize. LVOT gradient hard to     quantitate but peak velocity probably in the 2-3m/sec range.   Mother and father had heart problems.  Past Medical History:  Diagnosis Date   Abdominal pain 06/16/2019   AKI  (acute kidney injury) ([place]) 06/15/2019   Arthritis    Breast cancer ([place])    Cardiac murmur 05/16/2018   Carotid artery disease ([place]) 12/18/2013   Constipation    CONSTIPATION, CHRONIC 04/23/2008   Qualifier: Diagnosis of  By: Harlon Ditty CMA (AAMA), Dottie     Diverticulosis of colon (without mention of hemorrhage)    Essential hypertension 04/23/2008   Qualifier: Diagnosis of  By: Nelson-Smith CMA (AAMA), Dottie     GERD (gastroesophageal reflux disease)    HLD (hyperlipidemia) 08/15/2019   Hypertension    Lower abdominal pain 06/14/2019   Malignant neoplasm of female breast ([place]) 04/23/2008   Qualifier: History of  By: Harlon Ditty CMA (AAMA), Dottie     Nail fungus    Neck pain 11/07/2019   Pancreatitis, acute 09/28/2014   Peripheral vascular disease ([place]) 02/24/2012   Post herpetic neuralgia 11/22/2018   Primary osteoarthritis of both knees 11/07/2019   Tobacco abuse 09/28/2014   Uterine fibroid     Past Surgical History:  Procedure Laterality Date   ABDOMINAL HYSTERECTOMY     BREAST LUMPECTOMY Right 2004   radiation and chemo   CHOLECYSTECTOMY  ~1980   COLONOSCOPY     Hx; of   ENDARTERECTOMY Right 01/19/2014   Procedure: ENDARTERECTOMY CAROTID;  Surgeon: Serafina Mitchell, MD;  Location: [facility];  Service: Vascular;  Laterality: Right;   Right lumpectomy with right sentinel lymph node dissection  2004   Dr. Bubba Camp   VAGINAL HYSTERECTOMY      Current Medications: Current Meds  Medication Sig   aspirin EC 81 MG tablet Take 1 tablet (81 mg total) by mouth daily.   atorvastatin (LIPITOR) 40 MG tablet TAKE 1 TABLET BY MOUTH EACH EVENING   dexlansoprazole (DEXILANT) 60 MG capsule Take 1qd (Plz sched appt with new provider)   gabapentin (NEURONTIN) 300 MG capsule Take 1qd (Plz sched with new provider)   hydrALAZINE (APRESOLINE) 25 MG tablet TAKE 1 TABLET BY MOUTH FOUR TIMES A DAY   verapamil (VERELAN PM) 120 MG 24 hr capsule TAKE 2 CAPSULES (240 MG  TOTAL) BY MOUTH AT BEDTIME.     Allergies:   Patient has no known allergies.   Social History   Socioeconomic History   Marital status: Single    Spouse name: Not on file   Number of children: 2   Years of education: Not on file   Highest education level: Not on file  Occupational History   Occupation: Retired  Tobacco Use   Smoking status: Current Every Day Smoker    Packs/day: 1.00    Types: Cigarettes   Smokeless tobacco: Never Used   Tobacco comment: form given 12/14/11  Substance and Sexual Activity   Alcohol use: No    Alcohol/week: 0.0 standard drinks   Drug use: No   Sexual activity: Never  Other Topics Concern   Not on file  Social History Narrative   Not on file   Social Determinants of Health   Financial Resource Strain:    Difficulty of Paying Living Expenses:   Food Insecurity:    Worried About Charity fundraiser in the Last Year:    Arboriculturist in the Last Year:   Transportation Needs:    Film/video editor (Medical):    Lack of Transportation (Non-Medical):   Physical Activity:    Days of Exercise per Week:    Minutes of Exercise per Session:   Stress:    Feeling of Stress :   Social Connections:    Frequency of Communication with Friends and Family:    Frequency of Social Gatherings with Friends and Family:    Attends Religious Services:    Active Member of Clubs or Organizations:    Attends Music therapist:    Marital Status:      Family History: The patient's family history includes Cancer in her mother; Colon cancer in her maternal grandmother and mother; Pneumonia (age of onset: 55) in her father.  ROS:   Please see the history of present illness.    Denies any chest pain shortness of breath syncope bleeding all other systems reviewed and are negative.  EKGs/Labs/Other Studies Reviewed:    The following studies were reviewed today: Prior echo as above.  EKG:  EKG is  ordered today.   The ekg ordered today demonstrates sinus rhythm poor R wave progression 86 bpm  Recent Labs: 06/16/2019: B Natriuretic Peptide 208.6 06/20/2019: Magnesium 2.0 08/15/2019: ALT 9; Hemoglobin 12.2; Platelets 171.0 08/23/2019: BUN 18; Creatinine, Ser 0.72; Potassium 4.8; Sodium 144  Recent Lipid Panel    Component Value Date/Time   CHOL 113 11/22/2018 1104   TRIG 114.0 11/22/2018 1104   HDL 46.20 11/22/2018 1104   CHOLHDL 2 11/22/2018 1104   VLDL 22.8 11/22/2018 1104   LDLCALC 44 11/22/2018 1104    Physical Exam:  VS:  BP (!) 160/80    Pulse 86    Ht 5\' 1"  (1.549 m)    Wt 153 lb 12.8 oz (69.8 kg)    SpO2 92%    BMI 29.06 kg/m     Wt Readings from Last 3 Encounters:  05/08/20 153 lb 12.8 oz (69.8 kg)  11/07/19 148 lb (67.1 kg)  08/15/19 150 lb (68 kg)     GEN: Well nourished, well developed in no acute distress, uses a walker HEENT: Normal NECK: No JVD; No carotid bruits LYMPHATICS: No lymphadenopathy CARDIAC: RRR, 3/6 SM, no rubs, gallops RESPIRATORY:  Clear to auscultation without rales, wheezing or rhonchi  ABDOMEN: Soft, non-tender, non-distended MUSCULOSKELETAL:  No edema; No deformity, kyphosis SKIN: Warm and dry NEUROLOGIC:  Alert and oriented x 3 PSYCHIATRIC:  Normal affect   ASSESSMENT:    1. HOCM (hypertrophic obstructive cardiomyopathy) (HCC)   2. Cardiac murmur    PLAN:    In order of problems listed above:  Hypertrophic cardiomyopathy --3/6 SM noted. Will repeat ECHO. --Continue with BP control, avoidance of dehydration which can exacerbate outflow tract gradient.  I am fine with her having a slightly elevated blood pressure especially because when she stands up, her blood pressures may drop significantly given her outflow tract obstruction.  Hypertensive heart diease --Renal artery duplex 2016 - No renal artery stenosis.   Tobacco use --encourage cessation  Carotid disease --Consider checking ultrasound at next visit    Medication  Adjustments/Labs and Tests Ordered: Current medicines are reviewed at length with the patient today.  Concerns regarding medicines are outlined above.  Orders Placed This Encounter  Procedures   EKG 12-Lead   ECHOCARDIOGRAM COMPLETE   No orders of the defined types were placed in this encounter.   Patient Instructions  Medication Instructions:  The current medical regimen is effective;  continue present plan and medications.  *If you need a refill on your cardiac medications before your next appointment, please call your pharmacy*  Testing/Procedures: Your physician has requested that you have an echocardiogram. Echocardiography is a painless test that uses sound waves to create images of your heart. It provides your doctor with information about the size and shape of your heart and how well your hearts chambers and valves are working. This procedure takes approximately one hour. There are no restrictions for this procedure.  Follow-Up: At [facility] [place], you and your health needs are our priority.  As part of our continuing mission to provide you with exceptional heart care, we have created designated Provider Care Teams.  These Care Teams include your primary Cardiologist (physician) and Advanced Practice Providers (APPs -  Physician Assistants and Nurse Practitioners) who all work together to provide you with the care you need, when you need it.  We recommend signing up for the patient portal called "MyChart".  Sign up information is provided on this After Visit Summary.  MyChart is used to connect with patients for Virtual Visits (Telemedicine).  Patients are able to view lab/test results, encounter notes, upcoming appointments, etc.  Non-urgent messages can be sent to your provider as well.   To learn more about what you can do with MyChart, go to [email or web address].    Your next appointment:   6 month(s)  The format for your next appointment:   In Person  Provider:     Candee Furbish, MD   Thank you for choosing [facility]!!         Signed, Candee Furbish, MD  05/08/2020 11:20 AM    [facility]

## 2020-05-16 ENCOUNTER — Other Ambulatory Visit: Payer: Self-pay | Admitting: Family Medicine

## 2020-05-30 ENCOUNTER — Ambulatory Visit (HOSPITAL_COMMUNITY): Payer: Medicare Other | Attending: Cardiology

## 2020-05-30 ENCOUNTER — Other Ambulatory Visit: Payer: Self-pay

## 2020-05-30 DIAGNOSIS — R011 Cardiac murmur, unspecified: Secondary | ICD-10-CM | POA: Diagnosis present

## 2020-05-30 DIAGNOSIS — I421 Obstructive hypertrophic cardiomyopathy: Secondary | ICD-10-CM | POA: Diagnosis present

## 2020-05-31 ENCOUNTER — Telehealth: Payer: Self-pay

## 2020-05-31 NOTE — Telephone Encounter (Signed)
-----   Message from Jerline Pain, MD sent at 05/31/2020  8:38 AM EDT ----- Similar findings to prior echo.  Murmur is from accelerated blood flow through left ventricular outflow tract, not aortic stenosis.   Continue with current management   Candee Furbish MD

## 2020-05-31 NOTE — Telephone Encounter (Signed)
The patient has been notified of the result and verbalized understanding.  All questions (if any) were answered. Wilma Flavin, RN 05/31/2020 8:48 AM

## 2020-06-18 ENCOUNTER — Other Ambulatory Visit: Payer: Self-pay | Admitting: Family Medicine

## 2020-06-18 NOTE — Telephone Encounter (Signed)
Last OV 12/010/20 w/Dr. Zigmund Daniel Last fill 02/19/20  #90/0 Ok to fill?

## 2020-07-15 ENCOUNTER — Other Ambulatory Visit: Payer: Self-pay | Admitting: Family Medicine

## 2020-07-19 ENCOUNTER — Other Ambulatory Visit: Payer: Self-pay | Admitting: Nephrology

## 2020-07-19 DIAGNOSIS — N1831 Chronic kidney disease, stage 3a: Secondary | ICD-10-CM

## 2020-07-25 ENCOUNTER — Ambulatory Visit
Admission: RE | Admit: 2020-07-25 | Discharge: 2020-07-25 | Disposition: A | Discharge status: Home / Self Care | Payer: Medicare Other | Source: Ambulatory Visit | Attending: Nephrology | Admitting: Nephrology

## 2020-07-25 DIAGNOSIS — N1831 Chronic kidney disease, stage 3a: Secondary | ICD-10-CM

## 2020-07-31 ENCOUNTER — Other Ambulatory Visit: Payer: Self-pay | Admitting: Family Medicine

## 2020-10-07 ENCOUNTER — Other Ambulatory Visit: Payer: Self-pay | Admitting: Family Medicine

## 2020-10-07 NOTE — Telephone Encounter (Signed)
Last fill 06/18/20 #90/0 Patient has established care with a different PCP.

## 2020-10-24 ENCOUNTER — Other Ambulatory Visit: Payer: Self-pay | Admitting: Family Medicine

## 2020-11-09 ENCOUNTER — Other Ambulatory Visit: Payer: Self-pay | Admitting: Family Medicine

## 2020-12-25 ENCOUNTER — Other Ambulatory Visit: Payer: Self-pay | Admitting: Family Medicine

## 2021-02-06 ENCOUNTER — Other Ambulatory Visit: Payer: Self-pay | Admitting: Family

## 2021-02-18 ENCOUNTER — Encounter: Payer: Self-pay | Admitting: Podiatry

## 2021-02-18 ENCOUNTER — Other Ambulatory Visit: Payer: Self-pay

## 2021-02-18 ENCOUNTER — Ambulatory Visit: Payer: Medicare Other | Admitting: Podiatry

## 2021-02-18 DIAGNOSIS — Z720 Tobacco use: Secondary | ICD-10-CM | POA: Diagnosis not present

## 2021-02-18 DIAGNOSIS — I739 Peripheral vascular disease, unspecified: Secondary | ICD-10-CM

## 2021-02-18 DIAGNOSIS — M79675 Pain in left toe(s): Secondary | ICD-10-CM | POA: Diagnosis not present

## 2021-02-18 DIAGNOSIS — B351 Tinea unguium: Secondary | ICD-10-CM | POA: Diagnosis not present

## 2021-02-18 DIAGNOSIS — M79674 Pain in right toe(s): Secondary | ICD-10-CM

## 2021-02-18 NOTE — Progress Notes (Signed)
  Subjective:  Patient ID: Linda [name], female    DOB: 29-May-1936,  MRN: [id number]  Chief Complaint  Patient presents with  . Nail Problem    Nail trim     85 y.o. female presents with the above complaint. History confirmed with patient.  Nails are thickened elongated she cannot cut them herself  Objective:  Physical Exam: warm, good capillary refill, DP reduced bilateral, no trophic changes or ulcerative lesions, normal sensory exam and PT reduced bilateral.  Thickened elongated brown toenails with subungual debris Assessment:   1. Tobacco abuse   2. Peripheral vascular disease ([place])   3. Onychomycosis   4. Pain due to onychomycosis of toenails of both feet      Plan:  Patient was evaluated and treated and all questions answered.  Discussed the etiology and treatment options for the condition in detail with the patient. Educated patient on the topical and oral treatment options for mycotic nails. Recommended debridement of the nails today. Sharp and mechanical debridement performed of all painful and mycotic nails today. Nails debrided in length and thickness using a nail nipper to level of comfort. Discussed treatment options including appropriate shoe gear. Follow up as needed for painful nails.   Return in about 3 months (around 05/21/2021) for painful nails.

## 2021-02-28 ENCOUNTER — Other Ambulatory Visit: Payer: Self-pay | Admitting: Family

## 2021-03-16 ENCOUNTER — Other Ambulatory Visit: Payer: Self-pay | Admitting: Family

## 2021-06-03 ENCOUNTER — Encounter: Payer: Self-pay | Admitting: Podiatry

## 2021-06-03 ENCOUNTER — Ambulatory Visit: Payer: Medicare Other | Admitting: Podiatry

## 2021-06-03 ENCOUNTER — Other Ambulatory Visit: Payer: Self-pay

## 2021-06-03 DIAGNOSIS — B351 Tinea unguium: Secondary | ICD-10-CM

## 2021-06-03 DIAGNOSIS — I739 Peripheral vascular disease, unspecified: Secondary | ICD-10-CM | POA: Diagnosis not present

## 2021-06-03 DIAGNOSIS — M79675 Pain in left toe(s): Secondary | ICD-10-CM

## 2021-06-03 DIAGNOSIS — M79674 Pain in right toe(s): Secondary | ICD-10-CM | POA: Diagnosis not present

## 2021-06-03 NOTE — Progress Notes (Signed)
  Subjective:  Patient ID: Linda [name], female    DOB: 02/12/1936,  MRN: [id number]  Linda [name] presents to clinic today for for at risk foot care. Patient has h/o PAD and painful thick toenails that are difficult to trim. Pain interferes with ambulation. Aggravating factors include wearing enclosed shoe gear. Pain is relieved with periodic professional debridement.  She states she her right knee is bothering her this morning. She has arthritis.  PCP is Sonia Side., FNP , and last visit was April, 2022 per patient.  No Known Allergies  Review of Systems: Negative except as noted in the HPI. Objective:   Constitutional Linda [name] is a pleasant 85 y.o. African American female, in NAD. AAO x 3.   Vascular Capillary fill time to digits <3 seconds b/l lower extremities. Faintly palpable DP pulse(s) b/l lower extremities. Faintly palpable PT pulse(s) b/l lower extremities. Pedal hair absent. Lower extremity skin temperature gradient within normal limits. No pain with calf compression b/l. No edema noted b/l lower extremities. No cyanosis or clubbing noted.  Neurologic Normal speech. Oriented to person, place, and time. Protective sensation intact 5/5 intact bilaterally with 10g monofilament b/l. Vibratory sensation intact b/l.  Dermatologic Pedal skin is thin shiny, atrophic b/l lower extremities. No open wounds b/l lower extremities No interdigital macerations b/l lower extremities Toenails 1-5 b/l elongated, discolored, dystrophic, thickened, crumbly with subungual debris and tenderness to dorsal palpation.  Orthopedic: Normal muscle strength 5/5 to all lower extremity muscle groups bilaterally. No pain crepitus or joint limitation noted with ROM b/l. No gross bony deformities bilaterally. Utilizes cane for ambulation assistance.   Radiographs: None Assessment:   1. Pain due to onychomycosis of toenails of both feet   2. Peripheral vascular disease ([place])    Plan:  Patient was  evaluated and treated and all questions answered.  Onychomycosis with pain -Nails palliatively debridement as below -Educated on self-care  Procedure: Nail Debridement Rationale: Pain Type of Debridement: manual, sharp debridement. Instrumentation: Nail nipper, rotary burr. Number of Nails: 10 -Examined patient. -No new findings. No new orders. -Patient to continue soft, supportive shoe gear daily. -Toenails 1-5 b/l were debrided in length and girth with sterile nail nippers and dremel without iatrogenic bleeding.  -Patient to report any pedal injuries to medical professional immediately. -Patient/POA to call should there be question/concern in the interim.  Return in about 3 months (around 09/03/2021).  Marzetta Board, DPM

## 2021-08-04 IMAGING — DX DG CERVICAL SPINE COMPLETE 4+V
4 series · 4 of 4 positions shown · non-contrast
Comparison: None.

CLINICAL DATA: Neck pain

EXAM:
CERVICAL SPINE - COMPLETE 4+ VIEW

[cervical spine oblique (1 of 2)]
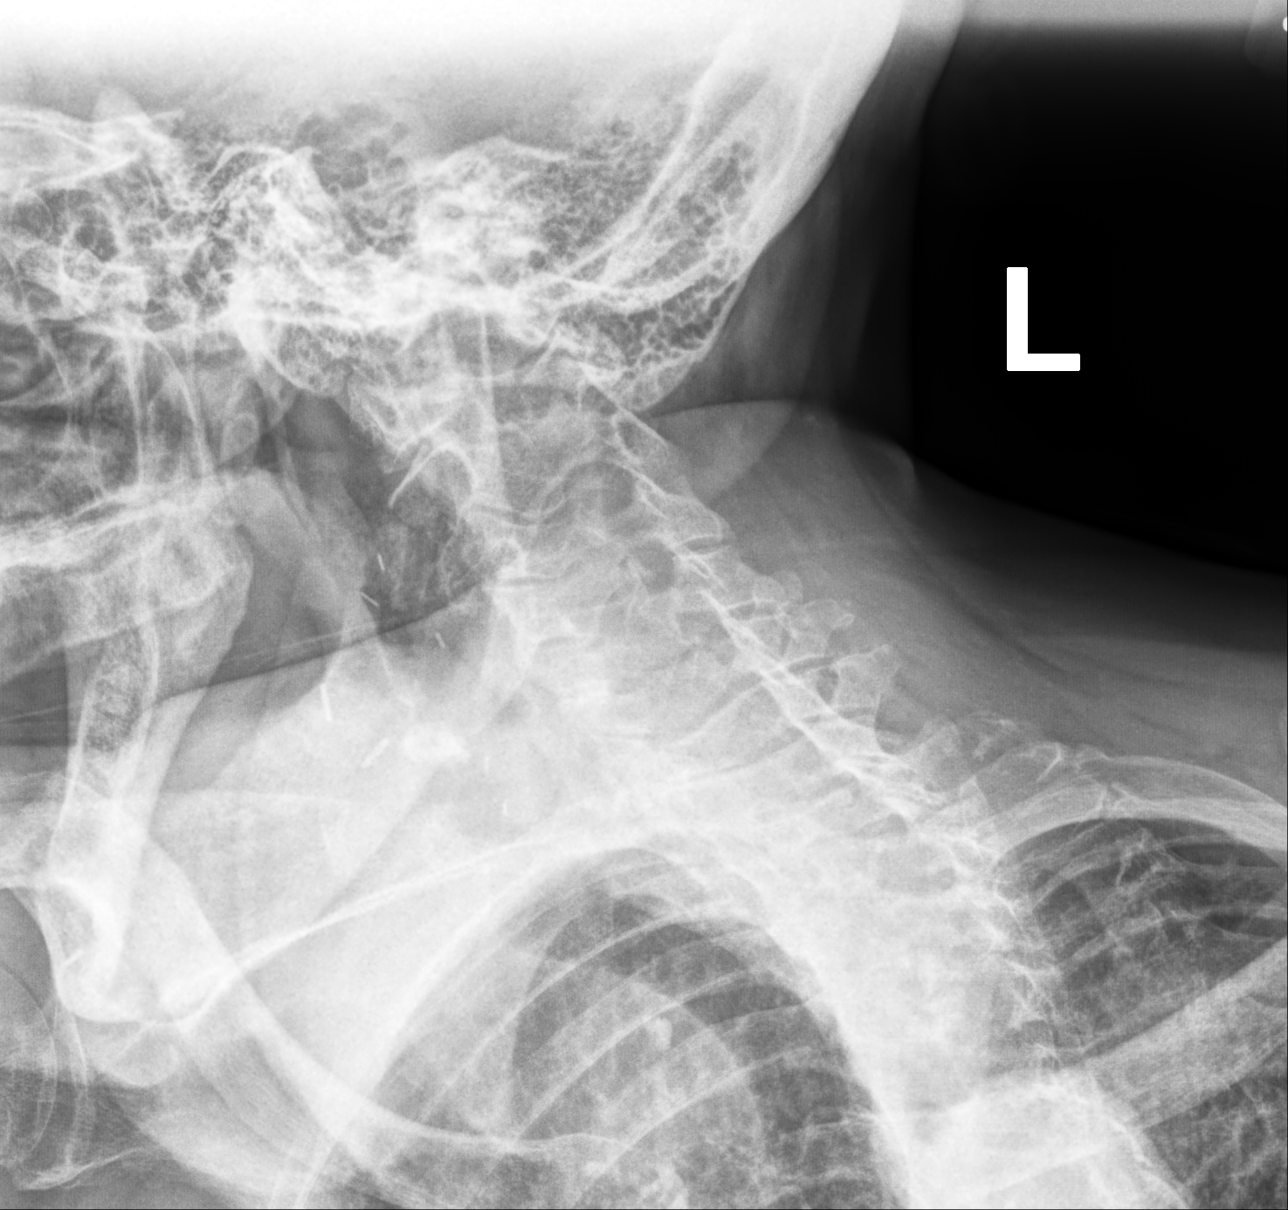

[cervical spine oblique (2 of 2)]
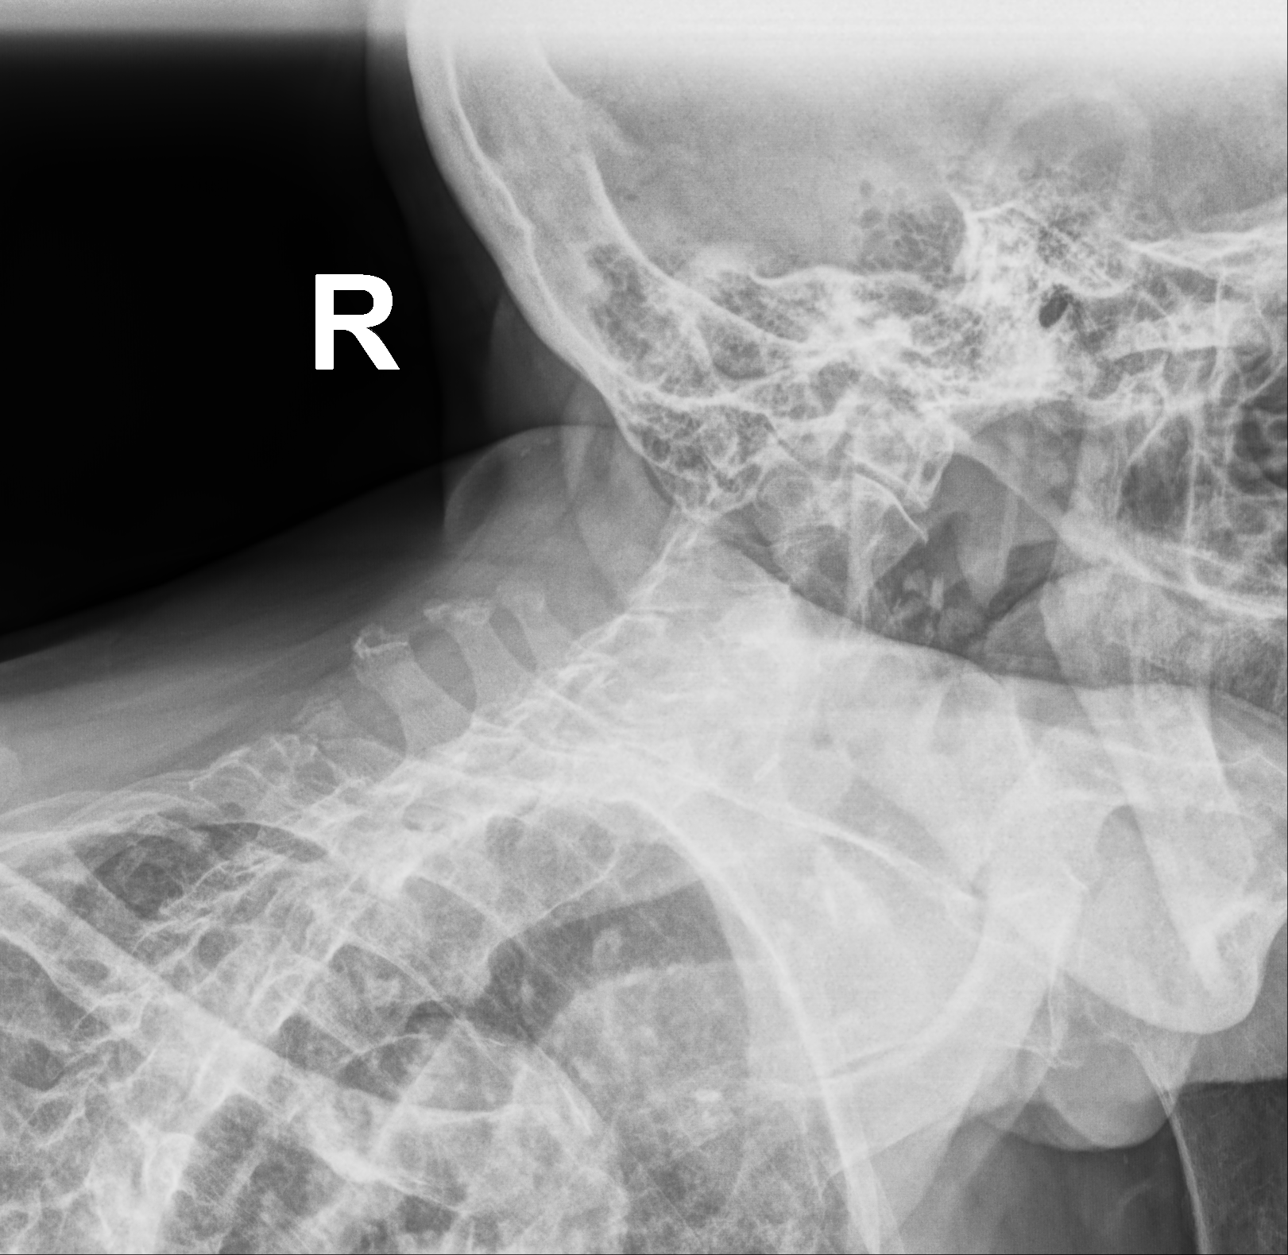

[cervical spine lat]
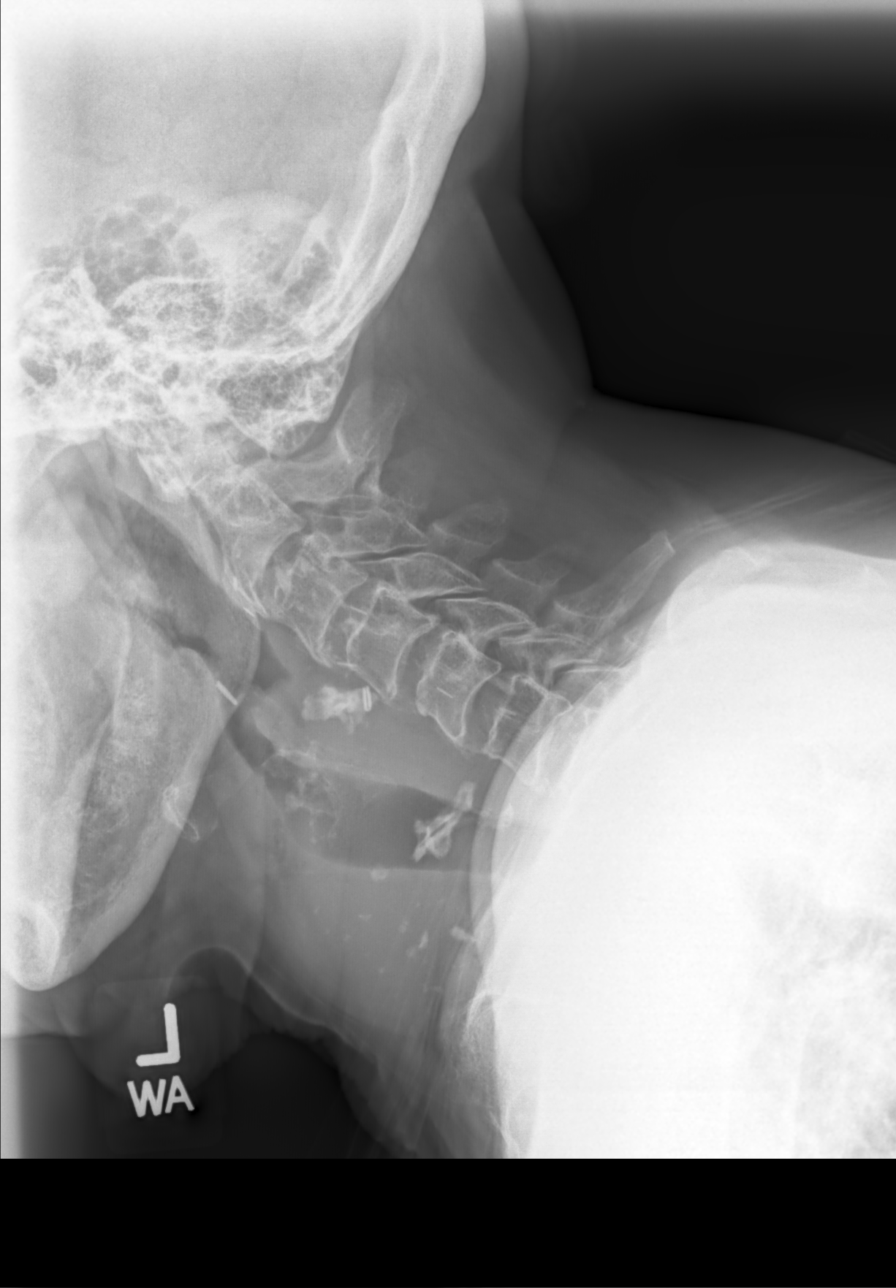

[cervical spine ap]
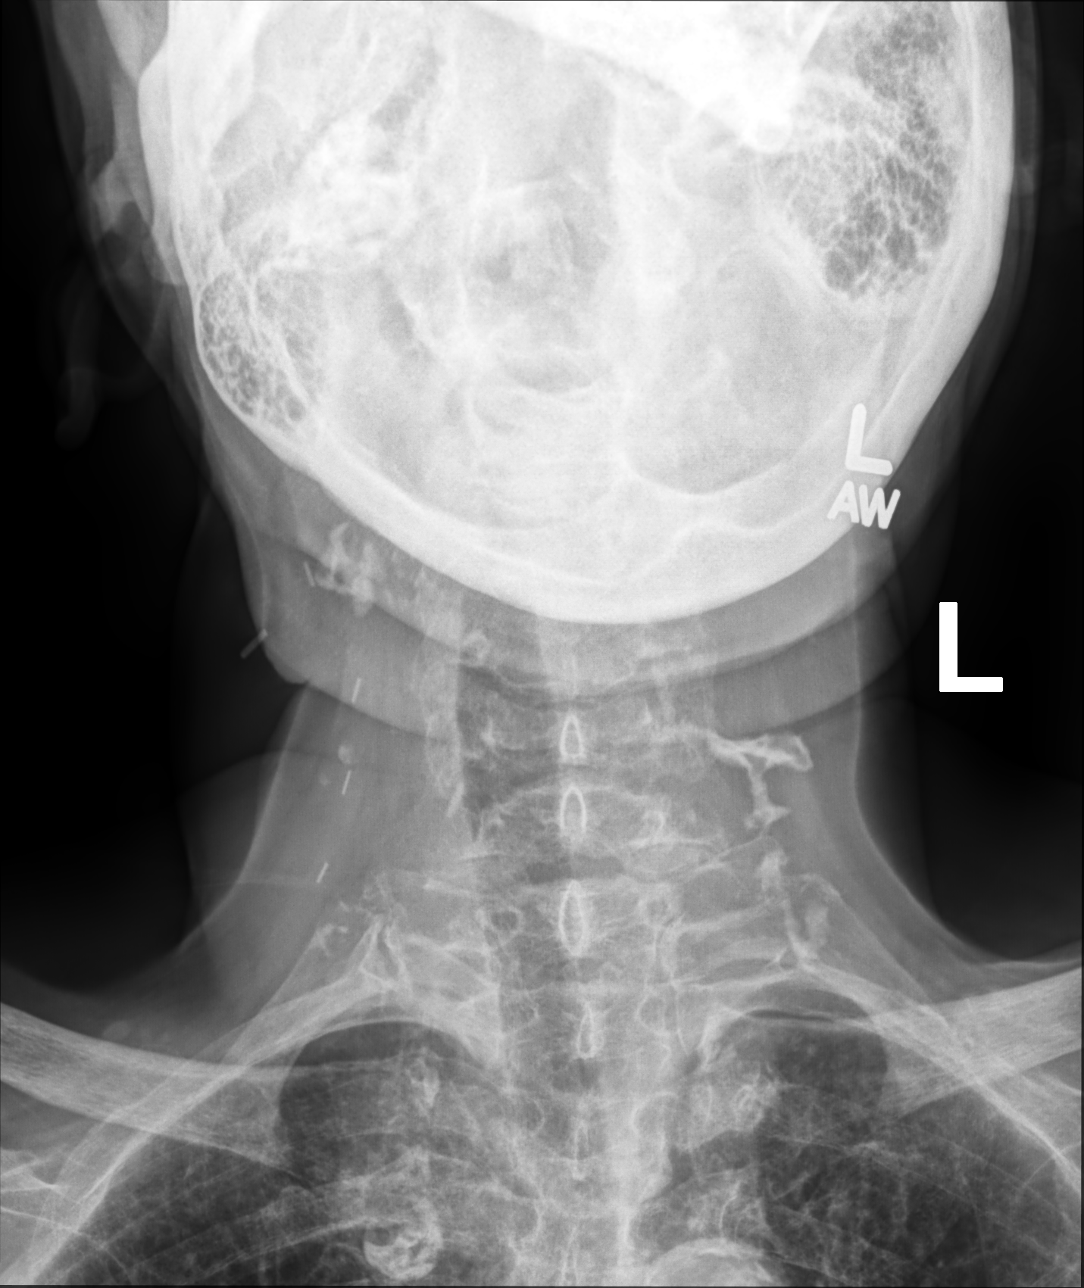

[4 of 4 positions shown; findings below may reference images not displayed]

FINDINGS: C1 through C6 vertebral bodies are visualized. C7 and C7-T1 disc
space are obscured by overlying soft tissues.

Visualized cervical spine appears intact and normally aligned. Disc
spaces appear well maintained in height. No evidence of advanced
degenerative disc disease at any level.

Prominent carotid atherosclerosis calcifications bilaterally.
IMPRESSION: 1. No acute findings.
2. No significant degenerative change seen.
3. Prominent carotid atherosclerosis bilaterally.
4. C7 and C7-T1 disc space are obscured by overlying soft tissues.

## 2021-09-08 ENCOUNTER — Ambulatory Visit: Payer: Medicare Other | Admitting: Podiatry

## 2021-10-31 ENCOUNTER — Emergency Department (HOSPITAL_COMMUNITY): Payer: Medicare Other

## 2021-10-31 ENCOUNTER — Inpatient Hospital Stay (HOSPITAL_COMMUNITY)
Admission: EM | Admit: 2021-10-31 | Discharge: 2021-11-21 | DRG: 438 | Disposition: A | Discharge status: Home Health | Payer: Medicare Other | Attending: Internal Medicine | Admitting: Internal Medicine

## 2021-10-31 ENCOUNTER — Encounter (HOSPITAL_COMMUNITY): Payer: Self-pay

## 2021-10-31 ENCOUNTER — Other Ambulatory Visit: Payer: Self-pay

## 2021-10-31 DIAGNOSIS — N281 Cyst of kidney, acquired: Secondary | ICD-10-CM | POA: Diagnosis present

## 2021-10-31 DIAGNOSIS — Z79899 Other long term (current) drug therapy: Secondary | ICD-10-CM

## 2021-10-31 DIAGNOSIS — K219 Gastro-esophageal reflux disease without esophagitis: Secondary | ICD-10-CM | POA: Diagnosis present

## 2021-10-31 DIAGNOSIS — K2990 Gastroduodenitis, unspecified, without bleeding: Secondary | ICD-10-CM | POA: Diagnosis present

## 2021-10-31 DIAGNOSIS — K279 Peptic ulcer, site unspecified, unspecified as acute or chronic, without hemorrhage or perforation: Secondary | ICD-10-CM | POA: Diagnosis present

## 2021-10-31 DIAGNOSIS — A419 Sepsis, unspecified organism: Secondary | ICD-10-CM | POA: Diagnosis not present

## 2021-10-31 DIAGNOSIS — D696 Thrombocytopenia, unspecified: Secondary | ICD-10-CM | POA: Diagnosis present

## 2021-10-31 DIAGNOSIS — K859 Acute pancreatitis without necrosis or infection, unspecified: Secondary | ICD-10-CM | POA: Diagnosis not present

## 2021-10-31 DIAGNOSIS — Z20822 Contact with and (suspected) exposure to covid-19: Secondary | ICD-10-CM | POA: Diagnosis present

## 2021-10-31 DIAGNOSIS — J9601 Acute respiratory failure with hypoxia: Secondary | ICD-10-CM | POA: Diagnosis not present

## 2021-10-31 DIAGNOSIS — C50919 Malignant neoplasm of unspecified site of unspecified female breast: Secondary | ICD-10-CM | POA: Diagnosis present

## 2021-10-31 DIAGNOSIS — I10 Essential (primary) hypertension: Secondary | ICD-10-CM | POA: Diagnosis present

## 2021-10-31 DIAGNOSIS — N179 Acute kidney failure, unspecified: Secondary | ICD-10-CM | POA: Diagnosis not present

## 2021-10-31 DIAGNOSIS — K2970 Gastritis, unspecified, without bleeding: Secondary | ICD-10-CM

## 2021-10-31 DIAGNOSIS — Z853 Personal history of malignant neoplasm of breast: Secondary | ICD-10-CM

## 2021-10-31 DIAGNOSIS — Z66 Do not resuscitate: Secondary | ICD-10-CM | POA: Diagnosis present

## 2021-10-31 DIAGNOSIS — K862 Cyst of pancreas: Secondary | ICD-10-CM | POA: Diagnosis present

## 2021-10-31 DIAGNOSIS — R0902 Hypoxemia: Secondary | ICD-10-CM

## 2021-10-31 DIAGNOSIS — R638 Other symptoms and signs concerning food and fluid intake: Secondary | ICD-10-CM

## 2021-10-31 DIAGNOSIS — N289 Disorder of kidney and ureter, unspecified: Secondary | ICD-10-CM

## 2021-10-31 DIAGNOSIS — E785 Hyperlipidemia, unspecified: Secondary | ICD-10-CM | POA: Diagnosis present

## 2021-10-31 DIAGNOSIS — R1084 Generalized abdominal pain: Secondary | ICD-10-CM

## 2021-10-31 DIAGNOSIS — Z90710 Acquired absence of both cervix and uterus: Secondary | ICD-10-CM

## 2021-10-31 DIAGNOSIS — Z4659 Encounter for fitting and adjustment of other gastrointestinal appliance and device: Secondary | ICD-10-CM

## 2021-10-31 DIAGNOSIS — E871 Hypo-osmolality and hyponatremia: Secondary | ICD-10-CM | POA: Diagnosis not present

## 2021-10-31 DIAGNOSIS — R64 Cachexia: Secondary | ICD-10-CM | POA: Diagnosis present

## 2021-10-31 DIAGNOSIS — I739 Peripheral vascular disease, unspecified: Secondary | ICD-10-CM | POA: Diagnosis present

## 2021-10-31 DIAGNOSIS — Z515 Encounter for palliative care: Secondary | ICD-10-CM

## 2021-10-31 DIAGNOSIS — R109 Unspecified abdominal pain: Secondary | ICD-10-CM | POA: Diagnosis present

## 2021-10-31 DIAGNOSIS — Z6829 Body mass index (BMI) 29.0-29.9, adult: Secondary | ICD-10-CM

## 2021-10-31 DIAGNOSIS — M170 Bilateral primary osteoarthritis of knee: Secondary | ICD-10-CM | POA: Diagnosis present

## 2021-10-31 DIAGNOSIS — E43 Unspecified severe protein-calorie malnutrition: Secondary | ICD-10-CM | POA: Diagnosis present

## 2021-10-31 DIAGNOSIS — N2889 Other specified disorders of kidney and ureter: Secondary | ICD-10-CM

## 2021-10-31 DIAGNOSIS — I2510 Atherosclerotic heart disease of native coronary artery without angina pectoris: Secondary | ICD-10-CM | POA: Diagnosis present

## 2021-10-31 DIAGNOSIS — F17210 Nicotine dependence, cigarettes, uncomplicated: Secondary | ICD-10-CM | POA: Diagnosis present

## 2021-10-31 DIAGNOSIS — Z7982 Long term (current) use of aspirin: Secondary | ICD-10-CM

## 2021-10-31 DIAGNOSIS — D649 Anemia, unspecified: Secondary | ICD-10-CM | POA: Diagnosis not present

## 2021-10-31 DIAGNOSIS — R1010 Upper abdominal pain, unspecified: Secondary | ICD-10-CM

## 2021-10-31 DIAGNOSIS — Z720 Tobacco use: Secondary | ICD-10-CM | POA: Diagnosis present

## 2021-10-31 DIAGNOSIS — K5900 Constipation, unspecified: Secondary | ICD-10-CM | POA: Diagnosis not present

## 2021-10-31 DIAGNOSIS — K861 Other chronic pancreatitis: Secondary | ICD-10-CM | POA: Diagnosis present

## 2021-10-31 DIAGNOSIS — Z9049 Acquired absence of other specified parts of digestive tract: Secondary | ICD-10-CM

## 2021-10-31 DIAGNOSIS — E440 Moderate protein-calorie malnutrition: Secondary | ICD-10-CM | POA: Insufficient documentation

## 2021-10-31 DIAGNOSIS — J690 Pneumonitis due to inhalation of food and vomit: Secondary | ICD-10-CM | POA: Diagnosis not present

## 2021-10-31 LAB — CBC WITH DIFFERENTIAL/PLATELET
Abs Immature Granulocytes: 0.02 10*3/uL (ref 0.00–0.07)
Basophils Absolute: 0.1 10*3/uL (ref 0.0–0.1)
Basophils Relative: 1 %
Eosinophils Absolute: 0.1 10*3/uL (ref 0.0–0.5)
Eosinophils Relative: 1 %
HCT: 42.4 % (ref 36.0–46.0)
Hemoglobin: 13.3 g/dL (ref 12.0–15.0)
Immature Granulocytes: 0 %
Lymphocytes Relative: 24 %
Lymphs Abs: 1.8 10*3/uL (ref 0.7–4.0)
MCH: 26.7 pg (ref 26.0–34.0)
MCHC: 31.4 g/dL (ref 30.0–36.0)
MCV: 85 fL (ref 80.0–100.0)
Monocytes Absolute: 0.6 10*3/uL (ref 0.1–1.0)
Monocytes Relative: 8 %
Neutro Abs: 4.9 10*3/uL (ref 1.7–7.7)
Neutrophils Relative %: 66 %
Platelets: 134 10*3/uL — ABNORMAL LOW (ref 150–400)
RBC: 4.99 MIL/uL (ref 3.87–5.11)
RDW: 15.2 % (ref 11.5–15.5)
WBC: 7.3 10*3/uL (ref 4.0–10.5)
nRBC: 0 % (ref 0.0–0.2)

## 2021-10-31 LAB — RESP PANEL BY RT-PCR (FLU A&B, COVID) ARPGX2
Influenza A by PCR: NEGATIVE
Influenza B by PCR: NEGATIVE
SARS Coronavirus 2 by RT PCR: NEGATIVE

## 2021-10-31 LAB — COMPREHENSIVE METABOLIC PANEL
ALT: 11 U/L (ref 0–44)
AST: 22 U/L (ref 15–41)
Albumin: 3.2 g/dL — ABNORMAL LOW (ref 3.5–5.0)
Alkaline Phosphatase: 76 U/L (ref 38–126)
Anion gap: 7 (ref 5–15)
BUN: 47 mg/dL — ABNORMAL HIGH (ref 8–23)
CO2: 20 mmol/L — ABNORMAL LOW (ref 22–32)
Calcium: 9.6 mg/dL (ref 8.9–10.3)
Chloride: 114 mmol/L — ABNORMAL HIGH (ref 98–111)
Creatinine, Ser: 1.74 mg/dL — ABNORMAL HIGH (ref 0.44–1.00)
GFR, Estimated: 29 mL/min — ABNORMAL LOW (ref >60)
Glucose, Bld: 83 mg/dL (ref 70–99)
Potassium: 4.5 mmol/L (ref 3.5–5.1)
Sodium: 141 mmol/L (ref 135–145)
Total Bilirubin: 0.6 mg/dL (ref 0.3–1.2)
Total Protein: 6.8 g/dL (ref 6.5–8.1)

## 2021-10-31 LAB — POC OCCULT BLOOD, ED: Fecal Occult Bld: NEGATIVE

## 2021-10-31 LAB — LIPASE, BLOOD: Lipase: 378 U/L — ABNORMAL HIGH (ref 11–51)

## 2021-10-31 MED ORDER — NICOTINE 14 MG/24HR TD PT24
14.0000 mg | MEDICATED_PATCH | TRANSDERMAL | Status: DC
  Administered 2021-11-01 – 2021-11-20 (×20): 14 mg via TRANSDERMAL
  Filled 2021-10-31 (×21): qty 1

## 2021-10-31 MED ORDER — ACETAMINOPHEN 650 MG RE SUPP: 650.0000 mg | Freq: Four times a day (QID) | RECTAL | Status: DC | PRN

## 2021-10-31 MED ORDER — SUCRALFATE 1 G PO TABS
1.0000 g | ORAL_TABLET | Freq: Three times a day (TID) | ORAL | Status: DC
Start: 2021-10-31 — End: 2021-11-08
  Administered 2021-10-31 – 2021-11-05 (×17): 1 g via ORAL
  Filled 2021-10-31 (×33): qty 1

## 2021-10-31 MED ORDER — ATORVASTATIN CALCIUM 40 MG PO TABS
40.0000 mg | ORAL_TABLET | Freq: Every evening | ORAL | Status: DC
  Administered 2021-10-31 – 2021-11-05 (×6): 40 mg via ORAL
  Filled 2021-10-31 (×8): qty 1

## 2021-10-31 MED ORDER — OXYCODONE HCL 5 MG PO TABS
5.0000 mg | ORAL_TABLET | ORAL | Status: DC | PRN
  Administered 2021-11-08: 5 mg via ORAL
  Filled 2021-10-31: qty 1

## 2021-10-31 MED ORDER — HYDRALAZINE HCL 50 MG PO TABS
50.0000 mg | ORAL_TABLET | Freq: Three times a day (TID) | ORAL | Status: DC
  Administered 2021-10-31 – 2021-11-04 (×11): 50 mg via ORAL
  Filled 2021-10-31 (×10): qty 1
  Filled 2021-10-31: qty 2
  Filled 2021-10-31: qty 1

## 2021-10-31 MED ORDER — SODIUM CHLORIDE 0.9 % IV BOLUS
500.0000 mL | Freq: Once | INTRAVENOUS | Status: AC
  Administered 2021-10-31: 500 mL via INTRAVENOUS

## 2021-10-31 MED ORDER — BISACODYL 5 MG PO TBEC: 5.0000 mg | DELAYED_RELEASE_TABLET | Freq: Every day | ORAL | Status: DC | PRN

## 2021-10-31 MED ORDER — VERAPAMIL HCL ER 240 MG PO TBCR
240.0000 mg | EXTENDED_RELEASE_TABLET | Freq: Every morning | ORAL | Status: DC
  Administered 2021-11-01 – 2021-11-06 (×5): 240 mg via ORAL
  Filled 2021-10-31 (×8): qty 1

## 2021-10-31 MED ORDER — ONDANSETRON HCL 4 MG/2ML IJ SOLN
4.0000 mg | Freq: Four times a day (QID) | INTRAMUSCULAR | Status: DC | PRN
  Administered 2021-10-31: 4 mg via INTRAVENOUS
  Filled 2021-10-31: qty 2

## 2021-10-31 MED ORDER — ASPIRIN EC 81 MG PO TBEC
81.0000 mg | DELAYED_RELEASE_TABLET | Freq: Every day | ORAL | Status: DC
  Administered 2021-11-01 – 2021-11-05 (×5): 81 mg via ORAL
  Filled 2021-10-31 (×7): qty 1

## 2021-10-31 MED ORDER — LACTATED RINGERS IV SOLN: INTRAVENOUS | Status: DC

## 2021-10-31 MED ORDER — PANTOPRAZOLE SODIUM 40 MG IV SOLR
40.0000 mg | INTRAVENOUS | Status: DC
  Administered 2021-10-31 – 2021-11-02 (×3): 40 mg via INTRAVENOUS
  Filled 2021-10-31 (×3): qty 40

## 2021-10-31 MED ORDER — ACETAMINOPHEN 325 MG PO TABS
650.0000 mg | ORAL_TABLET | Freq: Four times a day (QID) | ORAL | Status: DC | PRN
  Administered 2021-11-03: 09:00:00 650 mg via ORAL
  Filled 2021-10-31: qty 2

## 2021-10-31 MED ORDER — ENOXAPARIN SODIUM 30 MG/0.3ML IJ SOSY
30.0000 mg | PREFILLED_SYRINGE | INTRAMUSCULAR | Status: DC
  Administered 2021-10-31 – 2021-11-03 (×4): 30 mg via SUBCUTANEOUS
  Filled 2021-10-31 (×5): qty 0.3

## 2021-10-31 MED ORDER — HYDRALAZINE HCL 20 MG/ML IJ SOLN
10.0000 mg | Freq: Once | INTRAMUSCULAR | Status: AC
  Administered 2021-10-31: 10 mg via INTRAVENOUS
  Filled 2021-10-31: qty 1

## 2021-10-31 MED ORDER — POLYETHYLENE GLYCOL 3350 17 G PO PACK: 17.0000 g | PACK | Freq: Every day | ORAL | Status: DC | PRN

## 2021-10-31 MED ORDER — ALBUTEROL SULFATE (2.5 MG/3ML) 0.083% IN NEBU
3.0000 mL | INHALATION_SOLUTION | Freq: Four times a day (QID) | RESPIRATORY_TRACT | Status: DC | PRN
  Administered 2021-11-16: 3 mL via RESPIRATORY_TRACT
  Filled 2021-10-31: qty 3

## 2021-10-31 MED ORDER — MIRTAZAPINE 15 MG PO TABS
15.0000 mg | ORAL_TABLET | Freq: Every day | ORAL | Status: DC
  Administered 2021-10-31 – 2021-11-05 (×4): 15 mg via ORAL
  Filled 2021-10-31 (×5): qty 1

## 2021-10-31 MED ORDER — ONDANSETRON HCL 4 MG PO TABS: 4.0000 mg | ORAL_TABLET | Freq: Four times a day (QID) | ORAL | Status: DC | PRN

## 2021-10-31 MED ORDER — GABAPENTIN 300 MG PO CAPS
300.0000 mg | ORAL_CAPSULE | Freq: Every day | ORAL | Status: DC
  Administered 2021-10-31 – 2021-11-05 (×4): 300 mg via ORAL
  Filled 2021-10-31 (×5): qty 1

## 2021-10-31 MED ORDER — FENTANYL CITRATE PF 50 MCG/ML IJ SOSY
12.5000 ug | PREFILLED_SYRINGE | INTRAMUSCULAR | Status: DC | PRN
  Administered 2021-10-31 – 2021-11-04 (×3): 12.5 ug via INTRAVENOUS
  Filled 2021-10-31 (×3): qty 1

## 2021-10-31 MED ORDER — DOCUSATE SODIUM 100 MG PO CAPS
100.0000 mg | ORAL_CAPSULE | Freq: Two times a day (BID) | ORAL | Status: DC
  Administered 2021-11-01 – 2021-11-02 (×3): 100 mg via ORAL
  Filled 2021-10-31 (×3): qty 1

## 2021-10-31 MED ORDER — HYDRALAZINE HCL 20 MG/ML IJ SOLN
5.0000 mg | INTRAMUSCULAR | Status: DC | PRN
  Administered 2021-11-03 – 2021-11-18 (×3): 5 mg via INTRAVENOUS
  Filled 2021-10-31 (×3): qty 1

## 2021-10-31 NOTE — ED Notes (Signed)
Pt ambulated to restroom with daughter.

## 2021-10-31 NOTE — Assessment & Plan Note (Signed)
-  Cotinue Verapamil, hydralazine -Hold Cozaar -Will add prn IV hydralazine

## 2021-10-31 NOTE — ED Notes (Signed)
Notified night provider about pt BP.  BP on left arm was reading 73/59 (65) and 65/52 (58).  I rechecked on right 129/64 (81). Heart Rate 108 Temp 98.4

## 2021-10-31 NOTE — H&P (Signed)
History and Physical    Linda [name] CBJ:[id number] DOB: 14-Mar-1936 DOA: 10/31/2021  PCP: Sonia Side., FNP Consultants:  Elisha Ponder - podiatry; Marlou Porch - cardiology; Robley Fries - nephrology Patient coming from:  Home - lives with daughter; [facility]: Daughter, April Schramm, [phone]  Chief Complaint: Abdominal pain  HPI: Linda [name] is a 85 y.o. female with medical history significant of remote breast cancer; HTN; HLD; and PVD presenting with abdominal pain.  She has been hurting for a couple of days and wasn't able to eat.  She was hurting along the suprapubic area.  She had a bitter taste in her mouth and when she eats it hurts her stomach.  When the food arrives in her stomach, it felt like it was burning "like a heartburn in your stomach."  She has had that before and was diagnosed with pancreatitis.  She has never had an EGD. She takes Aleve for pain, maybe a couple of times a month and she can't remember when she last took it.  It has been at least 2 weeks since she vomited.  Symptoms have been going on for at least 3 weeks, maybe longer.  Last BM was yesterday and a little loose but "almost" normal.  She remembers Dr. Royce Macadamia talking to her about a renal mass in the past.  Initially found on CT in 06/2019.  Renal US ordered by Dr. Royce Macadamia in 07/2020 did not demonstrate the mass.  It is now present and larger on CT.  She was seen by her PCP maybe a month ago for this abdominal pain and was diagnosed with "an infection" and given 2 medications - an antibiotic and another that she was supposed to take 4 times a day (H pylori treatment?).  She dropped and spilled the one.  She also couldn't take the antibiotic as prescribed since she wasn't eating well and they knew she would get sicker if she took the antibiotic on an empty stomach.    ED Course: Pancreatitis, decreased oral intake, AKI vs. CKD.  3 weeks of abdominal pain.  Has renal mass, ?RCC.    Review of Systems: As per HPI;  otherwise review of systems reviewed and negative.   Ambulatory Status:  Ambulates with a cane  COVID Vaccine Status:   Complete plus boosters  Past Medical History:  Diagnosis Date   Arthritis    Carotid artery disease ([place]) 12/18/2013   CONSTIPATION, CHRONIC 04/23/2008   Qualifier: Diagnosis of  By: Harlon Ditty CMA (AAMA), Dottie     Diverticulosis of colon (without mention of hemorrhage)    Essential hypertension 04/23/2008   Qualifier: Diagnosis of  By: Nelson-Smith CMA (AAMA), Dottie     GERD (gastroesophageal reflux disease)    HLD (hyperlipidemia) 08/15/2019   Malignant neoplasm of female breast ([place]) 04/23/2008   Qualifier: History of  By: Harlon Ditty CMA (AAMA), Dottie     Nail fungus    Neck pain 11/07/2019   Pancreatitis, acute 09/28/2014   Peripheral vascular disease ([place]) 02/24/2012   Post herpetic neuralgia 11/22/2018   Primary osteoarthritis of both knees 11/07/2019   Tobacco abuse 09/28/2014   Uterine fibroid     Past Surgical History:  Procedure Laterality Date   ABDOMINAL HYSTERECTOMY     BREAST LUMPECTOMY Right 2004   radiation and chemo   CHOLECYSTECTOMY  ~1980   COLONOSCOPY     Hx; of   ENDARTERECTOMY Right 01/19/2014   Procedure: ENDARTERECTOMY CAROTID;  Surgeon: Serafina Mitchell, MD;  Location: [facility] OR;  Service: Vascular;  Laterality: Right;   Right lumpectomy with right sentinel lymph node dissection  2004   Dr. Bubba Camp   VAGINAL HYSTERECTOMY      Social History   Socioeconomic History   Marital status: Single    Spouse name: Not on file   Number of children: 2   Years of education: Not on file   Highest education level: Not on file  Occupational History   Occupation: Retired  Tobacco Use   Smoking status: Every Day    Packs/day: 1.00    Years: 58.00    Pack years: 58.00    Types: Cigarettes   Smokeless tobacco: Never   Tobacco comments:    form given 12/14/11  Substance and Sexual Activity   Alcohol use: No    Alcohol/week: 0.0  standard drinks   Drug use: No   Sexual activity: Never  Other Topics Concern   Not on file  Social History Narrative   Not on file   Social Determinants of Health   Financial Resource Strain: Not on file  Food Insecurity: Not on file  Transportation Needs: Not on file  Physical Activity: Not on file  Stress: Not on file  Social Connections: Not on file  Intimate Partner Violence: Not on file    No Known Allergies  Family History  Problem Relation Age of Onset   Colon cancer Mother    Cancer Mother        Kidney   Pneumonia Father 42       Double   Colon cancer Maternal Grandmother     Prior to Admission medications   Medication Sig Start Date End Date Taking? Authorizing Provider  albuterol (VENTOLIN HFA) 108 (90 Base) MCG/ACT inhaler Inhale 1 puff into the lungs every 6 (six) hours as needed. 10/27/21  Yes [provider]  aspirin EC 81 MG tablet Take 1 tablet (81 mg total) by mouth daily. 02/24/12  Yes [name], MD  atorvastatin (LIPITOR) 40 MG tablet TAKE 1 TABLET BY MOUTH EACH EVENING Patient taking differently: Take 40 mg by mouth every evening. 04/04/20  Yes Luetta Nutting, DO  dexlansoprazole (DEXILANT) 60 MG capsule Take 1qd (Plz sched appt with new provider) Patient taking differently: Take 60 mg by mouth daily. 03/11/20  Yes Libby Maw, MD  gabapentin (NEURONTIN) 300 MG capsule TAKE 1 CAPSULE BY MOUTH EVERY DAY. No further refills. Schedule with Dustin Folks for follow up. Patient taking differently: Take 300 mg by mouth at bedtime. TAKE 1 CAPSULE BY MOUTH EVERY DAY. No further refills. Schedule with Dustin Folks for follow up. 06/18/20  Yes Libby Maw, MD  hydrALAZINE (APRESOLINE) 50 MG tablet Take 50 mg by mouth 3 (three) times daily. 12/25/20  Yes [provider]  losartan (COZAAR) 25 MG tablet Take 25 mg by mouth daily. 10/08/20  Yes [provider]  mirtazapine (REMERON) 15 MG tablet Take 15 mg by mouth at  bedtime. 09/22/21  Yes [provider]  ondansetron (ZOFRAN) 4 MG tablet Take 4 mg by mouth every 8 (eight) hours as needed. 09/22/21  Yes [provider]  verapamil (CALAN-SR) 120 MG CR tablet Take 240 mg by mouth in the morning. 09/16/21  Yes [provider]  hydrALAZINE (APRESOLINE) 25 MG tablet TAKE 1 TABLET BY MOUTH FOUR TIMES A DAY Patient not taking: Reported on 10/31/2021 07/17/19   Luetta Nutting, DO  Vitamin D, Ergocalciferol, (DRISDOL) 1.25 MG (50000 UNIT) CAPS capsule Take 50,000  Units by mouth once a week. Patient not taking: Reported on 10/31/2021 07/23/20   [provider]  omeprazole (PRILOSEC) 20 MG capsule Take 1 capsule (20 mg total) by mouth daily. 12/10/11 02/24/12  Lisbeth Renshaw, PA-C    Physical Exam: Vitals:   10/31/21 1330 10/31/21 1400 10/31/21 1430 10/31/21 1630  BP: (!) 142/86 (!) 195/86 (!) 168/76 138/72  Pulse: (!) 101 87 88   Resp: 17 (!) 22 (!) 21   Temp:      SpO2: 100% 100% 100%   Weight:      Height:         General:  Appears calm and comfortable and is in NAD Eyes:   EOMI, normal lids, iris ENT:  grossly normal hearing, lips & tongue, mmm; edentulous Neck:  no LAD, masses or thyromegaly Cardiovascular:  RRR, no m/r/g. No LE edema.  Respiratory:   CTA bilaterally with no wheezes/rales/rhonchi.  Normal respiratory effort. Abdomen:  soft, NT, ND Skin:  no rash or induration seen on limited exam Musculoskeletal:  grossly normal tone BUE/BLE, good ROM, no bony abnormality Psychiatric:  grossly normal mood and affect, speech fluent and appropriate, AOx3 Neurologic:  CN 2-12 grossly intact, moves all extremities in coordinated fashion   Radiological Exams on Admission: Independently reviewed - see discussion in A/P where applicable  CT ABDOMEN PELVIS WO CONTRAST  Result Date: 10/31/2021 CLINICAL DATA:  85 year old female with abdominal pain. EXAM: CT ABDOMEN AND PELVIS WITHOUT CONTRAST TECHNIQUE: Multidetector  CT imaging of the abdomen and pelvis was performed following the standard protocol without IV contrast. COMPARISON:  06/15/2019 FINDINGS: Lower chest: No acute abnormality. Hepatobiliary: No focal liver abnormality is seen. Status post cholecystectomy. No biliary dilatation. Pancreas: Similar appearing tubular cystic lesion about the pancreatic tail measuring up to approximately 1.3 cm, similar to comparison. No new pancreatic ductal dilation or focal mass. Spleen: Normal in size without focal abnormality. Adrenals/Urinary Tract: Similar appearing mild left adrenal gland thickening without discrete nodularity. Hip interval enlargement of previously described exophytic left solid renal mass arising from the posterior upper pole which measures up to 2.5 cm, previously 1.9 cm. Similar appearance of scattered simple cysts about the kidneys bilaterally. No hydronephrosis. Bladder is unremarkable. Stomach/Bowel: Stomach is within normal limits. Appendix appears normal. Pan colonic diverticula without surrounding inflammatory changes. No evidence of bowel wall thickening, distention, or inflammatory changes. Vascular/Lymphatic: Aortic atherosclerosis. No enlarged abdominal or pelvic lymph nodes. Reproductive: Status post hysterectomy. No adnexal masses. Other: Similar appearing small fat containing umbilical hernia. No abdominopelvic ascites. Musculoskeletal: No acute osseous abnormality. Similar appearing multilevel degenerative changes of the visualized thoracolumbar spine. IMPRESSION: 1. No acute abdominopelvic abnormality. 2. Interval enlargement of previously described left upper pole exophytic solid mass from 1.9 cm to 2.5 cm since 2020. This remains suspicious for primary renal neoplasm. Further evaluation with contrast-enhanced ultrasound would be reasonable given intolerance to intravenous contrast agents. 3. Diverticulosis without evidence of diverticulitis. 4. Indeterminate similar appearing slightly decreased  size of ovoid cystic lesion in the pancreatic tail, statistically favored represent a benign IPMN. Electronically Signed   By: Ruthann Cancer M.D.   On: 10/31/2021 14:34    EKG: not done   Labs on Admission: I have personally reviewed the available labs and imaging studies at the time of the admission.  Pertinent labs:   BUN 47/Creatinine 1.74/GFR 29 - stage 3a CKD in 2020 Albumin 3.2 Unremarkable CBC COVID/flu negative Heme negative   Assessment/Plan Principal Problem:   Abdominal pain Active Problems:  AKI (acute kidney injury) ([place])   Malignant neoplasm of female breast ([place])   Essential hypertension   Tobacco abuse   HLD (hyperlipidemia)   Renal mass, left    * Abdominal pain -Patient with report of prior "pancreatitis" presenting with vague abdominal pain (points to RLQ/spurapubic region) and poor PO intake with burning in her stomach when she eats -Based on prior description, she appears to have been recently diagnosed with H pylori and did not complete therapy -Suspect esophageal/gastric ulcer -Mildly elevated lipase -Normal abdominal CT -Low suspicion for acte pancreatitis, possibly chronic -Will observe on med surg -GI consult -IV Protonix daily -Will add Carafate  AKI (acute kidney injury) ([place]) -Uncertain baseline - last renal function appears to be stage 3a CKD in 2020 -Likely some component of AKI given poor PO intake -Will hydrate and follow -Avoid nephrotoxic medications including Cozaar  Renal mass, left -Patient had mass on CT in 2020 but not noted on [place] in 2021 -Mass has now enlarged -Contrast-enhanced [place] ordered as per radiology recommendation, but this is requested to be performed as an outpatient according to rad tech/Dr. Serafina Royals -Will need outpatient f/u  HLD (hyperlipidemia) -Continue Lipitor  Tobacco abuse -Encourage cessation.   -This was discussed with the patient and should be reviewed on an ongoing basis.   -Patch ordered at patient  request.  Essential hypertension -Cotinue Verapamil, hydralazine -Hold Cozaar -Will add prn IV hydralazine  Malignant neoplasm of female breast ([place]) -Remote, no therapy, no current concerns    Note: This patient has been tested and is negative for the novel coronavirus COVID-19. The patient has been fully vaccinated against COVID-19.   Level of care: Med-Surg DVT prophylaxis:  Lovenox  Code Status:  DNR - confirmed with patient/family Family Communication: Daughter was present throughout evaluation Disposition Plan:  The patient is from: home  Anticipated d/c is to: home without [facility] services  Anticipated d/c date will depend on clinical response to treatment, but possibly as early as tomorrow if she has excellent response to treatment  Patient is currently: acutely ill Consults called: GI  Admission status:  It is my clinical opinion that referral for OBSERVATION is reasonable and necessary in this patient based on the above information provided. The aforementioned taken together are felt to place the patient at high risk for further clinical deterioration. However it is anticipated that the patient may be medically stable for discharge from the hospital within 24 to 48 hours.    Karmen Bongo MD [facility]   How to contact the [facility] Attending or Consulting provider [place] or covering provider during after hours [place], for this patient?  Check the care team in [facility] and look for a) attending/consulting [facility] provider listed and b) the [facility] team listed Log into [email or web address] and use [place]'s universal password to access. If you do not have the password, please contact the hospital operator. Locate the [facility] provider you are looking for under [facility] and page to a number that you can be directly reached. If you still have difficulty reaching the provider, please page the [facility] (Director on Call) for the Hospitalists listed on amion for assistance.   10/31/2021,  4:39 PM

## 2021-10-31 NOTE — ED Triage Notes (Signed)
Pt reports 3 weeks of abd pain, was seen 2 weeks ago, told she had bacteria in her gut and was given abx, pt reports diarrhea since Wednesday. No n/v. Pt a.o

## 2021-10-31 NOTE — ED Provider Notes (Signed)
[facility] EMERGENCY DEPARTMENT Provider Note   CSN: [id number] Arrival date & time: 10/31/21  9798     History Chief Complaint  Patient presents with   Abdominal Pain    Linda [name] is a 85 y.o. female with a past medical history of hypertension, hyperlipidemia, breast cancer in remission, CAD presenting today with complaint of abdominal pain.  She reports this has been going on for the past 3 weeks.  3 weeks ago she was seen at an outside facility and told that they believe she has "bacteria in her stomach."  She was started on antibiotic, potentially Levofloxacin,  however she has not been taking these due to both mask placement and fear of taking them on an empty stomach.  She has had a decrease in her appetite and is not drinking water.  Has been eating ice chips for the past few days.  Family member reports that she has lost between 85 and 20 pounds.  Denies any nausea or vomiting for the past week, had some previous to this.  No constipation or diarrhea however does feel as though she has had softer and darker stools.  She is unable to localize her abdominal pain and reports that it is mostly all over.  She reports that it is not provoked by palpation however is always there.  History of cholecystectomy, still has her appendix.  No other abdominal surgeries.  History of pancreatitis and reports that her son died of pancreatic cancer. No ETOH use.    Past Medical History:  Diagnosis Date   Abdominal pain 06/16/2019   AKI (acute kidney injury) ([place]) 06/15/2019   Arthritis    Breast cancer ([place])    Cardiac murmur 05/16/2018   Carotid artery disease ([place]) 12/18/2013   Constipation    CONSTIPATION, CHRONIC 04/23/2008   Qualifier: Diagnosis of  By: Harlon Ditty CMA (AAMA), Dottie     Diverticulosis of colon (without mention of hemorrhage)    Essential hypertension 04/23/2008   Qualifier: Diagnosis of  By: Nelson-Smith CMA (AAMA), Dottie     GERD (gastroesophageal  reflux disease)    HLD (hyperlipidemia) 08/15/2019   Hypertension    Lower abdominal pain 06/14/2019   Malignant neoplasm of female breast ([place]) 04/23/2008   Qualifier: History of  By: Harlon Ditty CMA (AAMA), Dottie     Nail fungus    Neck pain 11/07/2019   Pancreatitis, acute 09/28/2014   Peripheral vascular disease ([place]) 02/24/2012   Post herpetic neuralgia 11/22/2018   Primary osteoarthritis of both knees 11/07/2019   Tobacco abuse 09/28/2014   Uterine fibroid     Patient Active Problem List   Diagnosis Date Noted   Neck pain 11/07/2019   Primary osteoarthritis of both knees 11/07/2019   HLD (hyperlipidemia) 08/15/2019   Abdominal pain 06/16/2019   AKI (acute kidney injury) ([place]) 06/15/2019   Lower abdominal pain 06/14/2019   Post herpetic neuralgia 11/22/2018   Cardiac murmur 05/16/2018   GERD (gastroesophageal reflux disease) 05/16/2018   Tobacco abuse 09/28/2014   Pancreatitis, acute 09/28/2014   Carotid artery disease ([place]) 12/18/2013   Peripheral vascular disease ([place]) 02/24/2012   Malignant neoplasm of female breast ([place]) 04/23/2008   Essential hypertension 04/23/2008   DIVERTICULOSIS OF COLON 04/23/2008   CONSTIPATION, CHRONIC 04/23/2008    Past Surgical History:  Procedure Laterality Date   ABDOMINAL HYSTERECTOMY     BREAST LUMPECTOMY Right 2004   radiation and chemo   CHOLECYSTECTOMY  ~1980   COLONOSCOPY  Hx; of   ENDARTERECTOMY Right 01/19/2014   Procedure: ENDARTERECTOMY CAROTID;  Surgeon: Serafina Mitchell, MD;  Location: [facility] OR;  Service: Vascular;  Laterality: Right;   Right lumpectomy with right sentinel lymph node dissection  2004   Dr. Bubba Camp   VAGINAL HYSTERECTOMY       OB History   No obstetric history on file.     Family History  Problem Relation Age of Onset   Colon cancer Mother    Cancer Mother        Kidney   Pneumonia Father 41       Double   Colon cancer Maternal Grandmother     Social History   Tobacco Use   Smoking status:  Every Day    Packs/day: 1.00    Types: Cigarettes   Smokeless tobacco: Never   Tobacco comments:    form given 12/14/11  Substance Use Topics   Alcohol use: No    Alcohol/week: 0.0 standard drinks   Drug use: No    Home Medications Prior to Admission medications   Medication Sig Start Date End Date Taking? Authorizing Provider  aspirin EC 81 MG tablet Take 1 tablet (81 mg total) by mouth daily. 02/24/12   [name], MD  atorvastatin (LIPITOR) 40 MG tablet TAKE 1 TABLET BY MOUTH EACH EVENING 04/04/20   Luetta Nutting, DO  dexlansoprazole [facility]) 60 MG capsule Take 1qd (Plz sched appt with new provider) 03/11/20   Libby Maw, MD  gabapentin (NEURONTIN) 300 MG capsule TAKE 1 CAPSULE BY MOUTH EVERY DAY. No further refills. Schedule with Dustin Folks for follow up. 06/18/20   Libby Maw, MD  hydrALAZINE (APRESOLINE) 25 MG tablet TAKE 1 TABLET BY MOUTH FOUR TIMES A DAY 07/17/19   Luetta Nutting, DO  hydrALAZINE (APRESOLINE) 50 MG tablet Take 50 mg by mouth 3 (three) times daily. 12/25/20   [provider]  losartan (COZAAR) 25 MG tablet Take 12.5 mg by mouth daily. 10/08/20   [provider]  lubiprostone (AMITIZA) 24 MCG capsule TAKE 1 CAPSULE (24 MCG) BY MOUTH 2 TIMES PER DAY WITH FOOD AND WATER FOR CONSTIPATION 06/06/20   [provider]  verapamil (VERELAN PM) 120 MG 24 hr capsule TAKE 2 CAPSULES (240 MG TOTAL) BY MOUTH AT BEDTIME. 02/28/21 05/29/21  Dutch Quint B, FNP  Vitamin D, Ergocalciferol, (DRISDOL) 1.25 MG (50000 UNIT) CAPS capsule Take 50,000 Units by mouth once a week. 07/23/20   [provider]  omeprazole (PRILOSEC) 20 MG capsule Take 1 capsule (20 mg total) by mouth daily. 12/10/11 02/24/12  P[place]ak, Anderson Malta, PA-C    Allergies    Patient has no known allergies.  Review of Systems   Review of Systems  Constitutional:  Positive for fatigue and unexpected weight change.  Gastrointestinal:  Positive for abdominal pain.  Negative for constipation, diarrhea, nausea and vomiting.  Neurological:  Positive for weakness and light-headedness. Negative for syncope.  All other systems reviewed and are negative.  Physical Exam Updated Vital Signs BP (!) 152/110 (BP Location: Left Arm)   Pulse 94   Temp (!) 97.5 F (36.4 C)   Resp 18   Ht 5\' 1"  (1.549 m)   Wt 69.8 kg   SpO2 100%   BMI 29.08 kg/m   Physical Exam Vitals and nursing note reviewed. Exam conducted with a chaperone present.  Constitutional:      General: She is not in acute distress.    Appearance: Normal appearance. She is ill-appearing.  HENT:     Head: Normocephalic and atraumatic.     Mouth/Throat:     Comments: Very dry and pale tongue.  Dry mucous membranes and pharynx Eyes:     General: No scleral icterus.    Conjunctiva/sclera: Conjunctivae normal.  Cardiovascular:     Rate and Rhythm: Normal rate and regular rhythm.     Comments: Systolic murmur Pulmonary:     Effort: Pulmonary effort is normal. No respiratory distress.     Breath sounds: No wheezing.  Abdominal:     General: Abdomen is flat.     Palpations: Abdomen is soft.     Tenderness: There is no abdominal tenderness.     Hernia: No hernia is present.  Genitourinary:    Rectum: External hemorrhoid present. No tenderness or internal hemorrhoid. Normal anal tone.     Skin:    General: Skin is dry.     Findings: No rash.  Neurological:     Mental Status: She is alert.  Psychiatric:        Mood and Affect: Mood normal.    ED Results / Procedures / Treatments   Labs (all labs ordered are listed, but only abnormal results are displayed) Labs Reviewed  CBC WITH DIFFERENTIAL/PLATELET - Abnormal; Notable for the following components:      Result Value   Platelets 134 (*)    All other components within normal limits  COMPREHENSIVE METABOLIC PANEL - Abnormal; Notable for the following components:   Chloride 114 (*)    CO2 20 (*)    BUN 47 (*)    Creatinine, Ser  1.74 (*)    Albumin 3.2 (*)    GFR, Estimated 29 (*)    All other components within normal limits  LIPASE, BLOOD - Abnormal; Notable for the following components:   Lipase 378 (*)    All other components within normal limits  RESP PANEL BY RT-PCR (FLU A&B, COVID) ARPGX2  URINE CULTURE  URINALYSIS, ROUTINE W REFLEX MICROSCOPIC  POC OCCULT BLOOD, ED    EKG None  Radiology CT ABDOMEN PELVIS WO CONTRAST  Result Date: 10/31/2021 CLINICAL DATA:  85 year old female with abdominal pain. EXAM: CT ABDOMEN AND PELVIS WITHOUT CONTRAST TECHNIQUE: Multidetector CT imaging of the abdomen and pelvis was performed following the standard protocol without IV contrast. COMPARISON:  06/15/2019 FINDINGS: Lower chest: No acute abnormality. Hepatobiliary: No focal liver abnormality is seen. Status post cholecystectomy. No biliary dilatation. Pancreas: Similar appearing tubular cystic lesion about the pancreatic tail measuring up to approximately 1.3 cm, similar to comparison. No new pancreatic ductal dilation or focal mass. Spleen: Normal in size without focal abnormality. Adrenals/Urinary Tract: Similar appearing mild left adrenal gland thickening without discrete nodularity. Hip interval enlargement of previously described exophytic left solid renal mass arising from the posterior upper pole which measures up to 2.5 cm, previously 1.9 cm. Similar appearance of scattered simple cysts about the kidneys bilaterally. No hydronephrosis. Bladder is unremarkable. Stomach/Bowel: Stomach is within normal limits. Appendix appears normal. Pan colonic diverticula without surrounding inflammatory changes. No evidence of bowel wall thickening, distention, or inflammatory changes. Vascular/Lymphatic: Aortic atherosclerosis. No enlarged abdominal or pelvic lymph nodes. Reproductive: Status post hysterectomy. No adnexal masses. Other: Similar appearing small fat containing umbilical hernia. No abdominopelvic ascites. Musculoskeletal:  No acute osseous abnormality. Similar appearing multilevel degenerative changes of the visualized thoracolumbar spine. IMPRESSION: 1. No acute abdominopelvic abnormality. 2. Interval enlargement of previously described left upper pole exophytic solid mass from 1.9 cm to 2.5 cm since  2020. This remains suspicious for primary renal neoplasm. Further evaluation with contrast-enhanced ultrasound would be reasonable given intolerance to intravenous contrast agents. 3. Diverticulosis without evidence of diverticulitis. 4. Indeterminate similar appearing slightly decreased size of ovoid cystic lesion in the pancreatic tail, statistically favored represent a benign IPMN. Electronically Signed   By: Ruthann Cancer M.D.   On: 10/31/2021 14:34    Procedures Procedures   Medications Ordered in ED Medications  sodium chloride 0.9 % bolus 500 mL (0 mLs Intravenous Stopped 10/31/21 1228)  hydrALAZINE (APRESOLINE) injection 10 mg (10 mg Intravenous Given 10/31/21 1415)  sodium chloride 0.9 % bolus 500 mL (500 mLs Intravenous New Bag/Given 10/31/21 1416)    ED Course  I have reviewed the triage vital signs and the nursing notes.  Pertinent labs & imaging results that were available during my care of the patient were reviewed by me and considered in my medical decision making (see chart for details).  12:18p: Called lab who stated that the [place] had been done on 11. Results crossed over recently after phone call. Clinical Course as of 10/31/21 1503  Fri Oct 31, 2021  1317 Lipase(!): 378 [MR]    Clinical Course User Index [MR] Kenisha Lynds, Kelby Aline   MDM Rules/Calculators/A&P Patient fully evaluated by me and discussed with MD Pickering.  She has been experiencing chronic abdominal pain.  Has not had any imaging and blood tests are outside of our system.  Our blood work remarkable for lipase of 378, creatinine of 1.74 and GFR 29.  Liver function without abnormalities.  CT Noncon ordered and nonrevealing of  acute abnormalities.  Did not call for it did note a solid mass on the right kidney that was present in 2020.  Suspicious for renal cancer.  I shared these results with the patient and her loved 1 and they reported that they were not made aware of this 2 years ago.  There were no signs of pancreatic cancer on the Noncon scan.  At this time I we will admit the patient for decreased oral intake and kidney function. They are agreeable to this plan.  [facility] team accepts the patient for admission for decreased kidney function and decreased oral intake.   Final Clinical Impression(s) / ED Diagnoses Final diagnoses:  AKI (acute kidney injury) ([place])  Decreased oral intake       Rhae Hammock, PA-C 10/31/21 1528    Davonna Belling, MD 11/01/21 [phone]

## 2021-10-31 NOTE — Assessment & Plan Note (Signed)
-  Remote, no therapy, no current concerns

## 2021-10-31 NOTE — Assessment & Plan Note (Addendum)
-  Uncertain baseline - last renal function appears to be stage 3a CKD in 2020 -Likely some component of AKI given poor PO intake -Will hydrate and follow -Avoid nephrotoxic medications including Cozaar

## 2021-10-31 NOTE — Consult Note (Signed)
Referring Provider: [facility] PCP: Sonia Side., FNP  Gastroenterologist: Oretha Caprice, MD     Reason for consultation:   abdominal pain               ASSESSMENT / PLAN   #     85 year old female with generalized mid abdominal pain, mainly postprandial.  She has also had significant unintentional weight loss.   Her lipase is 378.  She has a remote history of mild pancreatitis in 2013 thought to be secondary to Lamisil.    No evidence for pancreatitis on today's noncontrast CT scan.  Some DDx to consider include pancreatitis, PUD and chronic mesenteric ischemia.     -- CTA not feasible right now due to AKI but consider this when renal function improves.  -- May need upper endoscopy to rule out PUD --Clear liquids are okay.  --Anti-emetics as needed --PPI.   #Renal lesion, interval increase in size. Sounds like she saw Urology last year and ? Apparently told not cancer and no workup needed .   # Indeterminate similar appearing slightly decreased size of ovoid cystic lesion in the pancreatic tail, statistically favored represent a benign IPMN.   # Additional medical history listed below.   HISTORY OF PRESENT ILLNESS                                                                                                                         Chief Complaint: abdominal pain   Linda [name] is a 85 y.o. female with a past medical history significant for CAD, hypertension, carotid artery disease status post endarterectomy, COVID-19, mild pancreatitis, breast cancer status postlumpectomy, renal mass, hysterectomy. See PMH for any additional medical problems.   In 2013 patient was felt to have mild pancreatitis attributed Lamisil . She was seen by [place] during a hospital admission July 2020 for evaluation of abdominal pain.  At that time her lipase and liver studies were normal.  Nothing really on CT scan to explain her pain.  There was concern for an ischemic etiology.  She was Hemoccult  positive.   Pain improved and she was discharged home with recommendations to follow-up with Dr. Ardis Hughs (not done)  ED course:  Patient presented to ED today for evaluation of abdominal pain.  She gives a 3 weeks history of stabbing postprandial abdominal pain. She cannot localized the pain other than it being in mid abdomen. Pain doesn't radiate to her back.  She describes the pain is clearly being related to eating but then says it also bothers her when she is sleeping.  When the pain started 3 weeks ago she had nausea and vomiting for 2 days.  No vomiting since but she has continued to have intermittent nausea.  She reports about a 40 pound weight loss over the last 6 months, 15 of those pounds being in the last few weeks .  She saw her PCP when the pain started about 3 weeks ago.  She was prescribed to different types of antibiotics for "infection in stomach" .  She completed antibiotics without any improvement in pain .  Shortly after starting the antibiotic she developed loose stool.  She has continued to have some loose bowel movements but this is improving.  She has not had any red blood in her stool nor black stools.  She does not take NSAIDs. Non Etoh drinker.   Regarding the renal lesion on CT scan.  Patient says she did see a specialist about this a year ago or so . She was subsequently scheduled for an ultrasound which apparently did not show the lesions.  No follow-up was recommended.  US showed that the  previously described indeterminate lesion at the posterior left kidney was not clearly seen. She had multiple bilateral renal cysts, largest measuring up to 4.3 cm on the left  ED labs:  lipase 378, alkaline phosphatase 76, AST 22, ALT 11, total bilirubin 0.6 BUN 47 creatinine 1.74 (it was 0.72 in September 2020) WBC 7.3 , hemoglobin 13.3, platelets 134  IMAGING:   CT ABDOMEN PELVIS WO CONTRAST  Result Date: 10/31/2021 CLINICAL DATA:  85 year old female with abdominal pain. EXAM: CT  ABDOMEN AND PELVIS WITHOUT CONTRAST TECHNIQUE: Multidetector CT imaging of the abdomen and pelvis was performed following the standard protocol without IV contrast. COMPARISON:  06/15/2019 FINDINGS: Lower chest: No acute abnormality. Hepatobiliary: No focal liver abnormality is seen. Status post cholecystectomy. No biliary dilatation. Pancreas: Similar appearing tubular cystic lesion about the pancreatic tail measuring up to approximately 1.3 cm, similar to comparison. No new pancreatic ductal dilation or focal mass. Spleen: Normal in size without focal abnormality. Adrenals/Urinary Tract: Similar appearing mild left adrenal gland thickening without discrete nodularity. Hip interval enlargement of previously described exophytic left solid renal mass arising from the posterior upper pole which measures up to 2.5 cm, previously 1.9 cm. Similar appearance of scattered simple cysts about the kidneys bilaterally. No hydronephrosis. Bladder is unremarkable. Stomach/Bowel: Stomach is within normal limits. Appendix appears normal. Pan colonic diverticula without surrounding inflammatory changes. No evidence of bowel wall thickening, distention, or inflammatory changes. Vascular/Lymphatic: Aortic atherosclerosis. No enlarged abdominal or pelvic lymph nodes. Reproductive: Status post hysterectomy. No adnexal masses. Other: Similar appearing small fat containing umbilical hernia. No abdominopelvic ascites. Musculoskeletal: No acute osseous abnormality. Similar appearing multilevel degenerative changes of the visualized thoracolumbar spine. IMPRESSION: 1. No acute abdominopelvic abnormality. 2. Interval enlargement of previously described left upper pole exophytic solid mass from 1.9 cm to 2.5 cm since 2020. This remains suspicious for primary renal neoplasm. Further evaluation with contrast-enhanced ultrasound would be reasonable given intolerance to intravenous contrast agents. 3. Diverticulosis without evidence of  diverticulitis. 4. Indeterminate similar appearing slightly decreased size of ovoid cystic lesion in the pancreatic tail, statistically favored represent a benign IPMN. Electronically Signed   By: Ruthann Cancer M.D.   On: 10/31/2021 14:34    PREVIOUS ENDOSCOPIC EVALUATIONS  / IMAGING STUDIES    March 2013 Colonoscopy  Complete exam, good prep --Diverticulosis.   July 2020 Noncontrast CTAP  IMPRESSION: 1. No acute abnormalities involving the abdomen or pelvis. 2. Diffuse colonic diverticulosis without evidence of acute diverticulitis. 3. Indeterminate exophytic mass arising from the mid LEFT kidney, not present on the prior CT. While this may represent a hyperdense cyst, it measures approximately 40 Hounsfield units which is the same as the normal unenhanced renal parenchyma, and therefore renal cell carcinoma is not excluded. Since the patient has acute renal insufficiency, MRI contrast should  not be administered currently; initially, renal ultrasound may be useful in follow-up. 4. Approximate 3 mm calcification in the distal body of the pancreas, likely related to prior pancreatitis. 5. Severe multifactorial spinal stenosis at L3-4.   Past Medical History:  Diagnosis Date   Arthritis    Carotid artery disease ([place]) 12/18/2013   CONSTIPATION, CHRONIC 04/23/2008   Qualifier: Diagnosis of  By: Harlon Ditty CMA (AAMA), Dottie     Diverticulosis of colon (without mention of hemorrhage)    Essential hypertension 04/23/2008   Qualifier: Diagnosis of  By: Nelson-Smith CMA (AAMA), Dottie     GERD (gastroesophageal reflux disease)    HLD (hyperlipidemia) 08/15/2019   Malignant neoplasm of female breast ([place]) 04/23/2008   Qualifier: History of  By: Harlon Ditty CMA (AAMA), Dottie     Nail fungus    Neck pain 11/07/2019   Pancreatitis, acute 09/28/2014   Peripheral vascular disease ([place]) 02/24/2012   Post herpetic neuralgia 11/22/2018   Primary osteoarthritis of both knees 11/07/2019    Tobacco abuse 09/28/2014   Uterine fibroid     Past Surgical History:  Procedure Laterality Date   ABDOMINAL HYSTERECTOMY     BREAST LUMPECTOMY Right 2004   radiation and chemo   CHOLECYSTECTOMY  ~1980   COLONOSCOPY     Hx; of   ENDARTERECTOMY Right 01/19/2014   Procedure: ENDARTERECTOMY CAROTID;  Surgeon: Serafina Mitchell, MD;  Location: [facility];  Service: Vascular;  Laterality: Right;   Right lumpectomy with right sentinel lymph node dissection  2004   Dr. Bubba Camp   VAGINAL HYSTERECTOMY      Prior to Admission medications   Medication Sig Start Date End Date Taking? Authorizing Provider  albuterol (VENTOLIN HFA) 108 (90 Base) MCG/ACT inhaler Inhale 1 puff into the lungs every 6 (six) hours as needed. 10/27/21  Yes [provider]  aspirin EC 81 MG tablet Take 1 tablet (81 mg total) by mouth daily. 02/24/12  Yes [name], MD  atorvastatin (LIPITOR) 40 MG tablet TAKE 1 TABLET BY MOUTH EACH EVENING Patient taking differently: Take 40 mg by mouth every evening. 04/04/20  Yes Luetta Nutting, DO  dexlansoprazole (DEXILANT) 60 MG capsule Take 1qd (Plz sched appt with new provider) Patient taking differently: Take 60 mg by mouth daily. 03/11/20  Yes Libby Maw, MD  gabapentin (NEURONTIN) 300 MG capsule TAKE 1 CAPSULE BY MOUTH EVERY DAY. No further refills. Schedule with Dustin Folks for follow up. Patient taking differently: Take 300 mg by mouth at bedtime. TAKE 1 CAPSULE BY MOUTH EVERY DAY. No further refills. Schedule with Dustin Folks for follow up. 06/18/20  Yes Libby Maw, MD  hydrALAZINE (APRESOLINE) 50 MG tablet Take 50 mg by mouth 3 (three) times daily. 12/25/20  Yes [provider]  losartan (COZAAR) 25 MG tablet Take 25 mg by mouth daily. 10/08/20  Yes [provider]  mirtazapine (REMERON) 15 MG tablet Take 15 mg by mouth at bedtime. 09/22/21  Yes [provider]  ondansetron (ZOFRAN) 4 MG tablet Take 4 mg by mouth every 8  (eight) hours as needed. 09/22/21  Yes [provider]  verapamil (CALAN-SR) 120 MG CR tablet Take 240 mg by mouth in the morning. 09/16/21  Yes [provider]  omeprazole (PRILOSEC) 20 MG capsule Take 1 capsule (20 mg total) by mouth daily. 12/10/11 02/24/12  P[place], Anderson Malta, PA-C    Current Facility-Administered Medications  Medication Dose Route Frequency Provider Last Rate Last Admin   acetaminophen (TYLENOL) tablet 650 mg  650 mg Oral Q6H PRN Karmen Bongo, MD       Or   acetaminophen (TYLENOL) suppository 650 mg  650 mg Rectal Q6H PRN Karmen Bongo, MD       albuterol (PROVENTIL) (2.5 MG/3ML) 0.083% nebulizer solution 3 mL  3 mL Inhalation Q6H PRN Karmen Bongo, MD       [START ON 11/01/2021] aspirin EC tablet 81 mg  81 mg Oral Daily Karmen Bongo, MD       atorvastatin (LIPITOR) tablet 40 mg  40 mg Oral QPM Karmen Bongo, MD       bisacodyl (DULCOLAX) EC tablet 5 mg  5 mg Oral Daily PRN Karmen Bongo, MD       docusate sodium (COLACE) capsule 100 mg  100 mg Oral BID Karmen Bongo, MD       enoxaparin (LOVENOX) injection 30 mg  30 mg Subcutaneous Q24H Karmen Bongo, MD       fentaNYL (SUBLIMAZE) injection 12.5 mcg  12.5 mcg Intravenous Q2H PRN Karmen Bongo, MD       gabapentin (NEURONTIN) capsule 300 mg  300 mg Oral QHS Karmen Bongo, MD       hydrALAZINE (APRESOLINE) injection 5 mg  5 mg Intravenous Q4H PRN Karmen Bongo, MD       hydrALAZINE (APRESOLINE) tablet 50 mg  50 mg Oral TID Karmen Bongo, MD       lactated ringers infusion   Intravenous Continuous Karmen Bongo, MD       mirtazapine (REMERON) tablet 15 mg  15 mg Oral QHS Karmen Bongo, MD       nicotine (NICODERM CQ - dosed in mg/24 hours) patch 14 mg  14 mg Transdermal Q24H Karmen Bongo, MD       ondansetron [facility]) tablet 4 mg  4 mg Oral Q6H PRN Karmen Bongo, MD       Or   ondansetron [facility]) injection 4 mg  4 mg Intravenous Q6H PRN Karmen Bongo, MD       oxyCODONE  (Oxy IR/ROXICODONE) immediate release tablet 5 mg  5 mg Oral Q4H PRN Karmen Bongo, MD       pantoprazole (PROTONIX) injection 40 mg  40 mg Intravenous Q24H Karmen Bongo, MD       polyethylene glycol (MIRALAX / GLYCOLAX) packet 17 g  17 g Oral Daily PRN Karmen Bongo, MD       Derrill Memo ON 11/01/2021] verapamil (CALAN-SR) CR tablet 240 mg  240 mg Oral q AM Karmen Bongo, MD       Current Outpatient Medications  Medication Sig Dispense Refill   albuterol (VENTOLIN HFA) 108 (90 Base) MCG/ACT inhaler Inhale 1 puff into the lungs every 6 (six) hours as needed.     aspirin EC 81 MG tablet Take 1 tablet (81 mg total) by mouth daily. 1 tablet 0   atorvastatin (LIPITOR) 40 MG tablet TAKE 1 TABLET BY MOUTH EACH EVENING (Patient taking differently: Take 40 mg by mouth every evening.) 90 tablet 0   dexlansoprazole (DEXILANT) 60 MG capsule Take 1qd (Plz sched appt with new provider) (Patient taking differently: Take 60 mg by mouth daily.) 30 capsule 1   gabapentin (NEURONTIN) 300 MG capsule TAKE 1 CAPSULE BY MOUTH EVERY DAY. No further refills. Schedule with Dustin Folks for follow up. (Patient taking differently: Take 300 mg by mouth at bedtime. TAKE 1 CAPSULE BY MOUTH EVERY DAY. No further refills. Schedule with Dustin Folks for follow up.) 90 capsule 0   hydrALAZINE (APRESOLINE) 50 MG tablet Take  50 mg by mouth 3 (three) times daily.     losartan (COZAAR) 25 MG tablet Take 25 mg by mouth daily.     mirtazapine (REMERON) 15 MG tablet Take 15 mg by mouth at bedtime.     ondansetron (ZOFRAN) 4 MG tablet Take 4 mg by mouth every 8 (eight) hours as needed.     verapamil (CALAN-SR) 120 MG CR tablet Take 240 mg by mouth in the morning.      Allergies as of 10/31/2021   (No Known Allergies)    Family History  Problem Relation Age of Onset   Colon cancer Mother    Cancer Mother        Kidney   Pneumonia Father 47       Double   Colon cancer Maternal Grandmother     Social History   Socioeconomic  History   Marital status: Single    Spouse name: Not on file   Number of children: 2   Years of education: Not on file   Highest education level: Not on file  Occupational History   Occupation: Retired  Tobacco Use   Smoking status: Every Day    Packs/day: 1.00    Years: 58.00    Pack years: 58.00    Types: Cigarettes   Smokeless tobacco: Never   Tobacco comments:    form given 12/14/11  Substance and Sexual Activity   Alcohol use: No    Alcohol/week: 0.0 standard drinks   Drug use: No   Sexual activity: Never  Other Topics Concern   Not on file  Social History Narrative   Not on file   Social Determinants of Health   Financial Resource Strain: Not on file  Food Insecurity: Not on file  Transportation Needs: Not on file  Physical Activity: Not on file  Stress: Not on file  Social Connections: Not on file  Intimate Partner Violence: Not on file    Review of Systems: All systems reviewed and negative except where noted in HPI.   OBJECTIVE    Physical Exam: Vital signs in last 24 hours: Temp:  [97.5 F (36.4 C)] 97.5 F (36.4 C) (11/25 0928) Pulse Rate:  [80-101] 88 (11/25 1430) Resp:  [17-22] 21 (11/25 1430) BP: (142-195)/(75-110) 168/76 (11/25 1430) SpO2:  [98 %-100 %] 100 % (11/25 1430) Weight:  [69.8 kg] 69.8 kg (11/25 0928)    General:  Alert thin female in NAD Psych:  Pleasant, cooperative. Normal mood and affect Eyes: Pupils equal, no icterus. Conjunctive pink Ears:  Normal auditory acuity Nose: No deformity, discharge or lesions Neck:  Supple, no masses felt Lungs:  Clear to auscultation.  Heart:  Regular rate, regular rhythm. Murmur present. No lower extremity edema Abdomen:  Soft, nondistended, nontender, active bowel sounds, no masses felt Rectal :  Deferred Msk: Symmetrical without gross deformities.  Neurologic:  Alert, oriented, grossly normal neurologically Skin:  Intact without significant lesions.    Scheduled inpatient medications   [START ON 11/01/2021] aspirin EC  81 mg Oral Daily   atorvastatin  40 mg Oral QPM   docusate sodium  100 mg Oral BID   enoxaparin (LOVENOX) injection  30 mg Subcutaneous Q24H   gabapentin  300 mg Oral QHS   hydrALAZINE  50 mg Oral TID   mirtazapine  15 mg Oral QHS   nicotine  14 mg Transdermal Q24H   pantoprazole (PROTONIX) IV  40 mg Intravenous Q24H   [START ON 11/01/2021] verapamil  240 mg Oral q  AM      Intake/Output from previous day: No intake/output data recorded. Intake/Output this shift: Total I/O In: 1000 [IV Piggyback:1000] Out: -    Lab Results: Recent Labs    10/31/21 0955  WBC 7.3  HGB 13.3  HCT 42.4  PLT 134*   BMET Recent Labs    10/31/21 0955  NA 141  K 4.5  CL 114*  CO2 20*  GLUCOSE 83  BUN 47*  CREATININE 1.74*  CALCIUM 9.6   LFTs Recent Labs    10/31/21 0955  PROT 6.8  ALBUMIN 3.2*  AST 22  ALT 11  ALKPHOS 76  BILITOT 0.6   PT/INR No results for input(s): LABPROT, INR in the last 72 hours. Hepatitis Panel No results for input(s): HEPBSAG, HCVAB, HEPAIGM, HEPBIGM in the last 72 hours.   . CBC Latest Ref Rng & Units 10/31/2021 08/15/2019 06/20/2019  WBC 4.0 - 10.5 K/uL 7.3 6.8 8.3  Hemoglobin 12.0 - 15.0 g/dL 13.3 12.2 11.6(L)  Hematocrit 36.0 - 46.0 % 42.4 38.2 36.5  Platelets 150 - 400 K/uL 134(L) 171.0 196    . CMP Latest Ref Rng & Units 10/31/2021 08/23/2019 08/15/2019  Glucose 70 - 99 mg/dL 83 98 95  BUN 8 - 23 mg/dL 47(H) 18 17  Creatinine 0.44 - 1.00 mg/dL 1.74(H) 0.72 1.05  Sodium 135 - 145 mmol/L 141 144 141  Potassium 3.5 - 5.1 mmol/L 4.5 4.8 5.3 No hemolysis seen(H)  Chloride 98 - 111 mmol/L 114(H) 106 109  CO2 22 - 32 mmol/L 20(L) 25 24  Calcium 8.9 - 10.3 mg/dL 9.6 10.0 9.5  Total Protein 6.5 - 8.1 g/dL 6.8 - 7.1  Total Bilirubin 0.3 - 1.2 mg/dL 0.6 - 0.3  Alkaline Phos 38 - 126 U/L 76 - 98  AST 15 - 41 U/L 22 - 15  ALT 0 - 44 U/L 11 - 9     Principal Problem:   Abdominal pain    Tye Savoy, NP-C @   10/31/2021, 4:19 PM

## 2021-10-31 NOTE — Assessment & Plan Note (Signed)
-  Patient had mass on CT in 2020 but not noted on [place] in 2021 -Mass has now enlarged -Contrast-enhanced [place] ordered as per radiology recommendation, but this is requested to be performed as an outpatient according to rad tech/Dr. Serafina Royals -Will need outpatient f/u

## 2021-10-31 NOTE — Assessment & Plan Note (Signed)
-  Continue Lipitor °

## 2021-10-31 NOTE — Assessment & Plan Note (Signed)
-  Patient with report of prior "pancreatitis" presenting with vague abdominal pain (points to RLQ/spurapubic region) and poor PO intake with burning in her stomach when she eats -Based on prior description, she appears to have been recently diagnosed with H pylori and did not complete therapy -Suspect esophageal/gastric ulcer -Mildly elevated lipase -Normal abdominal CT -Low suspicion for acte pancreatitis, possibly chronic -Will observe on med surg -GI consult -IV Protonix daily -Will add Carafate

## 2021-10-31 NOTE — Assessment & Plan Note (Signed)
-  Encourage cessation.   °-This was discussed with the patient and should be reviewed on an ongoing basis.   °-Patch ordered at patient request. °

## 2021-11-01 DIAGNOSIS — Z79899 Other long term (current) drug therapy: Secondary | ICD-10-CM | POA: Diagnosis not present

## 2021-11-01 DIAGNOSIS — R634 Abnormal weight loss: Secondary | ICD-10-CM | POA: Diagnosis not present

## 2021-11-01 DIAGNOSIS — N281 Cyst of kidney, acquired: Secondary | ICD-10-CM | POA: Diagnosis present

## 2021-11-01 DIAGNOSIS — E871 Hypo-osmolality and hyponatremia: Secondary | ICD-10-CM | POA: Diagnosis not present

## 2021-11-01 DIAGNOSIS — I10 Essential (primary) hypertension: Secondary | ICD-10-CM | POA: Diagnosis present

## 2021-11-01 DIAGNOSIS — K862 Cyst of pancreas: Secondary | ICD-10-CM | POA: Diagnosis present

## 2021-11-01 DIAGNOSIS — R638 Other symptoms and signs concerning food and fluid intake: Secondary | ICD-10-CM | POA: Diagnosis not present

## 2021-11-01 DIAGNOSIS — K279 Peptic ulcer, site unspecified, unspecified as acute or chronic, without hemorrhage or perforation: Secondary | ICD-10-CM | POA: Diagnosis present

## 2021-11-01 DIAGNOSIS — R630 Anorexia: Secondary | ICD-10-CM | POA: Diagnosis not present

## 2021-11-01 DIAGNOSIS — E43 Unspecified severe protein-calorie malnutrition: Secondary | ICD-10-CM | POA: Diagnosis present

## 2021-11-01 DIAGNOSIS — I2510 Atherosclerotic heart disease of native coronary artery without angina pectoris: Secondary | ICD-10-CM | POA: Diagnosis present

## 2021-11-01 DIAGNOSIS — E440 Moderate protein-calorie malnutrition: Secondary | ICD-10-CM | POA: Diagnosis not present

## 2021-11-01 DIAGNOSIS — N2889 Other specified disorders of kidney and ureter: Secondary | ICD-10-CM | POA: Diagnosis present

## 2021-11-01 DIAGNOSIS — K2990 Gastroduodenitis, unspecified, without bleeding: Secondary | ICD-10-CM | POA: Diagnosis present

## 2021-11-01 DIAGNOSIS — J9601 Acute respiratory failure with hypoxia: Secondary | ICD-10-CM | POA: Diagnosis not present

## 2021-11-01 DIAGNOSIS — R1010 Upper abdominal pain, unspecified: Secondary | ICD-10-CM | POA: Diagnosis not present

## 2021-11-01 DIAGNOSIS — K219 Gastro-esophageal reflux disease without esophagitis: Secondary | ICD-10-CM | POA: Diagnosis present

## 2021-11-01 DIAGNOSIS — R1084 Generalized abdominal pain: Secondary | ICD-10-CM | POA: Diagnosis not present

## 2021-11-01 DIAGNOSIS — N179 Acute kidney failure, unspecified: Secondary | ICD-10-CM | POA: Diagnosis present

## 2021-11-01 DIAGNOSIS — R109 Unspecified abdominal pain: Secondary | ICD-10-CM | POA: Diagnosis not present

## 2021-11-01 DIAGNOSIS — R64 Cachexia: Secondary | ICD-10-CM | POA: Diagnosis present

## 2021-11-01 DIAGNOSIS — E785 Hyperlipidemia, unspecified: Secondary | ICD-10-CM | POA: Diagnosis present

## 2021-11-01 DIAGNOSIS — J690 Pneumonitis due to inhalation of food and vomit: Secondary | ICD-10-CM | POA: Diagnosis not present

## 2021-11-01 DIAGNOSIS — N289 Disorder of kidney and ureter, unspecified: Secondary | ICD-10-CM | POA: Diagnosis present

## 2021-11-01 DIAGNOSIS — K3189 Other diseases of stomach and duodenum: Secondary | ICD-10-CM | POA: Diagnosis not present

## 2021-11-01 DIAGNOSIS — D696 Thrombocytopenia, unspecified: Secondary | ICD-10-CM | POA: Diagnosis present

## 2021-11-01 DIAGNOSIS — K859 Acute pancreatitis without necrosis or infection, unspecified: Secondary | ICD-10-CM | POA: Diagnosis present

## 2021-11-01 DIAGNOSIS — A419 Sepsis, unspecified organism: Secondary | ICD-10-CM | POA: Diagnosis not present

## 2021-11-01 DIAGNOSIS — R1013 Epigastric pain: Secondary | ICD-10-CM | POA: Diagnosis not present

## 2021-11-01 DIAGNOSIS — I739 Peripheral vascular disease, unspecified: Secondary | ICD-10-CM | POA: Diagnosis present

## 2021-11-01 DIAGNOSIS — K2970 Gastritis, unspecified, without bleeding: Secondary | ICD-10-CM | POA: Diagnosis present

## 2021-11-01 DIAGNOSIS — F17210 Nicotine dependence, cigarettes, uncomplicated: Secondary | ICD-10-CM | POA: Diagnosis present

## 2021-11-01 DIAGNOSIS — Z20822 Contact with and (suspected) exposure to covid-19: Secondary | ICD-10-CM | POA: Diagnosis present

## 2021-11-01 DIAGNOSIS — K861 Other chronic pancreatitis: Secondary | ICD-10-CM | POA: Diagnosis present

## 2021-11-01 DIAGNOSIS — Z66 Do not resuscitate: Secondary | ICD-10-CM | POA: Diagnosis present

## 2021-11-01 LAB — CBC
HCT: 34.8 % — ABNORMAL LOW (ref 36.0–46.0)
Hemoglobin: 11.1 g/dL — ABNORMAL LOW (ref 12.0–15.0)
MCH: 26.7 pg (ref 26.0–34.0)
MCHC: 31.9 g/dL (ref 30.0–36.0)
MCV: 83.7 fL (ref 80.0–100.0)
Platelets: 112 10*3/uL — ABNORMAL LOW (ref 150–400)
RBC: 4.16 MIL/uL (ref 3.87–5.11)
RDW: 15.1 % (ref 11.5–15.5)
WBC: 6.1 10*3/uL (ref 4.0–10.5)
nRBC: 0 % (ref 0.0–0.2)

## 2021-11-01 LAB — BASIC METABOLIC PANEL
Anion gap: 6 (ref 5–15)
BUN: 41 mg/dL — ABNORMAL HIGH (ref 8–23)
CO2: 18 mmol/L — ABNORMAL LOW (ref 22–32)
Calcium: 8.6 mg/dL — ABNORMAL LOW (ref 8.9–10.3)
Chloride: 114 mmol/L — ABNORMAL HIGH (ref 98–111)
Creatinine, Ser: 1.47 mg/dL — ABNORMAL HIGH (ref 0.44–1.00)
GFR, Estimated: 35 mL/min — ABNORMAL LOW (ref >60)
Glucose, Bld: 77 mg/dL (ref 70–99)
Potassium: 4 mmol/L (ref 3.5–5.1)
Sodium: 138 mmol/L (ref 135–145)

## 2021-11-01 LAB — LIPASE, BLOOD: Lipase: 640 U/L — ABNORMAL HIGH (ref 11–51)

## 2021-11-01 MED ORDER — LACTATED RINGERS IV SOLN: INTRAVENOUS | Status: AC

## 2021-11-01 NOTE — Progress Notes (Signed)
PROGRESS NOTE    Linda [name]  YDX:[id number] DOB: 15-Aug-1936 DOA: 10/31/2021 PCP: Sonia Side., FNP  Brief Narrative: 85/F with remote history of breast cancer, hypertension, dyslipidemia, peripheral vascular disease,?  Left renal mass, who presented to the ED with lower abdominal pain and discomfort for a few weeks. -In addition also has had heartburn with bitter taste in her mouth, pain is worse after eating, has been eating and drinking less, has lost a lot of weight. -Saw her PCP recently and was started on medication for infection in her throat and stomach ?  Thrush vs Hpylori, after which she developed diarrhea -In the emergency room she was noted to have a lipase of 378, creatinine of 1.7, CT abdomen noted left upper pole renal mass slightly larger than 2020 and a cystic lesion in the pancreatic tail   Assessment & Plan:   Abdominal pain, weight loss -Seems multifactorial, may have a component of chronic pancreatitis and GERD/PUD -Continue IV PPI, Carafate -Continue clears today, IV fluids -Gastroenterology consulting -CTA abdomen being considered to rule out chronic mesenteric ischemia  Left renal mass -Larger compared to imaging in 2020, concerning for RCC, will need urgent urology referral  Indeterminant cystic lesion in pancreatic tail -Likely benign  Moderate protein calorie malnutrition -Patient reports significant weight loss in the setting of above -RD consult, add supplements as diet advanced  Acute kidney injury -Baseline creatinine was normal in 2018, 1.7 on admission -Hold ARB, continue IV fluids today  Tobacco use -Counseled  Essential hypertension -Continue verapamil, hydralazine -Cozaar on hold  Remote history of breast cancer -Stable   DVT prophylaxis: Lovenox Code Status: DNR Family Communication: Discussed with patient in detail, no family at bedside Disposition Plan:  Status is: Observation  The patient will require care spanning >  2 midnights and should be moved to inpatient because: Severity of illness       Consultants:  Gastroenterology  Procedures:   Antimicrobials:    Subjective: -Reports ongoing abdominal pain, discomfort for 3 weeks, also symptoms of heartburn and reflux -Reports significant weight loss  Objective: Vitals:   10/31/21 2115 10/31/21 2235 10/31/21 2304 11/01/21 0829  BP: 132/84 127/76 (!) 158/55 (!) 162/70  Pulse: (!) 112 99 100 (!) 106  Resp: (!) 33 (!) 22 17 16   Temp:  98.4 F (36.9 C) 98.2 F (36.8 C) 98.1 F (36.7 C)  TempSrc:  Oral  Oral  SpO2: 99% 100% (!) 89% 99%  Weight:      Height:        Intake/Output Summary (Last 24 hours) at 11/01/2021 1053 Last data filed at 11/01/2021 0300 Gross per 24 hour  Intake 1732.87 ml  Output --  Net 1732.87 ml   Filed Weights   10/31/21 0928  Weight: 69.8 kg    Examination:  General exam: Pleasant chronically ill elderly female laying in bed, AAO x3, no distress CVS: S1-S2, regular rate rhythm Lungs: Clear bilaterally Abdomen: Soft, minimal left lower quadrant tenderness and periumbilical fullness, bowel sounds present Extremities: No edema Skin: No rash on exposed skin Psych: Flat affect   Data Reviewed:   CBC: Recent Labs  Lab 10/31/21 0955 11/01/21 0159  WBC 7.3 6.1  NEUTROABS 4.9  --   HGB 13.3 11.1*  HCT 42.4 34.8*  MCV 85.0 83.7  PLT 134* 720*   Basic Metabolic Panel: Recent Labs  Lab 10/31/21 0955 11/01/21 0159  NA 141 138  K 4.5 4.0  CL 114* 114*  CO2 20*  18*  GLUCOSE 83 77  BUN 47* 41*  CREATININE 1.74* 1.47*  CALCIUM 9.6 8.6*   GFR: Estimated Creatinine Clearance: 25.9 mL/min (A) (by C-G formula based on SCr of 1.47 mg/dL (H)). Liver Function Tests: Recent Labs  Lab 10/31/21 0955  AST 22  ALT 11  ALKPHOS 76  BILITOT 0.6  PROT 6.8  ALBUMIN 3.2*   Recent Labs  Lab 10/31/21 0955  LIPASE 378*   No results for input(s): AMMONIA in the last 168 hours. Coagulation Profile: No  results for input(s): INR, PROTIME in the last 168 hours. Cardiac Enzymes: No results for input(s): CKTOTAL, CKMB, CKMBINDEX, TROPONINI in the last 168 hours. BNP (last 3 results) No results for input(s): PROBNP in the last 8760 hours. HbA1C: No results for input(s): HGBA1C in the last 72 hours. CBG: No results for input(s): GLUCAP in the last 168 hours. Lipid Profile: No results for input(s): CHOL, HDL, LDLCALC, TRIG, CHOLHDL, LDLDIRECT in the last 72 hours. Thyroid Function Tests: No results for input(s): TSH, T4TOTAL, FREET4, T3FREE, THYROIDAB in the last 72 hours. Anemia Panel: No results for input(s): VITAMINB12, FOLATE, FERRITIN, TIBC, IRON, RETICCTPCT in the last 72 hours. Urine analysis:    Component Value Date/Time   COLORURINE YELLOW 06/15/2019 1310   APPEARANCEUR CLEAR 06/15/2019 1310   LABSPEC 1.013 06/15/2019 1310   PHURINE 5.0 06/15/2019 1310   GLUCOSEU NEGATIVE 06/15/2019 1310   HGBUR MODERATE (A) 06/15/2019 1310   BILIRUBINUR NEGATIVE 06/15/2019 1310   BILIRUBINUR NEGATIVE 06/14/2019 1232   KETONESUR NEGATIVE 06/15/2019 1310   PROTEINUR 100 (A) 06/15/2019 1310   UROBILINOGEN 0.2 06/14/2019 1232   UROBILINOGEN 1.0 09/27/2014 2044   NITRITE NEGATIVE 06/15/2019 1310   LEUKOCYTESUR LARGE (A) 06/15/2019 1310   Sepsis Labs: @LABRCNTIP (procalcitonin:4,lacticidven:4)  ) Recent Results (from the past 240 hour(s))  Resp Panel by RT-PCR (Flu A&B, Covid) Nasopharyngeal Swab     Status: None   Collection Time: 10/31/21 10:00 AM   Specimen: Nasopharyngeal Swab; Nasopharyngeal(NP) swabs in vial transport medium  Result Value Ref Range Status   SARS Coronavirus 2 by RT PCR NEGATIVE NEGATIVE Final    Comment: (NOTE) SARS-CoV-2 target nucleic acids are NOT DETECTED.  The SARS-CoV-2 RNA is generally detectable in upper respiratory specimens during the acute phase of infection. The lowest concentration of SARS-CoV-2 viral copies this assay can detect is 138 copies/mL. A  negative result does not preclude SARS-Cov-2 infection and should not be used as the sole basis for treatment or other patient management decisions. A negative result may occur with  improper specimen collection/handling, submission of specimen other than nasopharyngeal swab, presence of viral mutation(s) within the areas targeted by this assay, and inadequate number of viral copies(<138 copies/mL). A negative result must be combined with clinical observations, patient history, and epidemiological information. The expected result is Negative.  Fact Sheet for Patients:  EntrepreneurPulse.com.au  Fact Sheet for Healthcare Providers:  IncredibleEmployment.be  This test is no t yet approved or cleared by the [place] FDA and  has been authorized for detection and/or diagnosis of SARS-CoV-2 by FDA under an Emergency Use Authorization (EUA). This EUA will remain  in effect (meaning this test can be used) for the duration of the COVID-19 declaration under Section 564(b)(1) of the Act, 21 U.S.C.section 360bbb-3(b)(1), unless the authorization is terminated  or revoked sooner.       Influenza A by PCR NEGATIVE NEGATIVE Final   Influenza B by PCR NEGATIVE NEGATIVE Final    Comment: (NOTE) The Xpert  Xpress SARS-CoV-2/FLU/RSV plus assay is intended as an aid in the diagnosis of influenza from Nasopharyngeal swab specimens and should not be used as a sole basis for treatment. Nasal washings and aspirates are unacceptable for Xpert Xpress SARS-CoV-2/FLU/RSV testing.  Fact Sheet for Patients: EntrepreneurPulse.com.au  Fact Sheet for Healthcare Providers: IncredibleEmployment.be  This test is not yet approved or cleared by the [place] FDA and has been authorized for detection and/or diagnosis of SARS-CoV-2 by FDA under an Emergency Use Authorization (EUA). This EUA will remain in effect (meaning this test can  be used) for the duration of the COVID-19 declaration under Section 564(b)(1) of the Act, 21 U.S.C. section 360bbb-3(b)(1), unless the authorization is terminated or revoked.  Performed at [facility], [place] [address], [place], [place] [address]     Radiology Studies: CT ABDOMEN PELVIS WO CONTRAST  Result Date: 10/31/2021 CLINICAL DATA:  85 year old female with abdominal pain. EXAM: CT ABDOMEN AND PELVIS WITHOUT CONTRAST TECHNIQUE: Multidetector CT imaging of the abdomen and pelvis was performed following the standard protocol without IV contrast. COMPARISON:  06/15/2019 FINDINGS: Lower chest: No acute abnormality. Hepatobiliary: No focal liver abnormality is seen. Status post cholecystectomy. No biliary dilatation. Pancreas: Similar appearing tubular cystic lesion about the pancreatic tail measuring up to approximately 1.3 cm, similar to comparison. No new pancreatic ductal dilation or focal mass. Spleen: Normal in size without focal abnormality. Adrenals/Urinary Tract: Similar appearing mild left adrenal gland thickening without discrete nodularity. Hip interval enlargement of previously described exophytic left solid renal mass arising from the posterior upper pole which measures up to 2.5 cm, previously 1.9 cm. Similar appearance of scattered simple cysts about the kidneys bilaterally. No hydronephrosis. Bladder is unremarkable. Stomach/Bowel: Stomach is within normal limits. Appendix appears normal. Pan colonic diverticula without surrounding inflammatory changes. No evidence of bowel wall thickening, distention, or inflammatory changes. Vascular/Lymphatic: Aortic atherosclerosis. No enlarged abdominal or pelvic lymph nodes. Reproductive: Status post hysterectomy. No adnexal masses. Other: Similar appearing small fat containing umbilical hernia. No abdominopelvic ascites. Musculoskeletal: No acute osseous abnormality. Similar appearing multilevel degenerative changes of the visualized  thoracolumbar spine. IMPRESSION: 1. No acute abdominopelvic abnormality. 2. Interval enlargement of previously described left upper pole exophytic solid mass from 1.9 cm to 2.5 cm since 2020. This remains suspicious for primary renal neoplasm. Further evaluation with contrast-enhanced ultrasound would be reasonable given intolerance to intravenous contrast agents. 3. Diverticulosis without evidence of diverticulitis. 4. Indeterminate similar appearing slightly decreased size of ovoid cystic lesion in the pancreatic tail, statistically favored represent a benign IPMN. Electronically Signed   By: Ruthann Cancer M.D.   On: 10/31/2021 14:34     Scheduled Meds:  aspirin EC  81 mg Oral Daily   atorvastatin  40 mg Oral QPM   docusate sodium  100 mg Oral BID   enoxaparin (LOVENOX) injection  30 mg Subcutaneous Q24H   gabapentin  300 mg Oral QHS   hydrALAZINE  50 mg Oral TID   mirtazapine  15 mg Oral QHS   nicotine  14 mg Transdermal Q24H   pantoprazole (PROTONIX) IV  40 mg Intravenous Q24H   sucralfate  1 g Oral TID WC & HS   verapamil  240 mg Oral q AM   Continuous Infusions:  lactated ringers       LOS: 0 days    Time spent: 36min  Domenic Polite, MD [facility]   11/01/2021, 10:53 AM

## 2021-11-01 NOTE — Progress Notes (Signed)
Progress Note Hospital Day: 2  Chief Complaint:    abdominal pain      ASSESSMENT AND PLAN    # 85 year old female with generalized mid abdominal pain, mainly postprandial with associated significant unintentional weight loss.   Her lipase is 378.  She has a remote history of mild pancreatitis in 2013 thought to be secondary to Lamisil.    No evidence for pancreatitis on today's noncontrast CT scan.  DDx considerations  include pancreatitis, PUD and chronic mesenteric ischemia.     --Hopefully a CTA tomorrow if renal function better. Creatinine 1.74 >>> 1.47.  --May need upper endoscopy to rule out PUD if CTA negative --Clear liquids are okay.  --Anti-emetics as needed --PPI.    #Renal lesion, interval increase in size. Sounds like she saw Urology last year and ? Apparently told not cancer and no workup needed .    # Indeterminate similar appearing slightly decreased size of ovoid cystic lesion in the pancreatic tail, statistically favored represent a benign IPMN.    SUBJECTIVE   No complaints. She was able to eat jello today without any abdominal pain      OBJECTIVE      Scheduled inpatient medications:   aspirin EC  81 mg Oral Daily   atorvastatin  40 mg Oral QPM   docusate sodium  100 mg Oral BID   enoxaparin (LOVENOX) injection  30 mg Subcutaneous Q24H   gabapentin  300 mg Oral QHS   hydrALAZINE  50 mg Oral TID   mirtazapine  15 mg Oral QHS   nicotine  14 mg Transdermal Q24H   pantoprazole (PROTONIX) IV  40 mg Intravenous Q24H   sucralfate  1 g Oral TID WC & HS   verapamil  240 mg Oral q AM   Continuous inpatient infusions:   lactated ringers 75 mL/hr at 11/01/21 1151   PRN inpatient medications: acetaminophen **OR** acetaminophen, albuterol, bisacodyl, fentaNYL (SUBLIMAZE) injection, hydrALAZINE, ondansetron **OR** ondansetron (ZOFRAN) IV, oxyCODONE, polyethylene glycol  Vital signs in last 24 hours: Temp:  [98.1 F (36.7 C)-98.4 F (36.9 C)] 98.1 F  (36.7 C) (11/26 0829) Pulse Rate:  [88-112] 108 (11/26 1152) Resp:  [14-33] 16 (11/26 1152) BP: (104-168)/(55-95) 141/64 (11/26 1152) SpO2:  [89 %-100 %] 96 % (11/26 1152)    Intake/Output Summary (Last 24 hours) at 11/01/2021 1421 Last data filed at 11/01/2021 0300 Gross per 24 hour  Intake 1232.87 ml  Output --  Net 1232.87 ml     Physical Exam:  General: Alert thin female in NAD Heart:  Regular rate and rhythm. No lower extremity edema Pulmonary: Normal respiratory effort Abdomen: Soft, nondistended, nontender. Normal bowel sounds.  Neurologic: Alert and oriented Psych: Pleasant. Cooperative.   Filed Weights   10/31/21 0928  Weight: 69.8 kg    Intake/Output from previous day: 11/25 0701 - 11/26 0700 In: 1732.9 [I.V.:732.9; IV Piggyback:1000] Out: -  Intake/Output this shift: No intake/output data recorded.    Lab Results: Recent Labs    10/31/21 0955 11/01/21 0159  WBC 7.3 6.1  HGB 13.3 11.1*  HCT 42.4 34.8*  PLT 134* 112*   BMET Recent Labs    10/31/21 0955 11/01/21 0159  NA 141 138  K 4.5 4.0  CL 114* 114*  CO2 20* 18*  GLUCOSE 83 77  BUN 47* 41*  CREATININE 1.74* 1.47*  CALCIUM 9.6 8.6*   LFT Recent Labs    10/31/21 0955  PROT 6.8  ALBUMIN 3.2*  AST 22  ALT 11  ALKPHOS 76  BILITOT 0.6   PT/INR No results for input(s): LABPROT, INR in the last 72 hours. Hepatitis Panel No results for input(s): HEPBSAG, HCVAB, HEPAIGM, HEPBIGM in the last 72 hours.  CT ABDOMEN PELVIS WO CONTRAST  Result Date: 10/31/2021 CLINICAL DATA:  85 year old female with abdominal pain. EXAM: CT ABDOMEN AND PELVIS WITHOUT CONTRAST TECHNIQUE: Multidetector CT imaging of the abdomen and pelvis was performed following the standard protocol without IV contrast. COMPARISON:  06/15/2019 FINDINGS: Lower chest: No acute abnormality. Hepatobiliary: No focal liver abnormality is seen. Status post cholecystectomy. No biliary dilatation. Pancreas: Similar appearing tubular  cystic lesion about the pancreatic tail measuring up to approximately 1.3 cm, similar to comparison. No new pancreatic ductal dilation or focal mass. Spleen: Normal in size without focal abnormality. Adrenals/Urinary Tract: Similar appearing mild left adrenal gland thickening without discrete nodularity. Hip interval enlargement of previously described exophytic left solid renal mass arising from the posterior upper pole which measures up to 2.5 cm, previously 1.9 cm. Similar appearance of scattered simple cysts about the kidneys bilaterally. No hydronephrosis. Bladder is unremarkable. Stomach/Bowel: Stomach is within normal limits. Appendix appears normal. Pan colonic diverticula without surrounding inflammatory changes. No evidence of bowel wall thickening, distention, or inflammatory changes. Vascular/Lymphatic: Aortic atherosclerosis. No enlarged abdominal or pelvic lymph nodes. Reproductive: Status post hysterectomy. No adnexal masses. Other: Similar appearing small fat containing umbilical hernia. No abdominopelvic ascites. Musculoskeletal: No acute osseous abnormality. Similar appearing multilevel degenerative changes of the visualized thoracolumbar spine. IMPRESSION: 1. No acute abdominopelvic abnormality. 2. Interval enlargement of previously described left upper pole exophytic solid mass from 1.9 cm to 2.5 cm since 2020. This remains suspicious for primary renal neoplasm. Further evaluation with contrast-enhanced ultrasound would be reasonable given intolerance to intravenous contrast agents. 3. Diverticulosis without evidence of diverticulitis. 4. Indeterminate similar appearing slightly decreased size of ovoid cystic lesion in the pancreatic tail, statistically favored represent a benign IPMN. Electronically Signed   By: Ruthann Cancer M.D.   On: 10/31/2021 14:34        Principal Problem:   Abdominal pain Active Problems:   Malignant neoplasm of female breast [facility])   Essential hypertension    Tobacco abuse   AKI (acute kidney injury) ([place])   HLD (hyperlipidemia)   Renal mass, left     LOS: 0 days   Tye Savoy ,NP 11/01/2021, 2:21 PM

## 2021-11-02 DIAGNOSIS — R1010 Upper abdominal pain, unspecified: Secondary | ICD-10-CM | POA: Diagnosis not present

## 2021-11-02 DIAGNOSIS — R109 Unspecified abdominal pain: Secondary | ICD-10-CM | POA: Diagnosis not present

## 2021-11-02 LAB — CBC
HCT: 34.6 % — ABNORMAL LOW (ref 36.0–46.0)
Hemoglobin: 10.6 g/dL — ABNORMAL LOW (ref 12.0–15.0)
MCH: 26.1 pg (ref 26.0–34.0)
MCHC: 30.6 g/dL (ref 30.0–36.0)
MCV: 85.2 fL (ref 80.0–100.0)
Platelets: 101 10*3/uL — ABNORMAL LOW (ref 150–400)
RBC: 4.06 MIL/uL (ref 3.87–5.11)
RDW: 15.6 % — ABNORMAL HIGH (ref 11.5–15.5)
WBC: 5.8 10*3/uL (ref 4.0–10.5)
nRBC: 0 % (ref 0.0–0.2)

## 2021-11-02 LAB — COMPREHENSIVE METABOLIC PANEL
ALT: 11 U/L (ref 0–44)
AST: 19 U/L (ref 15–41)
Albumin: 2.4 g/dL — ABNORMAL LOW (ref 3.5–5.0)
Alkaline Phosphatase: 60 U/L (ref 38–126)
Anion gap: 6 (ref 5–15)
BUN: 34 mg/dL — ABNORMAL HIGH (ref 8–23)
CO2: 18 mmol/L — ABNORMAL LOW (ref 22–32)
Calcium: 8.6 mg/dL — ABNORMAL LOW (ref 8.9–10.3)
Chloride: 117 mmol/L — ABNORMAL HIGH (ref 98–111)
Creatinine, Ser: 1.47 mg/dL — ABNORMAL HIGH (ref 0.44–1.00)
GFR, Estimated: 35 mL/min — ABNORMAL LOW (ref >60)
Glucose, Bld: 83 mg/dL (ref 70–99)
Potassium: 4.2 mmol/L (ref 3.5–5.1)
Sodium: 141 mmol/L (ref 135–145)
Total Bilirubin: 0.7 mg/dL (ref 0.3–1.2)
Total Protein: 5 g/dL — ABNORMAL LOW (ref 6.5–8.1)

## 2021-11-02 NOTE — Progress Notes (Signed)
     [facility] Progress Note  CC:  Abdominal pain  Assessment  and Plan: Postprandial abdominal pain, 40 pound unintentional weight loss, and sitophobia. History of pancreatitis in 2013 attributed to Lamisil.  Recent diagnosis of stomach infection (? H pylori) but did not tolerate treatment.   Evaluation this admission shows an elevated lipase with a normal appearing pancreas on non-contrast CT except for a cystic lesion in the pancreatic tail that is unchanged if not smaller compared to last year. There were no findings consistent with pancreatitis.   She has a left renal mass worrisome for malignancy that might also explain her symptoms.  However, the differential also includes mesenteric ischemia, PUD, esophagitis, gastritis, and persistent H pylori. There are case reports of renal cell carcinoma presenting with pancreatitis.   Plan: - Contrast-enhanced ultrasound to evaluate the renal mass.  - CTA for further evaluation of possible mesenteric ischemia when her renal function improves - Continue antiemetics - Continue empiric PPI BID - Clear liquids with diet advance tolerated - She may need ultimately upper endoscopy if CTA is negative.   Subjective: No new complaints today. No family was present at the time of my evaluation  Objective:  Vital signs in last 24 hours: Temp:  [97.4 F (36.3 C)-98.2 F (36.8 C)] 98.2 F (36.8 C) (11/27 2109) Pulse Rate:  [87-97] 87 (11/27 2109) Resp:  [15-19] 15 (11/27 2109) BP: (151-176)/(59-76) 176/59 (11/27 2109) SpO2:  [90 %-98 %] 98 % (11/27 2109)   General:   Frail elderly woman who looks slightly older than her stated age Abdomen:  Soft. Nontender. Nondistended. Normal bowel sounds. No rebound or guarding. Neurologic:  Alert and  oriented x4;  grossly normal neurologically. Psych:  Alert and cooperative. Normal mood and affect.   Lab Results: Recent Labs    10/31/21 0955 11/01/21 0159 11/02/21 0122  WBC 7.3 6.1  5.8  HGB 13.3 11.1* 10.6*  HCT 42.4 34.8* 34.6*  PLT 134* 112* 101*   BMET Recent Labs    10/31/21 0955 11/01/21 0159 11/02/21 0122  NA 141 138 141  K 4.5 4.0 4.2  CL 114* 114* 117*  CO2 20* 18* 18*  GLUCOSE 83 77 83  BUN 47* 41* 34*  CREATININE 1.74* 1.47* 1.47*  CALCIUM 9.6 8.6* 8.6*   LFT Recent Labs    11/02/21 0122  PROT 5.0*  ALBUMIN 2.4*  AST 19  ALT 11  ALKPHOS 60  BILITOT 0.7      LOS: 1 day   [place]  11/02/2021, 9:32 PM

## 2021-11-02 NOTE — Progress Notes (Signed)
PROGRESS NOTE    Linda [name]  CNO:[id number] DOB: 12-28-1935 DOA: 10/31/2021 PCP: Sonia Side., FNP  Brief Narrative: 85/F with remote history of breast cancer, hypertension, dyslipidemia, peripheral vascular disease,?  Left renal mass, who presented to the ED with lower abdominal pain and discomfort for a few weeks. -In addition also has had heartburn with bitter taste in her mouth, pain is worse after eating, has been eating and drinking less, has lost a lot of weight. -Saw her PCP recently and was started on medication for infection in her throat and stomach ?  Thrush vs Hpylori, after which she developed diarrhea -In the emergency room she was noted to have a lipase of 378, creatinine of 1.7, CT abdomen noted left upper pole renal mass slightly larger than 2020 and a cystic lesion in the pancreatic tail   Assessment & Plan:   Abdominal pain, weight loss -Seems multifactorial, may have a component of chronic pancreatitis , GERD/PUD ? Mesenteric ischemia -Continue IV PPI, Carafate, lipase >800 yesterday -Continue clears today, IV fluids -Gastroenterology consulting -CTA abdomen being considered to rule out chronic mesenteric ischemia, creatinine stable at 1.4 which could be her baseline, may need an endoscopy-has considerable symptoms of GERD, ? PUD  Left renal mass -Larger compared to imaging in 2020, concerning for RCC, will send urgent urology referral at [place] [facility] cystic lesion in pancreatic tail -Likely benign  Moderate protein calorie malnutrition -Patient reports significant weight loss in the setting of above -RD consult, add supplements as diet advanced  Acute kidney injury -Baseline creatinine was normal in 2010, 1.7 on admission, recent baseline unknown -Hold ARB, continue IV fluids today -Creatinine stable at 1.4 now  Tobacco use -Counseled  Essential hypertension -Continue verapamil, hydralazine -Cozaar on hold  Remote history of breast  cancer -Stable   DVT prophylaxis: Lovenox Code Status: DNR Family Communication: Discussed with patient in detail, no family at bedside Disposition Plan:  Status is: Inpatient  inpatient due to: Severity of illness  Consultants:  Gastroenterology  Procedures:   Antimicrobials:    Subjective: -Reports ongoing abdominal pain, discomfort for 3 weeks, also symptoms of heartburn and reflux -Reports significant weight loss  Objective: Vitals:   11/01/21 1152 11/01/21 1640 11/01/21 1940 11/02/21 0848  BP: (!) 141/64 (!) 113/57 122/68 (!) 151/76  Pulse: (!) 108 83 82 97  Resp: 16 16 17 19   Temp:  98.9 F (37.2 C) 98.5 F (36.9 C) (!) 97.4 F (36.3 C)  TempSrc:  Oral Oral Oral  SpO2: 96% 94% 96% 90%  Weight:      Height:        Intake/Output Summary (Last 24 hours) at 11/02/2021 1136 Last data filed at 11/02/2021 0300 Gross per 24 hour  Intake 1168.56 ml  Output --  Net 1168.56 ml   Filed Weights   10/31/21 0928  Weight: 69.8 kg    Examination:  General exam: Pleasant chronically ill elderly female laying in bed, AAO x3, no distress CVS: S1-S2, regular rate rhythm Lungs: Clear bilaterally Abdomen: Soft, minimal left lower quadrant tenderness and periumbilical fullness, bowel sounds present Extremities: No edema Skin: No rash on exposed skin Psych: Flat affect   Data Reviewed:   CBC: Recent Labs  Lab 10/31/21 0955 11/01/21 0159 11/02/21 0122  WBC 7.3 6.1 5.8  NEUTROABS 4.9  --   --   HGB 13.3 11.1* 10.6*  HCT 42.4 34.8* 34.6*  MCV 85.0 83.7 85.2  PLT 134* 112* 101*   Basic  Metabolic Panel: Recent Labs  Lab 10/31/21 0955 11/01/21 0159 11/02/21 0122  NA 141 138 141  K 4.5 4.0 4.2  CL 114* 114* 117*  CO2 20* 18* 18*  GLUCOSE 83 77 83  BUN 47* 41* 34*  CREATININE 1.74* 1.47* 1.47*  CALCIUM 9.6 8.6* 8.6*   GFR: Estimated Creatinine Clearance: 25.9 mL/min (A) (by C-G formula based on SCr of 1.47 mg/dL (H)). Liver Function Tests: Recent  Labs  Lab 10/31/21 0955 11/02/21 0122  AST 22 19  ALT 11 11  ALKPHOS 76 60  BILITOT 0.6 0.7  PROT 6.8 5.0*  ALBUMIN 3.2* 2.4*   Recent Labs  Lab 10/31/21 0955 11/01/21 0159  LIPASE 378* 640*   No results for input(s): AMMONIA in the last 168 hours. Coagulation Profile: No results for input(s): INR, PROTIME in the last 168 hours. Cardiac Enzymes: No results for input(s): CKTOTAL, CKMB, CKMBINDEX, TROPONINI in the last 168 hours. BNP (last 3 results) No results for input(s): PROBNP in the last 8760 hours. HbA1C: No results for input(s): HGBA1C in the last 72 hours. CBG: No results for input(s): GLUCAP in the last 168 hours. Lipid Profile: No results for input(s): CHOL, HDL, LDLCALC, TRIG, CHOLHDL, LDLDIRECT in the last 72 hours. Thyroid Function Tests: No results for input(s): TSH, T4TOTAL, FREET4, T3FREE, THYROIDAB in the last 72 hours. Anemia Panel: No results for input(s): VITAMINB12, FOLATE, FERRITIN, TIBC, IRON, RETICCTPCT in the last 72 hours. Urine analysis:    Component Value Date/Time   COLORURINE YELLOW 06/15/2019 1310   APPEARANCEUR CLEAR 06/15/2019 1310   LABSPEC 1.013 06/15/2019 1310   PHURINE 5.0 06/15/2019 1310   GLUCOSEU NEGATIVE 06/15/2019 1310   HGBUR MODERATE (A) 06/15/2019 1310   BILIRUBINUR NEGATIVE 06/15/2019 1310   BILIRUBINUR NEGATIVE 06/14/2019 1232   KETONESUR NEGATIVE 06/15/2019 1310   PROTEINUR 100 (A) 06/15/2019 1310   UROBILINOGEN 0.2 06/14/2019 1232   UROBILINOGEN 1.0 09/27/2014 2044   NITRITE NEGATIVE 06/15/2019 1310   LEUKOCYTESUR LARGE (A) 06/15/2019 1310   Sepsis Labs: @LABRCNTIP (procalcitonin:4,lacticidven:4)  ) Recent Results (from the past 240 hour(s))  Resp Panel by RT-PCR (Flu A&B, Covid) Nasopharyngeal Swab     Status: None   Collection Time: 10/31/21 10:00 AM   Specimen: Nasopharyngeal Swab; Nasopharyngeal(NP) swabs in vial transport medium  Result Value Ref Range Status   SARS Coronavirus 2 by RT PCR NEGATIVE  NEGATIVE Final    Comment: (NOTE) SARS-CoV-2 target nucleic acids are NOT DETECTED.  The SARS-CoV-2 RNA is generally detectable in upper respiratory specimens during the acute phase of infection. The lowest concentration of SARS-CoV-2 viral copies this assay can detect is 138 copies/mL. A negative result does not preclude SARS-Cov-2 infection and should not be used as the sole basis for treatment or other patient management decisions. A negative result may occur with  improper specimen collection/handling, submission of specimen other than nasopharyngeal swab, presence of viral mutation(s) within the areas targeted by this assay, and inadequate number of viral copies(<138 copies/mL). A negative result must be combined with clinical observations, patient history, and epidemiological information. The expected result is Negative.  Fact Sheet for Patients:  EntrepreneurPulse.com.au  Fact Sheet for Healthcare Providers:  IncredibleEmployment.be  This test is no t yet approved or cleared by the [place] FDA and  has been authorized for detection and/or diagnosis of SARS-CoV-2 by FDA under an Emergency Use Authorization (EUA). This EUA will remain  in effect (meaning this test can be used) for the duration of the COVID-19 declaration under  Section 564(b)(1) of the Act, 21 U.S.C.section 360bbb-3(b)(1), unless the authorization is terminated  or revoked sooner.       Influenza A by PCR NEGATIVE NEGATIVE Final   Influenza B by PCR NEGATIVE NEGATIVE Final    Comment: (NOTE) The Xpert Xpress SARS-CoV-2/FLU/RSV plus assay is intended as an aid in the diagnosis of influenza from Nasopharyngeal swab specimens and should not be used as a sole basis for treatment. Nasal washings and aspirates are unacceptable for Xpert Xpress SARS-CoV-2/FLU/RSV testing.  Fact Sheet for Patients: EntrepreneurPulse.com.au  Fact Sheet for Healthcare  Providers: IncredibleEmployment.be  This test is not yet approved or cleared by the [place] FDA and has been authorized for detection and/or diagnosis of SARS-CoV-2 by FDA under an Emergency Use Authorization (EUA). This EUA will remain in effect (meaning this test can be used) for the duration of the COVID-19 declaration under Section 564(b)(1) of the Act, 21 U.S.C. section 360bbb-3(b)(1), unless the authorization is terminated or revoked.  Performed at [facility], [place] [address] [place], [place] [address]     Radiology Studies: CT ABDOMEN PELVIS WO CONTRAST  Result Date: 10/31/2021 CLINICAL DATA:  85 year old female with abdominal pain. EXAM: CT ABDOMEN AND PELVIS WITHOUT CONTRAST TECHNIQUE: Multidetector CT imaging of the abdomen and pelvis was performed following the standard protocol without IV contrast. COMPARISON:  06/15/2019 FINDINGS: Lower chest: No acute abnormality. Hepatobiliary: No focal liver abnormality is seen. Status post cholecystectomy. No biliary dilatation. Pancreas: Similar appearing tubular cystic lesion about the pancreatic tail measuring up to approximately 1.3 cm, similar to comparison. No new pancreatic ductal dilation or focal mass. Spleen: Normal in size without focal abnormality. Adrenals/Urinary Tract: Similar appearing mild left adrenal gland thickening without discrete nodularity. Hip interval enlargement of previously described exophytic left solid renal mass arising from the posterior upper pole which measures up to 2.5 cm, previously 1.9 cm. Similar appearance of scattered simple cysts about the kidneys bilaterally. No hydronephrosis. Bladder is unremarkable. Stomach/Bowel: Stomach is within normal limits. Appendix appears normal. Pan colonic diverticula without surrounding inflammatory changes. No evidence of bowel wall thickening, distention, or inflammatory changes. Vascular/Lymphatic: Aortic atherosclerosis. No enlarged  abdominal or pelvic lymph nodes. Reproductive: Status post hysterectomy. No adnexal masses. Other: Similar appearing small fat containing umbilical hernia. No abdominopelvic ascites. Musculoskeletal: No acute osseous abnormality. Similar appearing multilevel degenerative changes of the visualized thoracolumbar spine. IMPRESSION: 1. No acute abdominopelvic abnormality. 2. Interval enlargement of previously described left upper pole exophytic solid mass from 1.9 cm to 2.5 cm since 2020. This remains suspicious for primary renal neoplasm. Further evaluation with contrast-enhanced ultrasound would be reasonable given intolerance to intravenous contrast agents. 3. Diverticulosis without evidence of diverticulitis. 4. Indeterminate similar appearing slightly decreased size of ovoid cystic lesion in the pancreatic tail, statistically favored represent a benign IPMN. Electronically Signed   By: Ruthann Cancer M.D.   On: 10/31/2021 14:34     Scheduled Meds:  aspirin EC  81 mg Oral Daily   atorvastatin  40 mg Oral QPM   docusate sodium  100 mg Oral BID   enoxaparin (LOVENOX) injection  30 mg Subcutaneous Q24H   gabapentin  300 mg Oral QHS   hydrALAZINE  50 mg Oral TID   mirtazapine  15 mg Oral QHS   nicotine  14 mg Transdermal Q24H   pantoprazole (PROTONIX) IV  40 mg Intravenous Q24H   sucralfate  1 g Oral TID WC & HS   verapamil  240 mg Oral q AM  Continuous Infusions:  lactated ringers 75 mL/hr at 11/01/21 1657     LOS: 1 day    Time spent: 74min  Domenic Polite, MD [facility]   11/02/2021, 11:36 AM

## 2021-11-02 NOTE — Plan of Care (Signed)

## 2021-11-03 DIAGNOSIS — N2889 Other specified disorders of kidney and ureter: Secondary | ICD-10-CM

## 2021-11-03 DIAGNOSIS — E440 Moderate protein-calorie malnutrition: Secondary | ICD-10-CM | POA: Diagnosis not present

## 2021-11-03 DIAGNOSIS — N179 Acute kidney failure, unspecified: Secondary | ICD-10-CM | POA: Diagnosis not present

## 2021-11-03 DIAGNOSIS — R638 Other symptoms and signs concerning food and fluid intake: Secondary | ICD-10-CM | POA: Diagnosis not present

## 2021-11-03 DIAGNOSIS — R1013 Epigastric pain: Secondary | ICD-10-CM | POA: Diagnosis not present

## 2021-11-03 DIAGNOSIS — R1010 Upper abdominal pain, unspecified: Secondary | ICD-10-CM | POA: Diagnosis not present

## 2021-11-03 LAB — COMPREHENSIVE METABOLIC PANEL
ALT: 10 U/L (ref 0–44)
AST: 20 U/L (ref 15–41)
Albumin: 2.5 g/dL — ABNORMAL LOW (ref 3.5–5.0)
Alkaline Phosphatase: 63 U/L (ref 38–126)
Anion gap: 4 — ABNORMAL LOW (ref 5–15)
BUN: 23 mg/dL (ref 8–23)
CO2: 22 mmol/L (ref 22–32)
Calcium: 8.7 mg/dL — ABNORMAL LOW (ref 8.9–10.3)
Chloride: 109 mmol/L (ref 98–111)
Creatinine, Ser: 1.34 mg/dL — ABNORMAL HIGH (ref 0.44–1.00)
GFR, Estimated: 39 mL/min — ABNORMAL LOW (ref >60)
Glucose, Bld: 93 mg/dL (ref 70–99)
Potassium: 4.4 mmol/L (ref 3.5–5.1)
Sodium: 135 mmol/L (ref 135–145)
Total Bilirubin: 0.4 mg/dL (ref 0.3–1.2)
Total Protein: 5.3 g/dL — ABNORMAL LOW (ref 6.5–8.1)

## 2021-11-03 LAB — CBC
HCT: 36.2 % (ref 36.0–46.0)
Hemoglobin: 11.4 g/dL — ABNORMAL LOW (ref 12.0–15.0)
MCH: 26.3 pg (ref 26.0–34.0)
MCHC: 31.5 g/dL (ref 30.0–36.0)
MCV: 83.6 fL (ref 80.0–100.0)
Platelets: 111 10*3/uL — ABNORMAL LOW (ref 150–400)
RBC: 4.33 MIL/uL (ref 3.87–5.11)
RDW: 15.4 % (ref 11.5–15.5)
WBC: 5.7 10*3/uL (ref 4.0–10.5)
nRBC: 0 % (ref 0.0–0.2)

## 2021-11-03 LAB — LIPASE, BLOOD: Lipase: 480 U/L — ABNORMAL HIGH (ref 11–51)

## 2021-11-03 MED ORDER — SENNOSIDES-DOCUSATE SODIUM 8.6-50 MG PO TABS
1.0000 | ORAL_TABLET | Freq: Two times a day (BID) | ORAL | Status: DC
  Administered 2021-11-03 – 2021-11-05 (×5): 1 via ORAL
  Filled 2021-11-03 (×8): qty 1

## 2021-11-03 MED ORDER — ADULT MULTIVITAMIN W/MINERALS CH
1.0000 | ORAL_TABLET | Freq: Every day | ORAL | Status: DC
  Administered 2021-11-03 – 2021-11-05 (×3): 1 via ORAL
  Filled 2021-11-03 (×5): qty 1

## 2021-11-03 MED ORDER — SODIUM CHLORIDE 0.9 % IV SOLN: INTRAVENOUS | Status: AC

## 2021-11-03 MED ORDER — DICYCLOMINE HCL 10 MG PO CAPS
10.0000 mg | ORAL_CAPSULE | Freq: Two times a day (BID) | ORAL | Status: DC
  Administered 2021-11-03 – 2021-11-05 (×5): 10 mg via ORAL
  Filled 2021-11-03 (×12): qty 1

## 2021-11-03 MED ORDER — BOOST / RESOURCE BREEZE PO LIQD CUSTOM
1.0000 | Freq: Three times a day (TID) | ORAL | Status: DC
  Administered 2021-11-03 – 2021-11-05 (×5): 1 via ORAL
  Filled 2021-11-03 (×4): qty 1

## 2021-11-03 MED ORDER — PANTOPRAZOLE SODIUM 40 MG PO TBEC
40.0000 mg | DELAYED_RELEASE_TABLET | Freq: Two times a day (BID) | ORAL | Status: DC
  Administered 2021-11-03 – 2021-11-05 (×5): 40 mg via ORAL
  Filled 2021-11-03 (×6): qty 1

## 2021-11-03 NOTE — Progress Notes (Signed)
PROGRESS NOTE    Linda [name]  DDU:[id number] DOB: 19-Aug-1936 DOA: 10/31/2021 PCP: Sonia Side., FNP  Brief Narrative: 85/F with remote history of breast cancer, hypertension, dyslipidemia, peripheral vascular disease,?  Left renal mass, who presented to the ED with lower abdominal pain and discomfort for a few weeks. -In addition also has had heartburn with bitter taste in her mouth, pain is worse after eating, has been eating and drinking less, has lost a lot of weight. -Saw her PCP recently and was started on medication for infection in her throat and stomach ?  Thrush vs Hpylori, after which she developed diarrhea -In the emergency room she was noted to have a lipase of 378, creatinine of 1.7, CT abdomen noted left upper pole renal mass slightly larger than 2020 and a cystic lesion in the pancreatic tail   Assessment & Plan:   Abdominal pain, weight loss -Seems multifactorial, may have a component of chronic pancreatitis , GERD/PUD ? Mesenteric ischemia -Continue IV PPI, Carafate, lipase > 600 on 11/26, now improving -Treated with clears, fluids, supportive care -Gastroenterology consulting -CTA abdomen being considered to rule out chronic mesenteric ischemia, creatinine at 1.34 today, GFR slightly better, unclear if numbers will improve considerably to allow for safe CTA abdomen -Anticipate need for endoscopy -has considerable symptoms of GERD, ? PUD -Add dicyclomine  Left renal mass -Larger compared to imaging in 2020, concerning for RCC, will send urgent urology referral at [facility], do not have equipment to perform ultrasound with bubble contrast medium until early next year -She is not having flank pain, did not think her abdominal symptoms are secondary to this  Indeterminant cystic lesion in pancreatic tail -Likely benign  Moderate protein calorie malnutrition -Patient reports significant weight loss in the setting of above -RD consult,  supplements as  diet advanced  Acute kidney injury -Baseline creatinine was normal in 2010, 1.7 on admission, recent baseline unknown -Hold ARB, continue IV fluids today -Creatinine stable at 1.34 now  Tobacco use -Counseled  Essential hypertension -Continue verapamil, hydralazine -Cozaar on hold  Remote history of breast cancer -Stable   DVT prophylaxis: Lovenox Code Status: DNR Family Communication: Discussed with patient in detail, no family at bedside Disposition Plan:  Status is: Inpatient  inpatient due to: Severity of illness  Consultants:  Gastroenterology  Procedures:   Antimicrobials:    Subjective: -Continues to be bothered by lower abdominal pain, denies any flank discomfort, complains of heartburn as well  Objective: Vitals:   11/02/21 2109 11/03/21 0443 11/03/21 0658 11/03/21 0752  BP: (!) 176/59 (!) 190/75 (!) 164/68 (!) 170/75  Pulse: 87 92  91  Resp: 15 17  17   Temp: 98.2 F (36.8 C) 98.1 F (36.7 C)  98 F (36.7 C)  TempSrc:    Oral  SpO2: 98% 100%  100%  Weight:      Height:       No intake or output data in the 24 hours ending 11/03/21 1203  Filed Weights   10/31/21 0928  Weight: 69.8 kg    Examination:  General exam: Chronically ill elderly female laying in bed, AAOx3, uncomfortable appearing CVS: S1-S2, regular rate rhythm Lungs: Decreased breath sounds to bases Abdomen: Soft, mild lower quadrant tenderness, bowel sounds present Extremities: No edema  Skin: No rash on exposed skin Psych: Flat affect   Data Reviewed:   CBC: Recent Labs  Lab 10/31/21 0955 11/01/21 0159 11/02/21 0122 11/03/21 0317  WBC 7.3 6.1 5.8 5.7  NEUTROABS 4.9  --   --   --   HGB 13.3 11.1* 10.6* 11.4*  HCT 42.4 34.8* 34.6* 36.2  MCV 85.0 83.7 85.2 83.6  PLT 134* 112* 101* 263*   Basic Metabolic Panel: Recent Labs  Lab 10/31/21 0955 11/01/21 0159 11/02/21 0122 11/03/21 0317  NA 141 138 141 135  K 4.5 4.0 4.2 4.4  CL 114* 114* 117* 109  CO2 20* 18*  18* 22  GLUCOSE 83 77 83 93  BUN 47* 41* 34* 23  CREATININE 1.74* 1.47* 1.47* 1.34*  CALCIUM 9.6 8.6* 8.6* 8.7*   GFR: Estimated Creatinine Clearance: 28.4 mL/min (A) (by C-G formula based on SCr of 1.34 mg/dL (H)). Liver Function Tests: Recent Labs  Lab 10/31/21 0955 11/02/21 0122 11/03/21 0317  AST 22 19 20   ALT 11 11 10   ALKPHOS 76 60 63  BILITOT 0.6 0.7 0.4  PROT 6.8 5.0* 5.3*  ALBUMIN 3.2* 2.4* 2.5*   Recent Labs  Lab 10/31/21 0955 11/01/21 0159 11/03/21 0317  LIPASE 378* 640* 480*   No results for input(s): AMMONIA in the last 168 hours. Coagulation Profile: No results for input(s): INR, PROTIME in the last 168 hours. Cardiac Enzymes: No results for input(s): CKTOTAL, CKMB, CKMBINDEX, TROPONINI in the last 168 hours. BNP (last 3 results) No results for input(s): PROBNP in the last 8760 hours. HbA1C: No results for input(s): HGBA1C in the last 72 hours. CBG: No results for input(s): GLUCAP in the last 168 hours. Lipid Profile: No results for input(s): CHOL, HDL, LDLCALC, TRIG, CHOLHDL, LDLDIRECT in the last 72 hours. Thyroid Function Tests: No results for input(s): TSH, T4TOTAL, FREET4, T3FREE, THYROIDAB in the last 72 hours. Anemia Panel: No results for input(s): VITAMINB12, FOLATE, FERRITIN, TIBC, IRON, RETICCTPCT in the last 72 hours. Urine analysis:    Component Value Date/Time   COLORURINE YELLOW 06/15/2019 1310   APPEARANCEUR CLEAR 06/15/2019 1310   LABSPEC 1.013 06/15/2019 1310   PHURINE 5.0 06/15/2019 1310   GLUCOSEU NEGATIVE 06/15/2019 1310   HGBUR MODERATE (A) 06/15/2019 1310   BILIRUBINUR NEGATIVE 06/15/2019 1310   BILIRUBINUR NEGATIVE 06/14/2019 1232   KETONESUR NEGATIVE 06/15/2019 1310   PROTEINUR 100 (A) 06/15/2019 1310   UROBILINOGEN 0.2 06/14/2019 1232   UROBILINOGEN 1.0 09/27/2014 2044   NITRITE NEGATIVE 06/15/2019 1310   LEUKOCYTESUR LARGE (A) 06/15/2019 1310   Sepsis Labs: @LABRCNTIP (procalcitonin:4,lacticidven:4)  ) Recent  Results (from the past 240 hour(s))  Resp Panel by RT-PCR (Flu A&B, Covid) Nasopharyngeal Swab     Status: None   Collection Time: 10/31/21 10:00 AM   Specimen: Nasopharyngeal Swab; Nasopharyngeal(NP) swabs in vial transport medium  Result Value Ref Range Status   SARS Coronavirus 2 by RT PCR NEGATIVE NEGATIVE Final    Comment: (NOTE) SARS-CoV-2 target nucleic acids are NOT DETECTED.  The SARS-CoV-2 RNA is generally detectable in upper respiratory specimens during the acute phase of infection. The lowest concentration of SARS-CoV-2 viral copies this assay can detect is 138 copies/mL. A negative result does not preclude SARS-Cov-2 infection and should not be used as the sole basis for treatment or other patient management decisions. A negative result may occur with  improper specimen collection/handling, submission of specimen other than nasopharyngeal swab, presence of viral mutation(s) within the areas targeted by this assay, and inadequate number of viral copies(<138 copies/mL). A negative result must be combined with clinical observations, patient history, and epidemiological information. The expected result is Negative.  Fact Sheet for Patients:  EntrepreneurPulse.com.au  Fact Sheet for  Healthcare Providers:  IncredibleEmployment.be  This test is no t yet approved or cleared by the [place] and  has been authorized for detection and/or diagnosis of SARS-CoV-2 by FDA under an Emergency Use Authorization (EUA). This EUA will remain  in effect (meaning this test can be used) for the duration of the COVID-19 declaration under Section 564(b)(1) of the Act, 21 U.S.C.section 360bbb-3(b)(1), unless the authorization is terminated  or revoked sooner.       Influenza A by PCR NEGATIVE NEGATIVE Final   Influenza B by PCR NEGATIVE NEGATIVE Final    Comment: (NOTE) The Xpert Xpress SARS-CoV-2/FLU/RSV plus assay is intended as an aid in the  diagnosis of influenza from Nasopharyngeal swab specimens and should not be used as a sole basis for treatment. Nasal washings and aspirates are unacceptable for Xpert Xpress SARS-CoV-2/FLU/RSV testing.  Fact Sheet for Patients: EntrepreneurPulse.com.au  Fact Sheet for Healthcare Providers: IncredibleEmployment.be  This test is not yet approved or cleared by the [place] FDA and has been authorized for detection and/or diagnosis of SARS-CoV-2 by FDA under an Emergency Use Authorization (EUA). This EUA will remain in effect (meaning this test can be used) for the duration of the COVID-19 declaration under Section 564(b)(1) of the Act, 21 U.S.C. section 360bbb-3(b)(1), unless the authorization is terminated or revoked.  Performed at [facility], [place] [address], [place], [place] [address]     Radiology Studies: No results found.   Scheduled Meds:  aspirin EC  81 mg Oral Daily   atorvastatin  40 mg Oral QPM   docusate sodium  100 mg Oral BID   enoxaparin (LOVENOX) injection  30 mg Subcutaneous Q24H   feeding supplement  1 Container Oral TID BM   gabapentin  300 mg Oral QHS   hydrALAZINE  50 mg Oral TID   mirtazapine  15 mg Oral QHS   multivitamin with minerals  1 tablet Oral Daily   nicotine  14 mg Transdermal Q24H   pantoprazole  40 mg Oral BID   sucralfate  1 g Oral TID WC & HS   verapamil  240 mg Oral q AM   Continuous Infusions:     LOS: 2 days    Time spent: 76min  Domenic Polite, MD [facility]   11/03/2021, 12:03 PM

## 2021-11-03 NOTE — Progress Notes (Signed)
Pt refused all night time scheduled meds. Convinced pt to take hydralazine BP was 175/59. Pt agreed to take BP med but nothing else. States that she will like to have diet changed to solid food before she takes certain medications.Provider [place] aware.

## 2021-11-03 NOTE — Progress Notes (Addendum)
Attending physician's note   I have taken an interval history, reviewed the chart and examined the patient. I agree with the Advanced Practitioner's note, impression, and recommendations as outlined.   Lipase downtrending at 480 from peak of 640.  Liver enzymes normal including normal ALP and T bili.  Renal function improving with BUN/creatinine 23/1.3 (from 34/1.5 yesterday).  Normal WBC.  Agree that her symptomatology seems curious for vascular etiology given relationship with p.o. intake (abdominal pain approximately 30 minutes after eating; relative lack of pain when fasting/n.p.o.).  Plan for CTA when renal function allows.  EGD if unrevealing, EGD would be reasonable to evaluate for PUD, gastritis, H. pylori, etc.  Gerrit Heck, DO, FACG [phone] office                Daily Rounding Note  11/03/2021, 10:14 AM  LOS: 2 days   SUBJECTIVE:   Chief complaint:   Abdominal pain.  Elevated lipase. Pain in the periumbilical area continues.  No nausea.  Even with full liquids, p.o. intake increases abdominal pain.  No nausea or vomiting.  Last bowel movement was 3 days ago.  Normally has 2 or 3 bowel movements a week.  OBJECTIVE:         Vital signs in last 24 hours:    Temp:  [98 F (36.7 C)-98.2 F (36.8 C)] 98 F (36.7 C) (11/28 0752) Pulse Rate:  [87-92] 91 (11/28 0752) Resp:  [15-17] 17 (11/28 0752) BP: (164-190)/(59-75) 170/75 (11/28 0752) SpO2:  [98 %-100 %] 100 % (11/28 0752)   Filed Weights   10/31/21 0928  Weight: 69.8 kg   General: Looks frail, pleasant but somewhat uncomfortable. Heart: RRR. Chest: No labored breathing or cough. Abdomen: Soft.  Tender in the mid abdomen without guarding or rebound. Extremities: No CCE. Neuro/Psych: Oriented x3.  No gross deficits or weakness.  Fluid speech.  Intake/Output from previous day: No intake/output data recorded.  Intake/Output this shift: No  intake/output data recorded.  Lab Results: Recent Labs    11/01/21 0159 11/02/21 0122 11/03/21 0317  WBC 6.1 5.8 5.7  HGB 11.1* 10.6* 11.4*  HCT 34.8* 34.6* 36.2  PLT 112* 101* 111*   BMET Recent Labs    11/01/21 0159 11/02/21 0122 11/03/21 0317  NA 138 141 135  K 4.0 4.2 4.4  CL 114* 117* 109  CO2 18* 18* 22  GLUCOSE 77 83 93  BUN 41* 34* 23  CREATININE 1.47* 1.47* 1.34*  CALCIUM 8.6* 8.6* 8.7*   LFT Recent Labs    11/02/21 0122 11/03/21 0317  PROT 5.0* 5.3*  ALBUMIN 2.4* 2.5*  AST 19 20  ALT 11 10  ALKPHOS 60 63  BILITOT 0.7 0.4   PT/INR No results for input(s): LABPROT, INR in the last 72 hours. Hepatitis Panel No results for input(s): HEPBSAG, HCVAB, HEPAIGM, HEPBIGM in the last 72 hours.  Studies/Results: No results found.  Scheduled Meds:  aspirin EC  81 mg Oral Daily   atorvastatin  40 mg Oral QPM   docusate sodium  100 mg Oral BID   enoxaparin (LOVENOX) injection  30 mg Subcutaneous Q24H   gabapentin  300 mg Oral QHS   hydrALAZINE  50 mg Oral TID   mirtazapine  15 mg Oral QHS   nicotine  14 mg Transdermal Q24H   pantoprazole (PROTONIX) IV  40 mg Intravenous Q24H   sucralfate  1 g Oral TID WC & HS   verapamil  240 mg Oral q AM  Continuous Infusions: PRN Meds:.acetaminophen **OR** acetaminophen, albuterol, bisacodyl, fentaNYL (SUBLIMAZE) injection, hydrALAZINE, ondansetron **OR** ondansetron (ZOFRAN) IV, oxyCODONE, polyethylene glycol   ASSESMENT:   Abdominal pain, worse post prandial.  Elevated lipase but noncon CT does not confirm pancreatitis.  2013 pancreatitis attributed to Lamisil.  Prior cholecystectomy. Recent Dx "stomach infection" (H. Pylori?),  completed abs w/O improvement.  Noncontrast CTAP w slight decrease in indeterminant cystic lesion at pancreatic tail, statistically favors IPNM.  Bile ducts unremarkable.  Lipase 378 ... 640 ... 480.  LFTs normal.     Left renal mass, concerning for cancer.  Had been evaluated earlier this  year by a specialist.     AKI improving.  GFR 29... 39.     PLAN      CTA, r/O mesenteric ischemia, when renal function allows.  Continue empiric bid Protonix, switch to oral form today.  Continue Carafate, prn analgesia.    Azucena Freed  11/03/2021, 10:14 AM Phone [phone]

## 2021-11-03 NOTE — Progress Notes (Signed)
Initial Nutrition Assessment  DOCUMENTATION CODES:  Non-severe (moderate) malnutrition in context of chronic illness  INTERVENTION:  Continue to advance diet as medically able and as tolerated.  Obtain updated weight.  Add Boost Breeze po TID, each supplement provides 250 kcal and 9 grams of protein.  Add MVI with minerals daily.  Encourage PO and supplement intake.  NUTRITION DIAGNOSIS:  Moderate Malnutrition related to chronic illness (PAD) as evidenced by mild fat depletion, moderate muscle depletion.  GOAL:  Patient will meet greater than or equal to 90% of their needs  MONITOR:  Diet advancement, PO intake, Supplement acceptance, Labs, I & O's  REASON FOR ASSESSMENT:  Consult Assessment of nutrition requirement/status  ASSESSMENT:  85 yo female with a PMH of  breast cancer, hypertension, dyslipidemia, peripheral vascular disease, ? L renal mass, who presented to the ED with lower abdominal pain and discomfort for a few weeks. Admitted with abdominal pain.  Attempted to speak with pt at bedside. Pt very lethargic and did not provide too much information other than being sensitive to lactose and not liking the milky Ensure/Boost products.  Per MD notes, pt reports a lot of weight loss recently and decreased PO intake.  Per Epic, pt has very limited PO intake documentation. For breakfast on 11/26, pt ate 25%.  Per Epic, pt's weight appears to be copied from 16 months prior. RD to order new measured weight to determine changes.  Recommend adding Boost Breeze TID and MVI with minerals daily. Continue to advance diet.  Added "Suggestions for Increasing Calories and Protein" handout from the Academy of [employer] Dietetics to patient's discharge instructions.  Medications: reviewed; colace BID, Remeron, Protonix BID, sucralfate QID  Labs: reviewed  NUTRITION - FOCUSED PHYSICAL EXAM: Flowsheet Row Most Recent Value  Orbital Region Moderate depletion  Upper Arm  Region Severe depletion  Thoracic and Lumbar Region Mild depletion  Buccal Region Mild depletion  Temple Region Moderate depletion  Clavicle Bone Region Mild depletion  Clavicle and Acromion Bone Region Severe depletion  Scapular Bone Region Moderate depletion  Dorsal Hand Mild depletion  Patellar Region Moderate depletion  Anterior Thigh Region Moderate depletion  Posterior Calf Region Moderate depletion  Edema (RD Assessment) None  Hair Reviewed  Eyes Reviewed  Mouth Reviewed  Skin Reviewed  Nails Reviewed   Diet Order:   Diet Order             Diet full liquid Room service appropriate? Yes; Fluid consistency: Thin  Diet effective now                  EDUCATION NEEDS:  Education needs have been addressed  Skin:  Skin Assessment: Reviewed RN Assessment  Last BM:  no BM documented  Height:  Ht Readings from Last 1 Encounters:  10/31/21 5\' 1"  (1.549 m)   Weight:  Wt Readings from Last 1 Encounters:  10/31/21 69.8 kg   BMI:  Body mass index is 29.08 kg/m.  Estimated Nutritional Needs:  Kcal:  1650-1850 Protein:  85-100 grams Fluid:  >1.65 L  Derrel Nip, RD, LDN (she/her/hers) Clinical Inpatient Dietitian RD Pager/After-Hours/Weekend Pager # in [place]

## 2021-11-03 NOTE — Discharge Instructions (Signed)

## 2021-11-04 DIAGNOSIS — E440 Moderate protein-calorie malnutrition: Secondary | ICD-10-CM | POA: Diagnosis not present

## 2021-11-04 DIAGNOSIS — R1010 Upper abdominal pain, unspecified: Secondary | ICD-10-CM | POA: Diagnosis not present

## 2021-11-04 DIAGNOSIS — R1013 Epigastric pain: Secondary | ICD-10-CM | POA: Diagnosis not present

## 2021-11-04 DIAGNOSIS — N179 Acute kidney failure, unspecified: Secondary | ICD-10-CM | POA: Diagnosis not present

## 2021-11-04 DIAGNOSIS — R638 Other symptoms and signs concerning food and fluid intake: Secondary | ICD-10-CM | POA: Diagnosis not present

## 2021-11-04 LAB — COMPREHENSIVE METABOLIC PANEL
ALT: 11 U/L (ref 0–44)
AST: 18 U/L (ref 15–41)
Albumin: 2.6 g/dL — ABNORMAL LOW (ref 3.5–5.0)
Alkaline Phosphatase: 68 U/L (ref 38–126)
Anion gap: 5 (ref 5–15)
BUN: 15 mg/dL (ref 8–23)
CO2: 22 mmol/L (ref 22–32)
Calcium: 8.8 mg/dL — ABNORMAL LOW (ref 8.9–10.3)
Chloride: 110 mmol/L (ref 98–111)
Creatinine, Ser: 1.26 mg/dL — ABNORMAL HIGH (ref 0.44–1.00)
GFR, Estimated: 42 mL/min — ABNORMAL LOW (ref >60)
Glucose, Bld: 85 mg/dL (ref 70–99)
Potassium: 4 mmol/L (ref 3.5–5.1)
Sodium: 137 mmol/L (ref 135–145)
Total Bilirubin: 0.8 mg/dL (ref 0.3–1.2)
Total Protein: 5.3 g/dL — ABNORMAL LOW (ref 6.5–8.1)

## 2021-11-04 LAB — CBC
HCT: 34.5 % — ABNORMAL LOW (ref 36.0–46.0)
Hemoglobin: 11.1 g/dL — ABNORMAL LOW (ref 12.0–15.0)
MCH: 26.6 pg (ref 26.0–34.0)
MCHC: 32.2 g/dL (ref 30.0–36.0)
MCV: 82.5 fL (ref 80.0–100.0)
Platelets: 117 10*3/uL — ABNORMAL LOW (ref 150–400)
RBC: 4.18 MIL/uL (ref 3.87–5.11)
RDW: 15.2 % (ref 11.5–15.5)
WBC: 5.3 10*3/uL (ref 4.0–10.5)
nRBC: 0 % (ref 0.0–0.2)

## 2021-11-04 MED ORDER — AMLODIPINE BESYLATE 10 MG PO TABS
10.0000 mg | ORAL_TABLET | Freq: Every day | ORAL | Status: DC
  Administered 2021-11-04 – 2021-11-05 (×2): 10 mg via ORAL
  Filled 2021-11-04 (×3): qty 1

## 2021-11-04 MED ORDER — SODIUM CHLORIDE 0.9 % IV SOLN: INTRAVENOUS | Status: AC

## 2021-11-04 MED ORDER — SODIUM CHLORIDE 0.9 % IV SOLN: INTRAVENOUS | Status: DC

## 2021-11-04 MED ORDER — ENOXAPARIN SODIUM 40 MG/0.4ML IJ SOSY
40.0000 mg | PREFILLED_SYRINGE | INTRAMUSCULAR | Status: DC
  Administered 2021-11-04: 40 mg via SUBCUTANEOUS

## 2021-11-04 MED ORDER — ENOXAPARIN SODIUM 40 MG/0.4ML IJ SOSY
PREFILLED_SYRINGE | INTRAMUSCULAR | Status: AC
  Filled 2021-11-04: qty 0.4

## 2021-11-04 MED ORDER — ENOXAPARIN SODIUM 40 MG/0.4ML IJ SOSY
40.0000 mg | PREFILLED_SYRINGE | INTRAMUSCULAR | Status: DC
  Administered 2021-11-05: 40 mg via SUBCUTANEOUS
  Filled 2021-11-04: qty 0.4

## 2021-11-04 MED ORDER — AMLODIPINE BESYLATE 10 MG PO TABS
ORAL_TABLET | ORAL | Status: AC
  Filled 2021-11-04: qty 1

## 2021-11-04 NOTE — H&P (View-Only) (Signed)
Attending physician's note   I have taken an interval history, reviewed the chart and examined the patient. I agree with the Advanced Practitioner's note, impression, and recommendations as outlined.   Renal function slowly improving with BUN/creatinine 15/1.26 (from 1.34 yesterday) and GFR 42.  Continues to have epigastric pain and decreased appetite.  - Plan for EGD tomorrow to evaluate for gastritis, PUD, etc. - If EGD unrevealing, tentative plan for CT angiography when renal function allows (per discussion between Hospitalist and radiology, prefer GFR > 45 for CTA) - N.p.o. at midnight for EGD tomorrow - Continue Protonix, Bentyl, Carafate as prescribed  The indications, risks, and benefits of EGD were explained to the patient in detail. Risks include but are not limited to bleeding, perforation, adverse reaction to medications, and cardiopulmonary compromise. Sequelae include but are not limited to the possibility of surgery, hospitalization, and mortality. The patient verbalized understanding and wished to proceed. All questions answered. Further recommendations pending results of the exam.    Gerrit Heck, DO, FACG [phone] office                Daily Rounding Note  11/04/2021, 11:33 AM  LOS: 3 days   SUBJECTIVE:   Chief complaint:    Abdominal pain, elevated lipase.  Abdominal pain is not any better.  She is not tender to palpation.  No nausea or vomiting.  Anorexia persists.  Last bowel movement was 3 to 4 days ago.  OBJECTIVE:         Vital signs in last 24 hours:    Temp:  [98.1 F (36.7 C)-98.4 F (36.9 C)] 98.4 F (36.9 C) (11/29 0828) Pulse Rate:  [87-96] 94 (11/29 0828) Resp:  [13-20] 20 (11/29 0828) BP: (152-191)/(58-66) 155/59 (11/29 0828) SpO2:  [97 %-100 %] 97 % (11/29 0828) Last BM Date: 10/31/21 Filed Weights   10/31/21 0928  Weight: 69.8 kg   General: Pleasant, resting comfortably on  the bed.  Alert.  Looks somewhat frail but not acutely ill. Heart: RRR. Chest: No labored breathing or cough. Abdomen: Not tender.  Soft.  Bowel sounds normal, active. Extremities: No CCE. Neuro/Psych: Pleasant, calm, cooperative.  Oriented x3.  Intake/Output from previous day: 11/28 0701 - 11/29 0700 In: 639.7 [P.O.:200; I.V.:439.7] Out: -   Intake/Output this shift: No intake/output data recorded.  Lab Results: Recent Labs    11/02/21 0122 11/03/21 0317 11/04/21 0210  WBC 5.8 5.7 5.3  HGB 10.6* 11.4* 11.1*  HCT 34.6* 36.2 34.5*  PLT 101* 111* 117*   BMET Recent Labs    11/02/21 0122 11/03/21 0317 11/04/21 0210  NA 141 135 137  K 4.2 4.4 4.0  CL 117* 109 110  CO2 18* 22 22  GLUCOSE 83 93 85  BUN 34* 23 15  CREATININE 1.47* 1.34* 1.26*  CALCIUM 8.6* 8.7* 8.8*   LFT Recent Labs    11/02/21 0122 11/03/21 0317 11/04/21 0210  PROT 5.0* 5.3* 5.3*  ALBUMIN 2.4* 2.5* 2.6*  AST 19 20 18   ALT 11 10 11   ALKPHOS 60 63 68  BILITOT 0.7 0.4 0.8   PT/INR No results for input(s): LABPROT, INR in the last 72 hours. Hepatitis Panel No results for input(s): HEPBSAG, HCVAB, HEPAIGM, HEPBIGM in the last 72 hours.  Studies/Results: No results found.  Scheduled Meds:  amLODipine  10 mg Oral Daily   aspirin EC  81 mg Oral Daily   atorvastatin  40 mg Oral QPM   dicyclomine  10 mg Oral  BID   enoxaparin (LOVENOX) injection  40 mg Subcutaneous Q24H   feeding supplement  1 Container Oral TID BM   gabapentin  300 mg Oral QHS   hydrALAZINE  50 mg Oral TID   mirtazapine  15 mg Oral QHS   multivitamin with minerals  1 tablet Oral Daily   nicotine  14 mg Transdermal Q24H   pantoprazole  40 mg Oral BID   senna-docusate  1 tablet Oral BID   sucralfate  1 g Oral TID WC & HS   verapamil  240 mg Oral q AM   Continuous Infusions: PRN Meds:.acetaminophen **OR** acetaminophen, albuterol, bisacodyl, fentaNYL (SUBLIMAZE) injection, hydrALAZINE, ondansetron **OR** ondansetron  (ZOFRAN) IV, oxyCODONE, polyethylene glycol   ASSESMENT:   Abdominal pain, worse post prandial.  Elevated lipase but no pancreatitis on noncon CT.  2013 pancreatitis attributed to Lamisil.  Prior cholecystectomy. Recent Dx "stomach infection" (H. Pylori?),  completed abs w/O improvement.  Noncontrast CTAP w slight decrease in indeterminant cystic lesion at pancreatic tail, statistically favors IPNM.  Bile ducts unremarkable.  Lipase 378 ... 640 ... 480.  LFTs normal.      Left renal mass, concerning for cancer.  Had been evaluated earlier this year by a specialist.      AKI improving.  GFR 29... 42.    Thrombocytopenia, acute, improving.  117 today.  Spleen normal on CT.     PLAN   When will be safe to perform CTA to rule out mesenteric ischemia.  Continue empiric Protonix 40 mg po bid, Bentyl, Carafate.    Given it may be some days before a CTA is safe to pursue, ?set her up for EGD in the meantime?  Will discuss with Dr. Deniece Ree  11/04/2021, 11:33 AM Phone [phone]

## 2021-11-04 NOTE — Progress Notes (Signed)
Mobility Specialist Criteria Algorithm Info.  HOB elevated:Self regulated Activity: Ambulated in room; Dangled on edge of bed (Deferred further ambulation) Range of motion: Active; All extremities Level of assistance: Contact guard assist, steadying assist Assistive device: Cane Distance ambulated (ft): 25 ft Mobility response: Tolerated well Bed Position: Chair  Patient received dangling EOB this morning while finishing up breakfast. Stood with minimal assistance and ambulated in room at min guard with steady gait. Deferred extended distance secondary to fatigue. Returned to bed and was left lying supine with all needs met and call bell in reach.   11/04/2021 10:25 AM

## 2021-11-04 NOTE — Care Management Important Message (Signed)
Important Message  Patient Details  Name: STUTI SANDIN MRN: [id number] Date of Birth: 07/10/1936   Medicare Important Message Given:  Yes     Joetta Manners 11/04/2021, 12:14 PM

## 2021-11-04 NOTE — Progress Notes (Signed)
PROGRESS NOTE    KORTNEE BAS  ZCH:[id number] DOB: November 21, 1936 DOA: 10/31/2021 PCP: Sonia Side., FNP  Brief Narrative: 85/F with remote history of breast cancer, hypertension, dyslipidemia, peripheral vascular disease,?  Left renal mass, who presented to the ED with lower abdominal pain and discomfort for a few weeks. -In addition also has had heartburn with bitter taste in her mouth, pain is worse after eating, has been eating and drinking less, has lost a lot of weight. -Saw her PCP recently and was started on medication for infection in her throat and stomach ?  Thrush vs Hpylori, after which she developed diarrhea -In the emergency room she was noted to have a lipase of 378, creatinine of 1.7, CT abdomen noted left upper pole renal mass slightly larger than 2020 and a cystic lesion in the pancreatic tail   Assessment & Plan:   Abdominal pain, weight loss -Seems multifactorial, may have a component of chronic pancreatitis , GERD/PUD - Mesenteric ischemia is also consideration -Continue IV PPI, Carafate, lipase > 600 on 11/26, now improving -Treated with bowel rest, supportive care, diet gradually advanced, now on soft diet, dicyclomine added -Gastroenterology consulting -CTA abdomen being considered to rule out chronic mesenteric ischemia, creatinine down to 1.26, GFR is 42 , discussed with radiology, they could consider reduced dose CTA at this range, but would prefer >45 -Endoscopy be considered as well with considerable symptom overlap  Left renal mass -Larger compared to imaging in 2020, concerning for RCC, will send urgent urology referral at [facility], do not have equipment to perform ultrasound with bubble contrast medium until early next year -She is not having flank pain, did not think her abdominal symptoms are secondary to this  Indeterminant cystic lesion in pancreatic tail -Likely benign  Moderate protein calorie malnutrition -Patient reports  significant weight loss in the setting of above -RD consult,  supplements as diet advanced  Acute kidney injury -Baseline creatinine was normal in 2010, 1.7 on admission, recent baseline unknown -Hold ARB, continue IV fluids today -Creatinine stable at 1.34 now  Tobacco use -Counseled  Essential hypertension -Continue verapamil, hydralazine -Cozaar on hold  Remote history of breast cancer -Stable   DVT prophylaxis: Lovenox Code Status: DNR Family Communication: Discussed with patient in detail, no family at bedside Disposition Plan:  Status is: Inpatient  inpatient due to: Severity of illness  Consultants:  Gastroenterology  Procedures:   Antimicrobials:    Subjective: -Po intake is poor, continues to be limited by epigastric and lower abd pain after meals  Objective: Vitals:   11/03/21 1645 11/03/21 1822 11/04/21 0810 11/04/21 0828  BP: (!) 185/58 (!) 187/60 (!) 152/62 (!) 155/59  Pulse: 87 88 96 94  Resp: 16 16 19 20   Temp:  98.1 F (36.7 C) 98.4 F (36.9 C) 98.4 F (36.9 C)  TempSrc:   Oral   SpO2: 100% 100% 99% 97%  Weight:      Height:        Intake/Output Summary (Last 24 hours) at 11/04/2021 1212 Last data filed at 11/04/2021 0900 Gross per 24 hour  Intake 879.68 ml  Output --  Net 879.68 ml    Filed Weights   10/31/21 0928  Weight: 69.8 kg    Examination:  General exam: Pleasant elderly chronically ill female laying in bed, AAOx3, uncomfortable appearing CVS: S1-S2, regular rate rhythm Lungs: Decreased breath sounds to bases Abdomen: Soft, mild lower quadrant tenderness, bowel sounds present Extremities: No edema Skin: No rash  on exposed skin Psych: Flat affect   Data Reviewed:   CBC: Recent Labs  Lab 10/31/21 0955 11/01/21 0159 11/02/21 0122 11/03/21 0317 11/04/21 0210  WBC 7.3 6.1 5.8 5.7 5.3  NEUTROABS 4.9  --   --   --   --   HGB 13.3 11.1* 10.6* 11.4* 11.1*  HCT 42.4 34.8* 34.6* 36.2 34.5*  MCV 85.0 83.7 85.2 83.6  82.5  PLT 134* 112* 101* 111* 381*   Basic Metabolic Panel: Recent Labs  Lab 10/31/21 0955 11/01/21 0159 11/02/21 0122 11/03/21 0317 11/04/21 0210  NA 141 138 141 135 137  K 4.5 4.0 4.2 4.4 4.0  CL 114* 114* 117* 109 110  CO2 20* 18* 18* 22 22  GLUCOSE 83 77 83 93 85  BUN 47* 41* 34* 23 15  CREATININE 1.74* 1.47* 1.47* 1.34* 1.26*  CALCIUM 9.6 8.6* 8.6* 8.7* 8.8*   GFR: Estimated Creatinine Clearance: 30.2 mL/min (A) (by C-G formula based on SCr of 1.26 mg/dL (H)). Liver Function Tests: Recent Labs  Lab 10/31/21 0955 11/02/21 0122 11/03/21 0317 11/04/21 0210  AST 22 19 20 18   ALT 11 11 10 11   ALKPHOS 76 60 63 68  BILITOT 0.6 0.7 0.4 0.8  PROT 6.8 5.0* 5.3* 5.3*  ALBUMIN 3.2* 2.4* 2.5* 2.6*   Recent Labs  Lab 10/31/21 0955 11/01/21 0159 11/03/21 0317  LIPASE 378* 640* 480*   No results for input(s): AMMONIA in the last 168 hours. Coagulation Profile: No results for input(s): INR, PROTIME in the last 168 hours. Cardiac Enzymes: No results for input(s): CKTOTAL, CKMB, CKMBINDEX, TROPONINI in the last 168 hours. BNP (last 3 results) No results for input(s): PROBNP in the last 8760 hours. HbA1C: No results for input(s): HGBA1C in the last 72 hours. CBG: No results for input(s): GLUCAP in the last 168 hours. Lipid Profile: No results for input(s): CHOL, HDL, LDLCALC, TRIG, CHOLHDL, LDLDIRECT in the last 72 hours. Thyroid Function Tests: No results for input(s): TSH, T4TOTAL, FREET4, T3FREE, THYROIDAB in the last 72 hours. Anemia Panel: No results for input(s): VITAMINB12, FOLATE, FERRITIN, TIBC, IRON, RETICCTPCT in the last 72 hours. Urine analysis:    Component Value Date/Time   COLORURINE YELLOW 06/15/2019 1310   APPEARANCEUR CLEAR 06/15/2019 1310   LABSPEC 1.013 06/15/2019 1310   PHURINE 5.0 06/15/2019 1310   GLUCOSEU NEGATIVE 06/15/2019 1310   HGBUR MODERATE (A) 06/15/2019 1310   BILIRUBINUR NEGATIVE 06/15/2019 1310   BILIRUBINUR NEGATIVE 06/14/2019  1232   KETONESUR NEGATIVE 06/15/2019 1310   PROTEINUR 100 (A) 06/15/2019 1310   UROBILINOGEN 0.2 06/14/2019 1232   UROBILINOGEN 1.0 09/27/2014 2044   NITRITE NEGATIVE 06/15/2019 1310   LEUKOCYTESUR LARGE (A) 06/15/2019 1310   Sepsis Labs: @LABRCNTIP (procalcitonin:4,lacticidven:4)  ) Recent Results (from the past 240 hour(s))  Resp Panel by RT-PCR (Flu A&B, Covid) Nasopharyngeal Swab     Status: None   Collection Time: 10/31/21 10:00 AM   Specimen: Nasopharyngeal Swab; Nasopharyngeal(NP) swabs in vial transport medium  Result Value Ref Range Status   SARS Coronavirus 2 by RT PCR NEGATIVE NEGATIVE Final    Comment: (NOTE) SARS-CoV-2 target nucleic acids are NOT DETECTED.  The SARS-CoV-2 RNA is generally detectable in upper respiratory specimens during the acute phase of infection. The lowest concentration of SARS-CoV-2 viral copies this assay can detect is 138 copies/mL. A negative result does not preclude SARS-Cov-2 infection and should not be used as the sole basis for treatment or other patient management decisions. A negative result may occur  with  improper specimen collection/handling, submission of specimen other than nasopharyngeal swab, presence of viral mutation(s) within the areas targeted by this assay, and inadequate number of viral copies(<138 copies/mL). A negative result must be combined with clinical observations, patient history, and epidemiological information. The expected result is Negative.  Fact Sheet for Patients:  EntrepreneurPulse.com.au  Fact Sheet for Healthcare Providers:  IncredibleEmployment.be  This test is no t yet approved or cleared by the [place] FDA and  has been authorized for detection and/or diagnosis of SARS-CoV-2 by FDA under an Emergency Use Authorization (EUA). This EUA will remain  in effect (meaning this test can be used) for the duration of the COVID-19 declaration under Section 564(b)(1)  of the Act, 21 U.S.C.section 360bbb-3(b)(1), unless the authorization is terminated  or revoked sooner.       Influenza A by PCR NEGATIVE NEGATIVE Final   Influenza B by PCR NEGATIVE NEGATIVE Final    Comment: (NOTE) The Xpert Xpress SARS-CoV-2/FLU/RSV plus assay is intended as an aid in the diagnosis of influenza from Nasopharyngeal swab specimens and should not be used as a sole basis for treatment. Nasal washings and aspirates are unacceptable for Xpert Xpress SARS-CoV-2/FLU/RSV testing.  Fact Sheet for Patients: EntrepreneurPulse.com.au  Fact Sheet for Healthcare Providers: IncredibleEmployment.be  This test is not yet approved or cleared by the [place] FDA and has been authorized for detection and/or diagnosis of SARS-CoV-2 by FDA under an Emergency Use Authorization (EUA). This EUA will remain in effect (meaning this test can be used) for the duration of the COVID-19 declaration under Section 564(b)(1) of the Act, 21 U.S.C. section 360bbb-3(b)(1), unless the authorization is terminated or revoked.  Performed at [facility], [place] [address] [place], [place] [address]     Radiology Studies: No results found.   Scheduled Meds:  amLODipine  10 mg Oral Daily   aspirin EC  81 mg Oral Daily   atorvastatin  40 mg Oral QPM   dicyclomine  10 mg Oral BID   enoxaparin (LOVENOX) injection  40 mg Subcutaneous Q24H   feeding supplement  1 Container Oral TID BM   gabapentin  300 mg Oral QHS   hydrALAZINE  50 mg Oral TID   mirtazapine  15 mg Oral QHS   multivitamin with minerals  1 tablet Oral Daily   nicotine  14 mg Transdermal Q24H   pantoprazole  40 mg Oral BID   senna-docusate  1 tablet Oral BID   sucralfate  1 g Oral TID WC & HS   verapamil  240 mg Oral q AM   Continuous Infusions:     LOS: 3 days    Time spent: 28min  Domenic Polite, MD [facility]   11/04/2021, 12:12 PM

## 2021-11-04 NOTE — Progress Notes (Addendum)
Attending physician's note   I have taken an interval history, reviewed the chart and examined the patient. I agree with the Advanced Practitioner's note, impression, and recommendations as outlined.   Renal function slowly improving with BUN/creatinine 15/1.26 (from 1.34 yesterday) and GFR 42.  Continues to have epigastric pain and decreased appetite.  - Plan for EGD tomorrow to evaluate for gastritis, PUD, etc. - If EGD unrevealing, tentative plan for CT angiography when renal function allows (per discussion between Hospitalist and radiology, prefer GFR > 45 for CTA) - N.p.o. at midnight for EGD tomorrow - Continue Protonix, Bentyl, Carafate as prescribed  The indications, risks, and benefits of EGD were explained to the patient in detail. Risks include but are not limited to bleeding, perforation, adverse reaction to medications, and cardiopulmonary compromise. Sequelae include but are not limited to the possibility of surgery, hospitalization, and mortality. The patient verbalized understanding and wished to proceed. All questions answered. Further recommendations pending results of the exam.    Gerrit Heck, DO, FACG [phone] office                Daily Rounding Note  11/04/2021, 11:33 AM  LOS: 3 days   SUBJECTIVE:   Chief complaint:    Abdominal pain, elevated lipase.  Abdominal pain is not any better.  She is not tender to palpation.  No nausea or vomiting.  Anorexia persists.  Last bowel movement was 3 to 4 days ago.  OBJECTIVE:         Vital signs in last 24 hours:    Temp:  [98.1 F (36.7 C)-98.4 F (36.9 C)] 98.4 F (36.9 C) (11/29 0828) Pulse Rate:  [87-96] 94 (11/29 0828) Resp:  [13-20] 20 (11/29 0828) BP: (152-191)/(58-66) 155/59 (11/29 0828) SpO2:  [97 %-100 %] 97 % (11/29 0828) Last BM Date: 10/31/21 Filed Weights   10/31/21 0928  Weight: 69.8 kg   General: Pleasant, resting comfortably on  the bed.  Alert.  Looks somewhat frail but not acutely ill. Heart: RRR. Chest: No labored breathing or cough. Abdomen: Not tender.  Soft.  Bowel sounds normal, active. Extremities: No CCE. Neuro/Psych: Pleasant, calm, cooperative.  Oriented x3.  Intake/Output from previous day: 11/28 0701 - 11/29 0700 In: 639.7 [P.O.:200; I.V.:439.7] Out: -   Intake/Output this shift: No intake/output data recorded.  Lab Results: Recent Labs    11/02/21 0122 11/03/21 0317 11/04/21 0210  WBC 5.8 5.7 5.3  HGB 10.6* 11.4* 11.1*  HCT 34.6* 36.2 34.5*  PLT 101* 111* 117*   BMET Recent Labs    11/02/21 0122 11/03/21 0317 11/04/21 0210  NA 141 135 137  K 4.2 4.4 4.0  CL 117* 109 110  CO2 18* 22 22  GLUCOSE 83 93 85  BUN 34* 23 15  CREATININE 1.47* 1.34* 1.26*  CALCIUM 8.6* 8.7* 8.8*   LFT Recent Labs    11/02/21 0122 11/03/21 0317 11/04/21 0210  PROT 5.0* 5.3* 5.3*  ALBUMIN 2.4* 2.5* 2.6*  AST 19 20 18   ALT 11 10 11   ALKPHOS 60 63 68  BILITOT 0.7 0.4 0.8   PT/INR No results for input(s): LABPROT, INR in the last 72 hours. Hepatitis Panel No results for input(s): HEPBSAG, HCVAB, HEPAIGM, HEPBIGM in the last 72 hours.  Studies/Results: No results found.  Scheduled Meds:  amLODipine  10 mg Oral Daily   aspirin EC  81 mg Oral Daily   atorvastatin  40 mg Oral QPM   dicyclomine  10 mg Oral  BID   enoxaparin (LOVENOX) injection  40 mg Subcutaneous Q24H   feeding supplement  1 Container Oral TID BM   gabapentin  300 mg Oral QHS   hydrALAZINE  50 mg Oral TID   mirtazapine  15 mg Oral QHS   multivitamin with minerals  1 tablet Oral Daily   nicotine  14 mg Transdermal Q24H   pantoprazole  40 mg Oral BID   senna-docusate  1 tablet Oral BID   sucralfate  1 g Oral TID WC & HS   verapamil  240 mg Oral q AM   Continuous Infusions: PRN Meds:.acetaminophen **OR** acetaminophen, albuterol, bisacodyl, fentaNYL (SUBLIMAZE) injection, hydrALAZINE, ondansetron **OR** ondansetron  (ZOFRAN) IV, oxyCODONE, polyethylene glycol   ASSESMENT:   Abdominal pain, worse post prandial.  Elevated lipase but no pancreatitis on noncon CT.  2013 pancreatitis attributed to Lamisil.  Prior cholecystectomy. Recent Dx "stomach infection" (H. Pylori?),  completed abs w/O improvement.  Noncontrast CTAP w slight decrease in indeterminant cystic lesion at pancreatic tail, statistically favors IPNM.  Bile ducts unremarkable.  Lipase 378 ... 640 ... 480.  LFTs normal.      Left renal mass, concerning for cancer.  Had been evaluated earlier this year by a specialist.      AKI improving.  GFR 29... 42.    Thrombocytopenia, acute, improving.  117 today.  Spleen normal on CT.     PLAN   When will be safe to perform CTA to rule out mesenteric ischemia.  Continue empiric Protonix 40 mg po bid, Bentyl, Carafate.    Given it may be some days before a CTA is safe to pursue, ?set her up for EGD in the meantime?  Will discuss with Dr. Deniece Ree  11/04/2021, 11:33 AM Phone [phone]

## 2021-11-05 ENCOUNTER — Encounter (HOSPITAL_COMMUNITY): Payer: Self-pay | Admitting: Internal Medicine

## 2021-11-05 ENCOUNTER — Inpatient Hospital Stay (HOSPITAL_COMMUNITY): Payer: Medicare Other | Admitting: Anesthesiology

## 2021-11-05 ENCOUNTER — Encounter (HOSPITAL_COMMUNITY)
Admission: EM | Disposition: A | Discharge status: Home Health | Payer: Self-pay | Source: Home / Self Care | Attending: Family Medicine

## 2021-11-05 ENCOUNTER — Inpatient Hospital Stay (HOSPITAL_COMMUNITY): Payer: Medicare Other

## 2021-11-05 DIAGNOSIS — R1013 Epigastric pain: Secondary | ICD-10-CM | POA: Diagnosis not present

## 2021-11-05 DIAGNOSIS — R109 Unspecified abdominal pain: Secondary | ICD-10-CM | POA: Diagnosis not present

## 2021-11-05 DIAGNOSIS — K3189 Other diseases of stomach and duodenum: Secondary | ICD-10-CM

## 2021-11-05 DIAGNOSIS — K2990 Gastroduodenitis, unspecified, without bleeding: Secondary | ICD-10-CM

## 2021-11-05 DIAGNOSIS — R638 Other symptoms and signs concerning food and fluid intake: Secondary | ICD-10-CM | POA: Diagnosis not present

## 2021-11-05 DIAGNOSIS — R634 Abnormal weight loss: Secondary | ICD-10-CM | POA: Diagnosis not present

## 2021-11-05 DIAGNOSIS — E440 Moderate protein-calorie malnutrition: Secondary | ICD-10-CM | POA: Diagnosis not present

## 2021-11-05 DIAGNOSIS — K2970 Gastritis, unspecified, without bleeding: Secondary | ICD-10-CM

## 2021-11-05 HISTORY — PX: BIOPSY: SHX5522

## 2021-11-05 HISTORY — PX: ESOPHAGOGASTRODUODENOSCOPY: SHX5428

## 2021-11-05 LAB — COMPREHENSIVE METABOLIC PANEL
ALT: 11 U/L (ref 0–44)
AST: 18 U/L (ref 15–41)
Albumin: 2.8 g/dL — ABNORMAL LOW (ref 3.5–5.0)
Alkaline Phosphatase: 70 U/L (ref 38–126)
Anion gap: 6 (ref 5–15)
BUN: 10 mg/dL (ref 8–23)
CO2: 23 mmol/L (ref 22–32)
Calcium: 9 mg/dL (ref 8.9–10.3)
Chloride: 106 mmol/L (ref 98–111)
Creatinine, Ser: 1.23 mg/dL — ABNORMAL HIGH (ref 0.44–1.00)
GFR, Estimated: 44 mL/min — ABNORMAL LOW (ref >60)
Glucose, Bld: 92 mg/dL (ref 70–99)
Potassium: 3.9 mmol/L (ref 3.5–5.1)
Sodium: 135 mmol/L (ref 135–145)
Total Bilirubin: 0.8 mg/dL (ref 0.3–1.2)
Total Protein: 5.8 g/dL — ABNORMAL LOW (ref 6.5–8.1)

## 2021-11-05 LAB — CBC
HCT: 37.9 % (ref 36.0–46.0)
Hemoglobin: 12.1 g/dL (ref 12.0–15.0)
MCH: 26.4 pg (ref 26.0–34.0)
MCHC: 31.9 g/dL (ref 30.0–36.0)
MCV: 82.6 fL (ref 80.0–100.0)
Platelets: 138 10*3/uL — ABNORMAL LOW (ref 150–400)
RBC: 4.59 MIL/uL (ref 3.87–5.11)
RDW: 15.1 % (ref 11.5–15.5)
WBC: 7.4 10*3/uL (ref 4.0–10.5)
nRBC: 0 % (ref 0.0–0.2)

## 2021-11-05 SURGERY — EGD (ESOPHAGOGASTRODUODENOSCOPY)
Anesthesia: Monitor Anesthesia Care | Laterality: Left

## 2021-11-05 MED ORDER — LACTATED RINGERS IV SOLN: INTRAVENOUS | Status: DC | PRN

## 2021-11-05 MED ORDER — LIDOCAINE HCL (CARDIAC) PF 100 MG/5ML IV SOSY
PREFILLED_SYRINGE | INTRAVENOUS | Status: DC | PRN
  Administered 2021-11-05: 100 mg via INTRAVENOUS

## 2021-11-05 MED ORDER — PROPOFOL 500 MG/50ML IV EMUL
INTRAVENOUS | Status: DC | PRN
  Administered 2021-11-05: 100 ug/kg/min via INTRAVENOUS

## 2021-11-05 MED ORDER — LABETALOL HCL 5 MG/ML IV SOLN
INTRAVENOUS | Status: DC | PRN
  Administered 2021-11-05: 10 mg via INTRAVENOUS

## 2021-11-05 MED ORDER — LACTATED RINGERS IV SOLN
INTRAVENOUS | Status: AC | PRN
  Administered 2021-11-05: 1000 mL via INTRAVENOUS

## 2021-11-05 MED ORDER — PROPOFOL 10 MG/ML IV BOLUS
INTRAVENOUS | Status: DC | PRN
  Administered 2021-11-05: 30 mg via INTRAVENOUS

## 2021-11-05 MED ORDER — HYDRALAZINE HCL 50 MG PO TABS
75.0000 mg | ORAL_TABLET | Freq: Three times a day (TID) | ORAL | Status: DC
  Administered 2021-11-05 (×2): 75 mg via ORAL
  Filled 2021-11-05 (×7): qty 1

## 2021-11-05 NOTE — Op Note (Signed)
[facility] Patient Name: Linda [name] Procedure Date : 11/05/2021 MRN: [id number] Attending MD: Georgian Co ,  Date of Birth: 1936/10/11 CSN: [id number] Age: 85 Admit Type: Inpatient Procedure:                Upper GI endoscopy Indications:              Epigastric abdominal pain Providers:                Jaci Carrel, RN, Frazier Richards, Technician, Kelcy Laible                            "Linda [name] Referring MD:             Hospitalist team Medicines:                Monitored Anesthesia Care Complications:            No immediate complications. Estimated Blood Loss:     Estimated blood loss was minimal. Procedure:                After obtaining informed consent, the endoscope was                            passed under direct vision. Throughout the                            procedure, the patient's blood pressure, pulse, and                            oxygen saturations were monitored continuously. The                            [id number]-H190 ([id number]) Olympus endoscope was introduced                            through the mouth, and advanced to the second part                            of duodenum. Scope In: Scope Out: Findings:      A few white nummular lesions were noted in the entire esophagus.       Biopsies were taken with a cold forceps for histology.      Localized mildly erythematous mucosa without bleeding was found in the       gastric antrum. Biopsies were taken with a cold forceps for Helicobacter       pylori testing.      Localized atrophic mucosa was found in the duodenal bulb. Biopsies were       taken with a cold forceps for histology. Impression:               - White nummular lesions in esophageal mucosa.                            Biopsied.                           - Erythematous mucosa in the antrum. Biopsied.                           -  Duodenal mucosal atrophy. Biopsied. Recommendation:           - Return patient to hospital ward for  ongoing care.                           - No obvious source for her abdmoinal pain was                            found.                           - Plan for mesenteric duplex for further evaluation.                           - Await pathology results.                           - The findings and recommendations were discussed                            with the patient and/or primary team. Procedure Code(s):        --- Professional ---                           [phone], Esophagogastroduodenoscopy, flexible,                            transoral; with biopsy, single or multiple Diagnosis Code(s):        --- Professional ---                           K22.8, Other specified diseases of esophagus                           K31.89, Other diseases of stomach and duodenum                           R10.13, Epigastric pain CPT copyright 2019 American Medical Association. All rights reserved. The codes documented in this report are preliminary and upon coder review may  be revised to meet current compliance requirements. [name] "Linda [name],  11/05/2021 10:25:06 AM Number of Addenda: 0

## 2021-11-05 NOTE — Transfer of Care (Signed)
Immediate Anesthesia Transfer of Care Note  Patient: Linda [name]  Procedure(s) Performed: ESOPHAGOGASTRODUODENOSCOPY (EGD) (Left) BIOPSY  Patient Location: PACU  Anesthesia Type:MAC  Level of Consciousness: awake  Airway & Oxygen Therapy: Patient Spontanous Breathing  Post-op Assessment: Report given to RN and Post -op Vital signs reviewed and stable  Post vital signs: Reviewed and stable  Last Vitals:  Vitals Value Taken Time  BP 177/71 11/05/21 1030  Temp 36.4 C 11/05/21 1026  Pulse 104 11/05/21 1030  Resp 21 11/05/21 1031  SpO2 99 % 11/05/21 1030  Vitals shown include unvalidated device data.  Last Pain:  Vitals:   11/05/21 1027  TempSrc:   PainSc: 0-No pain      Patients Stated Pain Goal: 2 (27/03/50 0938)  Complications: No notable events documented.

## 2021-11-05 NOTE — Interval H&P Note (Signed)
History and Physical Interval Note:  57/49/3552 1:74 AM  Linda [name]  has presented today for surgery, with the diagnosis of Epigastric pain, nausea, weight loss.  The various methods of treatment have been discussed with the patient and family. After consideration of risks, benefits and other options for treatment, the patient has consented to  Procedure(s): ESOPHAGOGASTRODUODENOSCOPY (EGD) (Left) as a surgical intervention.  The patient's history has been reviewed, patient examined, no change in status, stable for surgery.  I have reviewed the patient's chart and labs.  Questions were answered to the patient's satisfaction.     Sharyn Creamer

## 2021-11-05 NOTE — Anesthesia Postprocedure Evaluation (Signed)
Anesthesia Post Note  Patient: Linda [name]  Procedure(s) Performed: ESOPHAGOGASTRODUODENOSCOPY (EGD) (Left) BIOPSY     Patient location during evaluation: Endoscopy Anesthesia Type: MAC Level of consciousness: awake and sedated Pain management: pain level controlled Vital Signs Assessment: post-procedure vital signs reviewed and stable Respiratory status: spontaneous breathing Cardiovascular status: stable Postop Assessment: no apparent nausea or vomiting Anesthetic complications: no   No notable events documented.  Last Vitals:  Vitals:   11/05/21 1045 11/05/21 1055  BP: (!) 165/69 (!) 144/65  Pulse: 89 88  Resp: 19 20  Temp:    SpO2: 99% 100%    Last Pain:  Vitals:   11/05/21 1031  TempSrc:   PainSc: 0-No pain                 Huston Foley

## 2021-11-05 NOTE — Progress Notes (Signed)
Mesenteric artery duplex study completed.   Please see CV Proc for preliminary results.   Mikeyla Music, RDMS, RVT  

## 2021-11-05 NOTE — Progress Notes (Addendum)
PROGRESS NOTE    Linda [name]  HUD:[id number] DOB: 11-27-1936 DOA: 10/31/2021 PCP: Sonia Side., FNP  Brief Narrative: 85/F with remote history of breast cancer, hypertension, dyslipidemia, peripheral vascular disease,?  Left renal mass, who presented to the ED with lower abdominal pain and discomfort for a 4-5 weeks.,  Worse after eating, poor p.o. intake and ongoing weight loss -In the emergency room she was noted to have a lipase of 378, creatinine of 1.7, CT abdomen noted left upper pole renal mass slightly larger than 2020 and a cystic lesion in the pancreatic tail -Gastroenterology consulting, CTA abdomen was being considered to rule out mesenteric ischemia, this was deferred due to AKI -Endoscopy 11/30 unrevealing  Assessment & Plan:   Abdominal pain, weight loss -Seems multifactorial, may have a component of chronic pancreatitis , Mesenteric ischemia was also consideration -Continue IV PPI, Carafate, lipase > 600 on 11/26, now improving -Treated with bowel rest, supportive care, diet gradually advanced, now on soft diet, dicyclomine added -Gastroenterology consulting -Underwent endoscopy this morning, this was unrevealing, Dr. Lorenso Courier ordered a mesenteric duplex today -CTA abdomen was being considered to rule out chronic mesenteric ischemia, creatinine down to 1.26, GFR is 44 , discussed with radiology yesterday, they can consider reduced dose CTA at this GFR range,, first follow-up mesenteric duplex -Supportive care -Ambulate, PT eval  Left renal mass -Larger compared to imaging in 2020, concerning for RCC, will need urgent urology referral at [facility], do not have equipment to perform ultrasound with bubble contrast medium until early next year -She is not having flank pain, did not think her abdominal symptoms are secondary to this -Updated daughter regarding importance of close urology follow-up  Indeterminant cystic lesion in pancreatic tail -Likely  benign  Moderate protein calorie malnutrition -Patient reports significant weight loss in the setting of above -RD consult,  supplements as diet advanced  Acute kidney injury -Baseline creatinine was normal in 2010, 1.7 on admission, recent baseline unknown -ARB discontinued, hydrated with IV fluids, creatinine down to 1.2 now  Tobacco use -Counseled  Essential hypertension -Continue verapamil, hydralazine, amlodipine -Cozaar on hold, increase hydralazine dose  Remote history of breast cancer -Stable   DVT prophylaxis: Lovenox Code Status: DNR Family Communication: No family at bedside, called and updated patient daughter April Disposition Plan:  Status is: Inpatient  inpatient due to: Severity of illness  Consultants:  Gastroenterology  Procedures:   Antimicrobials:    Subjective: -Po intake is poor, continues to be limited by epigastric and lower abd pain after meals  Objective: Vitals:   11/05/21 1035 11/05/21 1045 11/05/21 1055 11/05/21 1103  BP: 121/78 (!) 165/69 (!) 144/65 (!) 161/67  Pulse: 96 89 88 86  Resp: (!) 27 19 20 17   Temp:      TempSrc:      SpO2: 99% 99% 100% 99%  Weight:      Height:        Intake/Output Summary (Last 24 hours) at 11/05/2021 1132 Last data filed at 11/05/2021 1021 Gross per 24 hour  Intake 300 ml  Output 650 ml  Net -350 ml    Filed Weights   10/31/21 0928  Weight: 69.8 kg    Examination:  General exam: Pleasant elderly chronically ill female laying in bed, AAOx3, uncomfortable appearing CVS: S1-S2, regular rate rhythm Lungs: Decreased breath sounds to bases Abdomen: Soft, mild lower quadrant tenderness, bowel sounds present Extremities: No edema Skin: No rash on exposed skin Psych: Flat affect  Data Reviewed:   CBC: Recent Labs  Lab 10/31/21 0955 11/01/21 0159 11/02/21 0122 11/03/21 0317 11/04/21 0210 11/05/21 0246  WBC 7.3 6.1 5.8 5.7 5.3 7.4  NEUTROABS 4.9  --   --   --   --   --   HGB 13.3  11.1* 10.6* 11.4* 11.1* 12.1  HCT 42.4 34.8* 34.6* 36.2 34.5* 37.9  MCV 85.0 83.7 85.2 83.6 82.5 82.6  PLT 134* 112* 101* 111* 117* 409*   Basic Metabolic Panel: Recent Labs  Lab 11/01/21 0159 11/02/21 0122 11/03/21 0317 11/04/21 0210 11/05/21 0246  NA 138 141 135 137 135  K 4.0 4.2 4.4 4.0 3.9  CL 114* 117* 109 110 106  CO2 18* 18* 22 22 23   GLUCOSE 77 83 93 85 92  BUN 41* 34* 23 15 10   CREATININE 1.47* 1.47* 1.34* 1.26* 1.23*  CALCIUM 8.6* 8.6* 8.7* 8.8* 9.0   GFR: Estimated Creatinine Clearance: 31 mL/min (A) (by C-G formula based on SCr of 1.23 mg/dL (H)). Liver Function Tests: Recent Labs  Lab 10/31/21 0955 11/02/21 0122 11/03/21 0317 11/04/21 0210 11/05/21 0246  AST 22 19 20 18 18   ALT 11 11 10 11 11   ALKPHOS 76 60 63 68 70  BILITOT 0.6 0.7 0.4 0.8 0.8  PROT 6.8 5.0* 5.3* 5.3* 5.8*  ALBUMIN 3.2* 2.4* 2.5* 2.6* 2.8*   Recent Labs  Lab 10/31/21 0955 11/01/21 0159 11/03/21 0317  LIPASE 378* 640* 480*   No results for input(s): AMMONIA in the last 168 hours. Coagulation Profile: No results for input(s): INR, PROTIME in the last 168 hours. Cardiac Enzymes: No results for input(s): CKTOTAL, CKMB, CKMBINDEX, TROPONINI in the last 168 hours. BNP (last 3 results) No results for input(s): PROBNP in the last 8760 hours. HbA1C: No results for input(s): HGBA1C in the last 72 hours. CBG: No results for input(s): GLUCAP in the last 168 hours. Lipid Profile: No results for input(s): CHOL, HDL, LDLCALC, TRIG, CHOLHDL, LDLDIRECT in the last 72 hours. Thyroid Function Tests: No results for input(s): TSH, T4TOTAL, FREET4, T3FREE, THYROIDAB in the last 72 hours. Anemia Panel: No results for input(s): VITAMINB12, FOLATE, FERRITIN, TIBC, IRON, RETICCTPCT in the last 72 hours. Urine analysis:    Component Value Date/Time   COLORURINE YELLOW 06/15/2019 1310   APPEARANCEUR CLEAR 06/15/2019 1310   LABSPEC 1.013 06/15/2019 1310   PHURINE 5.0 06/15/2019 1310   GLUCOSEU  NEGATIVE 06/15/2019 1310   HGBUR MODERATE (A) 06/15/2019 1310   BILIRUBINUR NEGATIVE 06/15/2019 1310   BILIRUBINUR NEGATIVE 06/14/2019 1232   KETONESUR NEGATIVE 06/15/2019 1310   PROTEINUR 100 (A) 06/15/2019 1310   UROBILINOGEN 0.2 06/14/2019 1232   UROBILINOGEN 1.0 09/27/2014 2044   NITRITE NEGATIVE 06/15/2019 1310   LEUKOCYTESUR LARGE (A) 06/15/2019 1310   Sepsis Labs: @LABRCNTIP (procalcitonin:4,lacticidven:4)  ) Recent Results (from the past 240 hour(s))  Resp Panel by RT-PCR (Flu A&B, Covid) Nasopharyngeal Swab     Status: None   Collection Time: 10/31/21 10:00 AM   Specimen: Nasopharyngeal Swab; Nasopharyngeal(NP) swabs in vial transport medium  Result Value Ref Range Status   SARS Coronavirus 2 by RT PCR NEGATIVE NEGATIVE Final    Comment: (NOTE) SARS-CoV-2 target nucleic acids are NOT DETECTED.  The SARS-CoV-2 RNA is generally detectable in upper respiratory specimens during the acute phase of infection. The lowest concentration of SARS-CoV-2 viral copies this assay can detect is 138 copies/mL. A negative result does not preclude SARS-Cov-2 infection and should not be used as the sole basis for treatment  or other patient management decisions. A negative result may occur with  improper specimen collection/handling, submission of specimen other than nasopharyngeal swab, presence of viral mutation(s) within the areas targeted by this assay, and inadequate number of viral copies(<138 copies/mL). A negative result must be combined with clinical observations, patient history, and epidemiological information. The expected result is Negative.  Fact Sheet for Patients:  EntrepreneurPulse.com.au  Fact Sheet for Healthcare Providers:  IncredibleEmployment.be  This test is no t yet approved or cleared by the [place] FDA and  has been authorized for detection and/or diagnosis of SARS-CoV-2 by FDA under an Emergency Use Authorization  (EUA). This EUA will remain  in effect (meaning this test can be used) for the duration of the COVID-19 declaration under Section 564(b)(1) of the Act, 21 U.S.C.section 360bbb-3(b)(1), unless the authorization is terminated  or revoked sooner.       Influenza A by PCR NEGATIVE NEGATIVE Final   Influenza B by PCR NEGATIVE NEGATIVE Final    Comment: (NOTE) The Xpert Xpress SARS-CoV-2/FLU/RSV plus assay is intended as an aid in the diagnosis of influenza from Nasopharyngeal swab specimens and should not be used as a sole basis for treatment. Nasal washings and aspirates are unacceptable for Xpert Xpress SARS-CoV-2/FLU/RSV testing.  Fact Sheet for Patients: EntrepreneurPulse.com.au  Fact Sheet for Healthcare Providers: IncredibleEmployment.be  This test is not yet approved or cleared by the [place] FDA and has been authorized for detection and/or diagnosis of SARS-CoV-2 by FDA under an Emergency Use Authorization (EUA). This EUA will remain in effect (meaning this test can be used) for the duration of the COVID-19 declaration under Section 564(b)(1) of the Act, 21 U.S.C. section 360bbb-3(b)(1), unless the authorization is terminated or revoked.  Performed at [facility], [place] [address], [place],  [address]     Radiology Studies: No results found.   Scheduled Meds:  amLODipine  10 mg Oral Daily   aspirin EC  81 mg Oral Daily   atorvastatin  40 mg Oral QPM   dicyclomine  10 mg Oral BID   enoxaparin (LOVENOX) injection  40 mg Subcutaneous Q24H   feeding supplement  1 Container Oral TID BM   gabapentin  300 mg Oral QHS   hydrALAZINE  50 mg Oral TID   mirtazapine  15 mg Oral QHS   multivitamin with minerals  1 tablet Oral Daily   nicotine  14 mg Transdermal Q24H   pantoprazole  40 mg Oral BID   senna-docusate  1 tablet Oral BID   sucralfate  1 g Oral TID WC & HS   verapamil  240 mg Oral q AM   Continuous  Infusions:     LOS: 4 days    Time spent: 58min  Domenic Polite, MD [facility]   11/05/2021, 11:32 AM

## 2021-11-05 NOTE — Anesthesia Procedure Notes (Signed)
Procedure Name: MAC Date/Time: 11/05/2021 10:09 AM Performed by: Erick Colace, CRNA Pre-anesthesia Checklist: Patient identified, Emergency Drugs available, Suction available, Patient being monitored and Timeout performed Patient Re-evaluated:Patient Re-evaluated prior to induction Oxygen Delivery Method: Simple face mask Preoxygenation: Pre-oxygenation with 100% oxygen Induction Type: IV induction

## 2021-11-05 NOTE — Anesthesia Preprocedure Evaluation (Signed)
Anesthesia Evaluation  Patient identified by MRN, date of birth, ID band Patient awake    Reviewed: Allergy & Precautions, NPO status , Patient's Chart, lab work & pertinent test results  Airway Mallampati: II       Dental  (+) Edentulous Upper, Edentulous Lower   Pulmonary Current Smoker and Patient abstained from smoking.,    Pulmonary exam normal        Cardiovascular hypertension, Pt. on medications + Peripheral Vascular Disease  Normal cardiovascular exam     Neuro/Psych negative neurological ROS  negative psych ROS   GI/Hepatic   Endo/Other  negative endocrine ROS  Renal/GU  Bladder dysfunction      Musculoskeletal   Abdominal Normal abdominal exam  (+)   Peds  Hematology negative hematology ROS (+)   Anesthesia Other Findings   Reproductive/Obstetrics                             Anesthesia Physical Anesthesia Plan  ASA: 3  Anesthesia Plan: MAC   Post-op Pain Management:    Induction: Intravenous  PONV Risk Score and Plan: Propofol infusion  Airway Management Planned: Natural Airway and Mask  Additional Equipment: None  Intra-op Plan:   Post-operative Plan:   Informed Consent: I have reviewed the patients History and Physical, chart, labs and discussed the procedure including the risks, benefits and alternatives for the proposed anesthesia with the patient or authorized representative who has indicated his/her understanding and acceptance.       Plan Discussed with: CRNA  Anesthesia Plan Comments:         Anesthesia Quick Evaluation

## 2021-11-06 ENCOUNTER — Encounter (HOSPITAL_COMMUNITY): Payer: Self-pay | Admitting: Internal Medicine

## 2021-11-06 DIAGNOSIS — R634 Abnormal weight loss: Secondary | ICD-10-CM | POA: Diagnosis not present

## 2021-11-06 DIAGNOSIS — I10 Essential (primary) hypertension: Secondary | ICD-10-CM | POA: Diagnosis not present

## 2021-11-06 DIAGNOSIS — R109 Unspecified abdominal pain: Secondary | ICD-10-CM | POA: Diagnosis not present

## 2021-11-06 DIAGNOSIS — K2990 Gastroduodenitis, unspecified, without bleeding: Secondary | ICD-10-CM

## 2021-11-06 DIAGNOSIS — R638 Other symptoms and signs concerning food and fluid intake: Secondary | ICD-10-CM | POA: Diagnosis not present

## 2021-11-06 DIAGNOSIS — F17210 Nicotine dependence, cigarettes, uncomplicated: Secondary | ICD-10-CM | POA: Diagnosis not present

## 2021-11-06 DIAGNOSIS — E440 Moderate protein-calorie malnutrition: Secondary | ICD-10-CM | POA: Diagnosis not present

## 2021-11-06 DIAGNOSIS — Z79899 Other long term (current) drug therapy: Secondary | ICD-10-CM | POA: Diagnosis not present

## 2021-11-06 DIAGNOSIS — N179 Acute kidney failure, unspecified: Secondary | ICD-10-CM

## 2021-11-06 DIAGNOSIS — K2970 Gastritis, unspecified, without bleeding: Secondary | ICD-10-CM | POA: Diagnosis not present

## 2021-11-06 DIAGNOSIS — R1013 Epigastric pain: Secondary | ICD-10-CM | POA: Diagnosis not present

## 2021-11-06 LAB — CBC
HCT: 39.9 % (ref 36.0–46.0)
Hemoglobin: 12.5 g/dL (ref 12.0–15.0)
MCH: 25.8 pg — ABNORMAL LOW (ref 26.0–34.0)
MCHC: 31.3 g/dL (ref 30.0–36.0)
MCV: 82.3 fL (ref 80.0–100.0)
Platelets: 141 10*3/uL — ABNORMAL LOW (ref 150–400)
RBC: 4.85 MIL/uL (ref 3.87–5.11)
RDW: 15 % (ref 11.5–15.5)
WBC: 6.2 10*3/uL (ref 4.0–10.5)
nRBC: 0 % (ref 0.0–0.2)

## 2021-11-06 LAB — COMPREHENSIVE METABOLIC PANEL
ALT: 11 U/L (ref 0–44)
AST: 17 U/L (ref 15–41)
Albumin: 2.9 g/dL — ABNORMAL LOW (ref 3.5–5.0)
Alkaline Phosphatase: 69 U/L (ref 38–126)
Anion gap: 9 (ref 5–15)
BUN: 12 mg/dL (ref 8–23)
CO2: 21 mmol/L — ABNORMAL LOW (ref 22–32)
Calcium: 9.2 mg/dL (ref 8.9–10.3)
Chloride: 106 mmol/L (ref 98–111)
Creatinine, Ser: 1.27 mg/dL — ABNORMAL HIGH (ref 0.44–1.00)
GFR, Estimated: 42 mL/min — ABNORMAL LOW (ref >60)
Glucose, Bld: 69 mg/dL — ABNORMAL LOW (ref 70–99)
Potassium: 3.9 mmol/L (ref 3.5–5.1)
Sodium: 136 mmol/L (ref 135–145)
Total Bilirubin: 0.6 mg/dL (ref 0.3–1.2)
Total Protein: 6 g/dL — ABNORMAL LOW (ref 6.5–8.1)

## 2021-11-06 LAB — SURGICAL PATHOLOGY

## 2021-11-06 LAB — PREALBUMIN: Prealbumin: 11.9 mg/dL — ABNORMAL LOW (ref 18–38)

## 2021-11-06 MED ORDER — ENOXAPARIN SODIUM 30 MG/0.3ML IJ SOSY
30.0000 mg | PREFILLED_SYRINGE | INTRAMUSCULAR | Status: DC
  Administered 2021-11-06 – 2021-11-20 (×15): 30 mg via SUBCUTANEOUS
  Filled 2021-11-06 (×15): qty 0.3

## 2021-11-06 MED ORDER — PANTOPRAZOLE SODIUM 40 MG PO TBEC
40.0000 mg | DELAYED_RELEASE_TABLET | Freq: Every day | ORAL | Status: DC
  Filled 2021-11-06: qty 1

## 2021-11-06 MED ORDER — ONDANSETRON HCL 4 MG/2ML IJ SOLN
4.0000 mg | Freq: Four times a day (QID) | INTRAMUSCULAR | Status: DC
  Administered 2021-11-06 – 2021-11-20 (×34): 4 mg via INTRAVENOUS
  Filled 2021-11-06 (×37): qty 2

## 2021-11-06 MED ORDER — ONDANSETRON HCL 4 MG PO TABS
4.0000 mg | ORAL_TABLET | Freq: Four times a day (QID) | ORAL | Status: DC
  Administered 2021-11-09 – 2021-11-21 (×14): 4 mg via ORAL
  Filled 2021-11-06 (×22): qty 1

## 2021-11-06 NOTE — Progress Notes (Signed)
Nutrition Follow-up  DOCUMENTATION CODES:   Non-severe (moderate) malnutrition in context of chronic illness  INTERVENTION:   Initiate tube feeding via Cortrak once placed and confirmed by xray: Vital 1.5 at 20 ml/h and increase by 15m every 8 hours to goal rate of 50 ml/hr (1200 ml per day) Prosource TF 45 ml BID  Provides 1700 kcal, 94 gm protein, 917 ml free water daily  - Continue MVI daily  - Discontinue Boost Breeze  NUTRITION DIAGNOSIS:   Moderate Malnutrition related to chronic illness (PAD) as evidenced by mild fat depletion, moderate muscle depletion.  On-going  GOAL:   Patient will meet greater than or equal to 90% of their needs  Goal not met- pt to receive Cortrak placement for initiation of tube feeding  MONITOR:   Diet advancement, PO intake, Supplement acceptance, Labs, I & O's  REASON FOR ASSESSMENT:   Consult Enteral/tube feeding initiation and management  ASSESSMENT:   85yo female with a PMH of  breast cancer, hypertension, dyslipidemia, peripheral vascular disease, ? L renal mass, who presented to the ED with lower abdominal pain and discomfort for a few weeks. Admitted with abdominal pain.  Pt had an upper GI endoscopy which was unrevealing. Per GI, noted to have L renal mass- concerning for indolent renal cell carcinoma. Pt with possible mesenteric ischemia however CT abdomen deferred d/t AKI.  Per GI, plan to address malnutrition stemming from post-prandial pain via Cortrak. However, if pain continues, may consider TPN for nutritional intake.  Pt states that she feels weak. Noted lunch tray on side table with only a couple bites eaten. Pt has tried to eat and drink, including Boost supplements, but is unable to tolerate without stomach pains. Cortrak to be placed tomorrow. Spoke with GI, ok to place gastric.  Medications: remeron, MVI with minerals daily, protonix, senna, carafate  Labs: Cr 1.27  Diet Order:   Diet Order              Diet Heart Room service appropriate? Yes; Fluid consistency: Thin  Diet effective now                   EDUCATION NEEDS:   Education needs have been addressed  Skin:  Skin Assessment: Reviewed RN Assessment  Last BM:  no BM documented  Height:   Ht Readings from Last 1 Encounters:  10/31/21 _0  (1.549 m)    Weight:   Wt Readings from Last 1 Encounters:  11/06/21 61.2 kg    BMI:  Body mass index is 25.48 kg/m.  Estimated Nutritional Needs:   Kcal:  1650-1850  Protein:  85-100 grams  Fluid:  >1.65 L  AClayborne Dana RDN, LDN Clinical Nutrition

## 2021-11-06 NOTE — Progress Notes (Signed)
Mobility Specialist Criteria Algorithm Info.   11/06/21 1010  Mobility  Activity Dangled on edge of bed;Refused mobility  Range of Motion/Exercises Active;All extremities  Level of Assistance Independent  Assistive Device None   Patient received lying supine in bed initially willing to participate in mobility. Got to EOB independently and c/o pain and not feeling well. Declined further mobility activities and requested lay back down. Was left in bed with all needs met and call bell in reach.  11/06/2021 12:29 PM

## 2021-11-06 NOTE — Progress Notes (Signed)
[facility]  PROGRESS NOTE  Linda [name] DOB: 04-14-1936 DOA: 10/31/2021 PCP: Linda Side., FNP   Brief HPI:   85 year old female with remote history of breast cancer, hypertension, dyslipidemia, peripheral vascular disease, questionable left renal mass came to ED with complaints of lower abdominal pain and discomfort for 4 to 5 weeks.  Worse after eating, poor p.o. intake and ongoing weight loss.  In the ED she was found to have elevated lipase 378, creatinine 1.7 CT abdomen showed left upper pole renal mass larger than 2020 and a cystic lesion in the pancreatic tail.  Gastroenterology was consulted, CT abdomen was being considered to rule out mesenteric ischemia which was deferred due to acute kidney injury.  Endoscopy on 11/30 was unrevealing.    Subjective   Patient continues to have abdominal pain.  Mesenteric artery duplex shows increased velocity and cystic artery.  GI recommending to have vascular surgery comment on that.   Assessment/Plan:    Abdominal pain/weight loss -Unclear etiology -Mesenteric ischemia and consideration, mesenteric artery duplex shows increased velocity in the celiac artery -Gastroenterology recommends getting vascular surgery consult -We will consult vascular surgery, might need CT abdomen/pelvis which has been on hold at this time due to acute kidney injury; creatinine is now 1.27  Left renal mass -Larger as compared to imaging in 2020 -Concerning for renal cell carcinoma -She will need urgent referral to urology at discharge  Indeterminant  cystic lesion in pancreatic tail -Likely benign  Moderate protein calorie malnutrition -Patient reported significant weight loss due to above -Dietitian consulted  Acute kidney injury -Patient had creatinine 1.7 on admission -ARB was discontinued, creatinine improved with IV fluids  Hypertension -Blood pressure is stable -Continue verapamil, hydralazine, amlodipine -Cozaar on  hold  Remote history of breast cancer -Stable     Medications     amLODipine  10 mg Oral Daily   aspirin EC  81 mg Oral Daily   atorvastatin  40 mg Oral QPM   dicyclomine  10 mg Oral BID   enoxaparin (LOVENOX) injection  30 mg Subcutaneous Q24H   feeding supplement  1 Container Oral TID BM   gabapentin  300 mg Oral QHS   hydrALAZINE  75 mg Oral TID   mirtazapine  15 mg Oral QHS   multivitamin with minerals  1 tablet Oral Daily   nicotine  14 mg Transdermal Q24H   pantoprazole  40 mg Oral BID   senna-docusate  1 tablet Oral BID   sucralfate  1 g Oral TID WC & HS   verapamil  240 mg Oral q AM     Data Reviewed:   CBG:  No results for input(s): GLUCAP in the last 168 hours.  SpO2: 100 %    Vitals:   11/05/21 2114 11/06/21 0436 11/06/21 0612 11/06/21 0802  BP: 118/83 (!) 145/65  (!) 151/43  Pulse: (!) 110 (!) 107  (!) 108  Resp: 16 14  19   Temp: 98.5 F (36.9 C) 98.9 F (37.2 C)  98.1 F (36.7 C)  TempSrc: Oral Oral    SpO2: 100% 97%  100%  Weight:   61.2 kg   Height:         Intake/Output Summary (Last 24 hours) at 11/06/2021 1004 Last data filed at 11/05/2021 1454 Gross per 24 hour  Intake 540 ml  Output --  Net 540 ml    11/29 1901 - 12/01 0700 In: 540 [P.O.:240; I.V.:300] Out: -   [employer]  10/31/21 0928 11/06/21 0612  Weight: 69.8 kg 61.2 kg    Data Reviewed: Basic Metabolic Panel: Recent Labs  Lab 11/02/21 0122 11/03/21 0317 11/04/21 0210 11/05/21 0246 11/06/21 0244  NA 141 135 137 135 136  K 4.2 4.4 4.0 3.9 3.9  CL 117* 109 110 106 106  CO2 18* 22 22 23  21*  GLUCOSE 83 93 85 92 69*  BUN 34* 23 15 10 12   CREATININE 1.47* 1.34* 1.26* 1.23* 1.27*  CALCIUM 8.6* 8.7* 8.8* 9.0 9.2   Liver Function Tests: Recent Labs  Lab 11/02/21 0122 11/03/21 0317 11/04/21 0210 11/05/21 0246 11/06/21 0244  AST 19 20 18 18 17   ALT 11 10 11 11 11   ALKPHOS 60 63 68 70 69  BILITOT 0.7 0.4 0.8 0.8 0.6  PROT 5.0* 5.3* 5.3* 5.8* 6.0*   ALBUMIN 2.4* 2.5* 2.6* 2.8* 2.9*   Recent Labs  Lab 10/31/21 0955 11/01/21 0159 11/03/21 0317  LIPASE 378* 640* 480*   No results for input(s): AMMONIA in the last 168 hours. CBC: Recent Labs  Lab 10/31/21 0955 11/01/21 0159 11/02/21 0122 11/03/21 0317 11/04/21 0210 11/05/21 0246 11/06/21 0244  WBC 7.3   < > 5.8 5.7 5.3 7.4 6.2  NEUTROABS 4.9  --   --   --   --   --   --   HGB 13.3   < > 10.6* 11.4* 11.1* 12.1 12.5  HCT 42.4   < > 34.6* 36.2 34.5* 37.9 39.9  MCV 85.0   < > 85.2 83.6 82.5 82.6 82.3  PLT 134*   < > 101* 111* 117* 138* 141*   < > = values in this interval not displayed.   Cardiac Enzymes: No results for input(s): CKTOTAL, CKMB, CKMBINDEX, TROPONINI in the last 168 hours. BNP (last 3 results) No results for input(s): BNP in the last 8760 hours.  ProBNP (last 3 results) No results for input(s): PROBNP in the last 8760 hours.  CBG: No results for input(s): GLUCAP in the last 168 hours.     Radiology Reports  VAS [place] MESENTERIC  Result Date: 11/05/2021 ABDOMINAL VISCERAL Patient Name:  Linda [name]  Date of Exam:   11/05/2021 Medical Rec #: [id number]       Accession #:    [id number] Date of Birth: [birth year]        Patient Gender: F Patient Age:   76 years Exam Location:  [facility] Procedure:      VAS [place] MESENTERIC Referring Phys: [place] -------------------------------------------------------------------------------- Indications: Weight loss, abdominal pain Limitations: Air/bowel gas. Comparison Study: No prior studies. Performing Technologist: Darlin Coco RDMS, RVT  Examination Guidelines: A complete evaluation includes B-mode imaging, spectral Doppler, color Doppler, and power Doppler as needed of all accessible portions of each vessel. Bilateral testing is considered an integral part of a complete examination. Limited examinations for reoccurring indications may be performed as noted.  Duplex Findings:  +----------------------+--------+--------+------+--------+ Mesenteric            PSV cm/sEDV cm/sPlaqueComments +----------------------+--------+--------+------+--------+ Aorta Mid                48                          +----------------------+--------+--------+------+--------+ Celiac Artery Origin    228                          +----------------------+--------+--------+------+--------+ Celiac Artery  Proximal  211                          +----------------------+--------+--------+------+--------+ SMA Origin              173                          +----------------------+--------+--------+------+--------+ SMA Proximal            104                          +----------------------+--------+--------+------+--------+ SMA Mid                 131                          +----------------------+--------+--------+------+--------+ SMA Distal               85                          +----------------------+--------+--------+------+--------+ IMA                      55                          +----------------------+--------+--------+------+--------+    Summary: Mesenteric:  Mildly elevated velocities observed in the celiac axis. Patent SMA and IMA.  *See table(s) above for measurements and observations.  Diagnosing physician: Jamelle Haring  Electronically signed by Jamelle Haring on 11/05/2021 at 48:04:13 PM.    Final        Antibiotics: Anti-infectives (From admission, onward)    None         DVT prophylaxis: Lovenox  Code Status: DNR  Family Communication: No family at bedside   Consultants: Gastroenterology  Procedures: Mesenteric artery duplex    Objective    Physical Examination:   General-appears in no acute distress Heart-S1-S2, regular, no murmur auscultated Lungs-clear to auscultation bilaterally, no wheezing or crackles auscultated Abdomen-soft, tenderness to palpation in epigastric region,  no  organomegaly Extremities-no edema in the lower extremities Neuro-alert, oriented x3, no focal deficit noted  Status is: Inpatient  Dispo: The patient is from: Home              Anticipated d/c is to: Home              Anticipated d/c date is: 11/10/2021              Patient currently not stable for discharge  Barrier to discharge-ongoing evaluation for abdominal pain  COVID-19 Labs  No results for input(s): DDIMER, FERRITIN, LDH, CRP in the last 72 hours.  Lab Results  Component Value Date   SARSCOV2NAA NEGATIVE 10/31/2021   SARSCOV2NAA POSITIVE (A) 06/15/2019            Recent Results (from the past 240 hour(s))  Resp Panel by RT-PCR (Flu A&B, Covid) Nasopharyngeal Swab     Status: None   Collection Time: 10/31/21 10:00 AM   Specimen: Nasopharyngeal Swab; Nasopharyngeal(NP) swabs in vial transport medium  Result Value Ref Range Status   SARS Coronavirus 2 by RT PCR NEGATIVE NEGATIVE Final    Comment: (NOTE) SARS-CoV-2 target nucleic acids are NOT DETECTED.  The SARS-CoV-2 RNA is generally detectable in upper respiratory specimens during the acute phase  of infection. The lowest concentration of SARS-CoV-2 viral copies this assay can detect is 138 copies/mL. A negative result does not preclude SARS-Cov-2 infection and should not be used as the sole basis for treatment or other patient management decisions. A negative result may occur with  improper specimen collection/handling, submission of specimen other than nasopharyngeal swab, presence of viral mutation(s) within the areas targeted by this assay, and inadequate number of viral copies(<138 copies/mL). A negative result must be combined with clinical observations, patient history, and epidemiological information. The expected result is Negative.  Fact Sheet for Patients:  EntrepreneurPulse.com.au  Fact Sheet for Healthcare Providers:  IncredibleEmployment.be  This test is  no t yet approved or cleared by the [place] FDA and  has been authorized for detection and/or diagnosis of SARS-CoV-2 by FDA under an Emergency Use Authorization (EUA). This EUA will remain  in effect (meaning this test can be used) for the duration of the COVID-19 declaration under Section 564(b)(1) of the Act, 21 U.S.C.section 360bbb-3(b)(1), unless the authorization is terminated  or revoked sooner.       Influenza A by PCR NEGATIVE NEGATIVE Final   Influenza B by PCR NEGATIVE NEGATIVE Final    Comment: (NOTE) The Xpert Xpress SARS-CoV-2/FLU/RSV plus assay is intended as an aid in the diagnosis of influenza from Nasopharyngeal swab specimens and should not be used as a sole basis for treatment. Nasal washings and aspirates are unacceptable for Xpert Xpress SARS-CoV-2/FLU/RSV testing.  Fact Sheet for Patients: EntrepreneurPulse.com.au  Fact Sheet for Healthcare Providers: IncredibleEmployment.be  This test is not yet approved or cleared by the [place] FDA and has been authorized for detection and/or diagnosis of SARS-CoV-2 by FDA under an Emergency Use Authorization (EUA). This EUA will remain in effect (meaning this test can be used) for the duration of the COVID-19 declaration under Section 564(b)(1) of the Act, 21 U.S.C. section 360bbb-3(b)(1), unless the authorization is terminated or revoked.  Performed at [facility], [address] [place], [place] [address]     [facility] If 7PM-7AM, please contact night-coverage at [email or web address], Office  [phone]   11/06/2021, 10:04 AM  LOS: 5 days

## 2021-11-06 NOTE — Progress Notes (Addendum)
Patient ID: Linda [name], female   DOB: Nov 08, 1936, 85 y.o.   MRN: [id number]     Attending physician's note   I have taken an interval history, reviewed the chart and examined the patient. I agree with the Advanced Practitioner's note, impression, and recommendations as outlined.   EGD on 11/30 with mild gastritis and atrophic appearing duodenum (path pending), very unlikely to be etiology of presenting symptoms.  Otherwise, clinically her postprandial abdominal pain and nausea seems most c/w vascular etiology.  Mesenteric duplex with mildly increased velocity in the Celiac Artery with otherwise patent IMA/SMA.  Plan for Vascular Surgery consult prior to ordering CT angio due to renal impairment (kidney function currently stable but GFR 42).  Agree with plans for addressing significant malnutrition which stems from a postprandial pain.  We will try core track to maintain enteral nutrition.  If this is prohibitive due to post being pain, may need to change to [place], DO, FACG [phone] office          Progress Note   Subjective   Day # 6 CC; 5-week history of abdominal pain, worse postprandially with associated weight loss  EGD-unremarkable Mesenteric artery duplex-increased velocity in the celiac artery CT angio not pursued secondary to renal insufficiency  CT abdomen pelvis without contrast 1.9 cm solid left upper pole kidney mass some increase in size since 2020, diverticulosis, indeterminant ovoid cystic lesion in the pancreatic tail statistically favored to be a benign IPMN  Patient continues to complain of ongoing abdominal pain/discomfort.  She has not been taking pain medication regularly because this makes her nauseated she definitely feels worse after eating.  She says she is really not able to eat much of anything at all due to abdominal pain and nausea    Objective   Vital signs in last 24 hours: Temp:  [97.9 F (36.6 C)-98.9 F (37.2 C)] 98.1 F  (36.7 C) (12/01 0802) Pulse Rate:  [86-110] 108 (12/01 0802) Resp:  [14-20] 19 (12/01 0802) BP: (118-174)/(43-87) 151/43 (12/01 0802) SpO2:  [97 %-100 %] 100 % (12/01 0802) Weight:  [61.2 kg] 61.2 kg (12/01 0612) Last BM Date: 11/01/21 (per pt) General: Thin frail elderly African-American female in NAD Heart:  Regular rate and rhythm; no murmurs Lungs: Respirations even and unlabored, lungs CTA bilaterally Abdomen:  Soft,  Normal bowel sounds, rather generalized abdominal tenderness, no guarding or rebound Extremities:  Without edema. Neurologic:  Alert and oriented,  grossly normal neurologically. Psych:  Cooperative. Normal mood and affect.  Intake/Output from previous day: 11/30 0701 - 12/01 0700 In: 540 [P.O.:240; I.V.:300] Out: -  Intake/Output this shift: No intake/output data recorded.  Lab Results: Recent Labs    11/04/21 0210 11/05/21 0246 11/06/21 0244  WBC 5.3 7.4 6.2  HGB 11.1* 12.1 12.5  HCT 34.5* 37.9 39.9  PLT 117* 138* 141*   BMET Recent Labs    11/04/21 0210 11/05/21 0246 11/06/21 0244  NA 137 135 136  K 4.0 3.9 3.9  CL 110 106 106  CO2 22 23 21*  GLUCOSE 85 92 69*  BUN 15 10 12   CREATININE 1.26* 1.23* 1.27*  CALCIUM 8.8* 9.0 9.2   LFT Recent Labs    11/06/21 0244  PROT 6.0*  ALBUMIN 2.9*  AST 17  ALT 11  ALKPHOS 69  BILITOT 0.6   PT/INR No results for input(s): LABPROT, INR in the last 72 hours.  Studies/Results: VAS [place] MESENTERIC  Result Date: 11/05/2021 ABDOMINAL VISCERAL Patient  Name:  Linda [name]  Date of Exam:   11/05/2021 Medical Rec #: [id number]       Accession #:    [id number] Date of Birth: 10-05-1936        Patient Gender: F Patient Age:   64 years Exam [facility]  [facility] Procedure:      VAS [place] MESENTERIC Referring Phys: [place] -------------------------------------------------------------------------------- Indications: Weight loss, abdominal pain Limitations: Air/bowel gas. Comparison Study: No  prior studies. Performing Technologist: Darlin Coco RDMS, RVT  Examination Guidelines: A complete evaluation includes B-mode imaging, spectral Doppler, color Doppler, and power Doppler as needed of all accessible portions of each vessel. Bilateral testing is considered an integral part of a complete examination. Limited examinations for reoccurring indications may be performed as noted.  Duplex Findings: +----------------------+--------+--------+------+--------+ Mesenteric            PSV cm/sEDV cm/sPlaqueComments +----------------------+--------+--------+------+--------+ Aorta Mid                48                          +----------------------+--------+--------+------+--------+ Celiac Artery Origin    228                          +----------------------+--------+--------+------+--------+ Celiac Artery Proximal  211                          +----------------------+--------+--------+------+--------+ SMA Origin              173                          +----------------------+--------+--------+------+--------+ SMA Proximal            104                          +----------------------+--------+--------+------+--------+ SMA Mid                 131                          +----------------------+--------+--------+------+--------+ SMA Distal               85                          +----------------------+--------+--------+------+--------+ IMA                      55                          +----------------------+--------+--------+------+--------+    Summary: Mesenteric:  Mildly elevated velocities observed in the celiac axis. Patent SMA and IMA.  *See table(s) above for measurements and observations.  Diagnosing physician: Jamelle Haring  Electronically signed by Jamelle Haring on 11/05/2021 at 38:04:13 PM.    Final        Assessment / Plan:    #71 85 year old white female with 4 to 5-week history of persistent abdominal pain and nausea, worse  postprandially,, associated weight loss.  Suspect this is secondary to mesenteric ischemia, mesenteric artery duplex showing increased velocity in the celiac artery Vascular surgery to see for further evaluation and management May need CT angio which had been on hold due  to acute kidney injury which has improved   #2 left renal mass-concerning for indolent renal cell carcinoma, slightly increased in size since 2020  #3 small pancreatic tail cystic lesion most consistent with IPMN #4 malnutrition with significant weight loss-dietitian to see today She really has not been eating at all-would favor going ahead with placement of enteral feeding tube, this may also be helpful to see if she can tolerate enteral feedings or whether this may aggravate her abdominal pain which would further favor diagnosis of mesenteric ischemia/insufficiency    Plan; as above Will check prealbumin We will proceed with core track placement for enteral feedings Await vascular surgery consultation    Principal Problem:   Abdominal pain Active Problems:   Malignant neoplasm of female breast [facility])   Essential hypertension   Tobacco abuse   AKI (acute kidney injury) ([place])   HLD (hyperlipidemia)   Renal mass   Malnutrition of moderate degree   Decreased oral intake   Gastritis and gastroduodenitis     LOS: 5 days   Amy Esterwood PA-C 11/06/2021, 10:38 AM

## 2021-11-06 NOTE — Progress Notes (Signed)
Orders received for placement of cortrak. GI PA contacted regarding cortrak team being unavailable today. PA ok with placement on Friday morning (12/2).

## 2021-11-06 NOTE — Consult Note (Signed)
Hospital Consult    Reason for Consult: Concern for chronic mesenteric ischemia Referring Physician: Dr. Darrick Meigs MRN #:  [id number]  History of Present Illness: This is a 85 y.o. female with history of hypertension, hyperlipidemia, peripheral vascular disease who vascular surgery has been consulted with concern for chronic mesenteric ischemia.  Patient states she came to the hospital with about 3 to 4 weeks of postprandial abdominal pain.  States she has lost lost about 15 pounds in this time period.  No previous mesenteric interventions in the past.  She states her pain used to be after eating solids then after eating liquids and now it is all the time.  All over the abdomen.  Not focal.  GI performed EGD yesterday and they biopsied some white lesions in the esophagus and some erythematous lesions in the antrum.  Mesenteric duplex showed patent SMA and IMA with only mildly elevated velocity in celiac.  Previous right CEA by Dr. Trula Slade.    Past Medical History:  Diagnosis Date   Arthritis    Carotid artery disease ([place]) 12/18/2013   CONSTIPATION, CHRONIC 04/23/2008   Qualifier: Diagnosis of  By: Harlon Ditty CMA (AAMA), Dottie     Diverticulosis of colon (without mention of hemorrhage)    Essential hypertension 04/23/2008   Qualifier: Diagnosis of  By: Nelson-Smith CMA (AAMA), Dottie     GERD (gastroesophageal reflux disease)    HLD (hyperlipidemia) 08/15/2019   Malignant neoplasm of female breast ([place]) 04/23/2008   Qualifier: History of  By: Harlon Ditty CMA (AAMA), Dottie     Nail fungus    Neck pain 11/07/2019   Pancreatitis, acute 09/28/2014   Peripheral vascular disease ([place]) 02/24/2012   Post herpetic neuralgia 11/22/2018   Primary osteoarthritis of both knees 11/07/2019   Tobacco abuse 09/28/2014   Uterine fibroid     Past Surgical History:  Procedure Laterality Date   ABDOMINAL HYSTERECTOMY     BREAST LUMPECTOMY Right 2004   radiation and chemo   CHOLECYSTECTOMY  ~1980    COLONOSCOPY     Hx; of   ENDARTERECTOMY Right 01/19/2014   Procedure: ENDARTERECTOMY CAROTID;  Surgeon: Serafina Mitchell, MD;  Location: [facility] OR;  Service: Vascular;  Laterality: Right;   Right lumpectomy with right sentinel lymph node dissection  2004   Dr. Bubba Camp   VAGINAL HYSTERECTOMY      No Known Allergies  Prior to Admission medications   Medication Sig Start Date End Date Taking? Authorizing Provider  albuterol (VENTOLIN HFA) 108 (90 Base) MCG/ACT inhaler Inhale 1 puff into the lungs every 6 (six) hours as needed. 10/27/21  Yes [provider]  aspirin EC 81 MG tablet Take 1 tablet (81 mg total) by mouth daily. 02/24/12  Yes [name], MD  atorvastatin (LIPITOR) 40 MG tablet TAKE 1 TABLET BY MOUTH EACH EVENING Patient taking differently: Take 40 mg by mouth every evening. 04/04/20  Yes Luetta Nutting, DO  dexlansoprazole (DEXILANT) 60 MG capsule Take 1qd (Plz sched appt with new provider) Patient taking differently: Take 60 mg by mouth daily. 03/11/20  Yes Libby Maw, MD  gabapentin (NEURONTIN) 300 MG capsule TAKE 1 CAPSULE BY MOUTH EVERY DAY. No further refills. Schedule with Dustin Folks for follow up. Patient taking differently: Take 300 mg by mouth at bedtime. TAKE 1 CAPSULE BY MOUTH EVERY DAY. No further refills. Schedule with Dustin Folks for follow up. 06/18/20  Yes Libby Maw, MD  hydrALAZINE (APRESOLINE) 50 MG tablet Take 50 mg by mouth 3 (three) times  daily. 12/25/20  Yes [provider]  losartan (COZAAR) 25 MG tablet Take 25 mg by mouth daily. 10/08/20  Yes [provider]  mirtazapine (REMERON) 15 MG tablet Take 15 mg by mouth at bedtime. 09/22/21  Yes [provider]  ondansetron (ZOFRAN) 4 MG tablet Take 4 mg by mouth every 8 (eight) hours as needed. 09/22/21  Yes [provider]  verapamil (CALAN-SR) 120 MG CR tablet Take 240 mg by mouth in the morning. 09/16/21  Yes [provider]  omeprazole  (PRILOSEC) 20 MG capsule Take 1 capsule (20 mg total) by mouth daily. 12/10/11 02/24/12  Lisbeth Renshaw, PA-C    Social History   Socioeconomic History   Marital status: Single    Spouse name: Not on file   Number of children: 2   Years of education: Not on file   Highest education level: Not on file  Occupational History   Occupation: Retired  Tobacco Use   Smoking status: Every Day    Packs/day: 1.00    Years: 58.00    Pack years: 58.00    Types: Cigarettes   Smokeless tobacco: Never   Tobacco comments:    form given 12/14/11  Substance and Sexual Activity   Alcohol use: No    Alcohol/week: 0.0 standard drinks   Drug use: No   Sexual activity: Never  Other Topics Concern   Not on file  Social History Narrative   Not on file   Social Determinants of Health   Financial Resource Strain: Not on file  Food Insecurity: Not on file  Transportation Needs: Not on file  Physical Activity: Not on file  Stress: Not on file  Social Connections: Not on file  Intimate Partner Violence: Not on file     Family History  Problem Relation Age of Onset   Colon cancer Mother    Cancer Mother        Kidney   Pneumonia Father 64       Double   Colon cancer Maternal Grandmother     ROS: [x]  Positive   [ ]  Negative   [ ]  All sytems reviewed and are negative  Cardiovascular: []  chest pain/pressure []  palpitations []  SOB lying flat []  DOE []  pain in legs while walking []  pain in legs at rest []  pain in legs at night []  non-healing ulcers []  hx of DVT []  swelling in legs  Pulmonary: []  productive cough []  asthma/wheezing []  home O2  Neurologic: []  weakness in []  arms []  legs []  numbness in []  arms []  legs []  hx of CVA []  mini stroke [] difficulty speaking or slurred speech []  temporary loss of vision in one eye []  dizziness  Hematologic: []  hx of cancer []  bleeding problems []  problems with blood clotting easily  Endocrine:   []  diabetes []  thyroid  disease  GI []  vomiting blood []  blood in stool  GU: []  CKD/renal failure []  HD--[]  M/W/F or []  T/T/S []  burning with urination []  blood in urine  Psychiatric: []  anxiety []  depression  Musculoskeletal: []  arthritis []  joint pain  Integumentary: []  rashes []  ulcers  Constitutional: []  fever []  chills   Physical Examination  Vitals:   11/06/21 0436 11/06/21 0802  BP: (!) 145/65 (!) 151/43  Pulse: (!) 107 (!) 108  Resp: 14 19  Temp: 98.9 F (37.2 C) 98.1 F (36.7 C)  SpO2: 97% 100%   Body mass index is 25.48 kg/m.  General:  NAD Gait: Not observed HENT: WNL, normocephalic Pulmonary:  normal non-labored breathing Cardiac: regular, without  Murmurs, rubs or gallops Abdomen: soft, NT/ND, no masses, no rebound or guarding Vascular Exam/Pulses: Palpable femoral pulses bilateral No palpable pedal pulses Extremities: without ischemic changes, Musculoskeletal: no muscle wasting or atrophy  Neurologic: A&O X 3; Appropriate Affect ; SENSATION: normal; MOTOR FUNCTION:  moving all extremities equally. Speech is fluent/normal   CBC    Component Value Date/Time   WBC 6.2 11/06/2021 0244   RBC 4.85 11/06/2021 0244   HGB 12.5 11/06/2021 0244   HGB 14.8 04/21/2010 0909   HCT 39.9 11/06/2021 0244   HCT 44.0 04/21/2010 0909   PLT 141 (L) 11/06/2021 0244   PLT 166 04/21/2010 0909   MCV 82.3 11/06/2021 0244   MCV 84.7 04/21/2010 0909   MCH 25.8 (L) 11/06/2021 0244   MCHC 31.3 11/06/2021 0244   RDW 15.0 11/06/2021 0244   RDW 13.8 04/21/2010 0909   LYMPHSABS 1.8 10/31/2021 0955   LYMPHSABS 2.1 04/21/2010 0909   MONOABS 0.6 10/31/2021 0955   MONOABS 0.6 04/21/2010 0909   EOSABS 0.1 10/31/2021 0955   EOSABS 0.2 04/21/2010 0909   BASOSABS 0.1 10/31/2021 0955   BASOSABS 0.0 04/21/2010 0909    BMET    Component Value Date/Time   NA 136 11/06/2021 0244   K 3.9 11/06/2021 0244   CL 106 11/06/2021 0244   CO2 21 (L) 11/06/2021 0244   GLUCOSE 69 (L) 11/06/2021  0244   BUN 12 11/06/2021 0244   CREATININE 1.27 (H) 11/06/2021 0244   CREATININE 0.85 12/18/2013 1450   CALCIUM 9.2 11/06/2021 0244   GFRNONAA 42 (L) 11/06/2021 0244   GFRAA 53 (L) 06/20/2019 0430    COAGS: Lab Results  Component Value Date   INR 1.04 01/12/2014     Non-Invasive Vascular Imaging:    Mesenteric duplex yesterday showed mildly elevated velocities in the celiac artery with PSV 228 and patent SMA and IMA with no elevated velocities.  ASSESSMENT/PLAN: This is a 85 y.o. female admitted with about 3 to 4 weeks of postprandial abdominal pain and weight loss.  Vascular surgery has been consulted with concern for chronic mesenteric ischemia.  I reviewed the mesenteric duplex yesterday that shows only mildly elevated velocities in the celiac artery with what looks like a widely patent SMA and IMA.  There is no evidence of critical stenosis on the mesenteric duplex.  She does give a history of post-prandial abdominal pain and weight loss that could certainly be concerning for chronic mesenteric ischemia.  I have recommended CTA abdomen pelvis.  I did review her CT from 2015 and she had some mild calcification at the celiac and SMA origins but no obvious high-grade stenosis.  We will get updated imaging.  Vascular will follow.  Will review CTA and determine if any role for mesenteric intervention.  Marty Heck, MD [facility] Office: [place]

## 2021-11-07 ENCOUNTER — Inpatient Hospital Stay (HOSPITAL_COMMUNITY): Payer: Medicare Other

## 2021-11-07 DIAGNOSIS — E440 Moderate protein-calorie malnutrition: Secondary | ICD-10-CM | POA: Diagnosis not present

## 2021-11-07 DIAGNOSIS — R1013 Epigastric pain: Secondary | ICD-10-CM | POA: Diagnosis not present

## 2021-11-07 DIAGNOSIS — N179 Acute kidney failure, unspecified: Secondary | ICD-10-CM | POA: Diagnosis not present

## 2021-11-07 DIAGNOSIS — I10 Essential (primary) hypertension: Secondary | ICD-10-CM | POA: Diagnosis not present

## 2021-11-07 DIAGNOSIS — R109 Unspecified abdominal pain: Secondary | ICD-10-CM | POA: Diagnosis not present

## 2021-11-07 DIAGNOSIS — R638 Other symptoms and signs concerning food and fluid intake: Secondary | ICD-10-CM | POA: Diagnosis not present

## 2021-11-07 LAB — GLUCOSE, CAPILLARY
Glucose-Capillary: 83 mg/dL (ref 70–99)
Glucose-Capillary: 128 mg/dL — ABNORMAL HIGH (ref 70–99)

## 2021-11-07 LAB — PHOSPHORUS: Phosphorus: 4.1 mg/dL (ref 2.5–4.6)

## 2021-11-07 LAB — MAGNESIUM: Magnesium: 1.5 mg/dL — ABNORMAL LOW (ref 1.7–2.4)

## 2021-11-07 MED ORDER — IOHEXOL 350 MG/ML SOLN
70.0000 mL | Freq: Once | INTRAVENOUS | Status: AC | PRN
  Administered 2021-11-07: 70 mL via INTRAVENOUS

## 2021-11-07 MED ORDER — PROSOURCE TF PO LIQD
45.0000 mL | Freq: Two times a day (BID) | ORAL | Status: DC
  Administered 2021-11-07 – 2021-11-13 (×13): 45 mL
  Filled 2021-11-07 (×14): qty 45

## 2021-11-07 MED ORDER — PANCRELIPASE (LIP-PROT-AMYL) 12000-38000 UNITS PO CPEP
36000.0000 [IU] | ORAL_CAPSULE | Freq: Three times a day (TID) | ORAL | Status: DC
  Administered 2021-11-08 – 2021-11-21 (×31): 36000 [IU] via ORAL
  Filled 2021-11-07 (×36): qty 3

## 2021-11-07 MED ORDER — VITAL 1.5 CAL PO LIQD
1000.0000 mL | ORAL | Status: DC
  Administered 2021-11-07 – 2021-11-12 (×4): 1000 mL
  Filled 2021-11-07 (×8): qty 1000

## 2021-11-07 NOTE — Care Management Important Message (Signed)
Important Message  Patient Details  Name: Linda [name] MRN: [id number] Date of Birth: 01-May-1936   Medicare Important Message Given:  Yes     Hannah Beat 11/07/2021, 1:19 PM

## 2021-11-07 NOTE — TOC Initial Note (Addendum)
Transition of Care [facility]) - Initial/Assessment Note    Patient Details  Name: Linda [name] MRN: [id number] Date of Birth: 01-May-1936  [facility]) CM/SW Contact:    Sharin Mons, RN Phone Number: 11/07/2021, 8:23 AM  Clinical Narrative:    Presents with c/o abd pain and discomfort x 4-5 weeks..              From home with daughter April ( primary caregiver). PTA independent with ADL's. Walks with a cane. Daughter provides transportation to MD's appointments. PCP: Dustin Folks A JR   TOC team following and will assist with TOC needs.  11/07/2021 @ 1245 PT evaluation pending...  11/11/2021 @ 1000 NCM spoke with pt and daughter April ( speaker phone) regarding TOC needs. Shared PT's evaluation for SNF placement. Pt agreeable to  ST SNF placement once medically ready.Patient reported that her preference is to go home , however, understands she is currently unable to care for self independently at home given her current physical needs and fall risk. Patient reports no preference for SNF. NCM discussed insurance authorization process and provided Medicare SNF ratings list. Patient expressed being hopeful for rehab and to feel better soon. No further questions reported at this time. RNCM to continue to follow and assist with discharge planning needs.  Covid vaccinated with 2 boosters.  Expected Discharge Plan: Home/Self Care     Patient Goals and CMS Choice        Expected Discharge Plan and Services Expected Discharge Plan: Home/Self Care   Discharge Planning Services: CM Consult   Living arrangements for the past 2 months: Single Family Home                                      Prior Living Arrangements/Services Living arrangements for the past 2 months: Single Family Home Lives with:: Adult Children Patient language and need for interpreter reviewed:: Yes Do you feel safe going back to the place where you live?: Yes      Need for Family  Participation in Patient Care: Yes (Comment) Care giver support system in place?: Yes (comment) Current home services: DME (cane) Criminal Activity/Legal Involvement Pertinent to Current Situation/Hospitalization: No - Comment as needed  Activities of Daily Living Home Assistive Devices/Equipment: None ADL Screening (condition at time of admission) Patient's cognitive ability adequate to safely complete daily activities?: Yes Is the patient deaf or have difficulty hearing?: No Does the patient have difficulty seeing, even when wearing glasses/contacts?: No Does the patient have difficulty concentrating, remembering, or making decisions?: No Patient able to express need for assistance with ADLs?: Yes Does the patient have difficulty dressing or bathing?: No Independently performs ADLs?: Yes (appropriate for developmental age) Does the patient have difficulty walking or climbing stairs?: No Weakness of Legs: None Weakness of Arms/Hands: None  Permission Sought/Granted   Permission granted to share information with : Yes, Verbal Permission Granted  Share Information with NAME: April Holohan (Daughter) [phone]           Emotional Assessment Appearance:: Appears stated age Attitude/Demeanor/Rapport: Gracious Affect (typically observed): Accepting Orientation: : Oriented to Self, Oriented to Place, Oriented to  Time, Oriented to Situation Alcohol / Substance Use: Tobacco Use Psych Involvement: No (comment)  Admission diagnosis:  Renal dysfunction [N28.9] Decreased oral intake [R63.8] AKI (acute kidney injury) ([place]) [N17.9] Abdominal pain [R10.9] Patient Active Problem List   Diagnosis Date Noted  Gastritis and gastroduodenitis    Malnutrition of moderate degree 11/03/2021   Decreased oral intake    Renal mass 10/31/2021   Neck pain 11/07/2019   Primary osteoarthritis of both knees 11/07/2019   HLD (hyperlipidemia) 08/15/2019   Abdominal pain 06/16/2019   AKI (acute  kidney injury) ([place]) 06/15/2019   Lower abdominal pain 06/14/2019   Post herpetic neuralgia 11/22/2018   Cardiac murmur 05/16/2018   GERD (gastroesophageal reflux disease) 05/16/2018   Tobacco abuse 09/28/2014   Pancreatitis, acute 09/28/2014   Carotid artery disease ([place]) 12/18/2013   Peripheral vascular disease ([place]) 02/24/2012   Malignant neoplasm of female breast ([place]) 04/23/2008   Essential hypertension 04/23/2008   DIVERTICULOSIS OF COLON 04/23/2008   CONSTIPATION, CHRONIC 04/23/2008   PCP:  Sonia Side., FNP Pharmacy:   [id number] [address], [place] [address] Phone: [phone] Fax: [phone]     Social Determinants of Health (SDOH) Interventions    Readmission Risk Interventions No flowsheet data found.

## 2021-11-07 NOTE — Progress Notes (Signed)
[facility]  PROGRESS NOTE  Linda [name] NFA:[id number] DOB: October 31, 1936 DOA: 10/31/2021 PCP: Sonia Side., FNP   Brief HPI:   85 year old female with remote history of breast cancer, hypertension, dyslipidemia, peripheral vascular disease, questionable left renal mass came to ED with complaints of lower abdominal pain and discomfort for 4 to 5 weeks.  Worse after eating, poor p.o. intake and ongoing weight loss.  In the ED she was found to have elevated lipase 378, creatinine 1.7 CT abdomen showed left upper pole renal mass larger than 2020 and a cystic lesion in the pancreatic tail.  Gastroenterology was consulted, CT abdomen was being considered to rule out mesenteric ischemia which was deferred due to acute kidney injury.  Endoscopy on 11/30 was unrevealing.    Subjective   Patient continues to have abdominal pain.  Core track feeding tube placed today.  Dietitian consulted and patient started on NG tube feeding.  Appreciate vascular surgery consult.   Assessment/Plan:    Abdominal pain/weight loss -Unclear etiology -Mesenteric ischemia and consideration, mesenteric artery duplex shows increased velocity in the celiac artery -Gastroenterology recommends getting vascular surgery consult -Vascular surgery consulted, CT abdomen/pelvis ordered for this morning.  We will follow the recommendations. -Core track feeding tube placed, will be started on tube feed feedings as per dietitian.  Left renal mass -Larger as compared to imaging in 2020 -Concerning for renal cell carcinoma -She will need urgent referral to urology at discharge  Indeterminant  cystic lesion in pancreatic tail -Likely benign  Moderate protein calorie malnutrition -Patient reported significant weight loss due to above -Dietitian consulted  Acute kidney injury -Patient had creatinine 1.7 on admission -ARB was discontinued, creatinine improved with IV fluids  Hypertension -Blood pressure is  stable -Continue verapamil, hydralazine, amlodipine -Cozaar on hold  Remote history of breast cancer -Stable     Medications     amLODipine  10 mg Oral Daily   aspirin EC  81 mg Oral Daily   atorvastatin  40 mg Oral QPM   dicyclomine  10 mg Oral BID   enoxaparin (LOVENOX) injection  30 mg Subcutaneous Q24H   feeding supplement  1 Container Oral TID BM   gabapentin  300 mg Oral QHS   hydrALAZINE  75 mg Oral TID   mirtazapine  15 mg Oral QHS   multivitamin with minerals  1 tablet Oral Daily   nicotine  14 mg Transdermal Q24H   ondansetron  4 mg Oral Q6H   Or   ondansetron (ZOFRAN) IV  4 mg Intravenous Q6H   pantoprazole  40 mg Oral Daily   senna-docusate  1 tablet Oral BID   sucralfate  1 g Oral TID WC & HS   verapamil  240 mg Oral q AM     Data Reviewed:   CBG:  No results for input(s): GLUCAP in the last 168 hours.  SpO2: 96 %    Vitals:   11/06/21 0802 11/06/21 2110 11/07/21 0548 11/07/21 0637  BP: (!) 151/43 (!) 156/87 139/69   Pulse: (!) 108 86 89   Resp: 19 14 16    Temp: 98.1 F (36.7 C) 98.3 F (36.8 C) 98.1 F (36.7 C)   TempSrc:  Oral Oral   SpO2: 100% 98% 96%   Weight:    61.2 kg  Height:        No intake or output data in the 24 hours ending 11/07/21 1155   No intake/output data recorded.  Filed Weights   10/31/21  3875 11/06/21 0612 11/07/21 0637  Weight: 69.8 kg 61.2 kg 61.2 kg    Data Reviewed: Basic Metabolic Panel: Recent Labs  Lab 11/02/21 0122 11/03/21 0317 11/04/21 0210 11/05/21 0246 11/06/21 0244  NA 141 135 137 135 136  K 4.2 4.4 4.0 3.9 3.9  CL 117* 109 110 106 106  CO2 18* 22 22 23  21*  GLUCOSE 83 93 85 92 69*  BUN 34* 23 15 10 12   CREATININE 1.47* 1.34* 1.26* 1.23* 1.27*  CALCIUM 8.6* 8.7* 8.8* 9.0 9.2   Liver Function Tests: Recent Labs  Lab 11/02/21 0122 11/03/21 0317 11/04/21 0210 11/05/21 0246 11/06/21 0244  AST 19 20 18 18 17   ALT 11 10 11 11 11   ALKPHOS 60 63 68 70 69  BILITOT 0.7 0.4 0.8 0.8  0.6  PROT 5.0* 5.3* 5.3* 5.8* 6.0*  ALBUMIN 2.4* 2.5* 2.6* 2.8* 2.9*   Recent Labs  Lab 11/01/21 0159 11/03/21 0317  LIPASE 640* 480*   No results for input(s): AMMONIA in the last 168 hours. CBC: Recent Labs  Lab 11/02/21 0122 11/03/21 0317 11/04/21 0210 11/05/21 0246 11/06/21 0244  WBC 5.8 5.7 5.3 7.4 6.2  HGB 10.6* 11.4* 11.1* 12.1 12.5  HCT 34.6* 36.2 34.5* 37.9 39.9  MCV 85.2 83.6 82.5 82.6 82.3  PLT 101* 111* 117* 138* 141*   Cardiac Enzymes: No results for input(s): CKTOTAL, CKMB, CKMBINDEX, TROPONINI in the last 168 hours. BNP (last 3 results) No results for input(s): BNP in the last 8760 hours.  ProBNP (last 3 results) No results for input(s): PROBNP in the last 8760 hours.  CBG: No results for input(s): GLUCAP in the last 168 hours.     Radiology Reports  DG Abd Portable 1V  Result Date: 11/07/2021 CLINICAL DATA:  Feeding tube placement. EXAM: PORTABLE ABDOMEN - 1 VIEW COMPARISON:  None. FINDINGS: The bowel gas pattern is normal. Distal tip of feeding tube is seen in expected position of distal stomach. No radio-opaque calculi or other significant radiographic abnormality are seen. IMPRESSION: Distal tip of feeding tube is seen in expected position of distal stomach. Electronically Signed   By: Marijo Conception M.D.   On: 11/07/2021 10:21       Antibiotics: Anti-infectives (From admission, onward)    None         DVT prophylaxis: Lovenox  Code Status: DNR  Family Communication: No family at bedside   Consultants: Gastroenterology  Procedures: Mesenteric artery duplex    Objective    Physical Examination:   General-appears in no acute distress Heart-S1-S2, regular, no murmur auscultated Lungs-clear to auscultation bilaterally, no wheezing or crackles auscultated Abdomen-soft, mild epigastric tenderness to palpation Extremities-no edema in the lower extremities Neuro-alert, oriented x3, no focal deficit noted  Status is:  Inpatient  Dispo: The patient is from: Home              Anticipated d/c is to: Home              Anticipated d/c date is: 11/10/2021              Patient currently not stable for discharge  Barrier to discharge-ongoing evaluation for abdominal pain  COVID-19 Labs  No results for input(s): DDIMER, FERRITIN, LDH, CRP in the last 72 hours.  Lab Results  Component Value Date   SARSCOV2NAA NEGATIVE 10/31/2021   SARSCOV2NAA POSITIVE (A) 06/15/2019            Recent Results (from the past 240 hour(s))  Resp Panel by RT-PCR (Flu A&B, Covid) Nasopharyngeal Swab     Status: None   Collection Time: 10/31/21 10:00 AM   Specimen: Nasopharyngeal Swab; Nasopharyngeal(NP) swabs in vial transport medium  Result Value Ref Range Status   SARS Coronavirus 2 by RT PCR NEGATIVE NEGATIVE Final    Comment: (NOTE) SARS-CoV-2 target nucleic acids are NOT DETECTED.  The SARS-CoV-2 RNA is generally detectable in upper respiratory specimens during the acute phase of infection. The lowest concentration of SARS-CoV-2 viral copies this assay can detect is 138 copies/mL. A negative result does not preclude SARS-Cov-2 infection and should not be used as the sole basis for treatment or other patient management decisions. A negative result may occur with  improper specimen collection/handling, submission of specimen other than nasopharyngeal swab, presence of viral mutation(s) within the areas targeted by this assay, and inadequate number of viral copies(<138 copies/mL). A negative result must be combined with clinical observations, patient history, and epidemiological information. The expected result is Negative.  Fact Sheet for Patients:  EntrepreneurPulse.com.au  Fact Sheet for Healthcare Providers:  IncredibleEmployment.be  This test is no t yet approved or cleared by the [place] FDA and  has been authorized for detection and/or diagnosis of SARS-CoV-2  by FDA under an Emergency Use Authorization (EUA). This EUA will remain  in effect (meaning this test can be used) for the duration of the COVID-19 declaration under Section 564(b)(1) of the Act, 21 U.S.C.section 360bbb-3(b)(1), unless the authorization is terminated  or revoked sooner.       Influenza A by PCR NEGATIVE NEGATIVE Final   Influenza B by PCR NEGATIVE NEGATIVE Final    Comment: (NOTE) The Xpert Xpress SARS-CoV-2/FLU/RSV plus assay is intended as an aid in the diagnosis of influenza from Nasopharyngeal swab specimens and should not be used as a sole basis for treatment. Nasal washings and aspirates are unacceptable for Xpert Xpress SARS-CoV-2/FLU/RSV testing.  Fact Sheet for Patients: EntrepreneurPulse.com.au  Fact Sheet for Healthcare Providers: IncredibleEmployment.be  This test is not yet approved or cleared by the [place] FDA and has been authorized for detection and/or diagnosis of SARS-CoV-2 by FDA under an Emergency Use Authorization (EUA). This EUA will remain in effect (meaning this test can be used) for the duration of the COVID-19 declaration under Section 564(b)(1) of the Act, 21 U.S.C. section 360bbb-3(b)(1), unless the authorization is terminated or revoked.  Performed at [facility], [place] [address] [place], [place] [address]     [facility] If 7PM-7AM, please contact night-coverage at [email or web address], Office  [phone]   11/07/2021, 11:55 AM  LOS: 6 days

## 2021-11-07 NOTE — Progress Notes (Addendum)
[facility]  Subjective  - Sitting upright with continued belly pain.   Objective 139/69 89 98.1 F (36.7 C) (Oral) 16 96% No intake or output data in the 24 hours ending 11/07/21 0720  Lungs non labored breathing Motor intact in extremities, without ischemic changes Heart RRR Abdominal pain not reproduced with palpation    Assessment/Planning: This is a 85 y.o. female admitted with about 3 to 4 weeks of postprandial abdominal pain and weight loss.   CTA Abdomin/Pelvis ordered NPO for now may restart diet from a vascular point of view post CTA. CR 1.27 may need hydration pre and post angiogram per Primary team's recommendations.  Na/K= stable as well as hemoglobin.   Dr. Carlis Abbott will review CTA once it is complete and make recommendations.  Linda [name] 11/07/2021 7:20 AM --  Laboratory Lab Results: Recent Labs    11/05/21 0246 11/06/21 0244  WBC 7.4 6.2  HGB 12.1 12.5  HCT 37.9 39.9  PLT 138* 141*   BMET Recent Labs    11/05/21 0246 11/06/21 0244  NA 135 136  K 3.9 3.9  CL 106 106  CO2 23 21*  GLUCOSE 92 69*  BUN 10 12  CREATININE 1.23* 1.27*  CALCIUM 9.0 9.2    COAG Lab Results  Component Value Date   INR 1.04 01/12/2014   No results found for: PTT  I have seen and evaluated the patient. I agree with the PA note as documented above.  Vascular surgery was consulted yesterday with concern for chronic mesenteric ischemia.  As noted yesterday her mesenteric duplex is unremarkable.  SMA and IMA were widely patent with no elevated velocities.  She does give a history of postprandial abdominal pain with weight loss.  As discussed with hospitalist yesterday for further work-up would recommend CTA abdomen pelvis which we have ordered this morning given this was not ordered yesterday.  Marty Heck, MD [facility] Office: [phone]

## 2021-11-07 NOTE — Plan of Care (Signed)

## 2021-11-07 NOTE — Procedures (Signed)
Cortrak  Person Inserting Tube:  Esaw Dace, RD Tube Type:  Cortrak - 43 inches Tube Size:  10 Tube Location:  Left nare Initial Placement:  Stomach Secured by: Bridle Technique Used to Measure Tube Placement:  Marking at nare/corner of mouth Cortrak Secured At:  64 cm Procedure Comments:  Cortrak Tube Team Note:  Consult received to place a Cortrak feeding tube.   X-ray is required, abdominal x-ray has been ordered by the Cortrak team. Please confirm tube placement before using the Cortrak tube.   If the tube becomes dislodged please keep the tube and contact the Cortrak team at [email or web address] ([email or web address]) for replacement.  If after hours and replacement cannot be delayed, place a NG tube and confirm placement with an abdominal x-ray.    Kerman Passey MS, RDN, LDN, CNSC Registered Dietitian III Clinical Nutrition RD Pager and On-Call Pager Number Located in [place]

## 2021-11-07 NOTE — Progress Notes (Addendum)
Patient ID: Linda [name], female   DOB: 18-Jul-1936, 85 y.o.   MRN: [id number]    Attending physician's note   I have taken an interval history, reviewed the chart and examined the patient. I agree with the Advanced Practitioner's note, impression, and recommendations as outlined.   CTA to be completed today.  Core track placed and can use after CTA completion.  GI service will follow peripherally through the weekend.  Dr. Benson Norway will be covering her inpatient care.  Please do not hesitate to contact if additional questions or concerns.  Otherwise GI will see her again on Monday provided she is still inpatient.  [place], DO, FACG [phone] office         Progress Note   Subjective   Day # 7  CC : Persistent abdominal pain x5 to 6 weeks, worsened postprandially with associated weight loss.  Patient just had cortrack placed this morning, uncomfortable currently and continues to complain of abdominal discomfort.  Has not been eating  Tube feeds to start today Patient scheduled for CT angiography today  WBC 6.2/hemoglobin 12.2/hematocrit 39.9 BUN 12/creatinine 1.27 Prealbumin 11.9    Objective   Vital signs in last 24 hours: Temp:  [98.1 F (36.7 C)-98.3 F (36.8 C)] 98.1 F (36.7 C) (12/02 0548) Pulse Rate:  [86-89] 89 (12/02 0548) Resp:  [14-16] 16 (12/02 0548) BP: (139-156)/(69-87) 139/69 (12/02 0548) SpO2:  [96 %-98 %] 96 % (12/02 0548) Weight:  [61.2 kg] 61.2 kg (12/02 0637) Last BM Date: 11/01/21 General:    37 AA female  female in NAD  Heart:  Regular rate and rhythm; no murmurs Lungs: Respirations even and unlabored, lungs CTA bilaterally Abdomen:  Soft, mild rather diffuse tenderness and nondistended. Normal bowel sounds. Extremities:  Without edema. Neurologic:  Alert and oriented,  grossly normal neurologically. Psych:  Cooperative. Normal mood and affect.  Intake/Output from previous day: No intake/output data recorded. Intake/Output this  shift: No intake/output data recorded.  Lab Results: Recent Labs    11/05/21 0246 11/06/21 0244  WBC 7.4 6.2  HGB 12.1 12.5  HCT 37.9 39.9  PLT 138* 141*   BMET Recent Labs    11/05/21 0246 11/06/21 0244  NA 135 136  K 3.9 3.9  CL 106 106  CO2 23 21*  GLUCOSE 92 69*  BUN 10 12  CREATININE 1.23* 1.27*  CALCIUM 9.0 9.2   LFT Recent Labs    11/06/21 0244  PROT 6.0*  ALBUMIN 2.9*  AST 17  ALT 11  ALKPHOS 69  BILITOT 0.6   PT/INR No results for input(s): LABPROT, INR in the last 72 hours.  Studies/Results: VAS [place] MESENTERIC  Result Date: 11/05/2021 ABDOMINAL VISCERAL Patient Name:  TAKIA RUNYON Bugh  Date of Exam:   11/05/2021 Medical Rec #: [id number]       Accession #:    [id number] Date of Birth: 12-17-1935        Patient Gender: F Patient Age:   49 years Exam Location:  [facility] Procedure:      VAS [place] MESENTERIC Referring Phys: Zaleski -------------------------------------------------------------------------------- Indications: Weight loss, abdominal pain Limitations: Air/bowel gas. Comparison Study: No prior studies. Performing Technologist: Darlin Coco RDMS, RVT  Examination Guidelines: A complete evaluation includes B-mode imaging, spectral Doppler, color Doppler, and power Doppler as needed of all accessible portions of each vessel. Bilateral testing is considered an integral part of a complete examination. Limited examinations for reoccurring indications may be performed as noted.  Duplex Findings: +----------------------+--------+--------+------+--------+ Mesenteric            PSV cm/sEDV cm/sPlaqueComments +----------------------+--------+--------+------+--------+ Aorta Mid                48                          +----------------------+--------+--------+------+--------+ Celiac Artery Origin    228                          +----------------------+--------+--------+------+--------+ Celiac Artery Proximal  211                           +----------------------+--------+--------+------+--------+ SMA Origin              173                          +----------------------+--------+--------+------+--------+ SMA Proximal            104                          +----------------------+--------+--------+------+--------+ SMA Mid                 131                          +----------------------+--------+--------+------+--------+ SMA Distal               85                          +----------------------+--------+--------+------+--------+ IMA                      55                          +----------------------+--------+--------+------+--------+    Summary: Mesenteric:  Mildly elevated velocities observed in the celiac axis. Patent SMA and IMA.  *See table(s) above for measurements and observations.  Diagnosing physician: Jamelle Haring  Electronically signed by Jamelle Haring on 11/05/2021 at 35:04:13 PM.    Final        Assessment / Plan:    #57 85 year old white female with 4 to 5-week history of persistent abdominal pain, nausea postprandial exacerbation of pain and associated weight loss.  Concern for mesenteric ischemia as cause Mesenteric artery duplex showed increased velocity in the celiac artery  To undergo CT angiography of the abdomen pelvis today   #2 malnutrition secondary to above-core track placed today and to start low rate feeding. She may have exacerbation of her abdominal pain if pain is secondary to mesenteric ischemia and will need to go slow with advancing tube feedings  #3 small pancreatic tail cystic lesion most consistent with benign IPMN  #4 left renal mass concerning for indolent renal cell carcinoma slightly increased in size since 2020 #5 acute kidney injury-improved #6 remote history of breast cancer  Plan; await results of CT angiography and vascular surgery evaluation Start tube feedings today, will need to assess for her ability to tolerate She may still  have oral p.o.'s as tolerated as well.      Principal Problem:   Abdominal pain Active Problems:   Malignant neoplasm of female breast [facility])   Essential hypertension   Tobacco abuse  AKI (acute kidney injury) ([place])   HLD (hyperlipidemia)   Renal mass   Malnutrition of moderate degree   Decreased oral intake   Gastritis and gastroduodenitis     LOS: 6 days   Amy Esterwood  PA-C12/01/2021, 9:30 AM

## 2021-11-07 NOTE — Plan of Care (Signed)

## 2021-11-07 NOTE — Progress Notes (Signed)
Nutrition Brief Note  Pt was NPO this morning d/t ongoing abdominal pain. Current diet order is full liquid diet. Cortrak was placed and xray confirmed in distal stomach. Spoke with RN about initiation of tube feeding.   Vital 1.5 at 20 ml/h and increase by 75ml every 8 hours to goal rate of 50 ml/hr (1200 ml per day) Prosource TF 45 ml BID   Provides 1700 kcal, 94 gm protein, 917 ml free water daily  Labs and medications reviewed.   Estimated Nutritional Needs:  Kcal:  1650-1850 Protein:  85-100 grams Fluid:  >1.65 L  Wt Readings from Last 15 Encounters:  11/07/21 61.2 kg  05/08/20 69.8 kg  11/07/19 67.1 kg  08/15/19 68 kg  06/15/19 69.2 kg  06/14/19 68 kg  02/09/19 68 kg  01/11/19 67.1 kg  11/22/18 69.4 kg  05/16/18 67.3 kg  10/08/15 75.3 kg  08/26/15 75 kg  06/22/15 72.6 kg  09/29/14 72.9 kg  08/20/14 77.6 kg    Body mass index is 25.48 kg/m.    Clayborne Dana, RDN, LDN Clinical Nutrition

## 2021-11-07 NOTE — Progress Notes (Signed)
   11/07/21 1122  Mobility  Activity Refused mobility (Pt in pain. Declined for not feeling well)

## 2021-11-08 DIAGNOSIS — R1013 Epigastric pain: Secondary | ICD-10-CM | POA: Diagnosis not present

## 2021-11-08 DIAGNOSIS — R109 Unspecified abdominal pain: Secondary | ICD-10-CM

## 2021-11-08 LAB — PHOSPHORUS
Phosphorus: 3.4 mg/dL (ref 2.5–4.6)
Phosphorus: 2.7 mg/dL (ref 2.5–4.6)

## 2021-11-08 LAB — GLUCOSE, CAPILLARY
Glucose-Capillary: 107 mg/dL — ABNORMAL HIGH (ref 70–99)
Glucose-Capillary: 112 mg/dL — ABNORMAL HIGH (ref 70–99)
Glucose-Capillary: 119 mg/dL — ABNORMAL HIGH (ref 70–99)
Glucose-Capillary: 119 mg/dL — ABNORMAL HIGH (ref 70–99)
Glucose-Capillary: 120 mg/dL — ABNORMAL HIGH (ref 70–99)
Glucose-Capillary: 121 mg/dL — ABNORMAL HIGH (ref 70–99)
Glucose-Capillary: 96 mg/dL (ref 70–99)

## 2021-11-08 LAB — MAGNESIUM
Magnesium: 1.6 mg/dL — ABNORMAL LOW (ref 1.7–2.4)
Magnesium: 1.5 mg/dL — ABNORMAL LOW (ref 1.7–2.4)

## 2021-11-08 MED ORDER — OXYCODONE HCL 5 MG PO TABS
5.0000 mg | ORAL_TABLET | ORAL | Status: DC | PRN
  Administered 2021-11-08 – 2021-11-16 (×6): 5 mg via NASOGASTRIC
  Filled 2021-11-08 (×6): qty 1

## 2021-11-08 MED ORDER — DICYCLOMINE HCL 10 MG PO CAPS
10.0000 mg | ORAL_CAPSULE | Freq: Two times a day (BID) | ORAL | Status: DC
  Administered 2021-11-09 – 2021-11-18 (×21): 10 mg via NASOGASTRIC
  Filled 2021-11-08 (×23): qty 1

## 2021-11-08 MED ORDER — ADULT MULTIVITAMIN W/MINERALS CH
1.0000 | ORAL_TABLET | Freq: Every day | ORAL | Status: DC
  Administered 2021-11-09 – 2021-11-18 (×10): 1
  Filled 2021-11-08 (×10): qty 1

## 2021-11-08 MED ORDER — ACETAMINOPHEN 650 MG RE SUPP: 650.0000 mg | Freq: Four times a day (QID) | RECTAL | Status: DC | PRN

## 2021-11-08 MED ORDER — PANTOPRAZOLE SODIUM 40 MG IV SOLR
40.0000 mg | Freq: Two times a day (BID) | INTRAVENOUS | Status: DC
  Administered 2021-11-08 – 2021-11-09 (×3): 40 mg via INTRAVENOUS
  Filled 2021-11-08 (×3): qty 40

## 2021-11-08 MED ORDER — POLYETHYLENE GLYCOL 3350 17 G PO PACK: 17.0000 g | PACK | Freq: Every day | ORAL | Status: DC | PRN

## 2021-11-08 MED ORDER — ATORVASTATIN CALCIUM 40 MG PO TABS
40.0000 mg | ORAL_TABLET | Freq: Every day | ORAL | Status: DC
  Administered 2021-11-08 – 2021-11-18 (×11): 40 mg via NASOGASTRIC
  Filled 2021-11-08 (×11): qty 1

## 2021-11-08 MED ORDER — SUCRALFATE 1 G PO TABS
1.0000 g | ORAL_TABLET | Freq: Three times a day (TID) | ORAL | Status: DC
  Administered 2021-11-08 – 2021-11-18 (×41): 1 g via NASOGASTRIC
  Filled 2021-11-08 (×45): qty 1

## 2021-11-08 MED ORDER — LORAZEPAM 2 MG/ML IJ SOLN
1.0000 mg | Freq: Once | INTRAMUSCULAR | Status: AC
  Administered 2021-11-09: 15:00:00 1 mg via INTRAVENOUS
  Filled 2021-11-08: qty 1

## 2021-11-08 MED ORDER — ACETAMINOPHEN 325 MG PO TABS
650.0000 mg | ORAL_TABLET | Freq: Four times a day (QID) | ORAL | Status: DC | PRN
  Administered 2021-11-10 – 2021-11-17 (×5): 650 mg via NASOGASTRIC
  Filled 2021-11-08 (×5): qty 2

## 2021-11-08 MED ORDER — GABAPENTIN 300 MG PO CAPS
300.0000 mg | ORAL_CAPSULE | Freq: Every day | ORAL | Status: DC
Start: 2021-11-08 — End: 2021-11-19
  Administered 2021-11-09 – 2021-11-18 (×11): 300 mg
  Filled 2021-11-08 (×11): qty 1

## 2021-11-08 MED ORDER — DILTIAZEM 12 MG/ML ORAL SUSPENSION
30.0000 mg | Freq: Three times a day (TID) | ORAL | Status: DC
Start: 2021-11-08 — End: 2021-11-10
  Administered 2021-11-08 – 2021-11-10 (×4): 30 mg via ORAL
  Filled 2021-11-08 (×8): qty 3

## 2021-11-08 MED ORDER — MIRTAZAPINE 15 MG PO TABS
15.0000 mg | ORAL_TABLET | Freq: Every day | ORAL | Status: DC
Start: 2021-11-08 — End: 2021-11-19
  Administered 2021-11-09 – 2021-11-18 (×11): 15 mg via NASOGASTRIC
  Filled 2021-11-08 (×11): qty 1

## 2021-11-08 MED ORDER — SENNOSIDES-DOCUSATE SODIUM 8.6-50 MG PO TABS
1.0000 | ORAL_TABLET | Freq: Two times a day (BID) | ORAL | Status: DC
  Administered 2021-11-09 – 2021-11-18 (×16): 1 via NASOGASTRIC
  Filled 2021-11-08 (×19): qty 1

## 2021-11-08 NOTE — Progress Notes (Signed)
PROGRESS NOTE FOR [facility]  Subjective: No complaints.  The patient does not report any abdominal pain at this time.  Objective: Vital signs in last 24 hours: Temp:  [98 F (36.7 C)-98.4 F (36.9 C)] 98 F (36.7 C) (12/03 0759) Pulse Rate:  [92-103] 95 (12/03 0759) Resp:  [16-18] 18 (12/03 0759) BP: (98-148)/(46-81) 98/51 (12/03 0759) SpO2:  [97 %-100 %] 98 % (12/03 0759) Weight:  [59.6 kg] 59.6 kg (12/03 0427) Last BM Date:  (Patient unable to recall)  Intake/Output from previous day: 12/02 0701 - 12/03 0700 In: 522.5 [NG/GT:522.5] Out: -  Intake/Output this shift: No intake/output data recorded.  General appearance: alert and no distress GI: soft, non-tender; bowel sounds normal; no masses,  no organomegaly  Lab Results: Recent Labs    11/06/21 0244  WBC 6.2  HGB 12.5  HCT 39.9  PLT 141*   BMET Recent Labs    11/06/21 0244  NA 136  K 3.9  CL 106  CO2 21*  GLUCOSE 69*  BUN 12  CREATININE 1.27*  CALCIUM 9.2   LFT Recent Labs    11/06/21 0244  PROT 6.0*  ALBUMIN 2.9*  AST 17  ALT 11  ALKPHOS 69  BILITOT 0.6   PT/INR No results for input(s): LABPROT, INR in the last 72 hours. Hepatitis Panel No results for input(s): HEPBSAG, HCVAB, HEPAIGM, HEPBIGM in the last 72 hours. C-Diff No results for input(s): CDIFFTOX in the last 72 hours. Fecal Lactopherrin No results for input(s): FECLLACTOFRN in the last 72 hours.  Studies/Results: DG Abd Portable 1V  Result Date: 11/07/2021 CLINICAL DATA:  Feeding tube placement. EXAM: PORTABLE ABDOMEN - 1 VIEW COMPARISON:  None. FINDINGS: The bowel gas pattern is normal. Distal tip of feeding tube is seen in expected position of distal stomach. No radio-opaque calculi or other significant radiographic abnormality are seen. IMPRESSION: Distal tip of feeding tube is seen in expected position of distal stomach. Electronically Signed   By: Marijo Conception M.D.   On: 11/07/2021 10:21   CT Angio Abd/Pel w/ and/or  w/o  Result Date: 11/07/2021 CLINICAL DATA:  Abdominal pain, aortic dissection suspected. EXAM: CTA ABDOMEN AND PELVIS WITHOUT AND WITH CONTRAST TECHNIQUE: Multidetector CT imaging of the abdomen and pelvis was performed using the standard protocol during bolus administration of intravenous contrast. Multiplanar reconstructed images and MIPs were obtained and reviewed to evaluate the vascular anatomy. CONTRAST:  23mL OMNIPAQUE IOHEXOL 350 MG/ML SOLN COMPARISON:  CT abdomen/pelvis 10/31/2021 FINDINGS: VASCULAR Aorta: Normal in caliber. No evidence of aneurysm or dissection. Extensive mixed calcified and fibrofatty atherosclerotic plaque throughout the abdominal aorta. Celiac: Patent without evidence of aneurysm, dissection, vasculitis or significant stenosis. SMA: Patent without evidence of aneurysm, dissection, vasculitis or significant stenosis. Renals: Solitary renal arteries bilaterally. Calcified atherosclerotic plaque results in mild stenosis in the proximal renal arteries bilaterally. No aneurysm, dissection or evidence of fibromuscular dysplasia. IMA: Diminutive.  Suspect proximal stenosis. Inflow: No evidence of aneurysm, dissection, or occlusion. Extensive atherosclerotic plaque bilaterally. Probable focal significant stenosis in the distal left common iliac artery Proximal Outflow: Extensive atherosclerotic plaque with severe bilateral SFA disease likely occluded on the right and near occluded on the left. Veins: No focal venous abnormality. Review of the MIP images confirms the above findings. NON-VASCULAR Lower chest: No acute abnormality. Hepatobiliary: No focal liver abnormality is seen. Status post cholecystectomy. No biliary dilatation. Pancreas: Abnormal appearance of the pancreas with what appears to be multifocal segmental dilations of the main pancreatic duct in  the tail and body measuring up to 5-6 mm. Additionally, there is an ill-defined rounded low-attenuation lesion in the mid pancreas  measuring approximately 1.6 by 1.1 cm. Additionally, the pancreatic tail and body are slightly ill-defined and there is subtle inflammatory stranding in the peripancreatic fat. These findings suggest pancreatitis. Spleen: Normal in size without focal abnormality. Adrenals/Urinary Tract: No hydronephrosis. Multifocal low-attenuation renal lesions most consistent with simple cysts. Following administration of intravenous contrast, the intermediate density lesion exophytic from the upper pole of the left kidney demonstrates no convincing evidence of enhancement compared to the recent prior noncontrast enhanced CT scan. This is most consistent with a hemorrhagic or proteinaceous cyst. The ureters and bladder are unremarkable. Stomach/Bowel: Colonic diverticular disease without CT evidence of active inflammation. No evidence of obstruction or focal bowel wall thickening. Normal appendix in the right lower quadrant. The terminal ileum is unremarkable. Feeding tube tip lies in the gastric antrum. Lymphatic: No suspicious lymphadenopathy. Reproductive: Status post hysterectomy. No adnexal masses. Other: No abdominal wall hernia or abnormality. No abdominopelvic ascites. Musculoskeletal: No acute fracture or aggressive appearing lytic or blastic osseous lesion. Extensive bilateral lumbar facet arthropathy and multilevel degenerative disc disease. IMPRESSION: VASCULAR 1. No evidence of acute arterial occlusion, dissection or thrombosis. 2. No significant stenosis involving the visceral or renal arteries. 3. Extensive atherosclerotic plaque involving the aorta, branch vessels and lower extremity arteries. High-grade stenosis versus occlusion of the superficial femoral arteries bilaterally are likely chronic. NON-VASCULAR 1. Suspect acute pancreatitis with ill-defined edematous pancreatic tail and proximal body associated with inflammatory stranding in the peripancreatic fat. 2. Irregular appearance of the pancreatic duct with  apparent areas of segmental dilation and a questionable 1.6 cm low-attenuation lesion in the mid pancreas. Findings may reflect sequelae of chronic pancreatitis and a developing pancreatic pseudocyst. Pancreatic neoplasm is difficult to exclude entirely. After resolution of the patient's acute episode, a pancreas protocol MRI of the abdomen could be considered for further evaluation. 3. The enlarging intermediate density lesion exophytic from the upper pole of the left kidney demonstrates no evidence of enhancement on today's exam suggesting that this represents a proteinaceous or hemorrhagic cyst rather than a renal neoplasm. 4. The tip of the enteric feeding tube is within the gastric antrum. Recommend advancing if the intent is for transpyloric feeds. 5. Additional ancillary findings as above without significant interval change compared to recent prior imaging. Electronically Signed   By: Jacqulynn Cadet M.D.   On: 11/07/2021 13:55    Medications: Scheduled:  amLODipine  10 mg Oral Daily   aspirin EC  81 mg Oral Daily   atorvastatin  40 mg Oral QPM   dicyclomine  10 mg Oral BID   enoxaparin (LOVENOX) injection  30 mg Subcutaneous Q24H   feeding supplement (PROSource TF)  45 mL Per Tube BID   gabapentin  300 mg Oral QHS   hydrALAZINE  75 mg Oral TID   lipase/protease/amylase  36,000 Units Oral TID AC   mirtazapine  15 mg Oral QHS   multivitamin with minerals  1 tablet Oral Daily   nicotine  14 mg Transdermal Q24H   ondansetron  4 mg Oral Q6H   Or   ondansetron (ZOFRAN) IV  4 mg Intravenous Q6H   pantoprazole  40 mg Oral Daily   senna-docusate  1 tablet Oral BID   sucralfate  1 g Oral TID WC & HS   verapamil  240 mg Oral q AM   Continuous:  feeding supplement (VITAL 1.5 CAL) 1,000  mL (11/07/21 1633)    Assessment/Plan: 1) Abdominal pain of unknown etiology. 2) Probable IPMN.   The patient is currently without any pain.  She was started on Creon yesterday late afternoon.  Nursing  documented that she did not take any of her PO meds during that shift.  It is unclear if she received any Creon.  Plan: 1) Continue Creon. 2) Continue with supportive care.  LOS: 7 days   Daphine Loch D 11/08/2021, 8:26 AM

## 2021-11-08 NOTE — Progress Notes (Addendum)
   VASCULAR SURGERY ASSESSMENT & PLAN:   ABDOMINAL PAIN: I reviewed the patient's CT angiogram.  The celiac artery and superior mesenteric artery are widely patent.  The IMA is patent although small.  There is no evidence of chronic mesenteric ischemia.  Radiology interpreted the study is suggesting acute pancreatitis.  Vascular surgery will be available as needed.   SUBJECTIVE:   Still complaining of some abdominal pain.   PHYSICAL EXAM:   Vitals:   11/07/21 2101 11/08/21 0427 11/08/21 0445 11/08/21 0759  BP: (!) 141/81  (!) 113/46 (!) 98/51  Pulse: (!) 103  92 95  Resp: 16  16 18   Temp: 98 F (36.7 C)  98.2 F (36.8 C) 98 F (36.7 C)  TempSrc: Oral  Oral   SpO2: 100%  97% 98%  Weight:  59.6 kg    Height:       Abdomen with mild tenderness  LABS:   Lab Results  Component Value Date   WBC 6.2 11/06/2021   HGB 12.5 11/06/2021   HCT 39.9 11/06/2021   MCV 82.3 11/06/2021   PLT 141 (L) 11/06/2021   Lab Results  Component Value Date   CREATININE 1.27 (H) 11/06/2021   Lab Results  Component Value Date   INR 1.04 01/12/2014   CBG (last 3)  Recent Labs    11/08/21 0026 11/08/21 0442 11/08/21 0810  GLUCAP 107* 119* 120*    PROBLEM LIST:    Principal Problem:   Abdominal pain Active Problems:   Malignant neoplasm of female breast ([facility])   Essential hypertension   Tobacco abuse   AKI (acute kidney injury) ([facility])   HLD (hyperlipidemia)   Renal mass   Malnutrition of moderate degree   Decreased oral intake   Gastritis and gastroduodenitis   CURRENT MEDS:    amLODipine  10 mg Oral Daily   aspirin EC  81 mg Oral Daily   atorvastatin  40 mg Oral QPM   dicyclomine  10 mg Oral BID   enoxaparin (LOVENOX) injection  30 mg Subcutaneous Q24H   feeding supplement (PROSource TF)  45 mL Per Tube BID   gabapentin  300 mg Oral QHS   hydrALAZINE  75 mg Oral TID   lipase/protease/amylase  36,000 Units Oral TID AC   mirtazapine  15 mg Oral QHS   multivitamin with  minerals  1 tablet Oral Daily   nicotine  14 mg Transdermal Q24H   ondansetron  4 mg Oral Q6H   Or   ondansetron (ZOFRAN) IV  4 mg Intravenous Q6H   pantoprazole  40 mg Oral Daily   senna-docusate  1 tablet Oral BID   sucralfate  1 g Oral TID WC & HS   verapamil  240 mg Oral q AM    Deitra Mayo Office: [phone] 11/08/2021

## 2021-11-08 NOTE — Progress Notes (Signed)
Patient refused all her schedule PO medications throughout the shift. Patient remain alert and oriented. MD made aware.

## 2021-11-08 NOTE — Evaluation (Signed)
Physical Therapy Evaluation Patient Details Name: Linda [name] MRN: [id number] DOB: 07-Jan-1936 Today's Date: 11/08/2021  History of Present Illness  Pt is an 85 y.o. female admitted 11/25 for abdominal pain. PMH: remote breast cancer; HTN; HLD, and PVD  Clinical Impression  Pt admitted with above diagnosis. PTA pt lived at home with her daughter, mod I mobility using QC. On eval, pt required min guard assist bed mobility, min assist transfers, and min/HHA amb 10' with QC. Gait distance limited by weakness and pt feeling lightheaded. VSS. Pt felt better upon sitting in recliner. Pt currently with functional limitations due to the deficits listed below (see PT Problem List). Pt will benefit from skilled PT to increase their independence and safety with mobility to allow discharge to the venue listed below.          Recommendations for follow up therapy are one component of a multi-disciplinary discharge planning process, led by the attending physician.  Recommendations may be updated based on patient status, additional functional criteria and insurance authorization.  Follow Up Recommendations Home health PT    Assistance Recommended at Discharge Frequent or constant Supervision/Assistance  Functional Status Assessment Patient has had a recent decline in their functional status and demonstrates the ability to make significant improvements in function in a reasonable and predictable amount of time.  Equipment Recommendations  Rolling walker (2 wheels)    Recommendations for Other Services       Precautions / Restrictions Precautions Precautions: Other (comment) Precaution Comments: cortrak      Mobility  Bed Mobility Overal bed mobility: Needs Assistance Bed Mobility: Supine to Sit     Supine to sit: Min guard;HOB elevated     General bed mobility comments: increased time    Transfers Overall transfer level: Needs assistance Equipment used: Quad cane Transfers: Sit to/from  Stand Sit to Stand: Min assist           General transfer comment: increased time    Ambulation/Gait Ambulation/Gait assistance: Min Web designer (Feet): 10 Feet Assistive device: Quad cane;1 person hand held assist Gait Pattern/deviations: Step-through pattern;Decreased stride length;Trunk flexed Gait velocity: decreased     General Gait Details: QC in R hand and HHA on L. Distance limited by weakness and pt feeling lightheaded.  Stairs            Wheelchair Mobility    Modified Rankin (Stroke Patients Only)       Balance Overall balance assessment: Needs assistance Sitting-balance support: Feet supported;No upper extremity supported Sitting balance-Leahy Scale: Fair     Standing balance support: Bilateral upper extremity supported;During functional activity Standing balance-Leahy Scale: Poor Standing balance comment: reliant on external support                             Pertinent Vitals/Pain Pain Assessment: No/denies pain (Pt currently reporting no pain.)    Home Living Family/patient expects to be discharged to:: Private residence Living Arrangements: Children Available Help at Discharge: Family;Available PRN/intermittently Type of Home: House Home Access: Level entry       Home Layout: One level Home Equipment: Cane - quad;Toilet riser      Prior Function Prior Level of Function : Needs assist       Physical Assist : ADLs (physical)   ADLs (physical): Bathing Mobility Comments: amb mod I using quad cane ADLs Comments: daughter/niece assist with bathing     Hand Dominance   Dominant Hand: Right  Extremity/Trunk Assessment   Upper Extremity Assessment Upper Extremity Assessment: Generalized weakness    Lower Extremity Assessment Lower Extremity Assessment: Generalized weakness    Cervical / Trunk Assessment Cervical / Trunk Assessment: Kyphotic  Communication   Communication: No difficulties   Cognition Arousal/Alertness: Awake/alert Behavior During Therapy: WFL for tasks assessed/performed Overall Cognitive Status: Within Functional Limits for tasks assessed                                          General Comments General comments (skin integrity, edema, etc.): VSS on RA    Exercises     Assessment/Plan    PT Assessment Patient needs continued PT services  PT Problem List Decreased strength;Decreased mobility;Decreased activity tolerance;Decreased balance;Pain       PT Treatment Interventions Therapeutic activities;DME instruction;Gait training;Therapeutic exercise;Patient/family education;Balance training;Functional mobility training    PT Goals (Current goals can be found in the Care Plan section)  Acute Rehab PT Goals Patient Stated Goal: find out what's wrong PT Goal Formulation: With patient Time For Goal Achievement: 11/22/21 Potential to Achieve Goals: Good    Frequency Min 3X/week   Barriers to discharge        Co-evaluation               AM-PAC PT "6 Clicks" Mobility  Outcome Measure Help needed turning from your back to your side while in a flat bed without using bedrails?: A Little Help needed moving from lying on your back to sitting on the side of a flat bed without using bedrails?: A Little Help needed moving to and from a bed to a chair (including a wheelchair)?: A Little Help needed standing up from a chair using your arms (e.g., wheelchair or bedside chair)?: A Little Help needed to walk in hospital room?: A Little Help needed climbing 3-5 steps with a railing? : A Lot 6 Click Score: 17    End of Session Equipment Utilized During Treatment: Gait belt Activity Tolerance: Patient limited by fatigue Patient left: in chair;with call bell/phone within reach Nurse Communication: Mobility status PT Visit Diagnosis: Muscle weakness (generalized) (M62.81);Difficulty in walking, not elsewhere classified (R26.2)     Time: 1000-1013 PT Time Calculation (min) (ACUTE ONLY): 13 min   Charges:   PT Evaluation $PT Eval Low Complexity: 1 Low          Lorrin Goodell, PT  Office # [phone] Pager [phone]   [name] 11/08/2021, 12:22 PM

## 2021-11-08 NOTE — Plan of Care (Signed)
  Problem: Education: Goal: Knowledge of General Education information will improve Description: Including pain rating scale, medication(s)/side effects and non-pharmacologic comfort measures 11/08/2021 1146 by Lorayne Marek, RN Outcome: Progressing 11/08/2021 1146 by Lorayne Marek, RN Outcome: Progressing   Problem: Health Behavior/Discharge Planning: Goal: Ability to manage health-related needs will improve 11/08/2021 1146 by Lorayne Marek, RN Outcome: Progressing 11/08/2021 1146 by Lorayne Marek, RN Outcome: Progressing   Problem: Clinical Measurements: Goal: Ability to maintain clinical measurements within normal limits will improve 11/08/2021 1146 by Lorayne Marek, RN Outcome: Progressing 11/08/2021 1146 by Lorayne Marek, RN Outcome: Progressing Goal: Will remain free from infection 11/08/2021 1146 by Lorayne Marek, RN Outcome: Progressing 11/08/2021 1146 by Lorayne Marek, RN Outcome: Progressing Goal: Diagnostic test results will improve 11/08/2021 1146 by Lorayne Marek, RN Outcome: Progressing 11/08/2021 1146 by Lorayne Marek, RN Outcome: Progressing Goal: Respiratory complications will improve 11/08/2021 1146 by Lorayne Marek, RN Outcome: Progressing 11/08/2021 1146 by Lorayne Marek, RN Outcome: Progressing Goal: Cardiovascular complication will be avoided 11/08/2021 1146 by Lorayne Marek, RN Outcome: Progressing 11/08/2021 1146 by Lorayne Marek, RN Outcome: Progressing   Problem: Activity: Goal: Risk for activity intolerance will decrease 11/08/2021 1146 by Lorayne Marek, RN Outcome: Progressing 11/08/2021 1146 by Lorayne Marek, RN Outcome: Progressing   Problem: Nutrition: Goal: Adequate nutrition will be maintained 11/08/2021 1146 by Lorayne Marek, RN Outcome: Progressing 11/08/2021 1146 by Lorayne Marek, RN Outcome: Progressing   Problem: Coping: Goal: Level of anxiety will  decrease 11/08/2021 1146 by Lorayne Marek, RN Outcome: Progressing 11/08/2021 1146 by Lorayne Marek, RN Outcome: Progressing   Problem: Elimination: Goal: Will not experience complications related to bowel motility 11/08/2021 1146 by Lorayne Marek, RN Outcome: Progressing 11/08/2021 1146 by Lorayne Marek, RN Outcome: Progressing Goal: Will not experience complications related to urinary retention 11/08/2021 1146 by Lorayne Marek, RN Outcome: Progressing 11/08/2021 1146 by Lorayne Marek, RN Outcome: Progressing   Problem: Pain Managment: Goal: General experience of comfort will improve 11/08/2021 1146 by Lorayne Marek, RN Outcome: Progressing 11/08/2021 1146 by Lorayne Marek, RN Outcome: Progressing   Problem: Safety: Goal: Ability to remain free from injury will improve 11/08/2021 1146 by Lorayne Marek, RN Outcome: Progressing 11/08/2021 1146 by Lorayne Marek, RN Outcome: Progressing   Problem: Skin Integrity: Goal: Risk for impaired skin integrity will decrease 11/08/2021 1146 by Lorayne Marek, RN Outcome: Progressing 11/08/2021 1146 by Lorayne Marek, RN Outcome: Progressing

## 2021-11-08 NOTE — Progress Notes (Signed)
PROGRESS NOTE    Linda [name]  IOX:[id number] DOB: 04-18-1936 DOA: 10/31/2021 PCP: Sonia Side., FNP (   Brief Narrative:  85 year old female with remote history of breast cancer, hypertension, dyslipidemia, peripheral vascular disease, questionable left renal mass came to ED with complaints of lower abdominal pain and discomfort for 4 to 5 weeks.  Worse after eating, poor p.o. intake and ongoing weight loss.  In the ED she was found to have elevated lipase 378, creatinine 1.7 CT abdomen showed left upper pole renal mass larger than 2020 and a cystic lesion in the pancreatic tail.  Gastroenterology was consulted, CT abdomen was being considered to rule out mesenteric ischemia which was deferred due to acute kidney injury.  Endoscopy on 11/30 was unrevealing   Assessment & Plan:   Acute pancreatitis Abdominal pain with malnutrition unknown etiology Looks may be pancreatitis Vascular surgery in consult no chronic mesenteric ischemia based on the CTA GI consult upper endoscopy negative Continue creon Patient refusing medication NG tube started on feeding tub MRI of the abdomen pending patient needs Ativan to stay for the procedure  Left renal mass Larger as compared to imaging in 2020 -Concerning for renal cell carcinoma -She will need urgent referral to urology at discharge Await for MRI results   Indeterminant  cystic lesion in pancreatic tail -Likely benign   Moderate protein calorie malnutrition -Patient reported significant weight loss due to above -Dietitian consulted Started on feeding Started on vital with 20 cc/h will increase 10 cc every 8 hours to goal rate of 50 mL/h   Acute kidney injury -Patient had creatinine 1.7 on admission -ARB was discontinued, creatinine improved with IV fluids   Hypertension -Blood pressure is stable -Continue verapamil, hydralazine, amlodipine -Cozaar on hold   Remote    Principal Problem:   Abdominal pain Active  Problems:   Malignant neoplasm of female breast ([place])   Essential hypertension   Tobacco abuse   AKI (acute kidney injury) ([place])   HLD (hyperlipidemia)   Renal mass   Malnutrition of moderate degree   Decreased oral intake   Gastritis and gastroduodenitis      DVT prophylaxis: Lovenox 30 mg daily Code Status: DNR Family Communication: (Not family at the bedside Disposition Plan: Discharge in 3 to 5 days no stable no   Consultants:  Vascular surgery GI  Procedures: ( Upper endoscopy Mesenteric artery duplex, CTA abdomen pelvis  Antimicrobials:    Subjective: Patient looks clinically chronic pain Wants some answers why she is sick   Objective: Vitals:   11/08/21 0427 11/08/21 0445 11/08/21 0759 11/08/21 1207  BP:  (!) 113/46 (!) 98/51 (!) 145/76  Pulse:  92 95 95  Resp:  16 18 18   Temp:  98.2 F (36.8 C) 98 F (36.7 C) 97.9 F (36.6 C)  TempSrc:  Oral    SpO2:  97% 98% 97%  Weight: 59.6 kg     Height:        Intake/Output Summary (Last 24 hours) at 11/08/2021 1551 Last data filed at 11/08/2021 0300 Gross per 24 hour  Intake 522.5 ml  Output --  Net 522.5 ml   Filed Weights   11/06/21 0612 11/07/21 0637 11/08/21 0427  Weight: 61.2 kg 61.2 kg 59.6 kg    Examination:  General exam: Appears moderate distress Respiratory system: Clear to auscultation. Respiratory effort normal. Cardiovascular system: S1 & S2 heard, RRR. No JVD, murmurs, rubs, gallops or clicks. No pedal edema. Gastrointestinal system: Abdomen is nondistended, soft  and nontender. No organomegaly or masses felt. Normal bowel sounds heard. Central nervous system: Alert and oriented. No focal neurological deficits. Extremities: Symmetric 5 x 5 power. Skin: No rashes, lesions or ulcers Psychiatry: Judgement and insight appear normal. Mood & affect appropriate.     Data Reviewed: I have personally reviewed following labs and imaging studies  CBC: Recent Labs  Lab 11/02/21 0122  11/03/21 0317 11/04/21 0210 11/05/21 0246 11/06/21 0244  WBC 5.8 5.7 5.3 7.4 6.2  HGB 10.6* 11.4* 11.1* 12.1 12.5  HCT 34.6* 36.2 34.5* 37.9 39.9  MCV 85.2 83.6 82.5 82.6 82.3  PLT 101* 111* 117* 138* 638*   Basic Metabolic Panel: Recent Labs  Lab 11/02/21 0122 11/03/21 0317 11/04/21 0210 11/05/21 0246 11/06/21 0244 11/07/21 1641 11/08/21 0553  NA 141 135 137 135 136  --   --   K 4.2 4.4 4.0 3.9 3.9  --   --   CL 117* 109 110 106 106  --   --   CO2 18* 22 22 23  21*  --   --   GLUCOSE 83 93 85 92 69*  --   --   BUN 34* 23 15 10 12   --   --   CREATININE 1.47* 1.34* 1.26* 1.23* 1.27*  --   --   CALCIUM 8.6* 8.7* 8.8* 9.0 9.2  --   --   MG  --   --   --   --   --  1.5* 1.6*  PHOS  --   --   --   --   --  4.1 3.4   GFR: Estimated Creatinine Clearance: 27.8 mL/min (A) (by C-G formula based on SCr of 1.27 mg/dL (H)). Liver Function Tests: Recent Labs  Lab 11/02/21 0122 11/03/21 0317 11/04/21 0210 11/05/21 0246 11/06/21 0244  AST 19 20 18 18 17   ALT 11 10 11 11 11   ALKPHOS 60 63 68 70 69  BILITOT 0.7 0.4 0.8 0.8 0.6  PROT 5.0* 5.3* 5.3* 5.8* 6.0*  ALBUMIN 2.4* 2.5* 2.6* 2.8* 2.9*   Recent Labs  Lab 11/03/21 0317  LIPASE 480*   No results for input(s): AMMONIA in the last 168 hours. Coagulation Profile: No results for input(s): INR, PROTIME in the last 168 hours. Cardiac Enzymes: No results for input(s): CKTOTAL, CKMB, CKMBINDEX, TROPONINI in the last 168 hours. BNP (last 3 results) No results for input(s): PROBNP in the last 8760 hours. HbA1C: No results for input(s): HGBA1C in the last 72 hours. CBG: Recent Labs  Lab 11/07/21 2103 11/08/21 0026 11/08/21 0442 11/08/21 0810 11/08/21 1203  GLUCAP 128* 107* 119* 120* 119*   Lipid Profile: No results for input(s): CHOL, HDL, LDLCALC, TRIG, CHOLHDL, LDLDIRECT in the last 72 hours. Thyroid Function Tests: No results for input(s): TSH, T4TOTAL, FREET4, T3FREE, THYROIDAB in the last 72 hours. Anemia  Panel: No results for input(s): VITAMINB12, FOLATE, FERRITIN, TIBC, IRON, RETICCTPCT in the last 72 hours. Sepsis Labs: No results for input(s): PROCALCITON, LATICACIDVEN in the last 168 hours.  Recent Results (from the past 240 hour(s))  Resp Panel by RT-PCR (Flu A&B, Covid) Nasopharyngeal Swab     Status: None   Collection Time: 10/31/21 10:00 AM   Specimen: Nasopharyngeal Swab; Nasopharyngeal(NP) swabs in vial transport medium  Result Value Ref Range Status   SARS Coronavirus 2 by RT PCR NEGATIVE NEGATIVE Final    Comment: (NOTE) SARS-CoV-2 target nucleic acids are NOT DETECTED.  The SARS-CoV-2 RNA is generally detectable in upper respiratory specimens  during the acute phase of infection. The lowest concentration of SARS-CoV-2 viral copies this assay can detect is 138 copies/mL. A negative result does not preclude SARS-Cov-2 infection and should not be used as the sole basis for treatment or other patient management decisions. A negative result may occur with  improper specimen collection/handling, submission of specimen other than nasopharyngeal swab, presence of viral mutation(s) within the areas targeted by this assay, and inadequate number of viral copies(<138 copies/mL). A negative result must be combined with clinical observations, patient history, and epidemiological information. The expected result is Negative.  Fact Sheet for Patients:  EntrepreneurPulse.com.au  Fact Sheet for Healthcare Providers:  IncredibleEmployment.be  This test is no t yet approved or cleared by the [place] FDA and  has been authorized for detection and/or diagnosis of SARS-CoV-2 by FDA under an Emergency Use Authorization (EUA). This EUA will remain  in effect (meaning this test can be used) for the duration of the COVID-19 declaration under Section 564(b)(1) of the Act, 21 U.S.C.section 360bbb-3(b)(1), unless the authorization is terminated  or  revoked sooner.       Influenza A by PCR NEGATIVE NEGATIVE Final   Influenza B by PCR NEGATIVE NEGATIVE Final    Comment: (NOTE) The Xpert Xpress SARS-CoV-2/FLU/RSV plus assay is intended as an aid in the diagnosis of influenza from Nasopharyngeal swab specimens and should not be used as a sole basis for treatment. Nasal washings and aspirates are unacceptable for Xpert Xpress SARS-CoV-2/FLU/RSV testing.  Fact Sheet for Patients: EntrepreneurPulse.com.au  Fact Sheet for Healthcare Providers: IncredibleEmployment.be  This test is not yet approved or cleared by the [place] FDA and has been authorized for detection and/or diagnosis of SARS-CoV-2 by FDA under an Emergency Use Authorization (EUA). This EUA will remain in effect (meaning this test can be used) for the duration of the COVID-19 declaration under Section 564(b)(1) of the Act, 21 U.S.C. section 360bbb-3(b)(1), unless the authorization is terminated or revoked.  Performed at [facility], [place] [address], [place], [place] [address]          Radiology Studies: DG Abd Portable 1V  Result Date: 11/07/2021 CLINICAL DATA:  Feeding tube placement. EXAM: PORTABLE ABDOMEN - 1 VIEW COMPARISON:  None. FINDINGS: The bowel gas pattern is normal. Distal tip of feeding tube is seen in expected position of distal stomach. No radio-opaque calculi or other significant radiographic abnormality are seen. IMPRESSION: Distal tip of feeding tube is seen in expected position of distal stomach. Electronically Signed   By: Marijo Conception M.D.   On: 11/07/2021 10:21   CT Angio Abd/Pel w/ and/or w/o  Result Date: 11/07/2021 CLINICAL DATA:  Abdominal pain, aortic dissection suspected. EXAM: CTA ABDOMEN AND PELVIS WITHOUT AND WITH CONTRAST TECHNIQUE: Multidetector CT imaging of the abdomen and pelvis was performed using the standard protocol during bolus administration of intravenous contrast.  Multiplanar reconstructed images and MIPs were obtained and reviewed to evaluate the vascular anatomy. CONTRAST:  20mL OMNIPAQUE IOHEXOL 350 MG/ML SOLN COMPARISON:  CT abdomen/pelvis 10/31/2021 FINDINGS: VASCULAR Aorta: Normal in caliber. No evidence of aneurysm or dissection. Extensive mixed calcified and fibrofatty atherosclerotic plaque throughout the abdominal aorta. Celiac: Patent without evidence of aneurysm, dissection, vasculitis or significant stenosis. SMA: Patent without evidence of aneurysm, dissection, vasculitis or significant stenosis. Renals: Solitary renal arteries bilaterally. Calcified atherosclerotic plaque results in mild stenosis in the proximal renal arteries bilaterally. No aneurysm, dissection or evidence of fibromuscular dysplasia. IMA: Diminutive.  Suspect proximal stenosis. Inflow: No  evidence of aneurysm, dissection, or occlusion. Extensive atherosclerotic plaque bilaterally. Probable focal significant stenosis in the distal left common iliac artery Proximal Outflow: Extensive atherosclerotic plaque with severe bilateral SFA disease likely occluded on the right and near occluded on the left. Veins: No focal venous abnormality. Review of the MIP images confirms the above findings. NON-VASCULAR Lower chest: No acute abnormality. Hepatobiliary: No focal liver abnormality is seen. Status post cholecystectomy. No biliary dilatation. Pancreas: Abnormal appearance of the pancreas with what appears to be multifocal segmental dilations of the main pancreatic duct in the tail and body measuring up to 5-6 mm. Additionally, there is an ill-defined rounded low-attenuation lesion in the mid pancreas measuring approximately 1.6 by 1.1 cm. Additionally, the pancreatic tail and body are slightly ill-defined and there is subtle inflammatory stranding in the peripancreatic fat. These findings suggest pancreatitis. Spleen: Normal in size without focal abnormality. Adrenals/Urinary Tract: No hydronephrosis.  Multifocal low-attenuation renal lesions most consistent with simple cysts. Following administration of intravenous contrast, the intermediate density lesion exophytic from the upper pole of the left kidney demonstrates no convincing evidence of enhancement compared to the recent prior noncontrast enhanced CT scan. This is most consistent with a hemorrhagic or proteinaceous cyst. The ureters and bladder are unremarkable. Stomach/Bowel: Colonic diverticular disease without CT evidence of active inflammation. No evidence of obstruction or focal bowel wall thickening. Normal appendix in the right lower quadrant. The terminal ileum is unremarkable. Feeding tube tip lies in the gastric antrum. Lymphatic: No suspicious lymphadenopathy. Reproductive: Status post hysterectomy. No adnexal masses. Other: No abdominal wall hernia or abnormality. No abdominopelvic ascites. Musculoskeletal: No acute fracture or aggressive appearing lytic or blastic osseous lesion. Extensive bilateral lumbar facet arthropathy and multilevel degenerative disc disease. IMPRESSION: VASCULAR 1. No evidence of acute arterial occlusion, dissection or thrombosis. 2. No significant stenosis involving the visceral or renal arteries. 3. Extensive atherosclerotic plaque involving the aorta, branch vessels and lower extremity arteries. High-grade stenosis versus occlusion of the superficial femoral arteries bilaterally are likely chronic. NON-VASCULAR 1. Suspect acute pancreatitis with ill-defined edematous pancreatic tail and proximal body associated with inflammatory stranding in the peripancreatic fat. 2. Irregular appearance of the pancreatic duct with apparent areas of segmental dilation and a questionable 1.6 cm low-attenuation lesion in the mid pancreas. Findings may reflect sequelae of chronic pancreatitis and a developing pancreatic pseudocyst. Pancreatic neoplasm is difficult to exclude entirely. After resolution of the patient's acute episode, a  pancreas protocol MRI of the abdomen could be considered for further evaluation. 3. The enlarging intermediate density lesion exophytic from the upper pole of the left kidney demonstrates no evidence of enhancement on today's exam suggesting that this represents a proteinaceous or hemorrhagic cyst rather than a renal neoplasm. 4. The tip of the enteric feeding tube is within the gastric antrum. Recommend advancing if the intent is for transpyloric feeds. 5. Additional ancillary findings as above without significant interval change compared to recent prior imaging. Electronically Signed   By: Jacqulynn Cadet M.D.   On: 11/07/2021 13:55        Scheduled Meds:  atorvastatin  40 mg Per NG tube Daily   dicyclomine  10 mg Per NG tube BID   diltiazem  30 mg Oral Q8H   enoxaparin (LOVENOX) injection  30 mg Subcutaneous Q24H   feeding supplement (PROSource TF)  45 mL Per Tube BID   gabapentin  300 mg Per Tube QHS   lipase/protease/amylase  36,000 Units Oral TID AC   LORazepam  1 mg  Intravenous Once   mirtazapine  15 mg Per NG tube QHS   [START ON 11/09/2021] multivitamin with minerals  1 tablet Per Tube Daily   nicotine  14 mg Transdermal Q24H   ondansetron  4 mg Oral Q6H   Or   ondansetron (ZOFRAN) IV  4 mg Intravenous Q6H   pantoprazole (PROTONIX) IV  40 mg Intravenous Q12H   senna-docusate  1 tablet Per NG tube BID   sucralfate  1 g Per NG tube TID WC & HS   Continuous Infusions:  feeding supplement (VITAL 1.5 CAL) 1,000 mL (11/07/21 1633)     LOS: 7 days    Time spent: More than 35 minutes    Keil Pickering G Xin Klawitter, MD [facility]   If 7PM-7AM, please contact night-coverage [email or web address] Password [facility] 11/08/2021, 3:51 PM

## 2021-11-09 ENCOUNTER — Inpatient Hospital Stay (HOSPITAL_COMMUNITY): Payer: Medicare Other

## 2021-11-09 DIAGNOSIS — R1013 Epigastric pain: Secondary | ICD-10-CM | POA: Diagnosis not present

## 2021-11-09 LAB — CBC
HCT: 33 % — ABNORMAL LOW (ref 36.0–46.0)
Hemoglobin: 10.5 g/dL — ABNORMAL LOW (ref 12.0–15.0)
MCH: 26.6 pg (ref 26.0–34.0)
MCHC: 31.8 g/dL (ref 30.0–36.0)
MCV: 83.5 fL (ref 80.0–100.0)
Platelets: 122 K/uL — ABNORMAL LOW (ref 150–400)
RBC: 3.95 MIL/uL (ref 3.87–5.11)
RDW: 14.6 % (ref 11.5–15.5)
WBC: 8.2 K/uL (ref 4.0–10.5)
nRBC: 0 % (ref 0.0–0.2)

## 2021-11-09 LAB — COMPREHENSIVE METABOLIC PANEL WITH GFR
ALT: 10 U/L (ref 0–44)
AST: 16 U/L (ref 15–41)
Albumin: 2.3 g/dL — ABNORMAL LOW (ref 3.5–5.0)
Alkaline Phosphatase: 49 U/L (ref 38–126)
Anion gap: 10 (ref 5–15)
BUN: 29 mg/dL — ABNORMAL HIGH (ref 8–23)
CO2: 18 mmol/L — ABNORMAL LOW (ref 22–32)
Calcium: 8.5 mg/dL — ABNORMAL LOW (ref 8.9–10.3)
Chloride: 104 mmol/L (ref 98–111)
Creatinine, Ser: 1.39 mg/dL — ABNORMAL HIGH (ref 0.44–1.00)
GFR, Estimated: 38 mL/min — ABNORMAL LOW
Glucose, Bld: 123 mg/dL — ABNORMAL HIGH (ref 70–99)
Potassium: 3.7 mmol/L (ref 3.5–5.1)
Sodium: 132 mmol/L — ABNORMAL LOW (ref 135–145)
Total Bilirubin: 0.2 mg/dL — ABNORMAL LOW (ref 0.3–1.2)
Total Protein: 5.4 g/dL — ABNORMAL LOW (ref 6.5–8.1)

## 2021-11-09 LAB — GLUCOSE, CAPILLARY
Glucose-Capillary: 121 mg/dL — ABNORMAL HIGH (ref 70–99)
Glucose-Capillary: 115 mg/dL — ABNORMAL HIGH (ref 70–99)
Glucose-Capillary: 99 mg/dL (ref 70–99)
Glucose-Capillary: 87 mg/dL (ref 70–99)
Glucose-Capillary: 116 mg/dL — ABNORMAL HIGH (ref 70–99)

## 2021-11-09 LAB — PHOSPHORUS: Phosphorus: 2.8 mg/dL (ref 2.5–4.6)

## 2021-11-09 LAB — MAGNESIUM: Magnesium: 1.4 mg/dL — ABNORMAL LOW (ref 1.7–2.4)

## 2021-11-09 MED ORDER — SODIUM CHLORIDE 0.9 % IV SOLN
INTRAVENOUS | Status: DC
Start: 2021-11-09 — End: 2021-11-18

## 2021-11-09 MED ORDER — PANTOPRAZOLE 2 MG/ML SUSPENSION
40.0000 mg | Freq: Every day | ORAL | Status: DC
  Filled 2021-11-09: qty 20

## 2021-11-09 MED ORDER — PANTOPRAZOLE 2 MG/ML SUSPENSION
40.0000 mg | Freq: Every day | ORAL | Status: DC
  Administered 2021-11-10 – 2021-11-19 (×10): 40 mg
  Filled 2021-11-09 (×10): qty 20

## 2021-11-09 MED ORDER — MAGNESIUM SULFATE 2 GM/50ML IV SOLN
2.0000 g | Freq: Once | INTRAVENOUS | Status: AC
  Administered 2021-11-09: 18:00:00 2 g via INTRAVENOUS
  Filled 2021-11-09: qty 50

## 2021-11-09 NOTE — Progress Notes (Signed)
RN spoke with Charge RN regarding patient routine MRI order that is planned to be done by 0245 this am. The concern is that the Small bore TF (Cortrak) needs to be taken out prior to the MRI for safety purposes. The patient's medications are given via TF and medications have already been delayed in preparation to the MRI in which patient should be NPO four hours prior to the MRI of the abdomen. RN and charge RN agreed to notify the on call Hospitalist regarding the situation.

## 2021-11-09 NOTE — Progress Notes (Signed)
Charge RN notified Dr. Lovey Newcomer on call Hospitalist regarding the MRI of the abdomen that the  Cortrak needs to be taken out prior to the MRI. Dr. Kennon Holter decided to wait until the am for the MRI to be done because the Cortrak are available by then.

## 2021-11-09 NOTE — Progress Notes (Signed)
Routine MRI abdomen ordered today. NPO started at 2000 by Arnaldo Natal, RN in anticipation of examination. Per MRI tech, patient unable to undergo MRI with Cortrak in place, Cortrak would need to be removed prior to exam. Patient has been receiving medications and feedings via tube. Jeannette Corpus, NP notified of need to remove Cortrak prior to examination and inability to replace Cortrak immediately after exam as Cortrak Team unavailable until 0800 - states to hold off on examination until AM.

## 2021-11-09 NOTE — Progress Notes (Signed)
Pt brought down to MRI for exam via pt transport. Pt premedicated prior to coming down by RN. Pt safety screened prior to exam with daughter verbally. Pt prepared for exam and began scanning. Pt continually removed body coil required for imaging and frequently lifted whole body, shifting position several times while also reaching out of the scanner. Attempted to verbally redirect pt to no avail. Unable to obtain diagnostic images in pt's current state. RN made aware, unable to provide further meds at this time per RN. Pt sent back to room via pt transport.

## 2021-11-09 NOTE — Progress Notes (Signed)
Medications were given late due to awaiting MRI and patient has to be NPO for four hours prior to MRI abdomen.

## 2021-11-09 NOTE — Progress Notes (Addendum)
PROGRESS NOTE    Linda [name]  XBD:[id number] DOB: September 23, 1936 DOA: 10/31/2021 PCP: Sonia Side., FNP    Brief Narrative:  85 year old female with remote history of breast cancer, hypertension, dyslipidemia, peripheral vascular disease, questionable left renal mass came to ED with complaints of lower abdominal pain and discomfort for 4 to 5 weeks.  Worse after eating, poor p.o. intake and ongoing weight loss.  In the ED she was found to have elevated lipase 378, creatinine 1.7 CT abdomen showed left upper pole renal mass larger than 2020 and a cystic lesion in the pancreatic tail.  Gastroenterology was consulted, CT abdomen was being considered to rule out mesenteric ischemia which was deferred due to acute kidney injury.  Endoscopy on 11/30 was unrevealing  Not able to tolerate the MRI  Assessment & Plan:  Acute on chronic pancreatitis Abdominal pain with malnutrition unknown etiology No pain today Looks may be pancreatitis Vascular surgery in consult no chronic mesenteric ischemia based on the CTA GI consult upper endoscopy negative Continue creon Patient refusing medication NG tube started on feeding tub MRI of the abdomen pending patient needs Ativan to stay for the  MRI pending need to hold the feeding tube for 4 hours before MRI   Malnutrition Continue feeding tube Advance to regular diet per GI Continue with Creon   Left renal mass Larger as compared to imaging in 2020 -Concerning for renal cell carcinoma -She will need urgent referral to urology at discharge Await for MRI results   Indeterminant  cystic lesion in pancreatic tail -Likely benign   Moderate protein calorie malnutrition -Patient reported significant weight loss due to above -Dietitian consulted Started on feeding Started on vital with 20 cc/h will increase 10 cc every 8 hours to goal rate of 50 mL/h   Acute kidney injury -Patient had creatinine 1.7 on admission -ARB was discontinued,  creatinine improved with IV fluids  11/09/2021 Increase BUN and creatinine Add IV fluids normal saline Repeat BUN/creatinine in the morning    Hypertension -Blood pressure is stable -Continue verapamil, hydralazine, amlodipine -Cozaar on hold   Principal Problem:   Abdominal pain Active Problems:   Malignant neoplasm of female breast ([place])   Essential hypertension   Tobacco abuse   AKI (acute kidney injury) ()   HLD (hyperlipidemia)   Renal mass   Malnutrition of moderate degree   Decreased oral intake   Gastritis and gastroduodenitis    DVT prophylaxis: Lovenox 30 mg daily Code Status: DNR Family Communication: (Not family at the bedside Disposition Plan: Discharge in 3 to 5 days no stable no     Consultants:  Vascular surgery GI   Procedures: ( Upper endoscopy Mesenteric artery duplex, CTA abdomen pelvis   Antimicrobials:   Subjective: No abdominal pain  Objective: Vitals:   11/08/21 2046 11/08/21 2356 11/09/21 0401 11/09/21 0833  BP: (!) 141/54 (!) 158/75 140/60 (!) 112/53  Pulse: 87 100 91 95  Resp: 16 16 16 18   Temp: 98.3 F (36.8 C) 99 F (37.2 C) 99.8 F (37.7 C) 98 F (36.7 C)  TempSrc: Oral Oral Oral   SpO2: 96% 93% 96% 94%  Weight:      Height:        Intake/Output Summary (Last 24 hours) at 11/09/2021 1523 Last data filed at 11/09/2021 0500 Gross per 24 hour  Intake 0 ml  Output 510 ml  Net -510 ml   Filed Weights   11/06/21 0612 11/07/21 0637 11/08/21 0427  Weight: 61.2  kg 61.2 kg 59.6 kg    Examination:  General exam: Appears calm and comfortable  Respiratory system: Clear to auscultation. Respiratory effort normal. Cardiovascular system: S1 & S2 heard, RRR. No JVD, murmurs, rubs, gallops or clicks. No pedal edema. Gastrointestinal system: Abdomen is nondistended, soft and nontender. No organomegaly or masses felt. Normal bowel sounds heard. Central nervous system: Alert and oriented. No focal neurological  deficits. Extremities: Symmetric 5 x 5 power. Skin: No rashes, lesions or ulcers Psychiatry: Judgement and insight appear normal. Mood & affect appropriate.     Data Reviewed: I have personally reviewed following labs and imaging studies  CBC: Recent Labs  Lab 11/03/21 0317 11/04/21 0210 11/05/21 0246 11/06/21 0244 11/09/21 0727  WBC 5.7 5.3 7.4 6.2 8.2  HGB 11.4* 11.1* 12.1 12.5 10.5*  HCT 36.2 34.5* 37.9 39.9 33.0*  MCV 83.6 82.5 82.6 82.3 83.5  PLT 111* 117* 138* 141* 229*   Basic Metabolic Panel: Recent Labs  Lab 11/03/21 0317 11/04/21 0210 11/05/21 0246 11/06/21 0244 11/07/21 1641 11/08/21 0553 11/08/21 1609 11/09/21 0727  NA 135 137 135 136  --   --   --  132*  K 4.4 4.0 3.9 3.9  --   --   --  3.7  CL 109 110 106 106  --   --   --  104  CO2 22 22 23  21*  --   --   --  18*  GLUCOSE 93 85 92 69*  --   --   --  123*  BUN 23 15 10 12   --   --   --  29*  CREATININE 1.34* 1.26* 1.23* 1.27*  --   --   --  1.39*  CALCIUM 8.7* 8.8* 9.0 9.2  --   --   --  8.5*  MG  --   --   --   --  1.5* 1.6* 1.5* 1.4*  PHOS  --   --   --   --  4.1 3.4 2.7 2.8   GFR: Estimated Creatinine Clearance: 25.4 mL/min (A) (by C-G formula based on SCr of 1.39 mg/dL (H)). Liver Function Tests: Recent Labs  Lab 11/03/21 0317 11/04/21 0210 11/05/21 0246 11/06/21 0244 11/09/21 0727  AST 20 18 18 17 16   ALT 10 11 11 11 10   ALKPHOS 63 68 70 69 49  BILITOT 0.4 0.8 0.8 0.6 0.2*  PROT 5.3* 5.3* 5.8* 6.0* 5.4*  ALBUMIN 2.5* 2.6* 2.8* 2.9* 2.3*   Recent Labs  Lab 11/03/21 0317  LIPASE 480*   No results for input(s): AMMONIA in the last 168 hours. Coagulation Profile: No results for input(s): INR, PROTIME in the last 168 hours. Cardiac Enzymes: No results for input(s): CKTOTAL, CKMB, CKMBINDEX, TROPONINI in the last 168 hours. BNP (last 3 results) No results for input(s): PROBNP in the last 8760 hours. HbA1C: No results for input(s): HGBA1C in the last 72 hours. CBG: Recent Labs   Lab 11/08/21 2044 11/08/21 2346 11/09/21 0358 11/09/21 0830 11/09/21 1205  GLUCAP 112* 96 121* 115* 99   Lipid Profile: No results for input(s): CHOL, HDL, LDLCALC, TRIG, CHOLHDL, LDLDIRECT in the last 72 hours. Thyroid Function Tests: No results for input(s): TSH, T4TOTAL, FREET4, T3FREE, THYROIDAB in the last 72 hours. Anemia Panel: No results for input(s): VITAMINB12, FOLATE, FERRITIN, TIBC, IRON, RETICCTPCT in the last 72 hours. Sepsis Labs: No results for input(s): PROCALCITON, LATICACIDVEN in the last 168 hours.  Recent Results (from the past 240 hour(s))  Resp  Panel by RT-PCR (Flu A&B, Covid) Nasopharyngeal Swab     Status: None   Collection Time: 10/31/21 10:00 AM   Specimen: Nasopharyngeal Swab; Nasopharyngeal(NP) swabs in vial transport medium  Result Value Ref Range Status   SARS Coronavirus 2 by RT PCR NEGATIVE NEGATIVE Final    Comment: (NOTE) SARS-CoV-2 target nucleic acids are NOT DETECTED.  The SARS-CoV-2 RNA is generally detectable in upper respiratory specimens during the acute phase of infection. The lowest concentration of SARS-CoV-2 viral copies this assay can detect is 138 copies/mL. A negative result does not preclude SARS-Cov-2 infection and should not be used as the sole basis for treatment or other patient management decisions. A negative result may occur with  improper specimen collection/handling, submission of specimen other than nasopharyngeal swab, presence of viral mutation(s) within the areas targeted by this assay, and inadequate number of viral copies(<138 copies/mL). A negative result must be combined with clinical observations, patient history, and epidemiological information. The expected result is Negative.  Fact Sheet for Patients:  EntrepreneurPulse.com.au  Fact Sheet for Healthcare Providers:  IncredibleEmployment.be  This test is no t yet approved or cleared by the [place] FDA and  has  been authorized for detection and/or diagnosis of SARS-CoV-2 by FDA under an Emergency Use Authorization (EUA). This EUA will remain  in effect (meaning this test can be used) for the duration of the COVID-19 declaration under Section 564(b)(1) of the Act, 21 U.S.C.section 360bbb-3(b)(1), unless the authorization is terminated  or revoked sooner.       Influenza A by PCR NEGATIVE NEGATIVE Final   Influenza B by PCR NEGATIVE NEGATIVE Final    Comment: (NOTE) The Xpert Xpress SARS-CoV-2/FLU/RSV plus assay is intended as an aid in the diagnosis of influenza from Nasopharyngeal swab specimens and should not be used as a sole basis for treatment. Nasal washings and aspirates are unacceptable for Xpert Xpress SARS-CoV-2/FLU/RSV testing.  Fact Sheet for Patients: EntrepreneurPulse.com.au  Fact Sheet for Healthcare Providers: IncredibleEmployment.be  This test is not yet approved or cleared by the [place] FDA and has been authorized for detection and/or diagnosis of SARS-CoV-2 by FDA under an Emergency Use Authorization (EUA). This EUA will remain in effect (meaning this test can be used) for the duration of the COVID-19 declaration under Section 564(b)(1) of the Act, 21 U.S.C. section 360bbb-3(b)(1), unless the authorization is terminated or revoked.  Performed at [facility], [place] [address] [place], [place] [address]          Radiology Studies: No results found.      Scheduled Meds:  atorvastatin  40 mg Per NG tube Daily   dicyclomine  10 mg Per NG tube BID   diltiazem  30 mg Oral Q8H   enoxaparin (LOVENOX) injection  30 mg Subcutaneous Q24H   feeding supplement (PROSource TF)  45 mL Per Tube BID   gabapentin  300 mg Per Tube QHS   lipase/protease/amylase  36,000 Units Oral TID AC   mirtazapine  15 mg Per NG tube QHS   multivitamin with minerals  1 tablet Per Tube Daily   nicotine  14 mg Transdermal Q24H    ondansetron  4 mg Oral Q6H   Or   ondansetron (ZOFRAN) IV  4 mg Intravenous Q6H   [START ON 11/10/2021] pantoprazole sodium  40 mg Per Tube Daily   senna-docusate  1 tablet Per NG tube BID   sucralfate  1 g Per NG tube TID WC & HS   Continuous  Infusions:  feeding supplement (VITAL 1.5 CAL) 1,000 mL (11/07/21 1633)   magnesium sulfate bolus IVPB       LOS: 8 days    Time spent: More than 35 minutes    Jamita Mckelvin G Sherrilynn Gudgel, MD [facility] Pager   If 7PM-7AM, please contact night-coverage [email or web address] Password [facility] 11/09/2021, 3:23 PM

## 2021-11-09 NOTE — Progress Notes (Signed)
Pt brought down to MRI via pt transport for 2nd attempt. Pt arrived calm. Pt transferred onto imaging table, prepared for exam and began scanning. As soon as first imaging sequence began, pt began shoving off required body imaging coil, raising legs up, and shifting entire body position. Unable to verbally redirect pt. Unable to obtain diagnostic imaging in pt's current state. RN made aware. Pt sent back to room via pt transport. (See previous note for details on previous attempt)

## 2021-11-09 NOTE — Progress Notes (Signed)
Subjective: No complaints.  No abdominal pain.  Objective: Vital signs in last 24 hours: Temp:  [97.9 F (36.6 C)-99.8 F (37.7 C)] 99.8 F (37.7 C) (12/04 0401) Pulse Rate:  [87-100] 91 (12/04 0401) Resp:  [16-18] 16 (12/04 0401) BP: (140-158)/(54-76) 140/60 (12/04 0401) SpO2:  [93 %-97 %] 96 % (12/04 0401) Last BM Date:  (Patient unable to recall)  Intake/Output from previous day: 12/03 0701 - 12/04 0700 In: 0  Out: 510 [Urine:450; Emesis/NG output:60] Intake/Output this shift: No intake/output data recorded.  General appearance: alert and no distress GI: soft, non-tender; bowel sounds normal; no masses,  no organomegaly  Lab Results: No results for input(s): WBC, HGB, HCT, PLT in the last 72 hours. BMET No results for input(s): NA, K, CL, CO2, GLUCOSE, BUN, CREATININE, CALCIUM in the last 72 hours. LFT No results for input(s): PROT, ALBUMIN, AST, ALT, ALKPHOS, BILITOT, BILIDIR, IBILI in the last 72 hours. PT/INR No results for input(s): LABPROT, INR in the last 72 hours. Hepatitis Panel No results for input(s): HEPBSAG, HCVAB, HEPAIGM, HEPBIGM in the last 72 hours. C-Diff No results for input(s): CDIFFTOX in the last 72 hours. Fecal Lactopherrin No results for input(s): FECLLACTOFRN in the last 72 hours.  Studies/Results: DG Abd Portable 1V  Result Date: 11/07/2021 CLINICAL DATA:  Feeding tube placement. EXAM: PORTABLE ABDOMEN - 1 VIEW COMPARISON:  None. FINDINGS: The bowel gas pattern is normal. Distal tip of feeding tube is seen in expected position of distal stomach. No radio-opaque calculi or other significant radiographic abnormality are seen. IMPRESSION: Distal tip of feeding tube is seen in expected position of distal stomach. Electronically Signed   By: Marijo Conception M.D.   On: 11/07/2021 10:21   CT Angio Abd/Pel w/ and/or w/o  Result Date: 11/07/2021 CLINICAL DATA:  Abdominal pain, aortic dissection suspected. EXAM: CTA ABDOMEN AND PELVIS WITHOUT AND WITH  CONTRAST TECHNIQUE: Multidetector CT imaging of the abdomen and pelvis was performed using the standard protocol during bolus administration of intravenous contrast. Multiplanar reconstructed images and MIPs were obtained and reviewed to evaluate the vascular anatomy. CONTRAST:  48mL OMNIPAQUE IOHEXOL 350 MG/ML SOLN COMPARISON:  CT abdomen/pelvis 10/31/2021 FINDINGS: VASCULAR Aorta: Normal in caliber. No evidence of aneurysm or dissection. Extensive mixed calcified and fibrofatty atherosclerotic plaque throughout the abdominal aorta. Celiac: Patent without evidence of aneurysm, dissection, vasculitis or significant stenosis. SMA: Patent without evidence of aneurysm, dissection, vasculitis or significant stenosis. Renals: Solitary renal arteries bilaterally. Calcified atherosclerotic plaque results in mild stenosis in the proximal renal arteries bilaterally. No aneurysm, dissection or evidence of fibromuscular dysplasia. IMA: Diminutive.  Suspect proximal stenosis. Inflow: No evidence of aneurysm, dissection, or occlusion. Extensive atherosclerotic plaque bilaterally. Probable focal significant stenosis in the distal left common iliac artery Proximal Outflow: Extensive atherosclerotic plaque with severe bilateral SFA disease likely occluded on the right and near occluded on the left. Veins: No focal venous abnormality. Review of the MIP images confirms the above findings. NON-VASCULAR Lower chest: No acute abnormality. Hepatobiliary: No focal liver abnormality is seen. Status post cholecystectomy. No biliary dilatation. Pancreas: Abnormal appearance of the pancreas with what appears to be multifocal segmental dilations of the main pancreatic duct in the tail and body measuring up to 5-6 mm. Additionally, there is an ill-defined rounded low-attenuation lesion in the mid pancreas measuring approximately 1.6 by 1.1 cm. Additionally, the pancreatic tail and body are slightly ill-defined and there is subtle inflammatory  stranding in the peripancreatic fat. These findings suggest pancreatitis. Spleen: Normal in  size without focal abnormality. Adrenals/Urinary Tract: No hydronephrosis. Multifocal low-attenuation renal lesions most consistent with simple cysts. Following administration of intravenous contrast, the intermediate density lesion exophytic from the upper pole of the left kidney demonstrates no convincing evidence of enhancement compared to the recent prior noncontrast enhanced CT scan. This is most consistent with a hemorrhagic or proteinaceous cyst. The ureters and bladder are unremarkable. Stomach/Bowel: Colonic diverticular disease without CT evidence of active inflammation. No evidence of obstruction or focal bowel wall thickening. Normal appendix in the right lower quadrant. The terminal ileum is unremarkable. Feeding tube tip lies in the gastric antrum. Lymphatic: No suspicious lymphadenopathy. Reproductive: Status post hysterectomy. No adnexal masses. Other: No abdominal wall hernia or abnormality. No abdominopelvic ascites. Musculoskeletal: No acute fracture or aggressive appearing lytic or blastic osseous lesion. Extensive bilateral lumbar facet arthropathy and multilevel degenerative disc disease. IMPRESSION: VASCULAR 1. No evidence of acute arterial occlusion, dissection or thrombosis. 2. No significant stenosis involving the visceral or renal arteries. 3. Extensive atherosclerotic plaque involving the aorta, branch vessels and lower extremity arteries. High-grade stenosis versus occlusion of the superficial femoral arteries bilaterally are likely chronic. NON-VASCULAR 1. Suspect acute pancreatitis with ill-defined edematous pancreatic tail and proximal body associated with inflammatory stranding in the peripancreatic fat. 2. Irregular appearance of the pancreatic duct with apparent areas of segmental dilation and a questionable 1.6 cm low-attenuation lesion in the mid pancreas. Findings may reflect sequelae of  chronic pancreatitis and a developing pancreatic pseudocyst. Pancreatic neoplasm is difficult to exclude entirely. After resolution of the patient's acute episode, a pancreas protocol MRI of the abdomen could be considered for further evaluation. 3. The enlarging intermediate density lesion exophytic from the upper pole of the left kidney demonstrates no evidence of enhancement on today's exam suggesting that this represents a proteinaceous or hemorrhagic cyst rather than a renal neoplasm. 4. The tip of the enteric feeding tube is within the gastric antrum. Recommend advancing if the intent is for transpyloric feeds. 5. Additional ancillary findings as above without significant interval change compared to recent prior imaging. Electronically Signed   By: Jacqulynn Cadet M.D.   On: 11/07/2021 13:55    Medications: Scheduled:  atorvastatin  40 mg Per NG tube Daily   dicyclomine  10 mg Per NG tube BID   diltiazem  30 mg Oral Q8H   enoxaparin (LOVENOX) injection  30 mg Subcutaneous Q24H   feeding supplement (PROSource TF)  45 mL Per Tube BID   gabapentin  300 mg Per Tube QHS   lipase/protease/amylase  36,000 Units Oral TID AC   LORazepam  1 mg Intravenous Once   mirtazapine  15 mg Per NG tube QHS   multivitamin with minerals  1 tablet Per Tube Daily   nicotine  14 mg Transdermal Q24H   ondansetron  4 mg Oral Q6H   Or   ondansetron (ZOFRAN) IV  4 mg Intravenous Q6H   pantoprazole (PROTONIX) IV  40 mg Intravenous Q12H   senna-docusate  1 tablet Per NG tube BID   sucralfate  1 g Per NG tube TID WC & HS   Continuous:  feeding supplement (VITAL 1.5 CAL) 1,000 mL (11/07/21 1633)    Assessment/Plan: 1) Abdominal pain of unclear etiology. 2) Malnutrition. 3) IPMN.   The patient feels that she wants to try eating, although she is concerned about possible pain.    Plan: 1) Advance to a regular diet. 2) Continue with tube feeding for now. 3) Continue with Creon.  LOS:  8 days   Tiegan Jambor  D 11/09/2021, 8:01 AM

## 2021-11-09 NOTE — Progress Notes (Signed)
Brief GI Path Note: Biopsies taken from EGD were normal. Details of biopsies are below.  A. DUODENUM, BIOPSY:  - Duodenal mucosa with no significant pathologic alteration.  - Negative for features of celiac disease.  - Negative for dysplasia and malignancy.   B. STOMACH, BIOPSY:  - Antral and oxyntic mucosa with no significant pathologic alteration.  - Negative for active inflammation and H pylori.  - Negative for intestinal metaplasia, dysplasia, and malignancy.   C. ESOPHAGUS, BIOPSY:  - Squamocolumnar junction with no significant pathologic alteration.  - Negative for increased eosinophils.  - Negative for fungal elements and viral cytopathic effect.  - Negative for intestinal metaplasia, dysplasia, and malignancy.

## 2021-11-10 ENCOUNTER — Inpatient Hospital Stay (HOSPITAL_COMMUNITY): Payer: Medicare Other

## 2021-11-10 DIAGNOSIS — R634 Abnormal weight loss: Secondary | ICD-10-CM | POA: Diagnosis not present

## 2021-11-10 DIAGNOSIS — E43 Unspecified severe protein-calorie malnutrition: Secondary | ICD-10-CM | POA: Diagnosis not present

## 2021-11-10 DIAGNOSIS — R1013 Epigastric pain: Secondary | ICD-10-CM | POA: Diagnosis not present

## 2021-11-10 DIAGNOSIS — K861 Other chronic pancreatitis: Secondary | ICD-10-CM | POA: Diagnosis not present

## 2021-11-10 DIAGNOSIS — R630 Anorexia: Secondary | ICD-10-CM

## 2021-11-10 DIAGNOSIS — R1010 Upper abdominal pain, unspecified: Secondary | ICD-10-CM | POA: Diagnosis not present

## 2021-11-10 LAB — COMPREHENSIVE METABOLIC PANEL
ALT: 8 U/L (ref 0–44)
AST: 14 U/L — ABNORMAL LOW (ref 15–41)
Albumin: 2.2 g/dL — ABNORMAL LOW (ref 3.5–5.0)
Alkaline Phosphatase: 59 U/L (ref 38–126)
Anion gap: 5 (ref 5–15)
BUN: 27 mg/dL — ABNORMAL HIGH (ref 8–23)
CO2: 24 mmol/L (ref 22–32)
Calcium: 8.6 mg/dL — ABNORMAL LOW (ref 8.9–10.3)
Chloride: 107 mmol/L (ref 98–111)
Creatinine, Ser: 1.29 mg/dL — ABNORMAL HIGH (ref 0.44–1.00)
GFR, Estimated: 41 mL/min — ABNORMAL LOW (ref >60)
Glucose, Bld: 112 mg/dL — ABNORMAL HIGH (ref 70–99)
Potassium: 4 mmol/L (ref 3.5–5.1)
Sodium: 136 mmol/L (ref 135–145)
Total Bilirubin: 0.3 mg/dL (ref 0.3–1.2)
Total Protein: 5.8 g/dL — ABNORMAL LOW (ref 6.5–8.1)

## 2021-11-10 LAB — CBC WITH DIFFERENTIAL/PLATELET
Abs Immature Granulocytes: 0.05 10*3/uL (ref 0.00–0.07)
Basophils Absolute: 0 10*3/uL (ref 0.0–0.1)
Basophils Relative: 0 %
Eosinophils Absolute: 0.1 10*3/uL (ref 0.0–0.5)
Eosinophils Relative: 1 %
HCT: 34.2 % — ABNORMAL LOW (ref 36.0–46.0)
Hemoglobin: 10.8 g/dL — ABNORMAL LOW (ref 12.0–15.0)
Immature Granulocytes: 1 %
Lymphocytes Relative: 10 %
Lymphs Abs: 0.9 10*3/uL (ref 0.7–4.0)
MCH: 26.7 pg (ref 26.0–34.0)
MCHC: 31.6 g/dL (ref 30.0–36.0)
MCV: 84.7 fL (ref 80.0–100.0)
Monocytes Absolute: 0.9 10*3/uL (ref 0.1–1.0)
Monocytes Relative: 9 %
Neutro Abs: 7.6 10*3/uL (ref 1.7–7.7)
Neutrophils Relative %: 79 %
Platelets: 150 10*3/uL (ref 150–400)
RBC: 4.04 MIL/uL (ref 3.87–5.11)
RDW: 14.8 % (ref 11.5–15.5)
WBC: 9.5 10*3/uL (ref 4.0–10.5)
nRBC: 0 % (ref 0.0–0.2)

## 2021-11-10 LAB — GLUCOSE, CAPILLARY
Glucose-Capillary: 122 mg/dL — ABNORMAL HIGH (ref 70–99)
Glucose-Capillary: 137 mg/dL — ABNORMAL HIGH (ref 70–99)
Glucose-Capillary: 150 mg/dL — ABNORMAL HIGH (ref 70–99)
Glucose-Capillary: 153 mg/dL — ABNORMAL HIGH (ref 70–99)
Glucose-Capillary: 156 mg/dL — ABNORMAL HIGH (ref 70–99)
Glucose-Capillary: 98 mg/dL (ref 70–99)

## 2021-11-10 LAB — MAGNESIUM: Magnesium: 2.1 mg/dL (ref 1.7–2.4)

## 2021-11-10 MED ORDER — ASPIRIN EC 81 MG PO TBEC
81.0000 mg | DELAYED_RELEASE_TABLET | Freq: Every day | ORAL | Status: DC
  Administered 2021-11-10 – 2021-11-17 (×6): 81 mg via ORAL
  Filled 2021-11-10 (×8): qty 1

## 2021-11-10 MED ORDER — ATORVASTATIN CALCIUM 40 MG PO TABS: 40.0000 mg | ORAL_TABLET | Freq: Every evening | ORAL | Status: DC

## 2021-11-10 MED ORDER — VERAPAMIL HCL ER 240 MG PO TBCR
240.0000 mg | EXTENDED_RELEASE_TABLET | Freq: Every day | ORAL | Status: DC
  Administered 2021-11-10 – 2021-11-17 (×6): 240 mg via ORAL
  Filled 2021-11-10 (×8): qty 1

## 2021-11-10 MED ORDER — MIRTAZAPINE 15 MG PO TABS: 15.0000 mg | ORAL_TABLET | Freq: Every day | ORAL | Status: DC

## 2021-11-10 NOTE — Progress Notes (Signed)
[facility] Progress Note  Chief Complaint: Upper abdominal pain and weight loss  History:  Signout received from Dr. Benson Norway and extensive chart review performed regarding this patient's chronic abdominal pain, anorexia and weight loss. Dr. Ulyses Amor weekend notes indicate patient was to be started on regular diet, but the patient reports she has not wanted to eat anything for fear of abdominal pain. She gives limited history and review of systems due to fatigue and general debility. She indicates a general bandlike upper abdominal location of her abdominal pain. ROS: Cardiovascular: No chest pain Respiratory: No dyspnea Urinary: No dysuria  Objective:   Current Facility-Administered Medications:    0.9 %  sodium chloride infusion, , Intravenous, Continuous, Cristescu, Linard Millers, MD, Last Rate: 75 mL/hr at 11/10/21 1033, New Bag at 11/10/21 1033   acetaminophen (TYLENOL) tablet 650 mg, 650 mg, Per NG tube, Q6H PRN, 650 mg at 11/10/21 0509 **OR** acetaminophen (TYLENOL) suppository 650 mg, 650 mg, Rectal, Q6H PRN, Cristescu, Mircea G, MD   albuterol (PROVENTIL) (2.5 MG/3ML) 0.083% nebulizer solution 3 mL, 3 mL, Inhalation, Q6H PRN, Karmen Bongo, MD   atorvastatin (LIPITOR) tablet 40 mg, 40 mg, Per NG tube, Daily, Cristescu, Linard Millers, MD, 40 mg at 11/10/21 1002   bisacodyl (DULCOLAX) EC tablet 5 mg, 5 mg, Oral, Daily PRN, Karmen Bongo, MD   dicyclomine (BENTYL) capsule 10 mg, 10 mg, Per NG tube, BID, Cristescu, Mircea G, MD, 10 mg at 11/10/21 1002   diltiazem (CARDIZEM) 10 mg/ml oral suspension SOLN 30 mg, 30 mg, Oral, Q8H, Cristescu, Mircea G, MD, 30 mg at 11/10/21 0512   enoxaparin (LOVENOX) injection 30 mg, 30 mg, Subcutaneous, Q24H, Hammons, Kimberly B, RPH, 30 mg at 11/09/21 1805   feeding supplement (PROSource TF) liquid 45 mL, 45 mL, Per Tube, BID, Darrick Meigs, Marge Duncans, MD, 45 mL at 11/10/21 1004   feeding supplement (VITAL 1.5 CAL) liquid 1,000 mL, 1,000 mL, Per Tube, Continuous, Darrick Meigs,  Marge Duncans, MD, Last Rate: 50 mL/hr at 11/09/21 2140, 1,000 mL at 11/09/21 2140   fentaNYL (SUBLIMAZE) injection 12.5 mcg, 12.5 mcg, Intravenous, Q2H PRN, Karmen Bongo, MD, 12.5 mcg at 11/04/21 2106   gabapentin (NEURONTIN) capsule 300 mg, 300 mg, Per Tube, QHS, Cristescu, Linard Millers, MD, 300 mg at 11/09/21 2123   hydrALAZINE (APRESOLINE) injection 5 mg, 5 mg, Intravenous, Q4H PRN, Karmen Bongo, MD, 5 mg at 11/03/21 1652   lipase/protease/amylase (CREON) capsule 36,000 Units, 36,000 Units, Oral, TID AC, Esterwood, Amy S, PA-C, 36,000 Units at 11/10/21 1003   mirtazapine (REMERON) tablet 15 mg, 15 mg, Per NG tube, QHS, Cristescu, Linard Millers, MD, 15 mg at 11/09/21 2124   multivitamin with minerals tablet 1 tablet, 1 tablet, Per Tube, Daily, Cristescu, Linard Millers, MD, 1 tablet at 11/10/21 1002   nicotine (NICODERM CQ - dosed in mg/24 hours) patch 14 mg, 14 mg, Transdermal, Q24H, Karmen Bongo, MD, 14 mg at 11/09/21 1811   ondansetron (ZOFRAN) tablet 4 mg, 4 mg, Oral, Q6H, 4 mg at 11/09/21 1804 **OR** ondansetron (ZOFRAN) injection 4 mg, 4 mg, Intravenous, Q6H, Esterwood, Amy S, PA-C, 4 mg at 11/10/21 9983   oxyCODONE (Oxy IR/ROXICODONE) immediate release tablet 5 mg, 5 mg, Per NG tube, Q4H PRN, Cristescu, Mircea G, MD, 5 mg at 11/08/21 1346   pantoprazole sodium (PROTONIX) 40 mg/20 mL oral suspension 40 mg, 40 mg, Per Tube, Daily, Cristescu, Mircea G, MD, 40 mg at 11/10/21 1001   polyethylene glycol (MIRALAX / GLYCOLAX) packet 17 g, 17 g, Per  NG tube, Daily PRN, Cristescu, Mircea G, MD   senna-docusate (Senokot-S) tablet 1 tablet, 1 tablet, Per NG tube, BID, Cristescu, Mircea G, MD, 1 tablet at 11/10/21 1002   sucralfate (CARAFATE) tablet 1 g, 1 g, Per NG tube, TID WC & HS, Cristescu, Mircea G, MD, 1 g at 11/10/21 1003   sodium chloride 75 mL/hr at 11/10/21 1033   feeding supplement (VITAL 1.5 CAL) 1,000 mL (11/09/21 2140)     Vital signs in last 24 hrs: Vitals:   11/10/21 0421 11/10/21 0844  BP:  (!) 145/54 (!) 140/56  Pulse: 100 93  Resp: 15 18  Temp: (!) 97.5 F (36.4 C) 98 F (36.7 C)  SpO2: (!) 88% 97%    Intake/Output Summary (Last 24 hours) at 11/10/2021 1135 Last data filed at 11/09/2021 2300 Gross per 24 hour  Intake 1455.84 ml  Output --  Net 1455.84 ml     Physical Exam Febrile, poor muscle mass.  Awakens easily and is conversational but generally weak. HEENT: sclera anicteric, oral mucosa without lesions.  Core track tube in left nostril, running well Neck: supple, no thyromegaly, JVD or lymphadenopathy Cardiac: RRR without murmurs, S1S2 heard, Pulm: clear to auscultation bilaterally, normal RR and effort noted Abdomen: soft, nondistended, normal bowel sounds, no tenderness Skin; warm and dry, no jaundice  Recent Labs:  CBC Latest Ref Rng & Units 11/09/2021 11/06/2021 11/05/2021  WBC 4.0 - 10.5 K/uL 8.2 6.2 7.4  Hemoglobin 12.0 - 15.0 g/dL 10.5(L) 12.5 12.1  Hematocrit 36.0 - 46.0 % 33.0(L) 39.9 37.9  Platelets 150 - 400 K/uL 122(L) 141(L) 138(L)    No results for input(s): INR in the last 168 hours. CMP Latest Ref Rng & Units 11/09/2021 11/06/2021 11/05/2021  Glucose 70 - 99 mg/dL 123(H) 69(L) 92  BUN 8 - 23 mg/dL 29(H) 12 10  Creatinine 0.44 - 1.00 mg/dL 1.39(H) 1.27(H) 1.23(H)  Sodium 135 - 145 mmol/L 132(L) 136 135  Potassium 3.5 - 5.1 mmol/L 3.7 3.9 3.9  Chloride 98 - 111 mmol/L 104 106 106  CO2 22 - 32 mmol/L 18(L) 21(L) 23  Calcium 8.9 - 10.3 mg/dL 8.5(L) 9.2 9.0  Total Protein 6.5 - 8.1 g/dL 5.4(L) 6.0(L) 5.8(L)  Total Bilirubin 0.3 - 1.2 mg/dL 0.2(L) 0.6 0.8  Alkaline Phos 38 - 126 U/L 49 69 70  AST 15 - 41 U/L 16 17 18   ALT 0 - 44 U/L 10 11 11    Duodenal biopsies (all normal)  A. DUODENUM, BIOPSY:  - Duodenal mucosa with no significant pathologic alteration.  - Negative for features of celiac disease.  - Negative for dysplasia and malignancy.   B. STOMACH, BIOPSY:  - Antral and oxyntic mucosa with no significant pathologic alteration.   - Negative for active inflammation and H pylori.  - Negative for intestinal metaplasia, dysplasia, and malignancy.   C. ESOPHAGUS, BIOPSY:  - Squamocolumnar junction with no significant pathologic alteration.  - Negative for increased eosinophils.  - Negative for fungal elements and viral cytopathic effect.  - Negative for intestinal metaplasia, dysplasia, and malignancy.          Radiologic studies: CTA abdomen  CLINICAL DATA:  Abdominal pain, aortic dissection suspected.   EXAM: CTA ABDOMEN AND PELVIS WITHOUT AND WITH CONTRAST   TECHNIQUE: Multidetector CT imaging of the abdomen and pelvis was performed using the standard protocol during bolus administration of intravenous contrast. Multiplanar reconstructed images and MIPs were obtained and reviewed to evaluate the vascular anatomy.   CONTRAST:  2mL OMNIPAQUE IOHEXOL 350 MG/ML SOLN   COMPARISON:  CT abdomen/pelvis 10/31/2021   FINDINGS: VASCULAR   Aorta: Normal in caliber. No evidence of aneurysm or dissection. Extensive mixed calcified and fibrofatty atherosclerotic plaque throughout the abdominal aorta.   Celiac: Patent without evidence of aneurysm, dissection, vasculitis or significant stenosis.   SMA: Patent without evidence of aneurysm, dissection, vasculitis or significant stenosis.   Renals: Solitary renal arteries bilaterally. Calcified atherosclerotic plaque results in mild stenosis in the proximal renal arteries bilaterally. No aneurysm, dissection or evidence of fibromuscular dysplasia.   IMA: Diminutive.  Suspect proximal stenosis.   Inflow: No evidence of aneurysm, dissection, or occlusion. Extensive atherosclerotic plaque bilaterally. Probable focal significant stenosis in the distal left common iliac artery   Proximal Outflow: Extensive atherosclerotic plaque with severe bilateral SFA disease likely occluded on the right and near occluded on the left.   Veins: No focal venous  abnormality.   Review of the MIP images confirms the above findings.   NON-VASCULAR   Lower chest: No acute abnormality.   Hepatobiliary: No focal liver abnormality is seen. Status post cholecystectomy. No biliary dilatation.   Pancreas: Abnormal appearance of the pancreas with what appears to be multifocal segmental dilations of the main pancreatic duct in the tail and body measuring up to 5-6 mm. Additionally, there is an ill-defined rounded low-attenuation lesion in the mid pancreas measuring approximately 1.6 by 1.1 cm. Additionally, the pancreatic tail and body are slightly ill-defined and there is subtle inflammatory stranding in the peripancreatic fat. These findings suggest pancreatitis.   Spleen: Normal in size without focal abnormality.   Adrenals/Urinary Tract: No hydronephrosis. Multifocal low-attenuation renal lesions most consistent with simple cysts. Following administration of intravenous contrast, the intermediate density lesion exophytic from the upper pole of the left kidney demonstrates no convincing evidence of enhancement compared to the recent prior noncontrast enhanced CT scan. This is most consistent with a hemorrhagic or proteinaceous cyst. The ureters and bladder are unremarkable.   Stomach/Bowel: Colonic diverticular disease without CT evidence of active inflammation. No evidence of obstruction or focal bowel wall thickening. Normal appendix in the right lower quadrant. The terminal ileum is unremarkable. Feeding tube tip lies in the gastric antrum.   Lymphatic: No suspicious lymphadenopathy.   Reproductive: Status post hysterectomy. No adnexal masses.   Other: No abdominal wall hernia or abnormality. No abdominopelvic ascites.   Musculoskeletal: No acute fracture or aggressive appearing lytic or blastic osseous lesion. Extensive bilateral lumbar facet arthropathy and multilevel degenerative disc disease.   IMPRESSION: VASCULAR   1. No  evidence of acute arterial occlusion, dissection or thrombosis. 2. No significant stenosis involving the visceral or renal arteries. 3. Extensive atherosclerotic plaque involving the aorta, branch vessels and lower extremity arteries. High-grade stenosis versus occlusion of the superficial femoral arteries bilaterally are likely chronic.   NON-VASCULAR   1. Suspect acute pancreatitis with ill-defined edematous pancreatic tail and proximal body associated with inflammatory stranding in the peripancreatic fat. 2. Irregular appearance of the pancreatic duct with apparent areas of segmental dilation and a questionable 1.6 cm low-attenuation lesion in the mid pancreas. Findings may reflect sequelae of chronic pancreatitis and a developing pancreatic pseudocyst. Pancreatic neoplasm is difficult to exclude entirely. After resolution of the patient's acute episode, a pancreas protocol MRI of the abdomen could be considered for further evaluation. 3. The enlarging intermediate density lesion exophytic from the upper pole of the left kidney demonstrates no evidence of enhancement on today's exam suggesting that this represents a  proteinaceous or hemorrhagic cyst rather than a renal neoplasm. 4. The tip of the enteric feeding tube is within the gastric antrum. Recommend advancing if the intent is for transpyloric feeds. 5. Additional ancillary findings as above without significant interval change compared to recent prior imaging.     Electronically Signed   By: Jacqulynn Cadet M.D.   On: 11/07/2021 13:55   Assessment & Plan  Assessment:  Diffuse upper abdominal pain Loss of appetite Weight loss with severe protein calorie malnutrition  Initial concerns were for nonocclusive mesenteric ischemia ("intestinal angina"), however no celiac axis or SMA stenosis seen on CT angiogram. That study does demonstrate significant changes indicating chronic pancreatitis with pancreatic duct  distortion, cause of this unknown.  There is a cystic abnormality 1.6 cm low-attenuation, radiology suspects sequelae of chronic pancreatitis or perhaps developing pseudocyst and cannot exclude neoplasm.  Patient's malnutrition currently being treated with core track tube feeds, though the long-term nutrition plan is unclear since the patient has persistent sitophobia. She will need pain control, continue pancreatic enzyme supplements, encouragement to slowly try a low-fat diet (with the hope of controlling but perhaps not eliminating pain with judicious use of pain meds), and ultimately weaning her from tube feeds.  Outpatient endoscopic ultrasound may be the best modality to further evaluate the architectural changes of the pancreas and rule out neoplasm.  We will discuss case with advanced endoscopist.  Total time 35 minutes, over half spent face-to-face with patient.  Extensive chart review required on complex medical history and work-up.  Nelida Meuse III Office: [phone]

## 2021-11-10 NOTE — Progress Notes (Signed)
Mobility Specialist Criteria Algorithm Info.  11/10/21 1427  Mobility  Activity Dangled on edge of bed;Refused mobility (Patient declined further mobility d/t stomach pain/discomfort)  Range of Motion/Exercises Active;All extremities  Level of Assistance Minimal assist, patient does 75% or more  Assistive Device None  Mobility Response Tolerated fair  Mobility performed by Mobility specialist  Bed Position Semi-fowlers   Patient received in bed initially willing to sit in recliner chair. Pt required minimal HHA to EOB. Dangled for approximately 3 minutes and declined further mobility secondary to pain and discomfort in stomach. Pt then laid back down in bed without any assistance. Was left in side lying position stating its the only comfortable position. Will check back as time permits.   11/10/2021 2:28 PM

## 2021-11-10 NOTE — Progress Notes (Signed)
PROGRESS NOTE    Linda [name]  GQQ:[id number] DOB: 10-Feb-1936 DOA: 10/31/2021 PCP: Sonia Side., FNP   Brief Narrative:  85 year old female with remote history of breast cancer, hypertension, dyslipidemia, peripheral vascular disease, questionable left renal mass came to ED with complaints of lower abdominal pain and discomfort for 4 to 5 weeks.  Worse after eating, poor p.o. intake and ongoing weight loss.  In the ED she was found to have elevated lipase 378, creatinine 1.7 CT abdomen showed left upper pole renal mass larger than 2020 and a cystic lesion in the pancreatic tail.  Gastroenterology was consulted, CT abdomen was being considered to rule out mesenteric ischemia which was deferred due to acute kidney injury.  Endoscopy on 11/30 was unrevealing   Not able to tolerate the MRI  Assessment & Plan:   Principal Problem:   Abdominal pain Active Problems:   Malignant neoplasm of female breast [facility])   Essential hypertension   Tobacco abuse   AKI (acute kidney injury) ([place])   HLD (hyperlipidemia)   Renal mass   Malnutrition of moderate degree   Decreased oral intake   Gastritis and gastroduodenitis  Abdominal pain/acute on chronic pancreatitis/chronic moderate to severe protein calorie malnutrition/weight loss: Initially, potential cause of her acute on chronic abdominal pain was presumed to be vasculopathy/mesenteric ischemia however patient underwent CT angiogram of the abdomen however no celiac axis or SMA stenosis seen on CT angiogram. That study does demonstrate significant changes indicating chronic pancreatitis with pancreatic duct distortion, cause of this unknown.  There is a cystic abnormality 1.6 cm low-attenuation, radiology suspects sequelae of chronic pancreatitis or perhaps developing pseudocyst and cannot exclude neoplasm. Patient's malnutrition currently being treated with core track tube feeds, though the long-term nutrition plan is unclear since the patient has  persistent sitophobia. She will need pain control, continue pancreatic enzyme supplements, encouragement to slowly try a low-fat diet (with the hope of controlling but perhaps not eliminating pain with judicious use of pain meds), and ultimately weaning her from tube feeds.  GI has seen her and they are recommending and perhaps are going to arrange outpatient endoscopic ultrasound to evaluate possible pancreatic neoplasm.  I have switched her to low-fat diet.  I encouraged her to try that.  Left renal mass: Larger as compared to the imaging in 2020.  Concerning for renal Carcinoma.  Will order renal ultrasound now.  AKI: Creatinine slowly improving, currently 129.  Continue gentle hydration.  Monitor daily.  Essential hypertension: Blood pressure controlled, her home medications are on hold.  Will resume verapamil but continue to hold hydralazine and Cozaar for now.  Discontinue diltiazem.    DVT prophylaxis: enoxaparin (LOVENOX) injection 30 mg Start: 11/06/21 1700   Code Status: DNR  Family Communication:  None present at bedside.  Plan of care discussed with patient in length and he verbalized understanding and agreed with it.  Status is: Inpatient  Remains inpatient appropriate because: Still with abdominal pain.  Estimated body mass index is 24.58 kg/m as calculated from the following:   Height as of this encounter: 5\' 1"  (1.549 m).   Weight as of this encounter: 59 kg.     Nutritional Assessment: Body mass index is 24.58 kg/m.Marland Kitchen Seen by dietician.  I agree with the assessment and plan as outlined below: Nutrition Status: Nutrition Problem: Moderate Malnutrition Etiology: chronic illness (PAD) Signs/Symptoms: mild fat depletion, moderate muscle depletion Interventions: Boost Breeze, MVI  .  Skin Assessment: I have examined the patient's skin  and I agree with the wound assessment as performed by the wound care RN as outlined below:    Consultants:  GI  Procedures:   None  Antimicrobials:  Anti-infectives (From admission, onward)    None          Subjective: Seen and examined.  Still complains of abdominal pain but improving.  No new complaint.  Objective: Vitals:   11/09/21 2014 11/10/21 0421 11/10/21 0844 11/10/21 1238  BP: (!) 122/41 (!) 145/54 (!) 140/56 (!) 141/73  Pulse: (!) 107 100 93 (!) 110  Resp: 15 15 18 20   Temp: 98 F (36.7 C) (!) 97.5 F (36.4 C) 98 F (36.7 C) 98.1 F (36.7 C)  TempSrc: Oral (P) Oral    SpO2: 96% (!) 88% 97% 96%  Weight:      Height:        Intake/Output Summary (Last 24 hours) at 11/10/2021 1312 Last data filed at 11/09/2021 2300 Gross per 24 hour  Intake 1455.84 ml  Output --  Net 1455.84 ml   Filed Weights   11/07/21 0637 11/08/21 0427 11/09/21 0500  Weight: 61.2 kg 59.6 kg 59 kg    Examination:  General exam: Appears calm and comfortable, very thin and cachectic. Respiratory system: Clear to auscultation. Respiratory effort normal. Cardiovascular system: S1 & S2 heard, RRR. No JVD, murmurs, rubs, gallops or clicks. No pedal edema. Gastrointestinal system: Abdomen is nondistended, soft and mild epigastric tenderness. No organomegaly or masses felt. Normal bowel sounds heard. Central nervous system: Alert and oriented. No focal neurological deficits. Extremities: Symmetric 5 x 5 power. Skin: No rashes, lesions or ulcers   Data Reviewed: I have personally reviewed following labs and imaging studies  CBC: Recent Labs  Lab 11/04/21 0210 11/05/21 0246 11/06/21 0244 11/09/21 0727 11/10/21 1056  WBC 5.3 7.4 6.2 8.2 9.5  NEUTROABS  --   --   --   --  7.6  HGB 11.1* 12.1 12.5 10.5* 10.8*  HCT 34.5* 37.9 39.9 33.0* 34.2*  MCV 82.5 82.6 82.3 83.5 84.7  PLT 117* 138* 141* 122* 702   Basic Metabolic Panel: Recent Labs  Lab 11/04/21 0210 11/05/21 0246 11/06/21 0244 11/07/21 1641 11/08/21 0553 11/08/21 1609 11/09/21 0727 11/10/21 1056  NA 137 135 136  --   --   --  132* 136  K  4.0 3.9 3.9  --   --   --  3.7 4.0  CL 110 106 106  --   --   --  104 107  CO2 22 23 21*  --   --   --  18* 24  GLUCOSE 85 92 69*  --   --   --  123* 112*  BUN 15 10 12   --   --   --  29* 27*  CREATININE 1.26* 1.23* 1.27*  --   --   --  1.39* 1.29*  CALCIUM 8.8* 9.0 9.2  --   --   --  8.5* 8.6*  MG  --   --   --  1.5* 1.6* 1.5* 1.4* 2.1  PHOS  --   --   --  4.1 3.4 2.7 2.8  --    GFR: Estimated Creatinine Clearance: 27.3 mL/min (A) (by C-G formula based on SCr of 1.29 mg/dL (H)). Liver Function Tests: Recent Labs  Lab 11/04/21 0210 11/05/21 0246 11/06/21 0244 11/09/21 0727 11/10/21 1056  AST 18 18 17 16  14*  ALT 11 11 11 10 8   ALKPHOS 68 70  69 49 59  BILITOT 0.8 0.8 0.6 0.2* 0.3  PROT 5.3* 5.8* 6.0* 5.4* 5.8*  ALBUMIN 2.6* 2.8* 2.9* 2.3* 2.2*   No results for input(s): LIPASE, AMYLASE in the last 168 hours. No results for input(s): AMMONIA in the last 168 hours. Coagulation Profile: No results for input(s): INR, PROTIME in the last 168 hours. Cardiac Enzymes: No results for input(s): CKTOTAL, CKMB, CKMBINDEX, TROPONINI in the last 168 hours. BNP (last 3 results) No results for input(s): PROBNP in the last 8760 hours. HbA1C: No results for input(s): HGBA1C in the last 72 hours. CBG: Recent Labs  Lab 11/09/21 2010 11/10/21 0009 11/10/21 0426 11/10/21 0842 11/10/21 1236  GLUCAP 116* 150* 137* 156* 122*   Lipid Profile: No results for input(s): CHOL, HDL, LDLCALC, TRIG, CHOLHDL, LDLDIRECT in the last 72 hours. Thyroid Function Tests: No results for input(s): TSH, T4TOTAL, FREET4, T3FREE, THYROIDAB in the last 72 hours. Anemia Panel: No results for input(s): VITAMINB12, FOLATE, FERRITIN, TIBC, IRON, RETICCTPCT in the last 72 hours. Sepsis Labs: No results for input(s): PROCALCITON, LATICACIDVEN in the last 168 hours.  No results found for this or any previous visit (from the past 240 hour(s)).    Radiology Studies: No results found.  Scheduled Meds:   atorvastatin  40 mg Per NG tube Daily   dicyclomine  10 mg Per NG tube BID   diltiazem  30 mg Oral Q8H   enoxaparin (LOVENOX) injection  30 mg Subcutaneous Q24H   feeding supplement (PROSource TF)  45 mL Per Tube BID   gabapentin  300 mg Per Tube QHS   lipase/protease/amylase  36,000 Units Oral TID AC   mirtazapine  15 mg Per NG tube QHS   multivitamin with minerals  1 tablet Per Tube Daily   nicotine  14 mg Transdermal Q24H   ondansetron  4 mg Oral Q6H   Or   ondansetron (ZOFRAN) IV  4 mg Intravenous Q6H   pantoprazole sodium  40 mg Per Tube Daily   senna-docusate  1 tablet Per NG tube BID   sucralfate  1 g Per NG tube TID WC & HS   Continuous Infusions:  sodium chloride 75 mL/hr at 11/10/21 1033   feeding supplement (VITAL 1.5 CAL) 1,000 mL (11/09/21 2140)     LOS: 9 days   Time spent: 30 minutes   Darliss Cheney, MD [facility]  11/10/2021, 1:12 PM  Please page via [place] and do not message via secure chat for anything urgent. Secure chat can be used for anything non urgent.  How to contact the [facility] Attending or Consulting provider [place] or covering provider during after hours [place], for this patient?  Check the care team in [facility] and look for a) attending/consulting [facility] provider listed and b) the [facility] team listed. Page or secure chat [phone]. Log into [email or web address] and use [place]'s universal password to access. If you do not have the password, please contact the hospital operator. Locate the [facility] provider you are looking for under [facility] and page to a number that you can be directly reached. If you still have difficulty reaching the provider, please page the [facility] (Director on Call) for the Hospitalists listed on amion for assistance.

## 2021-11-11 DIAGNOSIS — K861 Other chronic pancreatitis: Secondary | ICD-10-CM | POA: Diagnosis not present

## 2021-11-11 DIAGNOSIS — R630 Anorexia: Secondary | ICD-10-CM | POA: Diagnosis not present

## 2021-11-11 DIAGNOSIS — R634 Abnormal weight loss: Secondary | ICD-10-CM | POA: Diagnosis not present

## 2021-11-11 DIAGNOSIS — R1010 Upper abdominal pain, unspecified: Secondary | ICD-10-CM | POA: Diagnosis not present

## 2021-11-11 DIAGNOSIS — R1013 Epigastric pain: Secondary | ICD-10-CM | POA: Diagnosis not present

## 2021-11-11 LAB — BASIC METABOLIC PANEL
Anion gap: 7 (ref 5–15)
BUN: 24 mg/dL — ABNORMAL HIGH (ref 8–23)
CO2: 19 mmol/L — ABNORMAL LOW (ref 22–32)
Calcium: 8.3 mg/dL — ABNORMAL LOW (ref 8.9–10.3)
Chloride: 107 mmol/L (ref 98–111)
Creatinine, Ser: 1.14 mg/dL — ABNORMAL HIGH (ref 0.44–1.00)
GFR, Estimated: 48 mL/min — ABNORMAL LOW (ref >60)
Glucose, Bld: 108 mg/dL — ABNORMAL HIGH (ref 70–99)
Potassium: 3.9 mmol/L (ref 3.5–5.1)
Sodium: 133 mmol/L — ABNORMAL LOW (ref 135–145)

## 2021-11-11 LAB — GLUCOSE, CAPILLARY
Glucose-Capillary: 119 mg/dL — ABNORMAL HIGH (ref 70–99)
Glucose-Capillary: 95 mg/dL (ref 70–99)
Glucose-Capillary: 114 mg/dL — ABNORMAL HIGH (ref 70–99)
Glucose-Capillary: 120 mg/dL — ABNORMAL HIGH (ref 70–99)

## 2021-11-11 MED ORDER — MAGNESIUM HYDROXIDE 400 MG/5ML PO SUSP
30.0000 mL | Freq: Once | ORAL | Status: AC
  Administered 2021-11-11: 30 mL via ORAL
  Filled 2021-11-11: qty 30

## 2021-11-11 NOTE — Progress Notes (Signed)
Mobility Specialist Progress Note   11/11/21 1700  Mobility  Activity Ambulated in room;Ambulated to bathroom;Transferred:  Chair to bed  Level of Assistance Minimal assist, patient does 75% or more  Assistive Device Front wheel walker  Distance Ambulated (ft) 75 ft  Mobility Ambulated with assistance in room;Out of bed for toileting  Mobility Response Tolerated well  Mobility performed by Mobility specialist  $Mobility charge 1 Mobility   Received pt in chair requesting to use BR and go back to bed. Pt requiring min A STS and contact guard through out ambulation. Pt does tend have an exaggerated anterior bend while using the RW but showed no sign of weakness. Traveled to the bathroom 2x where pt had a successful void and BM. Returned back to bed w/ call bell by side and all needs met.   Holland Falling Mobility Specialist Phone Number [phone]

## 2021-11-11 NOTE — Progress Notes (Signed)
Daily Rounding Note  11/11/2021, 12:18 PM  LOS: 10 days   SUBJECTIVE:   Chief complaint: Abdominal pain.  Malnutrition.  Fear of eating.  Abdominal pain is much better but it still persist.  She has not been eating any of the offered heart healthy meals.  No bowel movement for more than a week.  No nausea.  Tolerating tube feeds  OBJECTIVE:         Vital signs in last 24 hours:    Temp:  [98 F (36.7 C)-98.4 F (36.9 C)] 98 F (36.7 C) (12/06 1214) Pulse Rate:  [94-110] 94 (12/06 1214) Resp:  [18-20] 18 (12/06 1214) BP: (98-141)/(54-73) 126/61 (12/06 1214) SpO2:  [90 %-100 %] 100 % (12/06 1214) Last BM Date:  (Patient unable to recall) Filed Weights   11/07/21 0637 11/08/21 0427 11/09/21 0500  Weight: 61.2 kg 59.6 kg 59 kg   General: Looks tired.  Alert.  Sitting comfortably in bedside chair. Heart: RRR. Chest: No labored breathing or cough Abdomen: Soft.  Not tender.  Active bowel sounds. Extremities: No CCE Neuro/Psych: Oriented x3.  Moves all 4 limbs.  No tremor.  Flat affect, seems depressed.  Intake/Output from previous day: 12/05 0701 - 12/06 0700 In: 240 [P.O.:240] Out: -   Intake/Output this shift: No intake/output data recorded.  Lab Results: Recent Labs    11/09/21 0727 11/10/21 1056  WBC 8.2 9.5  HGB 10.5* 10.8*  HCT 33.0* 34.2*  PLT 122* 150   BMET Recent Labs    11/09/21 0727 11/10/21 1056 11/11/21 0135  NA 132* 136 133*  K 3.7 4.0 3.9  CL 104 107 107  CO2 18* 24 19*  GLUCOSE 123* 112* 108*  BUN 29* 27* 24*  CREATININE 1.39* 1.29* 1.14*  CALCIUM 8.5* 8.6* 8.3*   LFT Recent Labs    11/09/21 0727 11/10/21 1056  PROT 5.4* 5.8*  ALBUMIN 2.3* 2.2*  AST 16 14*  ALT 10 8  ALKPHOS 49 59  BILITOT 0.2* 0.3   PT/INR No results for input(s): LABPROT, INR in the last 72 hours. Hepatitis Panel No results for input(s): HEPBSAG, HCVAB, HEPAIGM, HEPBIGM in the last 72  hours.  Studies/Results: US RENAL  Result Date: 11/10/2021 CLINICAL DATA:  Left renal mass. EXAM: RENAL / URINARY TRACT ULTRASOUND COMPLETE COMPARISON:  11/07/2021. FINDINGS: Right Kidney: Renal measurements: 10.1 x 4.5 x 5.2 cm = volume: 124.93 mL. Increased parenchymal echogenicity. Multiple cysts are identified, the largest simple cyst in the upper pole measuring 2.2 x 1.9 x 2.1 cm. No mass or hydronephrosis visualized. Left Kidney: Renal measurements: 10.5 x 5.0 x 5.5 cm = volume: 150.08 mL. Echogenicity within normal limits. Multiple simple cysts are identified, the largest in the upper pole measuring 5.2 x 4.3 x 4.1 cm. Bladder: Appears normal for degree of bladder distention. Other: None. IMPRESSION: 1. Bilateral renal cysts.  No mass is identified. 2. Increased renal echogenicity on the right, may be associated with medical renal disease. Electronically Signed   By: Brett Fairy M.D.   On: 11/10/2021 20:35    Scheduled Meds:  aspirin EC  81 mg Oral Daily   atorvastatin  40 mg Per NG tube Daily   dicyclomine  10 mg Per NG tube BID   enoxaparin (LOVENOX) injection  30 mg Subcutaneous Q24H   feeding supplement (PROSource TF)  45 mL Per Tube BID   gabapentin  300 mg Per Tube QHS   lipase/protease/amylase  36,000  Units Oral TID AC   mirtazapine  15 mg Per NG tube QHS   multivitamin with minerals  1 tablet Per Tube Daily   nicotine  14 mg Transdermal Q24H   ondansetron  4 mg Oral Q6H   Or   ondansetron (ZOFRAN) IV  4 mg Intravenous Q6H   pantoprazole sodium  40 mg Per Tube Daily   senna-docusate  1 tablet Per NG tube BID   sucralfate  1 g Per NG tube TID WC & HS   verapamil  240 mg Oral Daily   Continuous Infusions:  sodium chloride 75 mL/hr at 11/10/21 1033   feeding supplement (VITAL 1.5 CAL) 1,000 mL (11/10/21 1630)   PRN Meds:.acetaminophen **OR** acetaminophen, albuterol, bisacodyl, fentaNYL (SUBLIMAZE) injection, hydrALAZINE, oxyCODONE, polyethylene glycol   ASSESMENT:      Abdomiinal pain, anorexia, wt loss, PCM.  Now on Coretrak feedings w persistent sitophobia.  11/05/2021 EGD with gastritis, nummular esophageal lesions.  Biopsies of duodenum, stomach, esophagus unrevealing..  CT with kidney mass and cystic pancreatic tail lesion, likely IPMN.  Pt not able to tolerate MRI.  Outpt Endoscopic [place] being considered.   Current meds include scheduled Carafate, twice daily Protonix via tube, Bentyl bid  Hyponatremia.  Sodium stable 133.  CKD.    [place] anemia.  FOBT negative.  Left kidney mass.  11/10/2021 renal ultrasound feels cysts bilaterally but no mass.  Right sided increased echo, possible medical renal disease. Constipation.  No bowel movement despite twice daily Senokot.   PLAN      Ordered large dose, 30 mL, milk of magnesia once.  Encourage patient to try eating.    Linda [name]  11/11/2021, 12:18 PM Phone [phone]

## 2021-11-11 NOTE — Progress Notes (Addendum)
Physical Therapy Treatment Patient Details Name: Linda [name] MRN: [id number] DOB: 1936/11/12 Today's Date: 11/11/2021   History of Present Illness Pt is an 85 y.o. female admitted 11/25 for abdominal pain. PMH: remote breast cancer; HTN; HLD, and PVD    PT Comments    Pt received in bed, agreeable to participation in therapy. She required min guard assist bed mobility, min assist transfers, and min assist ambulation 20' with RW. Pt previously amb with QC but transitioned to RW due to need for increased support. Pt fatigues quickly. She has still been unable to eat and presents with persistent deconditioning. Discharge rec updated to SNF. Pt understands need for continued therapy prior to d/c home.    Recommendations for follow up therapy are one component of a multi-disciplinary discharge planning process, led by the attending physician.  Recommendations may be updated based on patient status, additional functional criteria and insurance authorization.  Follow Up Recommendations  Skilled nursing-short term rehab (<3 hours/day)     Assistance Recommended at Discharge Frequent or constant Supervision/Assistance  Equipment Recommendations  Rolling walker (2 wheels)    Recommendations for Other Services       Precautions / Restrictions Precautions Precautions: Other (comment) Precaution Comments: cortrak     Mobility  Bed Mobility Overal bed mobility: Needs Assistance Bed Mobility: Supine to Sit;Sit to Supine     Supine to sit: Min guard;HOB elevated Sit to supine: Min guard;HOB elevated   General bed mobility comments: +rail, increased time    Transfers Overall transfer level: Needs assistance Equipment used: Rolling walker (2 wheels) Transfers: Sit to/from Stand Sit to Stand: Min assist           General transfer comment: assist to power up, cues for hand placement, increased time    Ambulation/Gait Ambulation/Gait assistance: Min assist Gait Distance (Feet):  20 Feet Assistive device: Rolling walker (2 wheels) Gait Pattern/deviations: Step-through pattern;Decreased stride length;Trunk flexed Gait velocity: decreased Gait velocity interpretation: <1.8 ft/sec, indicate of risk for recurrent falls   General Gait Details: cues for posture, assist for balance and RW management, trunk flexion worsens with fatigue   Stairs             Wheelchair Mobility    Modified Rankin (Stroke Patients Only)       Balance Overall balance assessment: Needs assistance Sitting-balance support: Feet supported;No upper extremity supported Sitting balance-Leahy Scale: Fair     Standing balance support: Bilateral upper extremity supported;During functional activity;Reliant on assistive device for balance Standing balance-Leahy Scale: Poor                              Cognition Arousal/Alertness: Awake/alert Behavior During Therapy: WFL for tasks assessed/performed Overall Cognitive Status: Within Functional Limits for tasks assessed                                          Exercises      General Comments General comments (skin integrity, edema, etc.): fatigues quickly      Pertinent Vitals/Pain Pain Assessment: No/denies pain    Home Living                          Prior Function            PT Goals (current goals can now be found  in the care plan section) Acute Rehab PT Goals Patient Stated Goal: try to eat Progress towards PT goals: Progressing toward goals (slowly)    Frequency    Min 2X/week      PT Plan Discharge plan needs to be updated;Frequency needs to be updated    Co-evaluation              AM-PAC PT "6 Clicks" Mobility   Outcome Measure  Help needed turning from your back to your side while in a flat bed without using bedrails?: A Little Help needed moving from lying on your back to sitting on the side of a flat bed without using bedrails?: A Little Help needed  moving to and from a bed to a chair (including a wheelchair)?: A Little Help needed standing up from a chair using your arms (e.g., wheelchair or bedside chair)?: A Little Help needed to walk in hospital room?: A Little Help needed climbing 3-5 steps with a railing? : A Lot 6 Click Score: 17    End of Session Equipment Utilized During Treatment: Gait belt Activity Tolerance: Patient limited by fatigue Patient left: in bed;with call bell/phone within reach;with bed alarm set Nurse Communication: Mobility status PT Visit Diagnosis: Muscle weakness (generalized) (M62.81);Difficulty in walking, not elsewhere classified (R26.2)     Time: 6147-0929 PT Time Calculation (min) (ACUTE ONLY): 19 min  Charges:  $Gait Training: 8-22 mins                     Lorrin Goodell, PT  Office # [phone] Pager [phone]    Linda [name] 11/11/2021, 8:56 AM

## 2021-11-11 NOTE — Plan of Care (Signed)

## 2021-11-11 NOTE — NC FL2 (Signed)
[place] MEDICAID FL2 LEVEL OF CARE SCREENING TOOL     IDENTIFICATION  Patient Name: Linda [name] Birthdate: 1936-07-30 Sex: female Admission Date (Current Location): 10/31/2021  [facility] and [place] Number:  Herbalist and Address:  [facility] [place], [place] [address] [place],  [address]      Provider Number: [id number]  Attending Physician Name and Address:  Darliss Cheney, MD  Relative Name and Phone Number:       Current Level of Care: Hospital Recommended Level of Care: [place] Prior Approval Number:    Date Approved/Denied:   PASRR Number: [id number]  Discharge Plan: SNF    Current Diagnoses: Patient Active Problem List   Diagnosis Date Noted   Gastritis and gastroduodenitis    Malnutrition of moderate degree 11/03/2021   Decreased oral intake    Renal mass 10/31/2021   Neck pain 11/07/2019   Primary osteoarthritis of both knees 11/07/2019   HLD (hyperlipidemia) 08/15/2019   Abdominal pain 06/16/2019   AKI (acute kidney injury) ([facility]) 06/15/2019   Lower abdominal pain 06/14/2019   Post herpetic neuralgia 11/22/2018   Cardiac murmur 05/16/2018   GERD (gastroesophageal reflux disease) 05/16/2018   Tobacco abuse 09/28/2014   Pancreatitis, acute 09/28/2014   Carotid artery disease ([place]) 12/18/2013   Peripheral vascular disease ([place]) 02/24/2012   Malignant neoplasm of female breast ([place]) 04/23/2008   Essential hypertension 04/23/2008   DIVERTICULOSIS OF COLON 04/23/2008   CONSTIPATION, CHRONIC 04/23/2008    Orientation RESPIRATION BLADDER Height & Weight     Self, Time, Situation, Place  Normal Continent Weight: 59 kg Height:  5\' 1"  (154.9 cm)  BEHAVIORAL SYMPTOMS/MOOD NEUROLOGICAL BOWEL NUTRITION STATUS      Continent Diet (refer to d/c summary)  AMBULATORY STATUS COMMUNICATION OF NEEDS Skin   Extensive Assist Verbally Normal                       Personal Care Assistance Level of Assistance   Bathing, Feeding, Dressing Bathing Assistance: Maximum assistance Feeding assistance: Independent Dressing Assistance: Maximum assistance     Functional Limitations Info  Sight, Hearing, Speech Sight Info: Adequate Hearing Info: Adequate Speech Info: Adequate    SPECIAL CARE FACTORS FREQUENCY  PT (By licensed PT), OT (By licensed OT)     PT Frequency: 5x/week, evaluate and treat OT Frequency: 5x/week, evaluate and treat            Contractures Contractures Info: Not present    Additional Factors Info  Code Status, Allergies Code Status Info: DNR Allergies Info: No Known Allergies           Current Medications (11/11/2021):  This is the current hospital active medication list Current Facility-Administered Medications  Medication Dose Route Frequency Provider Last Rate Last Admin   0.9 %  sodium chloride infusion   Intravenous Continuous Cristescu, Linard Millers, MD 75 mL/hr at 11/10/21 1033 New Bag at 11/10/21 1033   acetaminophen (TYLENOL) tablet 650 mg  650 mg Per NG tube Q6H PRN Cristescu, Linard Millers, MD   650 mg at 11/10/21 0509   Or   acetaminophen (TYLENOL) suppository 650 mg  650 mg Rectal Q6H PRN Cristescu, Mircea G, MD       albuterol (PROVENTIL) (2.5 MG/3ML) 0.083% nebulizer solution 3 mL  3 mL Inhalation Q6H PRN Karmen Bongo, MD       aspirin EC tablet 81 mg  81 mg Oral Daily Darliss Cheney, MD   [phone]  mg at 11/11/21 0850   atorvastatin (LIPITOR) tablet 40 mg  40 mg Per NG tube Daily Cristescu, Linard Millers, MD   40 mg at 11/11/21 0849   bisacodyl (DULCOLAX) EC tablet 5 mg  5 mg Oral Daily PRN Karmen Bongo, MD       dicyclomine (BENTYL) capsule 10 mg  10 mg Per NG tube BID Cristescu, Linard Millers, MD   10 mg at 11/11/21 0851   enoxaparin (LOVENOX) injection 30 mg  30 mg Subcutaneous Q24H Hammons, Kimberly B, RPH   30 mg at 11/10/21 1900   feeding supplement (PROSource TF) liquid 45 mL  45 mL Per Tube BID Oswald Hillock, MD   45 mL at 11/11/21 0851   feeding supplement  (VITAL 1.5 CAL) liquid 1,000 mL  1,000 mL Per Tube Continuous Oswald Hillock, MD 50 mL/hr at 11/10/21 1630 1,000 mL at 11/10/21 1630   fentaNYL (SUBLIMAZE) injection 12.5 mcg  12.5 mcg Intravenous Q2H PRN Karmen Bongo, MD   12.5 mcg at 11/04/21 2106   gabapentin (NEURONTIN) capsule 300 mg  300 mg Per Tube QHS Cristescu, Linard Millers, MD   300 mg at 11/10/21 2207   hydrALAZINE (APRESOLINE) injection 5 mg  5 mg Intravenous Q4H PRN Karmen Bongo, MD   5 mg at 11/03/21 1652   lipase/protease/amylase (CREON) capsule 36,000 Units  36,000 Units Oral TID AC Esterwood, Amy S, PA-C   36,000 Units at 11/11/21 0849   mirtazapine (REMERON) tablet 15 mg  15 mg Per NG tube QHS Cristescu, Linard Millers, MD   15 mg at 11/10/21 2207   multivitamin with minerals tablet 1 tablet  1 tablet Per Tube Daily Cristescu, Linard Millers, MD   1 tablet at 11/11/21 0849   nicotine (NICODERM CQ - dosed in mg/24 hours) patch 14 mg  14 mg Transdermal Q24H Karmen Bongo, MD   14 mg at 11/10/21 1858   ondansetron (ZOFRAN) tablet 4 mg  4 mg Oral Q6H Esterwood, Amy S, PA-C   4 mg at 11/09/21 1804   Or   ondansetron (ZOFRAN) injection 4 mg  4 mg Intravenous Q6H Esterwood, Amy S, PA-C   4 mg at 11/11/21 0557   oxyCODONE (Oxy IR/ROXICODONE) immediate release tablet 5 mg  5 mg Per NG tube Q4H PRN Cristescu, Mircea G, MD   5 mg at 11/08/21 1346   pantoprazole sodium (PROTONIX) 40 mg/20 mL oral suspension 40 mg  40 mg Per Tube Daily Cristescu, Mircea G, MD   40 mg at 11/11/21 0857   polyethylene glycol (MIRALAX / GLYCOLAX) packet 17 g  17 g Per NG tube Daily PRN Cristescu, Mircea G, MD       senna-docusate (Senokot-S) tablet 1 tablet  1 tablet Per NG tube BID Cristescu, Mircea G, MD   1 tablet at 11/11/21 0850   sucralfate (CARAFATE) tablet 1 g  1 g Per NG tube TID WC & HS Cristescu, Linard Millers, MD   1 g at 11/11/21 0849   verapamil (CALAN-SR) CR tablet 240 mg  240 mg Oral Daily Darliss Cheney, MD   240 mg at 11/11/21 8469     Discharge  Medications: Please see discharge summary for a list of discharge medications.  Relevant Imaging Results:  Relevant Lab Results:   Additional Information ss# [id number]  Sharin Mons, RN

## 2021-11-11 NOTE — Progress Notes (Signed)
PROGRESS NOTE    Linda [name]  OHY:[id number] DOB: 03-06-1936 DOA: 10/31/2021 PCP: Sonia Side., FNP   Brief Narrative:  85 year old female with remote history of breast cancer, hypertension, dyslipidemia, peripheral vascular disease, questionable left renal mass came to ED with complaints of lower abdominal pain and discomfort for 4 to 5 weeks.  Worse after eating, poor p.o. intake and ongoing weight loss.  In the ED she was found to have elevated lipase 378, creatinine 1.7 CT abdomen showed left upper pole renal mass larger than 2020 and a cystic lesion in the pancreatic tail.  Gastroenterology was consulted, CT abdomen was being considered to rule out mesenteric ischemia which was deferred due to acute kidney injury.  Endoscopy on 11/30 was unrevealing   Did not tolerate MRI, even after 3 attempts.  Assessment & Plan:   Principal Problem:   Abdominal pain Active Problems:   Malignant neoplasm of female breast ([place])   Essential hypertension   Tobacco abuse   AKI (acute kidney injury) ([place])   HLD (hyperlipidemia)   Renal mass   Malnutrition of moderate degree   Decreased oral intake   Gastritis and gastroduodenitis  Abdominal pain/acute on chronic pancreatitis/chronic moderate to severe protein calorie malnutrition/weight loss: Initially, potential cause of her acute on chronic abdominal pain was presumed to be vasculopathy/mesenteric ischemia however patient underwent CT angiogram of the abdomen however no celiac axis or SMA stenosis seen on CT angiogram. That study does demonstrate significant changes indicating chronic pancreatitis with pancreatic duct distortion, cause of this unknown.  There is a cystic abnormality 1.6 cm low-attenuation, radiology suspects sequelae of chronic pancreatitis or perhaps developing pseudocyst and cannot exclude neoplasm. Patient's malnutrition currently being treated with core track tube feeds, though the long-term nutrition plan is unclear since  the patient has persistent sitophobia. She will need pain control, continue pancreatic enzyme supplements, encouragement to slowly try a low-fat diet (with the hope of controlling but perhaps not eliminating pain with judicious use of pain meds), and ultimately weaning her from tube feeds.  GI has seen her and they are recommending and perhaps are going to arrange outpatient endoscopic ultrasound to evaluate possible pancreatic neoplasm.  Despite of switching her to a low-fat diet, patient is very hesitant to try the food.  I spent great length of time encouraging her and she said she will try.  Left renal mass: Larger as compared to the imaging in 2020.  Concerning for renal Carcinoma however ultrasound renal rule out mass but did show some bilateral renal cysts.  AKI: Creatinine slowly improving, currently 129.  Continue gentle hydration.  Monitor daily.  Essential hypertension: Blood pressure controlled, continue verapamil.  Hold Cozaar and hydralazine.      DVT prophylaxis: enoxaparin (LOVENOX) injection 30 mg Start: 11/06/21 1700   Code Status: DNR  Family Communication:  None present at bedside.  Plan of care discussed with patient in length and he verbalized understanding and agreed with it.  Updated her daughter April over the phone.  She will also encourage her to eat.  Status is: Inpatient  Remains inpatient appropriate because: Still with abdominal pain and now needs SNF.  Estimated body mass index is 24.58 kg/m as calculated from the following:   Height as of this encounter: 5\' 1"  (1.549 m).   Weight as of this encounter: 59 kg.     Nutritional Assessment: Body mass index is 24.58 kg/m.Marland Kitchen Seen by dietician.  I agree with the assessment and plan as outlined  below: Nutrition Status: Nutrition Problem: Moderate Malnutrition Etiology: chronic illness (PAD) Signs/Symptoms: mild fat depletion, moderate muscle depletion Interventions: Boost Breeze, MVI  .  Skin  Assessment: I have examined the patient's skin and I agree with the wound assessment as performed by the wound care RN as outlined below:    Consultants:  GI  Procedures:  None  Antimicrobials:  Anti-infectives (From admission, onward)    None          Subjective:  Seen and examined.  She states that her abdominal pain is improving but she is still afraid to eat.  Objective: Vitals:   11/10/21 1238 11/10/21 2053 11/11/21 0700 11/11/21 1214  BP: (!) 141/73 (!) 100/54 (!) 98/54 126/61  Pulse: (!) 110 94 98 94  Resp: 20 18 18 18   Temp: 98.1 F (36.7 C) 98.4 F (36.9 C) 98 F (36.7 C) 98 F (36.7 C)  TempSrc:  Oral Oral   SpO2: 96% 90% 91% 100%  Weight:      Height:        Intake/Output Summary (Last 24 hours) at 11/11/2021 1323 Last data filed at 11/10/2021 1500 Gross per 24 hour  Intake 120 ml  Output --  Net 120 ml    Filed Weights   11/07/21 0637 11/08/21 0427 11/09/21 0500  Weight: 61.2 kg 59.6 kg 59 kg    Examination:  General exam: Appears calm and comfortable but extremely cachectic Respiratory system: Clear to auscultation. Respiratory effort normal. Cardiovascular system: S1 & S2 heard, RRR. No JVD, murmurs, rubs, gallops or clicks. No pedal edema. Gastrointestinal system: Abdomen is nondistended, soft and nontender. No organomegaly or masses felt. Normal bowel sounds heard. Central nervous system: Alert and oriented. No focal neurological deficits. Extremities: Symmetric 5 x 5 power. Skin: No rashes, lesions or ulcers.  Psychiatry: Judgement and insight appear normal. Mood & affect appropriate.   Data Reviewed: I have personally reviewed following labs and imaging studies  CBC: Recent Labs  Lab 11/05/21 0246 11/06/21 0244 11/09/21 0727 11/10/21 1056  WBC 7.4 6.2 8.2 9.5  NEUTROABS  --   --   --  7.6  HGB 12.1 12.5 10.5* 10.8*  HCT 37.9 39.9 33.0* 34.2*  MCV 82.6 82.3 83.5 84.7  PLT 138* 141* 122* 720    Basic Metabolic  Panel: Recent Labs  Lab 11/05/21 0246 11/06/21 0244 11/07/21 1641 11/08/21 0553 11/08/21 1609 11/09/21 0727 11/10/21 1056 11/11/21 0135  NA 135 136  --   --   --  132* 136 133*  K 3.9 3.9  --   --   --  3.7 4.0 3.9  CL 106 106  --   --   --  104 107 107  CO2 23 21*  --   --   --  18* 24 19*  GLUCOSE 92 69*  --   --   --  123* 112* 108*  BUN 10 12  --   --   --  29* 27* 24*  CREATININE 1.23* 1.27*  --   --   --  1.39* 1.29* 1.14*  CALCIUM 9.0 9.2  --   --   --  8.5* 8.6* 8.3*  MG  --   --  1.5* 1.6* 1.5* 1.4* 2.1  --   PHOS  --   --  4.1 3.4 2.7 2.8  --   --     GFR: Estimated Creatinine Clearance: 30.9 mL/min (A) (by C-G formula based on SCr of 1.14 mg/dL (H)). Liver  Function Tests: Recent Labs  Lab 11/05/21 0246 11/06/21 0244 11/09/21 0727 11/10/21 1056  AST 18 17 16  14*  ALT 11 11 10 8   ALKPHOS 70 69 49 59  BILITOT 0.8 0.6 0.2* 0.3  PROT 5.8* 6.0* 5.4* 5.8*  ALBUMIN 2.8* 2.9* 2.3* 2.2*    No results for input(s): LIPASE, AMYLASE in the last 168 hours. No results for input(s): AMMONIA in the last 168 hours. Coagulation Profile: No results for input(s): INR, PROTIME in the last 168 hours. Cardiac Enzymes: No results for input(s): CKTOTAL, CKMB, CKMBINDEX, TROPONINI in the last 168 hours. BNP (last 3 results) No results for input(s): PROBNP in the last 8760 hours. HbA1C: No results for input(s): HGBA1C in the last 72 hours. CBG: Recent Labs  Lab 11/10/21 1236 11/10/21 1602 11/10/21 2053 11/11/21 0828 11/11/21 1211  GLUCAP 122* 153* 98 119* 95    Lipid Profile: No results for input(s): CHOL, HDL, LDLCALC, TRIG, CHOLHDL, LDLDIRECT in the last 72 hours. Thyroid Function Tests: No results for input(s): TSH, T4TOTAL, FREET4, T3FREE, THYROIDAB in the last 72 hours. Anemia Panel: No results for input(s): VITAMINB12, FOLATE, FERRITIN, TIBC, IRON, RETICCTPCT in the last 72 hours. Sepsis Labs: No results for input(s): PROCALCITON, LATICACIDVEN in the last 168  hours.  No results found for this or any previous visit (from the past 240 hour(s)).    Radiology Studies: US RENAL  Result Date: 11/10/2021 CLINICAL DATA:  Left renal mass. EXAM: RENAL / URINARY TRACT ULTRASOUND COMPLETE COMPARISON:  11/07/2021. FINDINGS: Right Kidney: Renal measurements: 10.1 x 4.5 x 5.2 cm = volume: 124.93 mL. Increased parenchymal echogenicity. Multiple cysts are identified, the largest simple cyst in the upper pole measuring 2.2 x 1.9 x 2.1 cm. No mass or hydronephrosis visualized. Left Kidney: Renal measurements: 10.5 x 5.0 x 5.5 cm = volume: 150.08 mL. Echogenicity within normal limits. Multiple simple cysts are identified, the largest in the upper pole measuring 5.2 x 4.3 x 4.1 cm. Bladder: Appears normal for degree of bladder distention. Other: None. IMPRESSION: 1. Bilateral renal cysts.  No mass is identified. 2. Increased renal echogenicity on the right, may be associated with medical renal disease. Electronically Signed   By: Brett Fairy M.D.   On: 11/10/2021 20:35    Scheduled Meds:  aspirin EC  81 mg Oral Daily   atorvastatin  40 mg Per NG tube Daily   dicyclomine  10 mg Per NG tube BID   enoxaparin (LOVENOX) injection  30 mg Subcutaneous Q24H   feeding supplement (PROSource TF)  45 mL Per Tube BID   gabapentin  300 mg Per Tube QHS   lipase/protease/amylase  36,000 Units Oral TID AC   magnesium hydroxide  30 mL Oral Once   mirtazapine  15 mg Per NG tube QHS   multivitamin with minerals  1 tablet Per Tube Daily   nicotine  14 mg Transdermal Q24H   ondansetron  4 mg Oral Q6H   Or   ondansetron (ZOFRAN) IV  4 mg Intravenous Q6H   pantoprazole sodium  40 mg Per Tube Daily   senna-docusate  1 tablet Per NG tube BID   sucralfate  1 g Per NG tube TID WC & HS   verapamil  240 mg Oral Daily   Continuous Infusions:  sodium chloride 75 mL/hr at 11/10/21 1033   feeding supplement (VITAL 1.5 CAL) 1,000 mL (11/10/21 1630)     LOS: 10 days   Time spent: 28  minutes   Darliss Cheney, MD  [facility]  11/11/2021, 1:23 PM  Please page via [place] and do not message via secure chat for anything urgent. Secure chat can be used for anything non urgent.  How to contact the [facility] Attending or Consulting provider [place] or covering provider during after hours [place], for this patient?  Check the care team in [facility] and look for a) attending/consulting [facility] provider listed and b) the [facility] team listed. Page or secure chat [phone]. Log into [email or web address] and use [place]'s universal password to access. If you do not have the password, please contact the hospital operator. Locate the [facility] provider you are looking for under [facility] and page to a number that you can be directly reached. If you still have difficulty reaching the provider, please page the [facility] (Director on Call) for the Hospitalists listed on amion for assistance.

## 2021-11-12 DIAGNOSIS — R1013 Epigastric pain: Secondary | ICD-10-CM | POA: Diagnosis not present

## 2021-11-12 LAB — BASIC METABOLIC PANEL
Anion gap: 7 (ref 5–15)
BUN: 21 mg/dL (ref 8–23)
CO2: 23 mmol/L (ref 22–32)
Calcium: 8.5 mg/dL — ABNORMAL LOW (ref 8.9–10.3)
Chloride: 107 mmol/L (ref 98–111)
Creatinine, Ser: 1.21 mg/dL — ABNORMAL HIGH (ref 0.44–1.00)
GFR, Estimated: 44 mL/min — ABNORMAL LOW (ref >60)
Glucose, Bld: 115 mg/dL — ABNORMAL HIGH (ref 70–99)
Potassium: 4.4 mmol/L (ref 3.5–5.1)
Sodium: 137 mmol/L (ref 135–145)

## 2021-11-12 LAB — GLUCOSE, CAPILLARY
Glucose-Capillary: 100 mg/dL — ABNORMAL HIGH (ref 70–99)
Glucose-Capillary: 113 mg/dL — ABNORMAL HIGH (ref 70–99)
Glucose-Capillary: 120 mg/dL — ABNORMAL HIGH (ref 70–99)
Glucose-Capillary: 141 mg/dL — ABNORMAL HIGH (ref 70–99)
Glucose-Capillary: 182 mg/dL — ABNORMAL HIGH (ref 70–99)
Glucose-Capillary: 81 mg/dL (ref 70–99)

## 2021-11-12 NOTE — Progress Notes (Signed)
PROGRESS NOTE    Linda [name]  WSF:[id number] DOB: [birth year] DOA: 10/31/2021 PCP: Sonia Side., FNP   Brief Narrative:  85 year old female with remote history of breast cancer, hypertension, dyslipidemia, peripheral vascular disease, questionable left renal mass came to ED with complaints of lower abdominal pain and discomfort for 4 to 5 weeks.  Worse after eating, poor p.o. intake and ongoing weight loss.  In the ED she was found to have elevated lipase 378, creatinine 1.7 CT abdomen showed left upper pole renal mass larger than 2020 and a cystic lesion in the pancreatic tail.  Gastroenterology was consulted, CT abdomen was being considered to rule out mesenteric ischemia which was deferred due to acute kidney injury.  Endoscopy on 11/30 was unrevealing   Did not tolerate MRI, even after 3 attempts.  Assessment & Plan:   Principal Problem:   Abdominal pain Active Problems:   Malignant neoplasm of female breast ([place])   Essential hypertension   Tobacco abuse   AKI (acute kidney injury) ([place])   HLD (hyperlipidemia)   Renal mass   Malnutrition of moderate degree   Decreased oral intake   Gastritis and gastroduodenitis  Abdominal pain/acute on chronic pancreatitis/chronic moderate to severe protein calorie malnutrition/weight loss: Initially, potential cause of her acute on chronic abdominal pain was presumed to be vasculopathy/mesenteric ischemia however patient underwent CT angiogram of the abdomen however no celiac axis or SMA stenosis seen on CT angiogram. That study does demonstrate significant changes indicating chronic pancreatitis with pancreatic duct distortion, cause of this unknown.  There is a cystic abnormality 1.6 cm low-attenuation, radiology suspects sequelae of chronic pancreatitis or perhaps developing pseudocyst and cannot exclude neoplasm. Patient's malnutrition currently being treated with core track tube feeds, though the long-term nutrition plan is unclear since  the patient has persistent sitophobia. She will need pain control, continue pancreatic enzyme supplements, encouragement to slowly try a low-fat diet (with the hope of controlling but perhaps not eliminating pain with judicious use of pain meds), and ultimately weaning her from tube feeds.  GI has seen her and they are going to arrange outpatient endoscopic ultrasound to evaluate possible pancreatic neoplasm.  She said that her daughter brought some mashed potatoes yesterday which she ate, she just does not like the food here but she promised that she will continue to try.  I have consulted dietitian to assess her nutrition needs so we can start de-escalating her tube feeds.  Left renal mass: Larger as compared to the imaging in 2020.  Concerning for renal Carcinoma however ultrasound renal rule out mass but did show some bilateral renal cysts.  AKI: Creatinine slowly improving, currently 129.  Continue gentle hydration.  Monitor daily.  Essential hypertension: Blood pressure controlled, continue verapamil.  Hold Cozaar and hydralazine.      DVT prophylaxis: enoxaparin (LOVENOX) injection 30 mg Start: 11/06/21 1700   Code Status: DNR  Family Communication:  None present at bedside.    Status is: Inpatient  Remains inpatient appropriate because: Still with abdominal pain and now needs SNF.  Estimated body mass index is 24.58 kg/m as calculated from the following:   Height as of this encounter: 5\' 1"  (1.549 m).   Weight as of this encounter: 59 kg.     Nutritional Assessment: Body mass index is 24.58 kg/m.Marland Kitchen Seen by dietician.  I agree with the assessment and plan as outlined below: Nutrition Status: Nutrition Problem: Moderate Malnutrition Etiology: chronic illness (PAD) Signs/Symptoms: mild fat depletion, moderate muscle depletion  Interventions: Boost Breeze, MVI  .  Skin Assessment: I have examined the patient's skin and I agree with the wound assessment as performed by the  wound care RN as outlined below:    Consultants:  GI  Procedures:  None  Antimicrobials:  Anti-infectives (From admission, onward)    None          Subjective:  Seen and examined.  For the first time, she said that she has no more abdominal pain.  No nausea.  Objective: Vitals:   11/11/21 0700 11/11/21 1214 11/11/21 2017 11/12/21 0741  BP: (!) 98/54 126/61 (!) 117/45 (!) 128/56  Pulse: 98 94 88 (!) 107  Resp: 18 18 18 17   Temp: 98 F (36.7 C) 98 F (36.7 C) 98 F (36.7 C) 98.3 F (36.8 C)  TempSrc: Oral  Oral   SpO2: 91% 100% 100% 100%  Weight:      Height:       No intake or output data in the 24 hours ending 11/12/21 1444  Filed Weights   11/07/21 0637 11/08/21 0427 11/09/21 0500  Weight: 61.2 kg 59.6 kg 59 kg    Examination:  General exam: Appears calm and comfortable, very cachectic Respiratory system: Clear to auscultation. Respiratory effort normal. Cardiovascular system: S1 & S2 heard, RRR. No JVD, murmurs, rubs, gallops or clicks. No pedal edema. Gastrointestinal system: Abdomen is nondistended, soft and nontender. No organomegaly or masses felt. Normal bowel sounds heard. Central nervous system: Alert and oriented. No focal neurological deficits. Extremities: Symmetric 5 x 5 power. Skin: No rashes, lesions or ulcers.  Psychiatry: Judgement and insight appear normal. Mood & affect appropriate.    Data Reviewed: I have personally reviewed following labs and imaging studies  CBC: Recent Labs  Lab 11/06/21 0244 11/09/21 0727 11/10/21 1056  WBC 6.2 8.2 9.5  NEUTROABS  --   --  7.6  HGB 12.5 10.5* 10.8*  HCT 39.9 33.0* 34.2*  MCV 82.3 83.5 84.7  PLT 141* 122* 086    Basic Metabolic Panel: Recent Labs  Lab 11/06/21 0244 11/07/21 1641 11/08/21 0553 11/08/21 1609 11/09/21 0727 11/10/21 1056 11/11/21 0135 11/12/21 0158  NA 136  --   --   --  132* 136 133* 137  K 3.9  --   --   --  3.7 4.0 3.9 4.4  CL 106  --   --   --  104 107 107  107  CO2 21*  --   --   --  18* 24 19* 23  GLUCOSE 69*  --   --   --  123* 112* 108* 115*  BUN 12  --   --   --  29* 27* 24* 21  CREATININE 1.27*  --   --   --  1.39* 1.29* 1.14* 1.21*  CALCIUM 9.2  --   --   --  8.5* 8.6* 8.3* 8.5*  MG  --  1.5* 1.6* 1.5* 1.4* 2.1  --   --   PHOS  --  4.1 3.4 2.7 2.8  --   --   --     GFR: Estimated Creatinine Clearance: 29.1 mL/min (A) (by C-G formula based on SCr of 1.21 mg/dL (H)). Liver Function Tests: Recent Labs  Lab 11/06/21 0244 11/09/21 0727 11/10/21 1056  AST 17 16 14*  ALT 11 10 8   ALKPHOS 69 49 59  BILITOT 0.6 0.2* 0.3  PROT 6.0* 5.4* 5.8*  ALBUMIN 2.9* 2.3* 2.2*    No results  for input(s): LIPASE, AMYLASE in the last 168 hours. No results for input(s): AMMONIA in the last 168 hours. Coagulation Profile: No results for input(s): INR, PROTIME in the last 168 hours. Cardiac Enzymes: No results for input(s): CKTOTAL, CKMB, CKMBINDEX, TROPONINI in the last 168 hours. BNP (last 3 results) No results for input(s): PROBNP in the last 8760 hours. HbA1C: No results for input(s): HGBA1C in the last 72 hours. CBG: Recent Labs  Lab 11/11/21 2004 11/12/21 0036 11/12/21 0333 11/12/21 0852 11/12/21 1225  GLUCAP 120* 113* 81 100* 120*    Lipid Profile: No results for input(s): CHOL, HDL, LDLCALC, TRIG, CHOLHDL, LDLDIRECT in the last 72 hours. Thyroid Function Tests: No results for input(s): TSH, T4TOTAL, FREET4, T3FREE, THYROIDAB in the last 72 hours. Anemia Panel: No results for input(s): VITAMINB12, FOLATE, FERRITIN, TIBC, IRON, RETICCTPCT in the last 72 hours. Sepsis Labs: No results for input(s): PROCALCITON, LATICACIDVEN in the last 168 hours.  No results found for this or any previous visit (from the past 240 hour(s)).    Radiology Studies: US RENAL  Result Date: 11/10/2021 CLINICAL DATA:  Left renal mass. EXAM: RENAL / URINARY TRACT ULTRASOUND COMPLETE COMPARISON:  11/07/2021. FINDINGS: Right Kidney: Renal  measurements: 10.1 x 4.5 x 5.2 cm = volume: 124.93 mL. Increased parenchymal echogenicity. Multiple cysts are identified, the largest simple cyst in the upper pole measuring 2.2 x 1.9 x 2.1 cm. No mass or hydronephrosis visualized. Left Kidney: Renal measurements: 10.5 x 5.0 x 5.5 cm = volume: 150.08 mL. Echogenicity within normal limits. Multiple simple cysts are identified, the largest in the upper pole measuring 5.2 x 4.3 x 4.1 cm. Bladder: Appears normal for degree of bladder distention. Other: None. IMPRESSION: 1. Bilateral renal cysts.  No mass is identified. 2. Increased renal echogenicity on the right, may be associated with medical renal disease. Electronically Signed   By: Brett Fairy M.D.   On: 11/10/2021 20:35    Scheduled Meds:  aspirin EC  81 mg Oral Daily   atorvastatin  40 mg Per NG tube Daily   dicyclomine  10 mg Per NG tube BID   enoxaparin (LOVENOX) injection  30 mg Subcutaneous Q24H   feeding supplement (PROSource TF)  45 mL Per Tube BID   gabapentin  300 mg Per Tube QHS   lipase/protease/amylase  36,000 Units Oral TID AC   mirtazapine  15 mg Per NG tube QHS   multivitamin with minerals  1 tablet Per Tube Daily   nicotine  14 mg Transdermal Q24H   ondansetron  4 mg Oral Q6H   Or   ondansetron (ZOFRAN) IV  4 mg Intravenous Q6H   pantoprazole sodium  40 mg Per Tube Daily   senna-docusate  1 tablet Per NG tube BID   sucralfate  1 g Per NG tube TID WC & HS   verapamil  240 mg Oral Daily   Continuous Infusions:  sodium chloride 75 mL/hr at 11/11/21 1618   feeding supplement (VITAL 1.5 CAL) 1,000 mL (11/10/21 1630)     LOS: 11 days   Time spent: 25 minutes   Darliss Cheney, MD [facility]  11/12/2021, 2:44 PM  Please page via [place] and do not message via secure chat for anything urgent. Secure chat can be used for anything non urgent.  How to contact the [facility] Attending or Consulting provider [place] or covering provider during after hours [place], for this  patient?  Check the care team in [facility] and look for  a) attending/consulting [place] provider listed and b) the [facility] team listed. Page or secure chat [phone]. Log into [email or web address] and use [place]'s universal password to access. If you do not have the password, please contact the hospital operator. Locate the [facility] provider you are looking for under [facility] and page to a number that you can be directly reached. If you still have difficulty reaching the provider, please page the [facility] (Director on Call) for the Hospitalists listed on amion for assistance.

## 2021-11-12 NOTE — Progress Notes (Signed)
Mobility Specialist Progress Note   11/12/21 1045  Mobility  Activity Ambulated in room  Level of Assistance Minimal assist, patient does 75% or more  Assistive Device Front wheel walker  Distance Ambulated (ft) 20 ft  Mobility Ambulated with assistance in room  Mobility Response Tolerated well  Mobility performed by Mobility specialist  $Mobility charge 1 Mobility   Received pt in bed fairly sleepy but agreeable to mobility. Upon sitting EOB pt c/o R heel pain that was new to them but once standing subsided. X1 seated break d/t fatigue, but pt claims they just need to "warm up first". Returned back to EOB w/ call bell by side and all needs met.     Holland Falling Mobility Specialist Phone Number [phone]

## 2021-11-12 NOTE — Progress Notes (Signed)
Mobility Specialist Progress Note   11/12/21 1615  Mobility  Activity Sat and stood x 3;Stood at bedside  Level of Assistance Minimal assist, patient does 75% or more  Assistive Device Front wheel walker  Mobility  (Back in bed for rest)  Mobility Response Tolerated well  Mobility performed by Mobility specialist  $Mobility charge 1 Mobility   Received pt EOB having no complaints and agreeable to mobility. Able perform x3 STS w/ limited trunk flexion, required heavy cues on proper standing posture and hand placement. Returned back to bed w/ call bell by side and all needs met.   Holland Falling Mobility Specialist Phone Number [phone]

## 2021-11-13 DIAGNOSIS — R634 Abnormal weight loss: Secondary | ICD-10-CM | POA: Diagnosis not present

## 2021-11-13 DIAGNOSIS — K2970 Gastritis, unspecified, without bleeding: Secondary | ICD-10-CM | POA: Diagnosis not present

## 2021-11-13 DIAGNOSIS — R1013 Epigastric pain: Secondary | ICD-10-CM | POA: Diagnosis not present

## 2021-11-13 DIAGNOSIS — K2990 Gastroduodenitis, unspecified, without bleeding: Secondary | ICD-10-CM | POA: Diagnosis not present

## 2021-11-13 DIAGNOSIS — R638 Other symptoms and signs concerning food and fluid intake: Secondary | ICD-10-CM | POA: Diagnosis not present

## 2021-11-13 LAB — GLUCOSE, CAPILLARY
Glucose-Capillary: 117 mg/dL — ABNORMAL HIGH (ref 70–99)
Glucose-Capillary: 118 mg/dL — ABNORMAL HIGH (ref 70–99)
Glucose-Capillary: 130 mg/dL — ABNORMAL HIGH (ref 70–99)
Glucose-Capillary: 139 mg/dL — ABNORMAL HIGH (ref 70–99)
Glucose-Capillary: 139 mg/dL — ABNORMAL HIGH (ref 70–99)
Glucose-Capillary: 140 mg/dL — ABNORMAL HIGH (ref 70–99)
Glucose-Capillary: 145 mg/dL — ABNORMAL HIGH (ref 70–99)

## 2021-11-13 MED ORDER — VITAL 1.5 CAL PO LIQD
840.0000 mL | ORAL | Status: DC
  Administered 2021-11-13 – 2021-11-18 (×6): 840 mL
  Filled 2021-11-13 (×10): qty 1000

## 2021-11-13 MED ORDER — PROSOURCE TF PO LIQD
45.0000 mL | Freq: Every day | ORAL | Status: DC
  Administered 2021-11-14 – 2021-11-18 (×5): 45 mL
  Filled 2021-11-13 (×5): qty 45

## 2021-11-13 MED ORDER — ENSURE ENLIVE PO LIQD
237.0000 mL | Freq: Two times a day (BID) | ORAL | Status: DC
  Administered 2021-11-13 – 2021-11-17 (×6): 237 mL via ORAL

## 2021-11-13 NOTE — Progress Notes (Addendum)
Nutrition Follow-up  DOCUMENTATION CODES:   Non-severe (moderate) malnutrition in context of chronic illness  INTERVENTION:  -Initiate calorie count -Recommend liberalizing diet to regular to optimize PO intake -Ensure Enlive po BID, each supplement provides 350 kcal and 20 grams of protein -Transition to nocturnal TF via Cortrak: Vital 1.5 @ 30ml/hr (877ml) 82ml Prosource TF daily  Nocturnal TF provides 1300 kcals, 67 grams protein, 669ml free water (meets ~79% minimum estimated calorie and protein needs)  NUTRITION DIAGNOSIS:   Moderate Malnutrition related to chronic illness (PAD) as evidenced by mild fat depletion, moderate muscle depletion.  ongoing  GOAL:   Patient will meet greater than or equal to 90% of their needs  Addressing with TF and supplements  MONITOR:   Diet advancement, PO intake, Supplement acceptance, Labs, I & O's  REASON FOR ASSESSMENT:   Consult Enteral/tube feeding initiation and management  ASSESSMENT:   85 yo female with a PMH of  breast cancer, hypertension, dyslipidemia, peripheral vascular disease, ? L renal mass, who presented to the ED with lower abdominal pain and discomfort for a few weeks. Admitted with abdominal pain.  12/02 Cortrak placed (gastric tip confirmed via xray)   Pt has been hesitant to eat due to abdominal pain but reports that she is without pain today. Pt also denies nausea. Pt reports that she was able to eat some mashed potatoes brought from home, but states she dislikes the food here but is willing to continue trying to eat more. No PO intake documented since last RD assessment. Will recommend diet be liberalized and initiate calorie count while transitioning pt to nocturnal TF via Cortrak to stimulate appetite.    UOP: 6x unmeasured occurrences x24 hours I/O: +5744ml since admit  Medications: Scheduled Meds:  aspirin EC  81 mg Oral Daily   atorvastatin  40 mg Per NG tube Daily   dicyclomine  10 mg Per NG tube  BID   enoxaparin (LOVENOX) injection  30 mg Subcutaneous Q24H   feeding supplement  237 mL Oral BID BM   [START ON 11/14/2021] feeding supplement (PROSource TF)  45 mL Per Tube Daily   feeding supplement (VITAL 1.5 CAL)  840 mL Per Tube Q24H   gabapentin  300 mg Per Tube QHS   lipase/protease/amylase  36,000 Units Oral TID AC   mirtazapine  15 mg Per NG tube QHS   multivitamin with minerals  1 tablet Per Tube Daily   nicotine  14 mg Transdermal Q24H   ondansetron  4 mg Oral Q6H   Or   ondansetron (ZOFRAN) IV  4 mg Intravenous Q6H   pantoprazole sodium  40 mg Per Tube Daily   senna-docusate  1 tablet Per NG tube BID   sucralfate  1 g Per NG tube TID WC & HS   verapamil  240 mg Oral Daily  Continuous Infusions:  sodium chloride 75 mL/hr at 11/11/21 1618   Labs: Recent Labs  Lab 11/08/21 0553 11/08/21 1609 11/09/21 0727 11/09/21 0727 11/10/21 1056 11/11/21 0135 11/12/21 0158  NA  --   --  132*   < > 136 133* 137  K  --   --  3.7   < > 4.0 3.9 4.4  CL  --   --  104   < > 107 107 107  CO2  --   --  18*   < > 24 19* 23  BUN  --   --  29*   < > 27* 24* 21  CREATININE  --   --  1.39*   < > 1.29* 1.14* 1.21*  CALCIUM  --   --  8.5*   < > 8.6* 8.3* 8.5*  MG 1.6* 1.5* 1.4*  --  2.1  --   --   PHOS 3.4 2.7 2.8  --   --   --   --   GLUCOSE  --   --  123*   < > 112* 108* 115*   < > = values in this interval not displayed.  CBGs: 120-182 x24 hours   Diet Order:   Diet Order             Diet Heart Room service appropriate? Yes; Fluid consistency: Thin  Diet effective now                   EDUCATION NEEDS:   Education needs have been addressed  Skin:  Skin Assessment: Reviewed RN Assessment  Last BM:  no BM documented??  Height:   Ht Readings from Last 1 Encounters:  10/31/21 5\' 1"  (1.549 m)    Weight:   Wt Readings from Last 1 Encounters:  11/09/21 59 kg    BMI:  Body mass index is 24.58 kg/m.  Estimated Nutritional Needs:   Kcal:   1650-1850  Protein:  85-100 grams  Fluid:  >1.65 L     Theone Stanley., MS, RD, LDN (she/her/hers) RD pager number and weekend/on-call pager number located in [place].

## 2021-11-13 NOTE — Progress Notes (Addendum)
     [facility] Progress Note  CC:  Abdominal pain and malnutrition  Subjective:  Is trying to eat.  Says that she ate the inside of a pot-pie last night.  They have started a calorie count on her and she is trying to drink some Ensure.  Says that she is no longer having any abdominal pain, no nausea.  Had a good BM with the MOM on 12/6.  Wants to go home. No chest pain dyspnea or dysuria Objective:  Vital signs in last 24 hours: Temp:  [98.8 F (37.1 C)-100.7 F (38.2 C)] 100 F (37.8 C) (12/08 0759) Pulse Rate:  [77-117] 77 (12/08 0759) Resp:  [16-17] 17 (12/08 0759) BP: (137-162)/(66-72) 143/72 (12/08 0759) SpO2:  [97 %-100 %] 97 % (12/08 0759) Last BM Date:  (Patient unable to recall) General:  Alert, lying in bed in NAD. Heart:  Regular rate and rhythm; no murmurs Pulm:  CTAB.  No W/R/R. Abdomen:  Soft, non-distended.  BS present.  Non-tender. Extremities:  Without edema. Neurologic:  Alert and oriented x 4;  grossly normal neurologically.  BMET Recent Labs    11/11/21 0135 11/12/21 0158  NA 133* 137  K 3.9 4.4  CL 107 107  CO2 19* 23  GLUCOSE 108* 115*  BUN 24* 21  CREATININE 1.14* 1.21*  CALCIUM 8.3* 8.5*   Assessment / Plan: Abdominal pain, anorexia, wt loss, PCM.  Now on Coretrak feedings w persistent sitophobia.  11/05/2021 EGD with gastritis, nummular esophageal lesions. Biopsies of duodenum, stomach, esophagus unrevealing..  CT with kidney mass and cystic pancreatic tail lesion, likely IPMN.  Pt not able to tolerate MRI.  Outpt Endoscopic [place] being considered.   Current meds include scheduled Carafate, twice daily Protonix via tube, Bentyl bid.  Is on Creon with meals.   Hyponatremia.  Sodium stable 133.   CKD.    Sterling anemia.  FOBT negative.   Left kidney mass.  11/10/2021 renal ultrasound feels cysts bilaterally but no mass.  Right sided increased echo, possible medical renal disease. Constipation.  No bowel movement despite twice daily  Senokot.  Was given a large dose of MOM on 12/6 and had a good BM.  -Agree with calorie counts and Ensures. -Not sure what else there is to offer from a GI standpoint.    LOS: 12 days   Laban Emperor. Zehr  11/13/2021, 1:01 PM  I have discussed the case with the APP, and that is the plan I formulated. I personally interviewed and examined the patient.  Patient is slowly improved, starting to take oral nutrition and abdominal pain reportedly improved.  Continue pancreatic enzyme supplements, and increase to 72,000 units with each meal  We will arrange outpatient follow-up at our office with Dr. Tarri Glenn for ongoing management of her chronic pancreatitis and consideration of outpatient EUS regarding imaging findings. Signing off, please call if needed.  Nelida Meuse III Office: [phone]

## 2021-11-13 NOTE — Plan of Care (Signed)

## 2021-11-13 NOTE — Progress Notes (Signed)
PROGRESS NOTE    Linda [name]  ZDG:[id number] DOB: [birth year] DOA: 10/31/2021 PCP: Linda Side., FNP   Brief Narrative:  85 year old female with remote history of breast cancer, hypertension, dyslipidemia, peripheral vascular disease, questionable left renal mass came to ED with complaints of lower abdominal pain and discomfort for 4 to 5 weeks.  Worse after eating, poor p.o. intake and ongoing weight loss.  In the ED she was found to have elevated lipase 378, creatinine 1.7 CT abdomen showed left upper pole renal mass larger than 2020 and a cystic lesion in the pancreatic tail.  Gastroenterology was consulted, CT abdomen was being considered to rule out mesenteric ischemia which was deferred due to acute kidney injury.  Endoscopy on 11/30 was unrevealing   Did not tolerate MRI, even after 3 attempts.  Assessment & Plan:   Principal Problem:   Abdominal pain Active Problems:   Malignant neoplasm of female breast (Linda [name])   Essential hypertension   Tobacco abuse   AKI (acute kidney injury) (Linda [name])   HLD (hyperlipidemia)   Renal mass   Malnutrition of moderate degree   Decreased oral intake   Gastritis and gastroduodenitis  Abdominal pain/acute on chronic pancreatitis/chronic moderate to severe protein calorie malnutrition/weight loss: Initially, potential cause of her acute on chronic abdominal pain was presumed to be vasculopathy/mesenteric ischemia however patient underwent CT angiogram of the abdomen however no celiac axis or SMA stenosis seen on CT angiogram. That study does demonstrate significant changes indicating chronic pancreatitis with pancreatic duct distortion, cause of this unknown.  There is a cystic abnormality 1.6 cm low-attenuation, radiology suspects sequelae of chronic pancreatitis or perhaps developing pseudocyst and cannot exclude neoplasm. Patient's malnutrition currently being treated with core track tube feeds, though the long-term nutrition plan is unclear since  the patient has persistent sitophobia. She will need pain control, continue pancreatic enzyme supplements, encouragement to slowly try a low-fat diet (with the hope of controlling but perhaps not eliminating pain with judicious use of pain meds), and ultimately weaning her from tube feeds.  GI has seen her and they are going to arrange outpatient endoscopic ultrasound to evaluate possible pancreatic neoplasm.  She says that she has a started to eat some.  She really is eager to go home.  I informed her that we need to make sure that she is taking enough p.o. intake so we can take the tube out and stop tube feedings before we can send her home.  I have consulted nutrition today to assess her nutritional needs and intake.  Left renal mass: Larger as compared to the imaging in 2020.  Concerning for renal Carcinoma however ultrasound renal rule out mass but did show some bilateral renal cysts.  AKI: Creatinine slowly improving, currently 129.  Continue gentle hydration.  Monitor daily.  Essential hypertension: Blood pressure controlled, continue verapamil.  Hold Cozaar and hydralazine.      DVT prophylaxis: enoxaparin (LOVENOX) injection 30 mg Start: 11/06/21 1700   Code Status: DNR  Family Communication:  None present at bedside.  Patient eager to go home tomorrow.  PT however has recommended SNF.  We will call her daughter later today to see if she agrees with patient's decision to go home tomorrow.  Status is: Inpatient  Remains inpatient appropriate because:   Estimated body mass index is 24.58 kg/m as calculated from the following:   Height as of this encounter: 5\' 1"  (1.549 m).   Weight as of this encounter: 59 kg.  Nutritional Assessment: Body mass index is 24.58 kg/m.Marland Kitchen Seen by dietician.  I agree with the assessment and plan as outlined below: Nutrition Status: Nutrition Problem: Moderate Malnutrition Etiology: chronic illness (PAD) Signs/Symptoms: mild fat depletion, moderate  muscle depletion Interventions: Ensure Enlive (each supplement provides 350kcal and 20 grams of protein), Tube feeding, Liberalize Diet, Prostat, Calorie Count  .  Skin Assessment: I have examined the patient's skin and I agree with the wound assessment as performed by the wound care RN as outlined below:    Consultants:  GI  Procedures:  None  Antimicrobials:  Anti-infectives (From admission, onward)    None          Subjective:  In and examined.  No abdominal pain.  No nausea.  Wants to go home.  She claims that she is eating more now.  Objective: Vitals:   11/12/21 2134 11/12/21 2300 11/13/21 0200 11/13/21 0759  BP: (!) 162/70 137/66  (!) 143/72  Pulse: (!) 117 (!) 105 (!) 103 77  Resp: 16 16  17   Temp:  (!) 100.7 F (38.2 C) 98.8 F (37.1 C) 100 F (37.8 C)  TempSrc:  Oral  Oral  SpO2: 100% 100%  97%  Weight:      Height:       No intake or output data in the 24 hours ending 11/13/21 1337  Filed Weights   11/07/21 0637 11/08/21 0427 11/09/21 0500  Weight: 61.2 kg 59.6 kg 59 kg    Examination:  General exam: Appears calm and comfortable, cachectic Respiratory system: Clear to auscultation. Respiratory effort normal. Cardiovascular system: S1 & S2 heard, RRR. No JVD, murmurs, rubs, gallops or clicks. No pedal edema. Gastrointestinal system: Abdomen is nondistended, soft and nontender. No organomegaly or masses felt. Normal bowel sounds heard. Central nervous system: Alert and oriented. No focal neurological deficits. Extremities: Symmetric 5 x 5 power. Skin: No rashes, lesions or ulcers.  Psychiatry: Judgement and insight appear normal. Mood & affect appropriate.   Data Reviewed: I have personally reviewed following labs and imaging studies  CBC: Recent Labs  Lab 11/09/21 0727 11/10/21 1056  WBC 8.2 9.5  NEUTROABS  --  7.6  HGB 10.5* 10.8*  HCT 33.0* 34.2*  MCV 83.5 84.7  PLT 122* 678    Basic Metabolic Panel: Recent Labs  Lab  11/07/21 1641 11/08/21 0553 11/08/21 1609 11/09/21 0727 11/10/21 1056 11/11/21 0135 11/12/21 0158  NA  --   --   --  132* 136 133* 137  K  --   --   --  3.7 4.0 3.9 4.4  CL  --   --   --  104 107 107 107  CO2  --   --   --  18* 24 19* 23  GLUCOSE  --   --   --  123* 112* 108* 115*  BUN  --   --   --  29* 27* 24* 21  CREATININE  --   --   --  1.39* 1.29* 1.14* 1.21*  CALCIUM  --   --   --  8.5* 8.6* 8.3* 8.5*  MG 1.5* 1.6* 1.5* 1.4* 2.1  --   --   PHOS 4.1 3.4 2.7 2.8  --   --   --     GFR: Estimated Creatinine Clearance: 29.1 mL/min (A) (by C-G formula based on SCr of 1.21 mg/dL (H)). Liver Function Tests: Recent Labs  Lab 11/09/21 0727 11/10/21 1056  AST 16 14*  ALT 10 8  ALKPHOS 49 59  BILITOT 0.2* 0.3  PROT 5.4* 5.8*  ALBUMIN 2.3* 2.2*    No results for input(s): LIPASE, AMYLASE in the last 168 hours. No results for input(s): AMMONIA in the last 168 hours. Coagulation Profile: No results for input(s): INR, PROTIME in the last 168 hours. Cardiac Enzymes: No results for input(s): CKTOTAL, CKMB, CKMBINDEX, TROPONINI in the last 168 hours. BNP (last 3 results) No results for input(s): PROBNP in the last 8760 hours. HbA1C: No results for input(s): HGBA1C in the last 72 hours. CBG: Recent Labs  Lab 11/12/21 2131 11/13/21 0017 11/13/21 0358 11/13/21 0754 11/13/21 1329  GLUCAP 141* 145* 130* 139* 118*    Lipid Profile: No results for input(s): CHOL, HDL, LDLCALC, TRIG, CHOLHDL, LDLDIRECT in the last 72 hours. Thyroid Function Tests: No results for input(s): TSH, T4TOTAL, FREET4, T3FREE, THYROIDAB in the last 72 hours. Anemia Panel: No results for input(s): VITAMINB12, FOLATE, FERRITIN, TIBC, IRON, RETICCTPCT in the last 72 hours. Sepsis Labs: No results for input(s): PROCALCITON, LATICACIDVEN in the last 168 hours.  No results found for this or any previous visit (from the past 240 hour(s)).    Radiology Studies: No results found.  Scheduled Meds:   aspirin EC  81 mg Oral Daily   atorvastatin  40 mg Per NG tube Daily   dicyclomine  10 mg Per NG tube BID   enoxaparin (LOVENOX) injection  30 mg Subcutaneous Q24H   feeding supplement  237 mL Oral BID BM   [START ON 11/14/2021] feeding supplement (PROSource TF)  45 mL Per Tube Daily   feeding supplement (VITAL 1.5 CAL)  840 mL Per Tube Q24H   gabapentin  300 mg Per Tube QHS   lipase/protease/amylase  36,000 Units Oral TID AC   mirtazapine  15 mg Per NG tube QHS   multivitamin with minerals  1 tablet Per Tube Daily   nicotine  14 mg Transdermal Q24H   ondansetron  4 mg Oral Q6H   Or   ondansetron (ZOFRAN) IV  4 mg Intravenous Q6H   pantoprazole sodium  40 mg Per Tube Daily   senna-docusate  1 tablet Per NG tube BID   sucralfate  1 g Per NG tube TID WC & HS   verapamil  240 mg Oral Daily   Continuous Infusions:  sodium chloride 75 mL/hr at 11/11/21 1618     LOS: 12 days   Time spent: 25 minutes   Darliss Cheney, MD [facility]  11/13/2021, 1:37 PM  Please page via Amion and do not message via secure chat for anything urgent. Secure chat can be used for anything non urgent.  How to contact the [facility] Attending or Consulting provider [place] or covering provider during after hours [place], for this patient?  Check the care team in [facility] and look for a) attending/consulting [facility] provider listed and b) the [facility] team listed. Page or secure chat [phone]. Log into [email or web address] and use [place]'s universal password to access. If you do not have the password, please contact the hospital operator. Locate the [facility] provider you are looking for under [facility] and page to a number that you can be directly reached. If you still have difficulty reaching the provider, please page the [facility] (Director on Call) for the Hospitalists listed on amion for assistance.

## 2021-11-14 DIAGNOSIS — R1013 Epigastric pain: Secondary | ICD-10-CM | POA: Diagnosis not present

## 2021-11-14 LAB — GLUCOSE, CAPILLARY
Glucose-Capillary: 115 mg/dL — ABNORMAL HIGH (ref 70–99)
Glucose-Capillary: 112 mg/dL — ABNORMAL HIGH (ref 70–99)
Glucose-Capillary: 94 mg/dL (ref 70–99)
Glucose-Capillary: 101 mg/dL — ABNORMAL HIGH (ref 70–99)
Glucose-Capillary: 127 mg/dL — ABNORMAL HIGH (ref 70–99)

## 2021-11-14 MED ORDER — PROSOURCE PLUS PO LIQD
30.0000 mL | Freq: Two times a day (BID) | ORAL | Status: DC
  Administered 2021-11-14 – 2021-11-20 (×9): 30 mL via ORAL
  Filled 2021-11-14 (×10): qty 30

## 2021-11-14 NOTE — Plan of Care (Signed)

## 2021-11-14 NOTE — Plan of Care (Signed)

## 2021-11-14 NOTE — Progress Notes (Signed)
PROGRESS NOTE    Linda [name]  JQB:[id number] DOB: November 09, 1936 DOA: 10/31/2021 PCP: Sonia Side., FNP   Brief Narrative:  85 year old female with remote history of breast cancer, hypertension, dyslipidemia, peripheral vascular disease, questionable left renal mass came to ED with complaints of lower abdominal pain and discomfort for 4 to 5 weeks.  Worse after eating, poor p.o. intake and ongoing weight loss.  In the ED she was found to have elevated lipase 378, creatinine 1.7 CT abdomen showed left upper pole renal mass larger than 2020 and a cystic lesion in the pancreatic tail.  Gastroenterology was consulted, CT abdomen was being considered to rule out mesenteric ischemia which was deferred due to acute kidney injury.  Endoscopy on 11/30 was unrevealing   Did not tolerate MRI, even after 3 attempts.  Assessment & Plan:   Principal Problem:   Abdominal pain Active Problems:   Malignant neoplasm of female breast ([place])   Essential hypertension   Tobacco abuse   AKI (acute kidney injury) ([place])   HLD (hyperlipidemia)   Renal mass   Malnutrition of moderate degree   Decreased oral intake   Gastritis and gastroduodenitis  Abdominal pain/acute on chronic pancreatitis/chronic moderate to severe protein calorie malnutrition/weight loss: Initially, potential cause of her acute on chronic abdominal pain was presumed to be vasculopathy/mesenteric ischemia however patient underwent CT angiogram of the abdomen however no celiac axis or SMA stenosis seen on CT angiogram. That study does demonstrate significant changes indicating chronic pancreatitis with pancreatic duct distortion, cause of this unknown.  There is a cystic abnormality 1.6 cm low-attenuation, radiology suspects sequelae of chronic pancreatitis or perhaps developing pseudocyst and cannot exclude neoplasm. Patient's malnutrition currently being treated with core track tube feeds, though the long-term nutrition plan is unclear since  the patient has persistent sitophobia. She will need pain control, continue pancreatic enzyme supplements, encouragement to slowly try a low-fat diet (with the hope of controlling but perhaps not eliminating pain with judicious use of pain meds), and ultimately weaning her from tube feeds.  GI has seen her and they are going to arrange outpatient endoscopic ultrasound to evaluate possible pancreatic neoplasm.  She started some day before yesterday but reportedly, she ate 0% of all her 3 meals despite of her holding her tube feedings and per nutrition, she has made only less than 5% of her goal.  She is nowhere close to stopping the tube feedings.  Left renal mass: Larger as compared to the imaging in 2020.  Concerning for renal Carcinoma however ultrasound renal rule out mass but did show some bilateral renal cysts.  AKI: Creatinine slowly improving, currently 129.  Continue gentle hydration.  Monitor daily.  Essential hypertension: Slight rise in blood pressure today but is still reasonably controlled. continue verapamil.  Hold Cozaar and hydralazine.    DVT prophylaxis: enoxaparin (LOVENOX) injection 30 mg Start: 11/06/21 1700   Code Status: DNR  Family Communication:  None present at bedside.    Status is: Inpatient  Remains inpatient appropriate because:   Estimated body mass index is 24.58 kg/m as calculated from the following:   Height as of this encounter: 5\' 1"  (1.549 m).   Weight as of this encounter: 59 kg.     Nutritional Assessment: Body mass index is 24.58 kg/m.Mar[place] Kitchen Seen by dietician.  I agree with the assessment and plan as outlined below: Nutrition Status: Nutrition Problem: Moderate Malnutrition Etiology: chronic illness (PAD) Signs/Symptoms: mild fat depletion, moderate muscle depletion Interventions: Ensure Enlive (  each supplement provides 350kcal and 20 grams of protein), Tube feeding, Liberalize Diet, Prostat, Calorie Count  .  Skin Assessment: I have examined  the patient's skin and I agree with the wound assessment as performed by the wound care RN as outlined below:    Consultants:  GI  Procedures:  None  Antimicrobials:  Anti-infectives (From admission, onward)    None          Subjective:  Seen and examined.  No complaints.  Once again she is eager to go home.  Objective: Vitals:   11/14/21 0505 11/14/21 0749 11/14/21 1202 11/14/21 1233  BP: 140/61 (!) 162/72 (!) 151/67 (!) 155/75  Pulse:  100 (!) 106 100  Resp: 16 18 18 18   Temp: 99.2 F (37.3 C) 99.2 F (37.3 C) 98.8 F (37.1 C) 98.7 F (37.1 C)  TempSrc: Oral Oral Oral Oral  SpO2: 94% 95% 98% 100%  Weight:      Height:       No intake or output data in the 24 hours ending 11/14/21 1326  Filed Weights   11/07/21 0637 11/08/21 0427 11/09/21 0500  Weight: 61.2 kg 59.6 kg 59 kg    Examination:  General exam: Appears calm and comfortable, cachectic Respiratory system: Clear to auscultation. Respiratory effort normal. Cardiovascular system: S1 & S2 heard, RRR. No JVD, murmurs, rubs, gallops or clicks. No pedal edema. Gastrointestinal system: Abdomen is nondistended, soft and nontender. No organomegaly or masses felt. Normal bowel sounds heard. Central nervous system: Alert and oriented. No focal neurological deficits. Extremities: Symmetric 5 x 5 power. Skin: No rashes, lesions or ulcers.  Psychiatry: Judgement and insight appear normal. Mood & affect appropriate.    Data Reviewed: I have personally reviewed following labs and imaging studies  CBC: Recent Labs  Lab 11/09/21 0727 11/10/21 1056  WBC 8.2 9.5  NEUTROABS  --  7.6  HGB 10.5* 10.8*  HCT 33.0* 34.2*  MCV 83.5 84.7  PLT 122* 867    Basic Metabolic Panel: Recent Labs  Lab 11/07/21 1641 11/08/21 0553 11/08/21 1609 11/09/21 0727 11/10/21 1056 11/11/21 0135 11/12/21 0158  NA  --   --   --  132* 136 133* 137  K  --   --   --  3.7 4.0 3.9 4.4  CL  --   --   --  104 107 107 107  CO2   --   --   --  18* 24 19* 23  GLUCOSE  --   --   --  123* 112* 108* 115*  BUN  --   --   --  29* 27* 24* 21  CREATININE  --   --   --  1.39* 1.29* 1.14* 1.21*  CALCIUM  --   --   --  8.5* 8.6* 8.3* 8.5*  MG 1.5* 1.6* 1.5* 1.4* 2.1  --   --   PHOS 4.1 3.4 2.7 2.8  --   --   --     GFR: Estimated Creatinine Clearance: 29.1 mL/min (A) (by C-G formula based on SCr of 1.21 mg/dL (H)). Liver Function Tests: Recent Labs  Lab 11/09/21 0727 11/10/21 1056  AST 16 14*  ALT 10 8  ALKPHOS 49 59  BILITOT 0.2* 0.3  PROT 5.4* 5.8*  ALBUMIN 2.3* 2.2*    No results for input(s): LIPASE, AMYLASE in the last 168 hours. No results for input(s): AMMONIA in the last 168 hours. Coagulation Profile: No results for input(s): INR, PROTIME in  the last 168 hours. Cardiac Enzymes: No results for input(s): CKTOTAL, CKMB, CKMBINDEX, TROPONINI in the last 168 hours. BNP (last 3 results) No results for input(s): PROBNP in the last 8760 hours. HbA1C: No results for input(s): HGBA1C in the last 72 hours. CBG: Recent Labs  Lab 11/13/21 2031 11/13/21 2317 11/14/21 0502 11/14/21 0750 11/14/21 1149  GLUCAP 139* 140* 115* 112* 94    Lipid Profile: No results for input(s): CHOL, HDL, LDLCALC, TRIG, CHOLHDL, LDLDIRECT in the last 72 hours. Thyroid Function Tests: No results for input(s): TSH, T4TOTAL, FREET4, T3FREE, THYROIDAB in the last 72 hours. Anemia Panel: No results for input(s): VITAMINB12, FOLATE, FERRITIN, TIBC, IRON, RETICCTPCT in the last 72 hours. Sepsis Labs: No results for input(s): PROCALCITON, LATICACIDVEN in the last 168 hours.  No results found for this or any previous visit (from the past 240 hour(s)).    Radiology Studies: No results found.  Scheduled Meds:  (feeding supplement) PROSource Plus  30 mL Oral BID BM   aspirin EC  81 mg Oral Daily   atorvastatin  40 mg Per NG tube Daily   dicyclomine  10 mg Per NG tube BID   enoxaparin (LOVENOX) injection  30 mg Subcutaneous Q24H    feeding supplement  237 mL Oral BID BM   feeding supplement (PROSource TF)  45 mL Per Tube Daily   feeding supplement (VITAL 1.5 CAL)  840 mL Per Tube Q24H   gabapentin  300 mg Per Tube QHS   lipase/protease/amylase  36,000 Units Oral TID AC   mirtazapine  15 mg Per NG tube QHS   multivitamin with minerals  1 tablet Per Tube Daily   nicotine  14 mg Transdermal Q24H   ondansetron  4 mg Oral Q6H   Or   ondansetron (ZOFRAN) IV  4 mg Intravenous Q6H   pantoprazole sodium  40 mg Per Tube Daily   senna-docusate  1 tablet Per NG tube BID   sucralfate  1 g Per NG tube TID WC & HS   verapamil  240 mg Oral Daily   Continuous Infusions:  sodium chloride 75 mL/hr at 11/11/21 1618     LOS: 13 days   Time spent: 26 minutes   Darliss Cheney, MD [facility]  11/14/2021, 1:26 PM  Please page via [place] and do not message via secure chat for anything urgent. Secure chat can be used for anything non urgent.  How to contact the [facility] Attending or Consulting provider [place] or covering provider during after hours [employer], for this patient?  Check the care team in [facility] and look for a) attending/consulting TRH provider listed and b) the [facility] team listed. Page or secure chat [phone]. Log into [email or web address] and use [place]'s universal password to access. If you do not have the password, please contact the hospital operator. Locate the [facility] provider you are looking for under [facility] and page to a number that you can be directly reached. If you still have difficulty reaching the provider, please page the [facility] (Director on Call) for the Hospitalists listed on amion for assistance.

## 2021-11-14 NOTE — Progress Notes (Signed)
Calorie Count Note  48-72 hour calorie count ordered.  Diet: regular Supplements: Ensure Enlive BID  No meal tickets were kept over first day of calorie count. Discussed with pt who reports she was not able to eat anything at all yesterday outside of drinking ~1/2 of her Ensure. Reviewed options for optimizing PO intake with pt and encouraged her to eat more PO.   NUTRITION DIAGNOSIS:  Moderate Malnutrition related to chronic illness (PAD) as evidenced by mild fat depletion, moderate muscle depletion.-- ongoing   GOAL:  Patient will meet greater than or equal to 90% of their needs --addressing with TF and supplements  INTERVENTION:  -Continue calorie count, RD will follow-up with results on Monday, 12/12 -Continue regular diet  -Continue Ensure Enlive po BID, each supplement provides 350 kcal and 20 grams of protein -PROSource PLUS PO 25mls BID, each supplement provides 100 kcals and 15 grams of protein -transition to assistance with room service -Continue nocturnal TF via Cortrak: Vital 1.5 @ 1ml/hr (839ml) 67ml Prosource TF daily   Nocturnal TF provides 1300 kcals, 67 grams protein, 677ml free water (meets ~79% minimum estimated calorie and protein needs    Linda [name]., MS, RD, LDN (she/her/hers) RD pager number and weekend/on-call pager number located in [place].

## 2021-11-14 NOTE — Progress Notes (Signed)
Physical Therapy Treatment Patient Details Name: Linda [name] MRN: [id number] DOB: 1936-01-10 Today's Date: 11/14/2021   History of Present Illness Pt is an 85 y.o. female admitted 11/25 for abdominal pain. PMH: remote breast cancer; HTN; HLD, and PVD    PT Comments    Pt required min guard assist bed mobility, min assist transfers, and min assist ambulation 10' with RW. Increased time required to complete all functional mobility. Pt continues to present with poor activity tolerance and weakness. Recommending SNF at d/c. If pt declines, she will need HHPT and frequent/constant assist from family. Asked pt if she does choose to go home if a w/c would be needed. She stated 'no.' PT to continue per current POC.   Recommendations for follow up therapy are one component of a multi-disciplinary discharge planning process, led by the attending physician.  Recommendations may be updated based on patient status, additional functional criteria and insurance authorization.  Follow Up Recommendations  Skilled nursing-short term rehab (<3 hours/day)     Assistance Recommended at Discharge Frequent or constant Supervision/Assistance  Equipment Recommendations  Rolling walker (2 wheels)    Recommendations for Other Services       Precautions / Restrictions Precautions Precautions: Other (comment) Precaution Comments: cortrak     Mobility  Bed Mobility Overal bed mobility: Needs Assistance Bed Mobility: Supine to Sit;Sit to Supine     Supine to sit: Min guard;HOB elevated     General bed mobility comments: +rail, increased time    Transfers Overall transfer level: Needs assistance Equipment used: Rolling walker (2 wheels) Transfers: Sit to/from Stand Sit to Stand: Min assist           General transfer comment: assist to power up, cues for hand placement, increased time    Ambulation/Gait Ambulation/Gait assistance: Min assist Gait Distance (Feet): 10 Feet Assistive device:  Rolling walker (2 wheels) Gait Pattern/deviations: Step-through pattern;Decreased stride length;Trunk flexed Gait velocity: decreased Gait velocity interpretation: <1.8 ft/sec, indicate of risk for recurrent falls   General Gait Details: cues for posture, assist for balance and RW management, trunk flexion worsens with fatigue. Distance limited by weakness/fatigue.   Stairs             Wheelchair Mobility    Modified Rankin (Stroke Patients Only)       Balance Overall balance assessment: Needs assistance Sitting-balance support: Feet supported;No upper extremity supported Sitting balance-Leahy Scale: Fair     Standing balance support: Bilateral upper extremity supported;During functional activity;Reliant on assistive device for balance Standing balance-Leahy Scale: Poor                              Cognition Arousal/Alertness: Awake/alert Behavior During Therapy: WFL for tasks assessed/performed Overall Cognitive Status: Within Functional Limits for tasks assessed                                          Exercises      General Comments General comments (skin integrity, edema, etc.): fatigues quickly      Pertinent Vitals/Pain Pain Assessment: Faces Faces Pain Scale: Hurts a little bit Pain Location: generalized Pain Descriptors / Indicators: Discomfort;Grimacing Pain Intervention(s): Repositioned;Limited activity within patient's tolerance;Monitored during session    Home Living  Prior Function            PT Goals (current goals can now be found in the care plan section) Acute Rehab PT Goals Patient Stated Goal: try to eat Progress towards PT goals: Progressing toward goals (slowly)    Frequency    Min 2X/week      PT Plan Current plan remains appropriate    Co-evaluation              AM-PAC PT "6 Clicks" Mobility   Outcome Measure  Help needed turning from your back  to your side while in a flat bed without using bedrails?: A Little Help needed moving from lying on your back to sitting on the side of a flat bed without using bedrails?: A Little Help needed moving to and from a bed to a chair (including a wheelchair)?: A Little Help needed standing up from a chair using your arms (e.g., wheelchair or bedside chair)?: A Little Help needed to walk in hospital room?: A Little Help needed climbing 3-5 steps with a railing? : A Lot 6 Click Score: 17    End of Session Equipment Utilized During Treatment: Gait belt Activity Tolerance: Patient limited by fatigue Patient left: in chair;with call bell/phone within reach;with chair alarm set Nurse Communication: Mobility status PT Visit Diagnosis: Muscle weakness (generalized) (M62.81);Difficulty in walking, not elsewhere classified (R26.2)     Time: 7793-9688 PT Time Calculation (min) (ACUTE ONLY): 12 min  Charges:  $Gait Training: 8-22 mins                     Lorrin Goodell, PT  Office # [phone] Pager [phone]    [name] 11/14/2021, 11:48 AM

## 2021-11-14 NOTE — TOC Progression Note (Signed)
Transition of Care [facility]) - Progression Note    Patient Details  Name: Linda [name] MRN: [id number] Date of Birth: [birth year]  Transition of [facility]) CM/SW Contact  Joanne Chars, LCSW Phone Number: 11/14/2021, 2:14 PM  Clinical Narrative:   CSW spoke with pt in room regarding bed offers.  She would like for her daughter to decide on a SNF choice.  CSW spoke with daughter April by phone, she is still hoping that pt may be able to come home, rather than SNF.  If pt does need SNF, she does want [facility].  TOC will continue to follow for DC needs.     Expected Discharge Plan: Home/Self Care    Expected Discharge Plan and Services Expected Discharge Plan: Home/Self Care   Discharge Planning Services: CM Consult   Living arrangements for the past 2 months: Single Family Home                 DME Arranged: 3-N-1, Walker rolling DME Agency: [employer] Date DME Agency Contacted: 11/10/21 Time DME Agency Contacted: [phone] Representative spoke with at DME Agency: [place] Arranged: PT           Social Determinants of Health ([place]) Interventions    Readmission Risk Interventions No flowsheet data found.

## 2021-11-14 NOTE — Progress Notes (Signed)
Paged On-call RD at [phone] regarding pt's clogged Cortrex tube. Awaiting response at this moment

## 2021-11-15 DIAGNOSIS — R1013 Epigastric pain: Secondary | ICD-10-CM | POA: Diagnosis not present

## 2021-11-15 LAB — GLUCOSE, CAPILLARY
Glucose-Capillary: 119 mg/dL — ABNORMAL HIGH (ref 70–99)
Glucose-Capillary: 124 mg/dL — ABNORMAL HIGH (ref 70–99)
Glucose-Capillary: 134 mg/dL — ABNORMAL HIGH (ref 70–99)
Glucose-Capillary: 137 mg/dL — ABNORMAL HIGH (ref 70–99)
Glucose-Capillary: 138 mg/dL — ABNORMAL HIGH (ref 70–99)
Glucose-Capillary: 87 mg/dL (ref 70–99)
Glucose-Capillary: 94 mg/dL (ref 70–99)

## 2021-11-15 MED ORDER — NYSTATIN 100000 UNIT/ML MT SUSP
5.0000 mL | Freq: Four times a day (QID) | OROMUCOSAL | Status: DC
  Administered 2021-11-15 – 2021-11-21 (×21): 500000 [IU] via OROMUCOSAL
  Filled 2021-11-15 (×22): qty 5

## 2021-11-15 NOTE — Progress Notes (Signed)
PROGRESS NOTE    Linda [name]  NUU:[id number] DOB: 1936/11/08 DOA: 10/31/2021 PCP: Sonia Side., FNP   Brief Narrative:  85 year old female with remote history of breast cancer, hypertension, dyslipidemia, peripheral vascular disease, questionable left renal mass came to ED with complaints of lower abdominal pain and discomfort for 4 to 5 weeks.  Worse after eating, poor p.o. intake and ongoing weight loss.  In the ED she was found to have elevated lipase 378, creatinine 1.7 CT abdomen showed left upper pole renal mass larger than 2020 and a cystic lesion in the pancreatic tail.  Gastroenterology was consulted, CT abdomen was being considered to rule out mesenteric ischemia which was deferred due to acute kidney injury.  Endoscopy on 11/30 was unrevealing   Did not tolerate MRI, even after 3 attempts.  Assessment & Plan:   Principal Problem:   Abdominal pain Active Problems:   Malignant neoplasm of female breast ([place])   Essential hypertension   Tobacco abuse   AKI (acute kidney injury) ([place])   HLD (hyperlipidemia)   Renal mass   Malnutrition of moderate degree   Decreased oral intake   Gastritis and gastroduodenitis  Abdominal pain/acute on chronic pancreatitis/chronic moderate to severe protein calorie malnutrition/weight loss: Initially, potential cause of her acute on chronic abdominal pain was presumed to be vasculopathy/mesenteric ischemia however patient underwent CT angiogram of the abdomen however no celiac axis or SMA stenosis seen on CT angiogram. That study does demonstrate significant changes indicating chronic pancreatitis with pancreatic duct distortion, cause of this unknown.  There is a cystic abnormality 1.6 cm low-attenuation, radiology suspects sequelae of chronic pancreatitis or perhaps developing pseudocyst and cannot exclude neoplasm. Patient's malnutrition currently being treated with core track tube feeds, though the long-term nutrition plan is unclear since  the patient has persistent sitophobia. She will need pain control, continue pancreatic enzyme supplements, encouragement to slowly try a low-fat diet (with the hope of controlling but perhaps not eliminating pain with judicious use of pain meds), and ultimately weaning her from tube feeds.  GI has seen her and they are going to arrange outpatient endoscopic ultrasound to evaluate possible pancreatic neoplasm.  Per patient, she did eat some mac & cheese last night brought to her by her daughter.  She also ate some amount of oatmeal this morning.  Continue nocturnal tube feeds.  Left renal mass: Larger as compared to the imaging in 2020.  Concerning for renal Carcinoma however ultrasound renal rule out mass but did show some bilateral renal cysts.  AKI: Creatinine slowly improving, currently 129.  Continue gentle hydration.  Monitor daily.  Essential hypertension: Slight rise in blood pressure today but is still reasonably controlled. continue verapamil.  Hold Cozaar and hydralazine.    DVT prophylaxis: enoxaparin (LOVENOX) injection 30 mg Start: 11/06/21 1700   Code Status: DNR  Family Communication:  None present at bedside.    Status is: Inpatient  Remains inpatient appropriate because:   Estimated body mass index is 22.95 kg/m as calculated from the following:   Height as of this encounter: _0  (1.549 m).   Weight as of this encounter: 55.1 kg.     Nutritional Assessment: Body mass index is 22.95 kg/m.Marland Kitchen Seen by dietician.  I agree with the assessment and plan as outlined below: Nutrition Status: Nutrition Problem: Moderate Malnutrition Etiology: chronic illness (PAD) Signs/Symptoms: mild fat depletion, moderate muscle depletion Interventions: Ensure Enlive (each supplement provides 350kcal and 20 grams of protein), Tube feeding, Liberalize Diet, Prostat,  Calorie Count  .  Skin Assessment: I have examined the patient's skin and I agree with the wound assessment as performed by  the wound care RN as outlined below:    Consultants:  GI  Procedures:  None  Antimicrobials:  Anti-infectives (From admission, onward)    None          Subjective:  Seen and examined.  She has no complaints.  Objective: Vitals:   11/15/21 0419 11/15/21 0423 11/15/21 0806 11/15/21 1220  BP:  134/61 103/67 (!) 147/89  Pulse:  (!) 110 (!) 110 (!) 107  Resp:  _0 Temp:  100.3 F (37.9 C) 99.4 F (37.4 C) 98.3 F (36.8 C)  TempSrc:  Oral Oral Oral  SpO2:  (!) 89% 93% 98%  Weight: 55.1 kg     Height:       No intake or output data in the 24 hours ending 11/15/21 1308  Filed Weights   11/08/21 0427 11/09/21 0500 11/15/21 0419  Weight: 59.6 kg 59 kg 55.1 kg    Examination:  General exam: Appears calm and comfortable, cachectic Respiratory system: Clear to auscultation. Respiratory effort normal. Cardiovascular system: S1 & S2 heard, RRR. No JVD, murmurs, rubs, gallops or clicks. No pedal edema. Gastrointestinal system: Abdomen is nondistended, soft and nontender. No organomegaly or masses felt. Normal bowel sounds heard. Central nervous system: Alert and oriented. No focal neurological deficits. Extremities: Symmetric 5 x 5 power. Skin: No rashes, lesions or ulcers.  Psychiatry: Judgement and insight appear normal. Mood & affect appropriate.    Data Reviewed: I have personally reviewed following labs and imaging studies  CBC: Recent Labs  Lab 11/09/21 0727 11/10/21 1056  WBC 8.2 9.5  NEUTROABS  --  7.6  HGB 10.5* 10.8*  HCT 33.0* 34.2*  MCV 83.5 84.7  PLT 122* 811    Basic Metabolic Panel: Recent Labs  Lab 11/08/21 1609 11/09/21 0727 11/10/21 1056 11/11/21 0135 11/12/21 0158  NA  --  132* 136 133* 137  K  --  3.7 4.0 3.9 4.4  CL  --  104 107 107 107  CO2  --  18* 24 19* 23  GLUCOSE  --  123* 112* 108* 115*  BUN  --  29* 27* 24* 21  CREATININE  --  1.39* 1.29* 1.14* 1.21*  CALCIUM  --  8.5* 8.6* 8.3* 8.5*  MG 1.5* 1.4* 2.1  --   --    PHOS 2.7 2.8  --   --   --     GFR: Estimated Creatinine Clearance: 26.6 mL/min (A) (by C-G formula based on SCr of 1.21 mg/dL (H)). Liver Function Tests: Recent Labs  Lab 11/09/21 0727 11/10/21 1056  AST 16 14*  ALT 10 8  ALKPHOS 49 59  BILITOT 0.2* 0.3  PROT 5.4* 5.8*  ALBUMIN 2.3* 2.2*    No results for input(s): LIPASE, AMYLASE in the last 168 hours. No results for input(s): AMMONIA in the last 168 hours. Coagulation Profile: No results for input(s): INR, PROTIME in the last 168 hours. Cardiac Enzymes: No results for input(s): CKTOTAL, CKMB, CKMBINDEX, TROPONINI in the last 168 hours. BNP (last 3 results) No results for input(s): PROBNP in the last 8760 hours. HbA1C: No results for input(s): HGBA1C in the last 72 hours. CBG: Recent Labs  Lab 11/14/21 2051 11/15/21 0002 11/15/21 0420 11/15/21 0805 11/15/21 1216  GLUCAP 127* 137* 134* 124* 138*    Lipid Profile: No results for input(s): CHOL, HDL, LDLCALC,  TRIG, CHOLHDL, LDLDIRECT in the last 72 hours. Thyroid Function Tests: No results for input(s): TSH, T4TOTAL, FREET4, T3FREE, THYROIDAB in the last 72 hours. Anemia Panel: No results for input(s): VITAMINB12, FOLATE, FERRITIN, TIBC, IRON, RETICCTPCT in the last 72 hours. Sepsis Labs: No results for input(s): PROCALCITON, LATICACIDVEN in the last 168 hours.  No results found for this or any previous visit (from the past 240 hour(s)).    Radiology Studies: No results found.  Scheduled Meds:  (feeding supplement) PROSource Plus  30 mL Oral BID BM   aspirin EC  81 mg Oral Daily   atorvastatin  40 mg Per NG tube Daily   dicyclomine  10 mg Per NG tube BID   enoxaparin (LOVENOX) injection  30 mg Subcutaneous Q24H   feeding supplement  237 mL Oral BID BM   feeding supplement (PROSource TF)  45 mL Per Tube Daily   feeding supplement (VITAL 1.5 CAL)  840 mL Per Tube Q24H   gabapentin  300 mg Per Tube QHS   lipase/protease/amylase  36,000 Units Oral TID AC    mirtazapine  15 mg Per NG tube QHS   multivitamin with minerals  1 tablet Per Tube Daily   nicotine  14 mg Transdermal Q24H   ondansetron  4 mg Oral Q6H   Or   ondansetron (ZOFRAN) IV  4 mg Intravenous Q6H   pantoprazole sodium  40 mg Per Tube Daily   senna-docusate  1 tablet Per NG tube BID   sucralfate  1 g Per NG tube TID WC & HS   verapamil  240 mg Oral Daily   Continuous Infusions:  sodium chloride 75 mL/hr at 11/11/21 1618     LOS: 14 days   Time spent: 20 minutes   Darliss Cheney, MD [facility]  11/15/2021, 1:08 PM  Please page via Shea Evans and do not message via secure chat for anything urgent. Secure chat can be used for anything non urgent.  How to contact the [facility] Attending or Consulting provider [place] or covering provider during after hours [place], for this patient?  Check the care team in [facility] and look for a) attending/consulting TRH provider listed and b) the [facility] team listed. Page or secure chat [phone]. Log into [email or web address] and use [place]'s universal password to access. If you do not have the password, please contact the hospital operator. Locate the [facility] provider you are looking for under [facility] and page to a number that you can be directly reached. If you still have difficulty reaching the provider, please page the [facility] (Director on Call) for the Hospitalists listed on amion for assistance.

## 2021-11-16 ENCOUNTER — Inpatient Hospital Stay (HOSPITAL_COMMUNITY): Payer: Medicare Other

## 2021-11-16 ENCOUNTER — Other Ambulatory Visit: Payer: Self-pay | Admitting: Emergency Medicine

## 2021-11-16 DIAGNOSIS — R1013 Epigastric pain: Secondary | ICD-10-CM | POA: Diagnosis not present

## 2021-11-16 LAB — BASIC METABOLIC PANEL
Anion gap: 8 (ref 5–15)
BUN: 29 mg/dL — ABNORMAL HIGH (ref 8–23)
CO2: 22 mmol/L (ref 22–32)
Calcium: 8.8 mg/dL — ABNORMAL LOW (ref 8.9–10.3)
Chloride: 107 mmol/L (ref 98–111)
Creatinine, Ser: 1.18 mg/dL — ABNORMAL HIGH (ref 0.44–1.00)
GFR, Estimated: 46 mL/min — ABNORMAL LOW (ref >60)
Glucose, Bld: 142 mg/dL — ABNORMAL HIGH (ref 70–99)
Potassium: 5.3 mmol/L — ABNORMAL HIGH (ref 3.5–5.1)
Sodium: 137 mmol/L (ref 135–145)

## 2021-11-16 LAB — GLUCOSE, CAPILLARY
Glucose-Capillary: 147 mg/dL — ABNORMAL HIGH (ref 70–99)
Glucose-Capillary: 144 mg/dL — ABNORMAL HIGH (ref 70–99)
Glucose-Capillary: 133 mg/dL — ABNORMAL HIGH (ref 70–99)
Glucose-Capillary: 94 mg/dL (ref 70–99)
Glucose-Capillary: 97 mg/dL (ref 70–99)

## 2021-11-16 LAB — CBC WITH DIFFERENTIAL/PLATELET
Abs Immature Granulocytes: 0.06 10*3/uL (ref 0.00–0.07)
Basophils Absolute: 0 10*3/uL (ref 0.0–0.1)
Basophils Relative: 0 %
Eosinophils Absolute: 0.2 10*3/uL (ref 0.0–0.5)
Eosinophils Relative: 2 %
HCT: 27.9 % — ABNORMAL LOW (ref 36.0–46.0)
Hemoglobin: 8.7 g/dL — ABNORMAL LOW (ref 12.0–15.0)
Immature Granulocytes: 1 %
Lymphocytes Relative: 13 %
Lymphs Abs: 1.3 10*3/uL (ref 0.7–4.0)
MCH: 26.3 pg (ref 26.0–34.0)
MCHC: 31.2 g/dL (ref 30.0–36.0)
MCV: 84.3 fL (ref 80.0–100.0)
Monocytes Absolute: 0.9 10*3/uL (ref 0.1–1.0)
Monocytes Relative: 9 %
Neutro Abs: 7.7 10*3/uL (ref 1.7–7.7)
Neutrophils Relative %: 75 %
Platelets: 222 10*3/uL (ref 150–400)
RBC: 3.31 MIL/uL — ABNORMAL LOW (ref 3.87–5.11)
RDW: 15.5 % (ref 11.5–15.5)
WBC: 10.3 10*3/uL (ref 4.0–10.5)
nRBC: 0 % (ref 0.0–0.2)

## 2021-11-16 LAB — LACTIC ACID, PLASMA
Lactic Acid, Venous: 1.4 mmol/L (ref 0.5–1.9)
Lactic Acid, Venous: 1 mmol/L (ref 0.5–1.9)

## 2021-11-16 LAB — MAGNESIUM: Magnesium: 2.1 mg/dL (ref 1.7–2.4)

## 2021-11-16 MED ORDER — METOPROLOL TARTRATE 5 MG/5ML IV SOLN
5.0000 mg | INTRAVENOUS | Status: DC | PRN
  Administered 2021-11-16: 5 mg via INTRAVENOUS
  Filled 2021-11-16: qty 5

## 2021-11-16 MED ORDER — BENZONATATE 100 MG PO CAPS
200.0000 mg | ORAL_CAPSULE | Freq: Three times a day (TID) | ORAL | Status: DC | PRN
  Administered 2021-11-16: 200 mg via ORAL
  Filled 2021-11-16 (×2): qty 2

## 2021-11-16 NOTE — Progress Notes (Signed)
PROGRESS NOTE    Linda [name]  POE:[id number] DOB: 03-10-1936 DOA: 10/31/2021 PCP: Sonia Side., FNP   Brief Narrative:  85 year old female with remote history of breast cancer, hypertension, dyslipidemia, peripheral vascular disease, questionable left renal mass came to ED with complaints of lower abdominal pain and discomfort for 4 to 5 weeks.  Worse after eating, poor p.o. intake and ongoing weight loss.  In the ED she was found to have elevated lipase 378, creatinine 1.7 CT abdomen showed left upper pole renal mass larger than 2020 and a cystic lesion in the pancreatic tail.  Gastroenterology was consulted, CT abdomen was being considered to rule out mesenteric ischemia which was deferred due to acute kidney injury.  Endoscopy on 11/30 was unrevealing   Did not tolerate MRI, even after 3 attempts.  Assessment & Plan:   Principal Problem:   Abdominal pain Active Problems:   Malignant neoplasm of female breast ([place])   Essential hypertension   Tobacco abuse   AKI (acute kidney injury) ([place])   HLD (hyperlipidemia)   Renal mass   Malnutrition of moderate degree   Decreased oral intake   Gastritis and gastroduodenitis  Abdominal pain/acute on chronic pancreatitis/chronic moderate to severe protein calorie malnutrition/weight loss: Initially, potential cause of her acute on chronic abdominal pain was presumed to be vasculopathy/mesenteric ischemia however patient underwent CT angiogram of the abdomen however no celiac axis or SMA stenosis seen on CT angiogram. That study does demonstrate significant changes indicating chronic pancreatitis with pancreatic duct distortion, cause of this unknown.  There is a cystic abnormality 1.6 cm low-attenuation, radiology suspects sequelae of chronic pancreatitis or perhaps developing pseudocyst and cannot exclude neoplasm. Patient's malnutrition currently being treated with core track tube feeds, though the long-term nutrition plan is unclear since  the patient has persistent sitophobia. She will need pain control, continue pancreatic enzyme supplements, encouragement to slowly try a low-fat diet (with the hope of controlling but perhaps not eliminating pain with judicious use of pain meds), and ultimately weaning her from tube feeds.  GI has seen her and they are going to arrange outpatient endoscopic ultrasound to evaluate possible pancreatic neoplasm.  Patient still not eating much.  Continue tube feedings.  I have been encouraging her to eat.   Left renal mass: Larger as compared to the imaging in 2020.  Concerning for renal Carcinoma however ultrasound renal rule out mass but did show some bilateral renal cysts.  AKI: Creatinine has remained stable around 1.2 since last several days, wonder if this is going to be her new baseline, if so, she will fit into criteria of CKD stage IIIa.  Essential hypertension: Very well controlled continue verapamil.  Hold Cozaar and hydralazine.    Hypoxia: Slightly hypoxic, 88% on room air.  But patient denies any shortness of breath.  Has diminished breath sounds at the bases, I suspect atelectasis.  Encourage incentive spirometry.  Will order chest x-ray.  DVT prophylaxis: enoxaparin (LOVENOX) injection 30 mg Start: 11/06/21 1700   Code Status: DNR  Family Communication:  None present at bedside.  Had a lengthy discussion with the daughter.  She is of the opinion that patient will do better with eating at home and she also wants the patient to come home either tomorrow or day after.  Status is: Inpatient  Remains inpatient appropriate because: Inadequate p.o. intake.  Estimated body mass index is 22.91 kg/m as calculated from the following:   Height as of this encounter: 5\' 1"  (1.549  m).   Weight as of this encounter: 55 kg.     Nutritional Assessment: Body mass index is 22.91 kg/m.Marland Kitchen Seen by dietician.  I agree with the assessment and plan as outlined below: Nutrition Status: Nutrition  Problem: Moderate Malnutrition Etiology: chronic illness (PAD) Signs/Symptoms: mild fat depletion, moderate muscle depletion Interventions: Ensure Enlive (each supplement provides 350kcal and 20 grams of protein), Tube feeding, Liberalize Diet, Prostat, Calorie Count  .  Skin Assessment: I have examined the patient's skin and I agree with the wound assessment as performed by the wound care RN as outlined below:    Consultants:  GI  Procedures:  None  Antimicrobials:  Anti-infectives (From admission, onward)    None          Subjective:  Seen and examined.  No complaints.  No abdominal pain.  Still not eating.  Objective: Vitals:   11/16/21 0500 11/16/21 0554 11/16/21 0817 11/16/21 1011  BP:  (!) 117/55 123/78 119/65  Pulse:  (!) 109 (!) 116 (!) 118  Resp:   16 17  Temp:   99.4 F (37.4 C) 99.5 F (37.5 C)  TempSrc:   Oral Oral  SpO2:   (!) 85% (!) 88%  Weight: 55 kg     Height:       No intake or output data in the 24 hours ending 11/16/21 1218  Filed Weights   11/09/21 0500 11/15/21 0419 11/16/21 0500  Weight: 59 kg 55.1 kg 55 kg    Examination:  General exam: Appears calm and comfortable, very cachectic Respiratory system: Clear to auscultation. Respiratory effort normal. Cardiovascular system: S1 & S2 heard, RRR. No JVD, murmurs, rubs, gallops or clicks. No pedal edema. Gastrointestinal system: Abdomen is nondistended, soft and nontender. No organomegaly or masses felt. Normal bowel sounds heard. Central nervous system: Alert and oriented. No focal neurological deficits. Extremities: Symmetric 5 x 5 power. Skin: No rashes, lesions or ulcers.  Psychiatry: Judgement and insight appear normal. Mood & affect appropriate.   Data Reviewed: I have personally reviewed following labs and imaging studies  CBC: Recent Labs  Lab 11/10/21 1056 11/16/21 0821  WBC 9.5 10.3  NEUTROABS 7.6 7.7  HGB 10.8* 8.7*  HCT 34.2* 27.9*  MCV 84.7 84.3  PLT 150 222     Basic Metabolic Panel: Recent Labs  Lab 11/10/21 1056 11/11/21 0135 11/12/21 0158 11/16/21 0821  NA 136 133* 137 137  K 4.0 3.9 4.4 5.3*  CL 107 107 107 107  CO2 24 19* 23 22  GLUCOSE 112* 108* 115* 142*  BUN 27* 24* 21 29*  CREATININE 1.29* 1.14* 1.21* 1.18*  CALCIUM 8.6* 8.3* 8.5* 8.8*  MG 2.1  --   --  2.1    GFR: Estimated Creatinine Clearance: 27.3 mL/min (A) (by C-G formula based on SCr of 1.18 mg/dL (H)). Liver Function Tests: Recent Labs  Lab 11/10/21 1056  AST 14*  ALT 8  ALKPHOS 59  BILITOT 0.3  PROT 5.8*  ALBUMIN 2.2*    No results for input(s): LIPASE, AMYLASE in the last 168 hours. No results for input(s): AMMONIA in the last 168 hours. Coagulation Profile: No results for input(s): INR, PROTIME in the last 168 hours. Cardiac Enzymes: No results for input(s): CKTOTAL, CKMB, CKMBINDEX, TROPONINI in the last 168 hours. BNP (last 3 results) No results for input(s): PROBNP in the last 8760 hours. HbA1C: No results for input(s): HGBA1C in the last 72 hours. CBG: Recent Labs  Lab 11/15/21 2059 11/15/21 2332 11/16/21  7672 11/16/21 0820 11/16/21 1136  GLUCAP 94 119* 147* 144* 133*    Lipid Profile: No results for input(s): CHOL, HDL, LDLCALC, TRIG, CHOLHDL, LDLDIRECT in the last 72 hours. Thyroid Function Tests: No results for input(s): TSH, T4TOTAL, FREET4, T3FREE, THYROIDAB in the last 72 hours. Anemia Panel: No results for input(s): VITAMINB12, FOLATE, FERRITIN, TIBC, IRON, RETICCTPCT in the last 72 hours. Sepsis Labs: Recent Labs  Lab 11/16/21 0947  LATICACIDVEN 1.4    No results found for this or any previous visit (from the past 240 hour(s)).    Radiology Studies: No results found.  Scheduled Meds:  (feeding supplement) PROSource Plus  30 mL Oral BID BM   aspirin EC  81 mg Oral Daily   atorvastatin  40 mg Per NG tube Daily   dicyclomine  10 mg Per NG tube BID   enoxaparin (LOVENOX) injection  30 mg Subcutaneous Q24H    feeding supplement  237 mL Oral BID BM   feeding supplement (PROSource TF)  45 mL Per Tube Daily   feeding supplement (VITAL 1.5 CAL)  840 mL Per Tube Q24H   gabapentin  300 mg Per Tube QHS   lipase/protease/amylase  36,000 Units Oral TID AC   mirtazapine  15 mg Per NG tube QHS   multivitamin with minerals  1 tablet Per Tube Daily   nicotine  14 mg Transdermal Q24H   nystatin  5 mL Mouth/Throat QID   ondansetron  4 mg Oral Q6H   Or   ondansetron (ZOFRAN) IV  4 mg Intravenous Q6H   pantoprazole sodium  40 mg Per Tube Daily   senna-docusate  1 tablet Per NG tube BID   sucralfate  1 g Per NG tube TID WC & HS   verapamil  240 mg Oral Daily   Continuous Infusions:  sodium chloride 75 mL/hr at 11/15/21 1354     LOS: 15 days   Time spent: 30 minutes   Darliss Cheney, MD [facility]  11/16/2021, 12:18 PM  Please page via Shea [facility] and do not message via secure chat for anything urgent. Secure chat can be used for anything non urgent.  How to contact the [facility] Attending or Consulting provider [place] or covering provider during after hours [place], for this patient?  Check the care team in [facility] and look for a) attending/consulting TRH provider listed and b) the [facility] team listed. Page or secure chat [phone]. Log into [email or web address] and use [place]'s universal password to access. If you do not have the password, please contact the hospital operator. Locate the [facility] provider you are looking for under [facility] and page to a number that you can be directly reached. If you still have difficulty reaching the provider, please page the [facility] (Director on Call) for the Hospitalists listed on amion for assistance.

## 2021-11-16 NOTE — Plan of Care (Signed)
  Problem: Education: Goal: Knowledge of General Education information will improve Description: Including pain rating scale, medication(s)/side effects and non-pharmacologic comfort measures Outcome: Progressing   Problem: Pain Managment: Goal: General experience of comfort will improve Outcome: Progressing   Problem: Safety: Goal: Ability to remain free from injury will improve Outcome: Progressing   Problem: Skin Integrity: Goal: Risk for impaired skin integrity will decrease Outcome: Progressing   Problem: Activity: Goal: Risk for activity intolerance will decrease Outcome: Not Progressing   Problem: Nutrition: Goal: Adequate nutrition will be maintained Outcome: Not Progressing

## 2021-11-16 NOTE — Care Plan (Signed)
Pt placed on [facility] for sat of 88%.  95% after placing on [facility]

## 2021-11-16 NOTE — Care Plan (Signed)
Tylenol 650mg  given per peg for temp of 101 . Will monitor

## 2021-11-16 NOTE — Care Plan (Signed)
Dr Derl Barrow notified  of pts low o2 sat of 85% while he was in the room. Advised to encourage the use of IS.  Slight increase in Sats to 88%

## 2021-11-16 NOTE — Progress Notes (Signed)
Patient heart rate has been elevated throughout the night. Please see flowsheet. Patient stable. NP made aware. New orders made.

## 2021-11-16 NOTE — Plan of Care (Signed)

## 2021-11-17 LAB — MAGNESIUM: Magnesium: 2.2 mg/dL (ref 1.7–2.4)

## 2021-11-17 LAB — BASIC METABOLIC PANEL
Anion gap: 7 (ref 5–15)
BUN: 28 mg/dL — ABNORMAL HIGH (ref 8–23)
CO2: 23 mmol/L (ref 22–32)
Calcium: 8.8 mg/dL — ABNORMAL LOW (ref 8.9–10.3)
Chloride: 109 mmol/L (ref 98–111)
Creatinine, Ser: 1.17 mg/dL — ABNORMAL HIGH (ref 0.44–1.00)
GFR, Estimated: 46 mL/min — ABNORMAL LOW (ref >60)
Glucose, Bld: 149 mg/dL — ABNORMAL HIGH (ref 70–99)
Potassium: 4.8 mmol/L (ref 3.5–5.1)
Sodium: 139 mmol/L (ref 135–145)

## 2021-11-17 LAB — CBC WITH DIFFERENTIAL/PLATELET
Abs Immature Granulocytes: 0.06 10*3/uL (ref 0.00–0.07)
Basophils Absolute: 0 10*3/uL (ref 0.0–0.1)
Basophils Relative: 0 %
Eosinophils Absolute: 0.2 10*3/uL (ref 0.0–0.5)
Eosinophils Relative: 1 %
HCT: 29.5 % — ABNORMAL LOW (ref 36.0–46.0)
Hemoglobin: 8.8 g/dL — ABNORMAL LOW (ref 12.0–15.0)
Immature Granulocytes: 0 %
Lymphocytes Relative: 13 %
Lymphs Abs: 1.7 10*3/uL (ref 0.7–4.0)
MCH: 26 pg (ref 26.0–34.0)
MCHC: 29.8 g/dL — ABNORMAL LOW (ref 30.0–36.0)
MCV: 87.3 fL (ref 80.0–100.0)
Monocytes Absolute: 1.2 10*3/uL — ABNORMAL HIGH (ref 0.1–1.0)
Monocytes Relative: 9 %
Neutro Abs: 10.3 10*3/uL — ABNORMAL HIGH (ref 1.7–7.7)
Neutrophils Relative %: 77 %
Platelets: 258 10*3/uL (ref 150–400)
RBC: 3.38 MIL/uL — ABNORMAL LOW (ref 3.87–5.11)
RDW: 15.6 % — ABNORMAL HIGH (ref 11.5–15.5)
WBC: 13.4 10*3/uL — ABNORMAL HIGH (ref 4.0–10.5)
nRBC: 0 % (ref 0.0–0.2)

## 2021-11-17 LAB — GLUCOSE, CAPILLARY
Glucose-Capillary: 118 mg/dL — ABNORMAL HIGH (ref 70–99)
Glucose-Capillary: 128 mg/dL — ABNORMAL HIGH (ref 70–99)
Glucose-Capillary: 131 mg/dL — ABNORMAL HIGH (ref 70–99)
Glucose-Capillary: 133 mg/dL — ABNORMAL HIGH (ref 70–99)
Glucose-Capillary: 138 mg/dL — ABNORMAL HIGH (ref 70–99)
Glucose-Capillary: 196 mg/dL — ABNORMAL HIGH (ref 70–99)

## 2021-11-17 LAB — PROCALCITONIN: Procalcitonin: 0.17 ng/mL

## 2021-11-17 MED ORDER — SODIUM CHLORIDE 0.9 % IV SOLN
3.0000 g | Freq: Two times a day (BID) | INTRAVENOUS | Status: DC
  Administered 2021-11-17 – 2021-11-21 (×9): 3 g via INTRAVENOUS
  Filled 2021-11-17 (×11): qty 8

## 2021-11-17 MED ORDER — ASPIRIN 81 MG PO CHEW
81.0000 mg | CHEWABLE_TABLET | Freq: Every day | ORAL | Status: DC
  Administered 2021-11-18: 81 mg
  Filled 2021-11-17: qty 1

## 2021-11-17 MED ORDER — VERAPAMIL HCL 80 MG PO TABS
80.0000 mg | ORAL_TABLET | Freq: Three times a day (TID) | ORAL | Status: DC
  Administered 2021-11-18 – 2021-11-19 (×5): 80 mg
  Filled 2021-11-17 (×6): qty 1

## 2021-11-17 MED ORDER — MAGIC MOUTHWASH
10.0000 mL | Freq: Four times a day (QID) | ORAL | Status: DC | PRN
  Administered 2021-11-17 – 2021-11-18 (×2): 10 mL via ORAL
  Filled 2021-11-17 (×3): qty 10

## 2021-11-17 NOTE — Progress Notes (Signed)
PROGRESS NOTE    Linda [name]  EHU:[id number] DOB: 1936-11-02 DOA: 10/31/2021 PCP: Sonia Side., FNP   Brief Narrative:  85 year old female with remote history of breast cancer, hypertension, dyslipidemia, peripheral vascular disease, questionable left renal mass came to ED with complaints of lower abdominal pain and discomfort for 4 to 5 weeks.  Worse after eating, poor p.o. intake and ongoing weight loss.  In the ED she was found to have elevated lipase 378, creatinine 1.7 CT abdomen showed left upper pole renal mass larger than 2020 and a cystic lesion in the pancreatic tail.  Gastroenterology was consulted, CT abdomen was being considered to rule out mesenteric ischemia which was deferred due to acute kidney injury.  Endoscopy on 11/30 was unrevealing   Did not tolerate MRI, even after 3 attempts.  Assessment & Plan:   Principal Problem:   Abdominal pain Active Problems:   Malignant neoplasm of female breast ([place])   Essential hypertension   Tobacco abuse   AKI (acute kidney injury) ([place])   HLD (hyperlipidemia)   Renal mass   Malnutrition of moderate degree   Decreased oral intake   Gastritis and gastroduodenitis  Abdominal pain/acute on chronic pancreatitis/chronic moderate to severe protein calorie malnutrition/weight loss: Initially, potential cause of her acute on chronic abdominal pain was presumed to be vasculopathy/mesenteric ischemia however patient underwent CT angiogram of the abdomen however no celiac axis or SMA stenosis seen on CT angiogram. That study does demonstrate significant changes indicating chronic pancreatitis with pancreatic duct distortion, cause of this unknown.  There is a cystic abnormality 1.6 cm low-attenuation, radiology suspects sequelae of chronic pancreatitis or perhaps developing pseudocyst and cannot exclude neoplasm. Patient's malnutrition currently being treated with core track tube feeds, though the long-term nutrition plan is unclear since  the patient has persistent sitophobia.  Pain has resolved since last few days now. She will need to continue pancreatic enzyme supplements, encouragement to slowly try a low-fat diet (with the hope of controlling but perhaps not eliminating pain with judicious use of pain meds), and ultimately weaning her from tube feeds.  GI has seen her and they are going to arrange outpatient endoscopic ultrasound to evaluate possible pancreatic neoplasm.  Is a still not eating much.  I had a lengthy discussion with the daughter yesterday, daughter wants to take her home without any kind of feeding tube and she will try to encourage her and she believes that patient will eat more and do better.  Sepsis And acute hypoxic respiratory failure secondary to aspiration pneumonia: Patient has developed fever with a T-max of 101.4 over last 24 hours with tachycardia and chest x-ray also suggests possible aspiration pneumonia.  Also has leukocytosis.  We will start the patient on Unasyn.  Vitals at this point in time are controlled.  We will obtain blood culture.  He is currently on 2 L oxygen.  Encouraged incentive spirometry.  Left renal mass: Larger as compared to the imaging in 2020.  Concerning for renal Carcinoma however ultrasound renal rule out mass but did show some bilateral renal cysts.  AKI: Creatinine has remained stable around 1.2 since last several days, wonder if this is going to be her new baseline, if so, she will fit into criteria of CKD stage IIIa.  Essential hypertension: Very well controlled continue verapamil.  Hold Cozaar and hydralazine.     DVT prophylaxis: enoxaparin (LOVENOX) injection 30 mg Start: 11/06/21 1700   Code Status: DNR  Family Communication:  None present  at bedside.  Had a lengthy discussion with the daughter.  She is of the opinion that patient will do better with eating at home and she also wants the patient to come home either tomorrow or day after.  Status is:  Inpatient  Remains inpatient appropriate because: Inadequate p.o. intake.  Estimated body mass index is 22.45 kg/m as calculated from the following:   Height as of this encounter: 5\' 1"  (1.549 m).   Weight as of this encounter: 53.9 kg.     Nutritional Assessment: Body mass index is 22.45 kg/m.Marland Kitchen Seen by dietician.  I agree with the assessment and plan as outlined below: Nutrition Status: Nutrition Problem: Moderate Malnutrition Etiology: chronic illness (PAD) Signs/Symptoms: mild fat depletion, moderate muscle depletion Interventions: Ensure Enlive (each supplement provides 350kcal and 20 grams of protein), Tube feeding, Liberalize Diet, Prostat, Calorie Count  Skin Assessment: I have examined the patient's skin and I agree with the wound assessment as performed by the wound care RN as outlined below:    Consultants:  GI  Procedures:  None  Antimicrobials:  Anti-infectives (From admission, onward)    Start     Dose/Rate Route Frequency Ordered Stop   11/17/21 1200  Ampicillin-Sulbactam (UNASYN) 3 g in sodium chloride 0.9 % 100 mL IVPB        3 g 200 mL/hr over 30 Minutes Intravenous Every 12 hours 11/17/21 1123            Subjective:  Seen and examined.  She complains of some shortness of breath.  Still not eating much.  Objective: Vitals:   11/17/21 0438 11/17/21 0841 11/17/21 1023 11/17/21 1105  BP: (!) 121/57 (!) 156/73 (!) 75/59 (!) 143/86  Pulse: (!) 117 (!) 112 84 63  Resp: 16 18 17    Temp: 99.8 F (37.7 C) 98.1 F (36.7 C) 98.2 F (36.8 C)   TempSrc:  Oral Oral   SpO2:  100% 92%   Weight:      Height:        Intake/Output Summary (Last 24 hours) at 11/17/2021 1324 Last data filed at 11/17/2021 0300 Gross per 24 hour  Intake 3341.85 ml  Output --  Net 6440.85 ml    Filed Weights   11/15/21 0419 11/16/21 0500 11/17/21 0410  Weight: 55.1 kg 55 kg 53.9 kg    Examination:  General exam: Appears slightly tachypneic and very  cachectic. Respiratory system: Diminished breath sounds with rhonchi at the bases bilaterally, poor inspiratory effort. Cardiovascular system: S1 & S2 heard, RRR. No JVD, murmurs, rubs, gallops or clicks. No pedal edema. Gastrointestinal system: Abdomen is nondistended, soft and nontender. No organomegaly or masses felt. Normal bowel sounds heard. Central nervous system: Alert and oriented. No focal neurological deficits. Extremities: Symmetric 5 x 5 power. Skin: No rashes, lesions or ulcers.  Psychiatry: Judgement and insight appear normal. Mood & affect appropriate.   Data Reviewed: I have personally reviewed following labs and imaging studies  CBC: Recent Labs  Lab 11/16/21 0821 11/17/21 0908  WBC 10.3 13.4*  NEUTROABS 7.7 10.3*  HGB 8.7* 8.8*  HCT 27.9* 29.5*  MCV 84.3 87.3  PLT 222 347    Basic Metabolic Panel: Recent Labs  Lab 11/11/21 0135 11/12/21 0158 11/16/21 0821 11/17/21 0908  NA 133* 137 137 139  K 3.9 4.4 5.3* 4.8  CL 107 107 107 109  CO2 19* 23 22 23   GLUCOSE 108* 115* 142* 149*  BUN 24* 21 29* 28*  CREATININE 1.14* 1.21* 1.18* 1.17*  CALCIUM 8.3* 8.5* 8.8* 8.8*  MG  --   --  2.1 2.2    GFR: Estimated Creatinine Clearance: 27.5 mL/min (A) (by C-G formula based on SCr of 1.17 mg/dL (H)). Liver Function Tests: No results for input(s): AST, ALT, ALKPHOS, BILITOT, PROT, ALBUMIN in the last 168 hours.  No results for input(s): LIPASE, AMYLASE in the last 168 hours. No results for input(s): AMMONIA in the last 168 hours. Coagulation Profile: No results for input(s): INR, PROTIME in the last 168 hours. Cardiac Enzymes: No results for input(s): CKTOTAL, CKMB, CKMBINDEX, TROPONINI in the last 168 hours. BNP (last 3 results) No results for input(s): PROBNP in the last 8760 hours. HbA1C: No results for input(s): HGBA1C in the last 72 hours. CBG: Recent Labs  Lab 11/16/21 2237 11/17/21 0013 11/17/21 0430 11/17/21 0824 11/17/21 1106  GLUCAP 97 118*  128* 131* 196*    Lipid Profile: No results for input(s): CHOL, HDL, LDLCALC, TRIG, CHOLHDL, LDLDIRECT in the last 72 hours. Thyroid Function Tests: No results for input(s): TSH, T4TOTAL, FREET4, T3FREE, THYROIDAB in the last 72 hours. Anemia Panel: No results for input(s): VITAMINB12, FOLATE, FERRITIN, TIBC, IRON, RETICCTPCT in the last 72 hours. Sepsis Labs: Recent Labs  Lab 11/16/21 0821 11/16/21 1059 11/17/21 0908  PROCALCITON  --   --  0.17  LATICACIDVEN 1.4 1.0  --      No results found for this or any previous visit (from the past 240 hour(s)).    Radiology Studies: DG CHEST PORT 1 VIEW  Result Date: 11/16/2021 CLINICAL DATA:  Sudden onset shortness of breath EXAM: PORTABLE CHEST 1 VIEW COMPARISON:  06/19/2019 FINDINGS: Single frontal view of the chest demonstrates enteric catheter passing below diaphragm tip excluded by collimation. The cardiac silhouette is stable. There is chronic interstitial scarring, with trace left pleural effusion and patchy left basilar consolidation. No pneumothorax. No acute bony abnormalities. IMPRESSION: 1. Patchy left basilar consolidation and trace left pleural effusion, which could reflect pneumonia or aspiration. Electronically Signed   By: Randa Ngo M.D.   On: 11/16/2021 15:23    Scheduled Meds:  (feeding supplement) PROSource Plus  30 mL Oral BID BM   [START ON 11/18/2021] aspirin  81 mg Per Tube Daily   atorvastatin  40 mg Per NG tube Daily   dicyclomine  10 mg Per NG tube BID   enoxaparin (LOVENOX) injection  30 mg Subcutaneous Q24H   feeding supplement  237 mL Oral BID BM   feeding supplement (PROSource TF)  45 mL Per Tube Daily   feeding supplement (VITAL 1.5 CAL)  840 mL Per Tube Q24H   gabapentin  300 mg Per Tube QHS   lipase/protease/amylase  36,000 Units Oral TID AC   mirtazapine  15 mg Per NG tube QHS   multivitamin with minerals  1 tablet Per Tube Daily   nicotine  14 mg Transdermal Q24H   nystatin  5 mL Mouth/Throat  QID   ondansetron  4 mg Oral Q6H   Or   ondansetron (ZOFRAN) IV  4 mg Intravenous Q6H   pantoprazole sodium  40 mg Per Tube Daily   senna-docusate  1 tablet Per NG tube BID   sucralfate  1 g Per NG tube TID WC & HS   [START ON 11/18/2021] verapamil  80 mg Per Tube Q8H   Continuous Infusions:  sodium chloride 75 mL/hr at 11/16/21 1920   ampicillin-sulbactam (UNASYN) IV 3 g (11/17/21 1301)     LOS: 16 days  Time spent: 31 minutes   Darliss Cheney, MD [facility]  11/17/2021, 1:24 PM  Please page via [place] and do not message via secure chat for anything urgent. Secure chat can be used for anything non urgent.  How to contact the [facility] Attending or Consulting provider [place] or covering provider during after hours [place], for this patient?  Check the care team in [facility] and look for a) attending/consulting [facility] provider listed and b) the [facility] team listed. Page or secure chat [phone]. Log into [email or web address] and use [facility]'s universal password to access. If you do not have the password, please contact the hospital operator. Locate the [facility] provider you are looking for under [facility] and page to a number that you can be directly reached. If you still have difficulty reaching the provider, please page the [facility] (Director on Call) for the Hospitalists listed on amion for assistance.

## 2021-11-17 NOTE — Progress Notes (Signed)
Pharmacy Antibiotic Note  Linda [name] is a 85 y.o. female to begin Unasyn for aspiration pneumonia coverage. Temp to 101.4, WBC up 13.4.    Plan: Unasyn 3gm IV q12h. Will follow renal function for any need to adjust dosing interval. Follow progress, antibiotic plans.  Height: 5\' 1"  (154.9 cm) Weight: 53.9 kg (118 lb 13.3 oz) IBW/kg (Calculated) : 47.8  Temp (24hrs), Avg:99.5 F (37.5 C), Min:98.1 F (36.7 C), Max:101.4 F (38.6 C)  Recent Labs  Lab 11/11/21 0135 11/12/21 0158 11/16/21 0821 11/16/21 1059 11/17/21 0908  WBC  --   --  10.3  --  13.4*  CREATININE 1.14* 1.21* 1.18*  --  1.17*  LATICACIDVEN  --   --  1.4 1.0  --     Estimated Creatinine Clearance: 27.5 mL/min (A) (by C-G formula based on SCr of 1.17 mg/dL (H)).    No Known Allergies  Antimicrobials this admission:  Unasyn 12/12>>  Dose adjustments this admission:  n/a  Microbiology results: 11/25 COVID and flu: negative  Thank you for allowing pharmacy to be a part of this patient's care.  Arty Baumgartner, [place] 11/17/2021 12:15 PM

## 2021-11-17 NOTE — Progress Notes (Signed)
Calorie Count Note  48-72 hour calorie count ordered.  Diet: regular Supplements: Ensure Enlive BID, 32mls Prosource Plus BID  No meal tickets were kept over first day of calorie count. Discussed with pt who reports she was not able to eat anything at all yesterday outside of drinking ~1/2 of her Ensure. Reviewed options for optimizing PO intake with pt and encouraged her to eat more PO.   RD returned on Monday, 12/12 to review remaining results... only 2 meal tickets in envelope -- one from breakfast on 12/10 in which pt ate only bites of oatmeal (negligible) and another from dinner on 12/10 in which pt had 1/2 cream of potato soup and 1/2 low fat cottage cheese, which is still inadequate to meet needs.   No documentation provided regarding other meals nor supplement completion, though appears pt is accepting supplements per RN. Pt unavailable at time of RD visit to provide more information regarding intake of items provided by her daughter, but it is unlikely that pt consumed enough to meet her nutritional needs.   NUTRITION DIAGNOSIS:  Moderate Malnutrition related to chronic illness (PAD) as evidenced by mild fat depletion, moderate muscle depletion.-- ongoing   GOAL:  Patient will meet greater than or equal to 90% of their needs --addressing with TF and supplements  INTERVENTION:  -d/c calorie count, but RN should continue to document meal completions in EPIC -Continue Ensure Enlive po BID, each supplement provides 350 kcal and 20 grams of protein -Continue PROSource PLUS PO 71mls BID, each supplement provides 100 kcals and 15 grams of protein -Continue assistance with room service -Continue nocturnal TF via Cortrak: Vital 1.5 @ 23ml/hr (871ml) 33ml Prosource TF daily   Nocturnal TF provides 1300 kcals, 67 grams protein, 613ml free water (meets ~79% minimum estimated calorie and protein needs  Recommend consideration of PEG to provide long-term nutrition support given pt's ongoing  inadequate PO intake.    Theone Stanley., MS, RD, LDN (she/her/hers) RD pager number and weekend/on-call pager number located in [place].

## 2021-11-17 NOTE — Progress Notes (Signed)
Mobility Specialist Progress Note   11/17/21 1654  Mobility  Activity Dangled on edge of bed  Level of Assistance Standby assist, set-up cues, supervision of patient - no hands on  Assistive Device None  Mobility Sit up in bed/chair position for meals  Mobility Response Tolerated well  Mobility performed by Mobility specialist  $Mobility charge 1 Mobility   Pt presented today very fatigued from multiple episodes of diarrhea. Only able to get EOB and do LE exercises before pt wanted to quit. Returned back to bed w/ call bell by side and all needs met.    Holland Falling Mobility Specialist Phone Number [phone]

## 2021-11-18 ENCOUNTER — Inpatient Hospital Stay (HOSPITAL_COMMUNITY): Payer: Medicare Other

## 2021-11-18 LAB — GLUCOSE, CAPILLARY
Glucose-Capillary: 125 mg/dL — ABNORMAL HIGH (ref 70–99)
Glucose-Capillary: 125 mg/dL — ABNORMAL HIGH (ref 70–99)
Glucose-Capillary: 136 mg/dL — ABNORMAL HIGH (ref 70–99)
Glucose-Capillary: 79 mg/dL (ref 70–99)
Glucose-Capillary: 86 mg/dL (ref 70–99)
Glucose-Capillary: 95 mg/dL (ref 70–99)

## 2021-11-18 LAB — BASIC METABOLIC PANEL
Anion gap: 6 (ref 5–15)
BUN: 30 mg/dL — ABNORMAL HIGH (ref 8–23)
CO2: 23 mmol/L (ref 22–32)
Calcium: 8.6 mg/dL — ABNORMAL LOW (ref 8.9–10.3)
Chloride: 113 mmol/L — ABNORMAL HIGH (ref 98–111)
Creatinine, Ser: 1.1 mg/dL — ABNORMAL HIGH (ref 0.44–1.00)
GFR, Estimated: 50 mL/min — ABNORMAL LOW (ref >60)
Glucose, Bld: 144 mg/dL — ABNORMAL HIGH (ref 70–99)
Potassium: 4.8 mmol/L (ref 3.5–5.1)
Sodium: 142 mmol/L (ref 135–145)

## 2021-11-18 LAB — CBC WITH DIFFERENTIAL/PLATELET
Abs Immature Granulocytes: 0.07 10*3/uL (ref 0.00–0.07)
Basophils Absolute: 0 10*3/uL (ref 0.0–0.1)
Basophils Relative: 0 %
Eosinophils Absolute: 0.1 10*3/uL (ref 0.0–0.5)
Eosinophils Relative: 1 %
HCT: 26.9 % — ABNORMAL LOW (ref 36.0–46.0)
Hemoglobin: 8.4 g/dL — ABNORMAL LOW (ref 12.0–15.0)
Immature Granulocytes: 1 %
Lymphocytes Relative: 9 %
Lymphs Abs: 1.1 10*3/uL (ref 0.7–4.0)
MCH: 26.4 pg (ref 26.0–34.0)
MCHC: 31.2 g/dL (ref 30.0–36.0)
MCV: 84.6 fL (ref 80.0–100.0)
Monocytes Absolute: 0.9 10*3/uL (ref 0.1–1.0)
Monocytes Relative: 7 %
Neutro Abs: 10.6 10*3/uL — ABNORMAL HIGH (ref 1.7–7.7)
Neutrophils Relative %: 82 %
Platelets: 262 10*3/uL (ref 150–400)
RBC: 3.18 MIL/uL — ABNORMAL LOW (ref 3.87–5.11)
RDW: 15.9 % — ABNORMAL HIGH (ref 11.5–15.5)
WBC: 12.8 10*3/uL — ABNORMAL HIGH (ref 4.0–10.5)
nRBC: 0 % (ref 0.0–0.2)

## 2021-11-18 LAB — MAGNESIUM: Magnesium: 2.1 mg/dL (ref 1.7–2.4)

## 2021-11-18 MED ORDER — GUAIFENESIN 100 MG/5ML PO LIQD
5.0000 mL | ORAL | Status: DC | PRN
  Filled 2021-11-18 (×2): qty 5

## 2021-11-18 MED ORDER — FUROSEMIDE 10 MG/ML IJ SOLN
40.0000 mg | Freq: Once | INTRAMUSCULAR | Status: AC
  Administered 2021-11-18: 40 mg via INTRAVENOUS
  Filled 2021-11-18: qty 4

## 2021-11-18 NOTE — Progress Notes (Signed)
Pharmacy Antibiotic Note  Linda [name] is a 85 y.o. female admitted on 10/31/2021 with  aspiration PNA .  Pharmacy has been consulted for Unasyn dosing.  ID: Unasyn for asp PNA  Temp to 101.4, WBC up 12.8, Scr 1.1 (CrCl <30)   Unasyn 12/12>>  11/25 COVID and flu: neg 12/12: BC x 2>>   Plan: Unasyn 3gm IV q12h for asp PNA and crcl ~<30 Pharmacy will sign off. Please reconsult for further dosing assitance.   Height: 5\' 1"  (154.9 cm) Weight: 53.9 kg (118 lb 13.3 oz) IBW/kg (Calculated) : 47.8  Temp (24hrs), Avg:99.3 F (37.4 C), Min:98.6 F (37 C), Max:99.9 F (37.7 C)  Recent Labs  Lab 11/12/21 0158 11/16/21 0821 11/16/21 1059 11/17/21 0908 11/18/21 1137  WBC  --  10.3  --  13.4* 12.8*  CREATININE 1.21* 1.18*  --  1.17* 1.10*  LATICACIDVEN  --  1.4 1.0  --   --     Estimated Creatinine Clearance: 29.2 mL/min (A) (by C-G formula based on SCr of 1.1 mg/dL (H)).    No Known Allergies  Saide Lanuza S. Alford Highland, PharmD, BCPS Clinical Staff Pharmacist [email or web address]   Wayland [place] 11/18/2021 2:15 PM

## 2021-11-18 NOTE — Plan of Care (Signed)
°  Problem: Education: Goal: Knowledge of General Education information will improve Description: Including pain rating scale, medication(s)/side effects and non-pharmacologic comfort measures Outcome: Progressing   Problem: Health Behavior/Discharge Planning: Goal: Ability to manage health-related needs will improve Outcome: Not Progressing   Problem: Clinical Measurements: Goal: Ability to maintain clinical measurements within normal limits will improve Outcome: Not Progressing Goal: Will remain free from infection Outcome: Progressing Goal: Diagnostic test results will improve Outcome: Not Progressing Goal: Respiratory complications will improve Outcome: Not Progressing Goal: Cardiovascular complication will be avoided Outcome: Not Progressing   Problem: Activity: Goal: Risk for activity intolerance will decrease Outcome: Not Progressing   Problem: Nutrition: Goal: Adequate nutrition will be maintained Outcome: Not Progressing   Problem: Coping: Goal: Level of anxiety will decrease Outcome: Progressing   Problem: Elimination: Goal: Will not experience complications related to bowel motility Outcome: Not Progressing Goal: Will not experience complications related to urinary retention Outcome: Progressing   Problem: Pain Managment: Goal: General experience of comfort will improve Outcome: Progressing   Problem: Safety: Goal: Ability to remain free from injury will improve Outcome: Progressing   Problem: Skin Integrity: Goal: Risk for impaired skin integrity will decrease Outcome: Not Progressing

## 2021-11-18 NOTE — Progress Notes (Signed)
Physical Therapy Treatment Patient Details Name: Linda [name] MRN: [id number] DOB: [birth year] Today's Date: 11/18/2021   History of Present Illness Pt is an 85 y.o. female admitted 11/25 for abdominal pain. Found to have acute on chronic pancreatitis, sepsis and aspiration PNA. PMH: remote breast cancer; HTN; HLD, and PVD    PT Comments    Patient reports having a rough day yesterday with diarrhea. Reports feeling frustrated about not being able to swallow and not feeling well today. Agreeable to OOB transfer to chair this morning requiring Min guard-Min A for mobility. Able to take a few steps but self limiting due to 3/4 DOE and fatigue/weakness. Sp02 remained >91% on 3L/min 02 [place] during session. Will continue to follow and progress as able.   Recommendations for follow up therapy are one component of a multi-disciplinary discharge planning process, led by the attending physician.  Recommendations may be updated based on patient status, additional functional criteria and insurance authorization.  Follow Up Recommendations  Skilled nursing-short term rehab (<3 hours/day)     Assistance Recommended at Discharge Frequent or constant Supervision/Assistance  Equipment Recommendations  Rolling walker (2 wheels)    Recommendations for Other Services       Precautions / Restrictions Precautions Precautions: Other (comment);Fall Precaution Comments: cortrak Restrictions Weight Bearing Restrictions: No     Mobility  Bed Mobility Overal bed mobility: Needs Assistance Bed Mobility: Supine to Sit     Supine to sit: Min assist;HOB elevated     General bed mobility comments: +rail, increased time and assist with trunk.    Transfers Overall transfer level: Needs assistance Equipment used: Rolling walker (2 wheels) Transfers: Sit to/from Stand;Bed to chair/wheelchair/BSC Sit to Stand: Min assist     Step pivot transfers: Min guard     General transfer comment: assist to  power up, cues for hand placement, increased time. Transferred to chair. Able to takea few steps to get to chair and turn around to untangle lines. flexed trunk. 3/4 DOE. Sp02 remained >91% on 3L/min 02 [place].    Ambulation/Gait               General Gait Details: Declined further ambulation today.   Stairs             Wheelchair Mobility    Modified Rankin (Stroke Patients Only)       Balance Overall balance assessment: Needs assistance Sitting-balance support: Feet supported;No upper extremity supported Sitting balance-Leahy Scale: Fair Sitting balance - Comments: leaning on legs forward as position of comfort   Standing balance support: During functional activity;Reliant on assistive device for balance Standing balance-Leahy Scale: Poor Standing balance comment: reliant on external support                            Cognition Arousal/Alertness: Awake/alert Behavior During Therapy: WFL for tasks assessed/performed;Flat affect Overall Cognitive Status: Within Functional Limits for tasks assessed                                 General Comments: Seems frustrated and sad about having NG tube in and not being able to swallow.        Exercises      General Comments General comments (skin integrity, edema, etc.): Sp02 remained >91% on 3L/min 02  with activity.      Pertinent Vitals/Pain Pain Assessment: Faces Faces Pain Scale: Hurts a little  bit Pain Location: throat Pain Descriptors / Indicators: Sore Pain Intervention(s): Monitored during session    Home Living                          Prior Function            PT Goals (current goals can now be found in the care plan section) Progress towards PT goals: Not progressing toward goals - comment (fatigue)    Frequency    Min 2X/week      PT Plan Current plan remains appropriate    Co-evaluation              AM-PAC PT "6 Clicks" Mobility   Outcome  Measure  Help needed turning from your back to your side while in a flat bed without using bedrails?: A Little Help needed moving from lying on your back to sitting on the side of a flat bed without using bedrails?: A Little Help needed moving to and from a bed to a chair (including a wheelchair)?: A Little Help needed standing up from a chair using your arms (e.g., wheelchair or bedside chair)?: A Little Help needed to walk in hospital room?: A Little Help needed climbing 3-5 steps with a railing? : A Lot 6 Click Score: 17    End of Session Equipment Utilized During Treatment: Gait belt Activity Tolerance: Patient limited by fatigue Patient left: in chair;with call bell/phone within reach;with chair alarm set Nurse Communication: Mobility status PT Visit Diagnosis: Muscle weakness (generalized) (M62.81);Difficulty in walking, not elsewhere classified (R26.2)     Time: 5093-2671 PT Time Calculation (min) (ACUTE ONLY): 24 min  Charges:  $Therapeutic Activity: 23-37 mins                     Marisa Severin, PT, DPT Acute Rehabilitation Services Pager [phone] Office [place] 11/18/2021, 9:45 AM

## 2021-11-18 NOTE — Progress Notes (Signed)
°   11/18/21 2112  Assess: MEWS Score  BP (!) 149/67  Pulse Rate (!) 112  Resp 19  Level of Consciousness Alert  SpO2 99 %  Assess: MEWS Score  MEWS Temp 0  MEWS Systolic 0  MEWS Pulse 2  MEWS RR 0  MEWS LOC 0  MEWS Score 2  MEWS Score Color Yellow  Assess: if the MEWS score is Yellow or Red  Were vital signs taken at a resting state? Yes  Focused Assessment No change from prior assessment  Early Detection of Sepsis Score *See Row Information* Low  MEWS guidelines implemented *See Row Information* Yes  Treat  MEWS Interventions Administered scheduled meds/treatments;Administered prn meds/treatments  Pain Scale 0-10  Pain Score 0  Take Vital Signs  Increase Vital Sign Frequency  Yellow: Q 2hr X 2 then Q 4hr X 2, if remains yellow, continue Q 4hrs  Escalate  MEWS: Escalate Yellow: discuss with charge nurse/RN and consider discussing with provider and RRT  Notify: Charge Nurse/RN  Name of Charge Nurse/RN Notified Magdalyn Arenivas  Document  Patient Outcome Stabilized after interventions  Progress note created (see row info) Yes

## 2021-11-18 NOTE — Progress Notes (Signed)
PROGRESS NOTE    Linda [name]  WNU:[id number] DOB: 29-Oct-1936 DOA: 10/31/2021 PCP: Sonia Side., FNP   Brief Narrative:  85 year old female with remote history of breast cancer, hypertension, dyslipidemia, peripheral vascular disease, questionable left renal mass came to ED with complaints of lower abdominal pain and discomfort for 4 to 5 weeks.  Worse after eating, poor p.o. intake and ongoing weight loss.  In the ED she was found to have elevated lipase 378, creatinine 1.7 CT abdomen showed left upper pole renal mass larger than 2020 and a cystic lesion in the pancreatic tail.  Gastroenterology was consulted, CT abdomen was being considered to rule out mesenteric ischemia which was deferred due to acute kidney injury.  Endoscopy on 11/30 was unrevealing   Did not tolerate MRI, even after 3 attempts.  Finally she agreed for the PEG tube today, I have consulted IR.  Assessment & Plan:   Principal Problem:   Abdominal pain Active Problems:   Malignant neoplasm of female breast ([place])   Essential hypertension   Tobacco abuse   AKI (acute kidney injury) ([place])   HLD (hyperlipidemia)   Renal mass   Malnutrition of moderate degree   Decreased oral intake   Gastritis and gastroduodenitis  Abdominal pain/acute on chronic pancreatitis/chronic moderate to severe protein calorie malnutrition/weight loss: Initially, potential cause of her acute on chronic abdominal pain was presumed to be vasculopathy/mesenteric ischemia however patient underwent CT angiogram of the abdomen however no celiac axis or SMA stenosis seen on CT angiogram. That study does demonstrate significant changes indicating chronic pancreatitis with pancreatic duct distortion, cause of this unknown.  There is a cystic abnormality 1.6 cm low-attenuation, radiology suspects sequelae of chronic pancreatitis or perhaps developing pseudocyst and cannot exclude neoplasm. Patient's malnutrition currently being treated with core track  tube feeds, though the long-term nutrition plan is unclear since the patient has persistent sitophobia.  Pain has resolved since last few days now. She will need to continue pancreatic enzyme supplements, encouragement to slowly try a low-fat diet (with the hope of controlling but perhaps not eliminating pain with judicious use of pain meds), and ultimately weaning her from tube feeds.  GI has seen her and they are going to arrange outpatient endoscopic ultrasound to evaluate possible pancreatic neoplasm.  Is a still not eating much.  I discussed with the daughter couple days ago about the plan, she wanted her to come home without tube feedings and try to feed her, she believed that she will do better at home cooked meal.  She was against PEG tube.  I had in the discussion with the daughter today regarding PEG tube, she was under the impression that PEG tube is permanent, I informed her that this can be taken out once patient is able to take enough nutrition through p.o., after this, patient and daughter both agreeable.  I have consulted IR for PEG tube placement.  Sepsis And acute hypoxic respiratory failure secondary to aspiration pneumonia: Patient has developed fever with a T-max of 101.4 over last 24 hours with tachycardia and chest x-ray also suggests possible aspiration pneumonia.  Also has leukocytosis.  She is requiring 2 L of oxygen, blood culture negative.  Continue Zosyn.  Left renal mass: Larger as compared to the imaging in 2020.  Concerning for renal Carcinoma however ultrasound renal rule out mass but did show some bilateral renal cysts.  AKI: Creatinine has remained stable around 1.2 since last several days, wonder if this is going to  be her new baseline, if so, she will fit into criteria of CKD stage IIIa.  Essential hypertension: Very well controlled continue verapamil.  Hold Cozaar and hydralazine.     DVT prophylaxis: enoxaparin (LOVENOX) injection 30 mg Start: 11/06/21 1700   Code  Status: DNR  Family Communication:  None present at bedside.  Had a lengthy discussion with the daughter.   Status is: Inpatient  Remains inpatient appropriate because: Inadequate p.o. intake.  Estimated body mass index is 22.45 kg/m as calculated from the following:   Height as of this encounter: 5\' 1"  (1.549 m).   Weight as of this encounter: 53.9 kg.     Nutritional Assessment: Body mass index is 22.45 kg/m.Marland Kitchen Seen by dietician.  I agree with the assessment and plan as outlined below: Nutrition Status: Nutrition Problem: Moderate Malnutrition Etiology: chronic illness (PAD) Signs/Symptoms: mild fat depletion, moderate muscle depletion Interventions: Ensure Enlive (each supplement provides 350kcal and 20 grams of protein), Tube feeding, Liberalize Diet, Prostat, Calorie Count  Skin Assessment: I have examined the patient's skin and I agree with the wound assessment as performed by the wound care RN as outlined below:    Consultants:  GI-signed off IR  Procedures:  None  Antimicrobials:  Anti-infectives (From admission, onward)    Start     Dose/Rate Route Frequency Ordered Stop   11/17/21 1200  Ampicillin-Sulbactam (UNASYN) 3 g in sodium chloride 0.9 % 100 mL IVPB        3 g 200 mL/hr over 30 Minutes Intravenous Every 12 hours 11/17/21 1123            Subjective:  And examined.  Complains of some shortness of breath.  No abdominal pain.  No other complaint.  Objective: Vitals:   11/17/21 1105 11/17/21 2014 11/18/21 0525 11/18/21 0802  BP: (!) 143/86 (!) 126/59 (!) 149/68 (!) 148/60  Pulse: 63 (!) 101  90  Resp:  16  18  Temp:  99.9 F (37.7 C)  98.6 F (37 C)  TempSrc:  Oral  Oral  SpO2:  90%  90%  Weight:      Height:        Intake/Output Summary (Last 24 hours) at 11/18/2021 1410 Last data filed at 11/18/2021 1042 Gross per 24 hour  Intake 1172.4 ml  Output --  Net 1172.4 ml    Filed Weights   11/15/21 0419 11/16/21 0500 11/17/21 0410   Weight: 55.1 kg 55 kg 53.9 kg    Examination:  General exam: Appears calm and comfortable, chronically sick and very cachectic Respiratory system: Diminished breath sounds with possibly some rhonchi at the bases bilaterally. Respiratory effort normal. Cardiovascular system: S1 & S2 heard, RRR. No JVD, murmurs, rubs, gallops or clicks. No pedal edema. Gastrointestinal system: Abdomen is nondistended, soft and nontender. No organomegaly or masses felt. Normal bowel sounds heard. Central nervous system: Alert and oriented. No focal neurological deficits. Extremities: Symmetric 5 x 5 power. Skin: No rashes, lesions or ulcers.  Psychiatry: Judgement and insight appear normal. Mood & affect appropriate.   Data Reviewed: I have personally reviewed following labs and imaging studies  CBC: Recent Labs  Lab 11/16/21 0821 11/17/21 0908 11/18/21 1137  WBC 10.3 13.4* 12.8*  NEUTROABS 7.7 10.3* 10.6*  HGB 8.7* 8.8* 8.4*  HCT 27.9* 29.5* 26.9*  MCV 84.3 87.3 84.6  PLT 222 258 976    Basic Metabolic Panel: Recent Labs  Lab 11/12/21 0158 11/16/21 0821 11/17/21 0908 11/18/21 1137  NA 137 137 139  142  K 4.4 5.3* 4.8 4.8  CL 107 107 109 113*  CO2 23 22 23 23   GLUCOSE 115* 142* 149* 144*  BUN 21 29* 28* 30*  CREATININE 1.21* 1.18* 1.17* 1.10*  CALCIUM 8.5* 8.8* 8.8* 8.6*  MG  --  2.1 2.2 2.1    GFR: Estimated Creatinine Clearance: 29.2 mL/min (A) (by C-G formula based on SCr of 1.1 mg/dL (H)). Liver Function Tests: No results for input(s): AST, ALT, ALKPHOS, BILITOT, PROT, ALBUMIN in the last 168 hours.  No results for input(s): LIPASE, AMYLASE in the last 168 hours. No results for input(s): AMMONIA in the last 168 hours. Coagulation Profile: No results for input(s): INR, PROTIME in the last 168 hours. Cardiac Enzymes: No results for input(s): CKTOTAL, CKMB, CKMBINDEX, TROPONINI in the last 168 hours. BNP (last 3 results) No results for input(s): PROBNP in the last 8760  hours. HbA1C: No results for input(s): HGBA1C in the last 72 hours. CBG: Recent Labs  Lab 11/17/21 1604 11/17/21 2013 11/18/21 0054 11/18/21 0415 11/18/21 1205  GLUCAP 138* 133* 125* 95 125*    Lipid Profile: No results for input(s): CHOL, HDL, LDLCALC, TRIG, CHOLHDL, LDLDIRECT in the last 72 hours. Thyroid Function Tests: No results for input(s): TSH, T4TOTAL, FREET4, T3FREE, THYROIDAB in the last 72 hours. Anemia Panel: No results for input(s): VITAMINB12, FOLATE, FERRITIN, TIBC, IRON, RETICCTPCT in the last 72 hours. Sepsis Labs: Recent Labs  Lab 11/16/21 6759 11/16/21 1059 11/17/21 0908  PROCALCITON  --   --  0.17  LATICACIDVEN 1.4 1.0  --      Recent Results (from the past 240 hour(s))  Culture, blood (routine x 2)     Status: None (Preliminary result)   Collection Time: 11/17/21  1:47 PM   Specimen: BLOOD  Result Value Ref Range Status   Specimen Description BLOOD RIGHT ANTECUBITAL  Final   Special Requests   Final    BOTTLES DRAWN AEROBIC AND ANAEROBIC Blood Culture adequate volume   Culture   Final    NO GROWTH < 24 HOURS Performed at [facility], [address] [place], [place] [address]    Report Status PENDING  Incomplete  Culture, blood (routine x 2)     Status: None (Preliminary result)   Collection Time: 11/17/21  1:48 PM   Specimen: BLOOD  Result Value Ref Range Status   Specimen Description BLOOD BLOOD RIGHT HAND  Final   Special Requests   Final    BOTTLES DRAWN AEROBIC ONLY Blood Culture adequate volume   Culture   Final    NO GROWTH < 24 HOURS Performed at [facility], [place] [address], [place], [place] [address]    Report Status PENDING  Incomplete      Radiology Studies: No results found.  Scheduled Meds:  (feeding supplement) PROSource Plus  30 mL Oral BID BM   aspirin  81 mg Per Tube Daily   atorvastatin  40 mg Per NG tube Daily   dicyclomine  10 mg Per NG tube BID   enoxaparin (LOVENOX) injection  30 mg  Subcutaneous Q24H   feeding supplement  237 mL Oral BID BM   feeding supplement (PROSource TF)  45 mL Per Tube Daily   feeding supplement (VITAL 1.5 CAL)  840 mL Per Tube Q24H   gabapentin  300 mg Per Tube QHS   lipase/protease/amylase  36,000 Units Oral TID AC   mirtazapine  15 mg Per NG tube QHS   multivitamin  with minerals  1 tablet Per Tube Daily   nicotine  14 mg Transdermal Q24H   nystatin  5 mL Mouth/Throat QID   ondansetron  4 mg Oral Q6H   Or   ondansetron (ZOFRAN) IV  4 mg Intravenous Q6H   pantoprazole sodium  40 mg Per Tube Daily   senna-docusate  1 tablet Per NG tube BID   sucralfate  1 g Per NG tube TID WC & HS   verapamil  80 mg Per Tube Q8H   Continuous Infusions:  sodium chloride 75 mL/hr at 11/17/21 1508   ampicillin-sulbactam (UNASYN) IV 3 g (11/18/21 1224)     LOS: 17 days   Time spent: 30 minutes   Darliss Cheney, MD [facility]  11/18/2021, 2:10 PM  Please page via [place] and do not message via secure chat for anything urgent. Secure chat can be used for anything non urgent.  How to contact the [facility] Attending or Consulting provider [place] or covering provider during after hours [place], for this patient?  Check the care team in [facility] and look for a) attending/consulting TR[facility] provider listed and b) the [facility] team listed. Page or secure chat [phone]. Log into [email or web address] and use [place]'s universal password to access. If you do not have the password, please contact the hospital operator. Locate the [facility] provider you are looking for under [facility] and page to a number that you can be directly reached. If you still have difficulty reaching the provider, please page the [facility] (Director on Call) for the Hospitalists listed on amion for assistance.

## 2021-11-19 ENCOUNTER — Inpatient Hospital Stay (HOSPITAL_COMMUNITY): Payer: Medicare Other

## 2021-11-19 DIAGNOSIS — C50912 Malignant neoplasm of unspecified site of left female breast: Secondary | ICD-10-CM

## 2021-11-19 DIAGNOSIS — E785 Hyperlipidemia, unspecified: Secondary | ICD-10-CM

## 2021-11-19 LAB — GLUCOSE, CAPILLARY
Glucose-Capillary: 78 mg/dL (ref 70–99)
Glucose-Capillary: 81 mg/dL (ref 70–99)
Glucose-Capillary: 73 mg/dL (ref 70–99)
Glucose-Capillary: 81 mg/dL (ref 70–99)

## 2021-11-19 MED ORDER — ACETAMINOPHEN 325 MG PO TABS
650.0000 mg | ORAL_TABLET | Freq: Four times a day (QID) | ORAL | Status: DC | PRN
  Administered 2021-11-19 – 2021-11-21 (×2): 650 mg via ORAL
  Filled 2021-11-19 (×3): qty 2

## 2021-11-19 MED ORDER — MIRTAZAPINE 15 MG PO TABS
15.0000 mg | ORAL_TABLET | Freq: Every day | ORAL | Status: DC
  Administered 2021-11-19 – 2021-11-20 (×2): 15 mg via ORAL
  Filled 2021-11-19 (×2): qty 1

## 2021-11-19 MED ORDER — DICYCLOMINE HCL 10 MG PO CAPS
10.0000 mg | ORAL_CAPSULE | Freq: Two times a day (BID) | ORAL | Status: DC
  Administered 2021-11-19 – 2021-11-21 (×4): 10 mg via ORAL
  Filled 2021-11-19 (×5): qty 1

## 2021-11-19 MED ORDER — SENNOSIDES-DOCUSATE SODIUM 8.6-50 MG PO TABS
1.0000 | ORAL_TABLET | Freq: Two times a day (BID) | ORAL | Status: DC
  Administered 2021-11-20 – 2021-11-21 (×2): 1 via ORAL
  Filled 2021-11-19 (×4): qty 1

## 2021-11-19 MED ORDER — OXYCODONE HCL 5 MG PO TABS
5.0000 mg | ORAL_TABLET | ORAL | Status: DC | PRN
  Administered 2021-11-20: 5 mg via ORAL
  Filled 2021-11-19: qty 1

## 2021-11-19 MED ORDER — ADULT MULTIVITAMIN W/MINERALS CH
1.0000 | ORAL_TABLET | Freq: Every day | ORAL | Status: DC
Start: 2021-11-20 — End: 2021-11-21
  Administered 2021-11-20 – 2021-11-21 (×2): 1 via ORAL
  Filled 2021-11-19 (×3): qty 1

## 2021-11-19 MED ORDER — SUCRALFATE 1 G PO TABS
1.0000 g | ORAL_TABLET | Freq: Three times a day (TID) | ORAL | Status: DC
  Administered 2021-11-19 – 2021-11-21 (×7): 1 g via ORAL
  Filled 2021-11-19 (×7): qty 1

## 2021-11-19 MED ORDER — VERAPAMIL HCL 80 MG PO TABS
80.0000 mg | ORAL_TABLET | Freq: Three times a day (TID) | ORAL | Status: DC
  Administered 2021-11-19 – 2021-11-21 (×6): 80 mg via ORAL
  Filled 2021-11-19 (×7): qty 1

## 2021-11-19 MED ORDER — POLYETHYLENE GLYCOL 3350 17 G PO PACK: 17.0000 g | PACK | Freq: Every day | ORAL | Status: DC | PRN

## 2021-11-19 MED ORDER — GABAPENTIN 300 MG PO CAPS
300.0000 mg | ORAL_CAPSULE | Freq: Every day | ORAL | Status: DC
  Administered 2021-11-19 – 2021-11-20 (×2): 300 mg via ORAL
  Filled 2021-11-19 (×2): qty 1

## 2021-11-19 MED ORDER — ACETAMINOPHEN 650 MG RE SUPP: 650.0000 mg | Freq: Four times a day (QID) | RECTAL | Status: DC | PRN

## 2021-11-19 MED ORDER — ATORVASTATIN CALCIUM 40 MG PO TABS
40.0000 mg | ORAL_TABLET | Freq: Every day | ORAL | Status: DC
  Administered 2021-11-20 – 2021-11-21 (×2): 40 mg via ORAL
  Filled 2021-11-19 (×2): qty 1

## 2021-11-19 MED ORDER — PANTOPRAZOLE 2 MG/ML SUSPENSION
40.0000 mg | Freq: Every day | ORAL | Status: DC
  Administered 2021-11-20 – 2021-11-21 (×2): 40 mg via ORAL
  Filled 2021-11-19 (×2): qty 20

## 2021-11-19 NOTE — Progress Notes (Signed)
Mobility Specialist Progress Note   11/19/21 1551  Mobility  Activity Sat and stood x 3  Level of Assistance Minimal assist, patient does 75% or more  Assistive Device Front wheel walker  Mobility Response Tolerated well  Mobility performed by Mobility specialist  $Mobility charge 1 Mobility   Pt presented today very lethargic c/o of tiredness but agreeable to mobility. Mod I for bed mobility but min A for STS x3. X2 seated breaks w/ signs of DOE but Sp02 remained >94% on 2L/min via [place]. Returned back to bed w/ call bell by side and all needs met    Holland Falling Mobility Specialist Phone Number [phone]

## 2021-11-19 NOTE — Progress Notes (Signed)
Noted lethargy- obtained stat glucose resulted 78- not been medicated for pain nor any benzo type medications. Report passed on to day shift charge nurse. Whom also evaluate pt

## 2021-11-19 NOTE — Progress Notes (Signed)
IR was requested for image guided G tube placement.   Case was reviewed by Dr. Annamaria Boots, anatomy amendable for percutaneous G tube placement.  Patient is on ASA 81 mg, last given on Tuesday 12/13.  ASA requires 3 day hold for high bleeding risk procedure such as G tube placement.  Today's does held on [facility].  Lovenox will need to he held x 24 hours prior to the procedure.  No recent INR in chart - ordered to be obtained tomorrow morning.   The G tube placement is tentatively scheduled for this Friday pending INR and IR schedule.  Ordering provider notified via secure chat.   Formal consult to follow.  Please call IR for questions and concerns.   Armando Gang Huldah Marin PA-C 11/19/2021 8:48 AM

## 2021-11-19 NOTE — Progress Notes (Signed)
PROGRESS NOTE    Linda [name]  SWF:[id number] DOB: [birth year] DOA: 10/31/2021 PCP: Sonia Side., FNP   Brief Narrative:  85 year old female with remote history of breast cancer, hypertension, dyslipidemia, peripheral vascular disease, questionable left renal mass came to ED with complaints of lower abdominal pain and discomfort for 4 to 5 weeks.  Worse after eating, poor p.o. intake and ongoing weight loss.  In the ED she was found to have elevated lipase 378, creatinine 1.7 CT abdomen showed left upper pole renal mass larger than 2020 and a cystic lesion in the pancreatic tail.  Gastroenterology was consulted, CT abdomen was being considered to rule out mesenteric ischemia which was deferred due to acute kidney injury.  Endoscopy on 11/30 was unrevealing. Did not tolerate MRI, even after 3 attempts.    Assessment and plan  Acute on chronic pancreatitis, failure to thrive poor p.o. intake and intractable abdominal pain -Patient followed by GI, appreciate insight and recommendations -Multiple imaging show abnormal low-attenuation region at the tail of the pancreas concerning for chronic sequelae versus pseudocyst but cannot exclude neoplasm, MRI has been failed x3 now.  GI planning outpatient endoscopic ultrasound to further evaluate this region -Lengthy discussion with daughter over the phone today, we discussed that a PEG tube would not solve patient's postprandial pain which is likely related to her chronic pancreatitis.  As such patient continues to have pain post NG tube feeds here consistent with what we we discussed.  Daughter was under the impression that a PEG tube would divert what ever was causing her mother pain which is certainly not the case.  As such PEG tube placement has been discontinued -Continue low-fat diet, pancreatic enzyme supplementation to alleviate ongoing pain -As below per goals of care we will attempt 48 hours of increased p.o. intake and follow clinically, if  patient cannot tolerate p.o. appropriately will discuss palliative care  Goals of care Severe protein caloric malnutrition -Lengthy discussion with patient's daughter today at bedside and over the phone -48 hours of increased p.o. intake after NG removal, patient is not a candidate for PEG tube as this will not remedy her chronic postprandial pain likely secondary to chronic pancreatitis versus possible neoplastic process -We will involve palliative care in the next 48 hours pending patient's p.o. tolerance and advancement in diet.   Sepsis And acute hypoxic respiratory failure secondary to aspiration pneumonia, resolving: -Continue to wean oxygen as tolerated -Continue Unasyn for 5-day course   Left renal mass, incidental finding:  Larger as compared to the imaging in 2020; Ultrasound remarkable for bilateral renal cysts.   AKI without history of CKD, improving  Essential hypertension: Controlled on verapamil. Hold Cozaar and hydralazine.       DVT prophylaxis: enoxaparin (LOVENOX) injection 30 mg Start: 11/06/21 1700 Code Status: DNR  Family Communication:  None present at bedside.  Had a lengthy discussion with the daughter over the phone as above in regards to patient's goals of care and NG versus PEG tube nutrition and discontinuation  Status is: Inpatient  Dispo: The patient is from: Home              Anticipated d/c is to: To be determined              Anticipated d/c date is: 48 to 72 hours              Patient currently not medically stable for discharge  Consultants:  GI, vascular surgery, interventional radiology  Procedures:  None  Antimicrobials:  Unasyn x5 days  Subjective: No acute issues or events overnight  Objective: Vitals:   11/18/21 1623 11/18/21 2112 11/18/21 2335 11/19/21 0535  BP: (!) 198/79 (!) 149/67 (!) 166/72 120/65  Pulse: (!) 110 (!) 112 (!) 105 (!) 110  Resp: 20 19 20 17   Temp: 98.2 F (36.8 C)     TempSrc: Oral     SpO2: 100% 99% 99%    Weight:      Height:        Intake/Output Summary (Last 24 hours) at 11/19/2021 0735 Last data filed at 11/18/2021 1042 Gross per 24 hour  Intake 240 ml  Output --  Net 240 ml   Filed Weights   11/15/21 0419 11/16/21 0500 11/17/21 0410  Weight: 55.1 kg 55 kg 53.9 kg    Examination:  General:  Pleasantly resting in bed, No acute distress.  Cachectic in appearance HEENT:  Normocephalic atraumatic.  Sclerae nonicteric, noninjected.  Extraocular movements intact bilaterally.  Core track left nare clean dry intact Neck:  Without mass or deformity.  Trachea is midline. Lungs:  Clear to auscultate bilaterally without rhonchi, wheeze, or rales. Heart:  Regular rate and rhythm.  Without murmurs, rubs, or gallops. Abdomen:  Soft, nontender, nondistended.  Without guarding or rebound. Extremities: Without cyanosis, clubbing, edema, or obvious deformity. Vascular:  Dorsalis pedis and posterior tibial pulses palpable bilaterally. Skin:  Warm and dry, no erythema, no ulcerations.  Data Reviewed: I have personally reviewed following labs and imaging studies  CBC: Recent Labs  Lab 11/16/21 0821 11/17/21 0908 11/18/21 1137  WBC 10.3 13.4* 12.8*  NEUTROABS 7.7 10.3* 10.6*  HGB 8.7* 8.8* 8.4*  HCT 27.9* 29.5* 26.9*  MCV 84.3 87.3 84.6  PLT 222 258 160   Basic Metabolic Panel: Recent Labs  Lab 11/16/21 0821 11/17/21 0908 11/18/21 1137  NA 137 139 142  K 5.3* 4.8 4.8  CL 107 109 113*  CO2 22 23 23   GLUCOSE 142* 149* 144*  BUN 29* 28* 30*  CREATININE 1.18* 1.17* 1.10*  CALCIUM 8.8* 8.8* 8.6*  MG 2.1 2.2 2.1   GFR: Estimated Creatinine Clearance: 29.2 mL/min (A) (by C-G formula based on SCr of 1.1 mg/dL (H)). Liver Function Tests: No results for input(s): AST, ALT, ALKPHOS, BILITOT, PROT, ALBUMIN in the last 168 hours. No results for input(s): LIPASE, AMYLASE in the last 168 hours. No results for input(s): AMMONIA in the last 168 hours. Coagulation Profile: No results for  input(s): INR, PROTIME in the last 168 hours. Cardiac Enzymes: No results for input(s): CKTOTAL, CKMB, CKMBINDEX, TROPONINI in the last 168 hours. BNP (last 3 results) No results for input(s): PROBNP in the last 8760 hours. HbA1C: No results for input(s): HGBA1C in the last 72 hours. CBG: Recent Labs  Lab 11/18/21 1205 11/18/21 1703 11/18/21 2107 11/18/21 2334 11/19/21 0706  GLUCAP 125* 136* 79 86 78   Lipid Profile: No results for input(s): CHOL, HDL, LDLCALC, TRIG, CHOLHDL, LDLDIRECT in the last 72 hours. Thyroid Function Tests: No results for input(s): TSH, T4TOTAL, FREET4, T3FREE, THYROIDAB in the last 72 hours. Anemia Panel: No results for input(s): VITAMINB12, FOLATE, FERRITIN, TIBC, IRON, RETICCTPCT in the last 72 hours. Sepsis Labs: Recent Labs  Lab 11/16/21 0821 11/16/21 1059 11/17/21 0908  PROCALCITON  --   --  0.17  LATICACIDVEN 1.4 1.0  --     Recent Results (from the past 240 hour(s))  Culture, blood (routine x 2)     Status: None (Preliminary  result)   Collection Time: 11/17/21  1:47 PM   Specimen: BLOOD  Result Value Ref Range Status   Specimen Description BLOOD RIGHT ANTECUBITAL  Final   Special Requests   Final    BOTTLES DRAWN AEROBIC AND ANAEROBIC Blood Culture adequate volume   Culture   Final    NO GROWTH < 24 HOURS Performed at [facility], [address], [place], [place] [address]    Report Status PENDING  Incomplete  Culture, blood (routine x 2)     Status: None (Preliminary result)   Collection Time: 11/17/21  1:48 PM   Specimen: BLOOD  Result Value Ref Range Status   Specimen Description BLOOD BLOOD RIGHT HAND  Final   Special Requests   Final    BOTTLES DRAWN AEROBIC ONLY Blood Culture adequate volume   Culture   Final    NO GROWTH < 24 HOURS Performed at [facility], [place] [address] [place], [place] [address]    Report Status PENDING  Incomplete         Radiology Studies: DG CHEST PORT 1 VIEW  Result Date:  11/18/2021 CLINICAL DATA:  Hypoxia EXAM: PORTABLE CHEST 1 VIEW COMPARISON:  11/16/2021, 06/19/2019 CT 12/26/2003 FINDINGS: Esophageal tube tip below the diaphragm but incompletely visualized. Small left-sided pleural effusion with airspace disease at left base. Probable tiny right pleural effusion. Cardiomegaly with vascular congestion. No visible pneumothorax. Aortic atherosclerosis IMPRESSION: 1. Cardiomegaly with vascular congestion 2. Small left greater than right pleural effusions. Airspace disease at left lung base may reflect atelectasis or pneumonia Electronically Signed   By: Donavan Foil M.D.   On: 11/18/2021 18:50        Scheduled Meds:  (feeding supplement) PROSource Plus  30 mL Oral BID BM   aspirin  81 mg Per Tube Daily   atorvastatin  40 mg Per NG tube Daily   dicyclomine  10 mg Per NG tube BID   enoxaparin (LOVENOX) injection  30 mg Subcutaneous Q24H   feeding supplement  237 mL Oral BID BM   feeding supplement (PROSource TF)  45 mL Per Tube Daily   feeding supplement (VITAL 1.5 CAL)  840 mL Per Tube Q24H   gabapentin  300 mg Per Tube QHS   lipase/protease/amylase  36,000 Units Oral TID AC   mirtazapine  15 mg Per NG tube QHS   multivitamin with minerals  1 tablet Per Tube Daily   nicotine  14 mg Transdermal Q24H   nystatin  5 mL Mouth/Throat QID   ondansetron  4 mg Oral Q6H   Or   ondansetron (ZOFRAN) IV  4 mg Intravenous Q6H   pantoprazole sodium  40 mg Per Tube Daily   senna-docusate  1 tablet Per NG tube BID   sucralfate  1 g Per NG tube TID WC & HS   verapamil  80 mg Per Tube Q8H   Continuous Infusions:  ampicillin-sulbactam (UNASYN) IV 3 g (11/19/21 0008)     LOS: 18 days   Time spent: 10min  Angelise Petrich C Tonjia Parillo, DO [facility]  If 7PM-7AM, please contact night-coverage [email or web address]  11/19/2021, 7:35 AM

## 2021-11-19 NOTE — Plan of Care (Signed)
°  Problem: Education: Goal: Knowledge of General Education information will improve Description: Including pain rating scale, medication(s)/side effects and non-pharmacologic comfort measures Outcome: Not Progressing   Problem: Health Behavior/Discharge Planning: Goal: Ability to manage health-related needs will improve Outcome: Not Progressing   Problem: Clinical Measurements: Goal: Ability to maintain clinical measurements within normal limits will improve Outcome: Not Progressing Goal: Will remain free from infection Outcome: Not Progressing Goal: Diagnostic test results will improve Outcome: Not Progressing Goal: Respiratory complications will improve Outcome: Not Progressing Goal: Cardiovascular complication will be avoided Outcome: Not Progressing   Problem: Clinical Measurements: Goal: Will remain free from infection Outcome: Not Progressing   Problem: Clinical Measurements: Goal: Respiratory complications will improve Outcome: Not Progressing   Problem: Activity: Goal: Risk for activity intolerance will decrease Outcome: Not Progressing   Problem: Nutrition: Goal: Adequate nutrition will be maintained Outcome: Not Progressing   Problem: Elimination: Goal: Will not experience complications related to bowel motility Outcome: Not Progressing Goal: Will not experience complications related to urinary retention Outcome: Not Progressing   Problem: Safety: Goal: Ability to remain free from injury will improve Outcome: Not Progressing   Problem: Skin Integrity: Goal: Risk for impaired skin integrity will decrease Outcome: Not Progressing

## 2021-11-19 NOTE — Progress Notes (Signed)
Return to green on  MEWS- notiified on call for [facility] remains plugged up.

## 2021-11-20 LAB — GLUCOSE, CAPILLARY
Glucose-Capillary: 103 mg/dL — ABNORMAL HIGH (ref 70–99)
Glucose-Capillary: 103 mg/dL — ABNORMAL HIGH (ref 70–99)
Glucose-Capillary: 81 mg/dL (ref 70–99)
Glucose-Capillary: 86 mg/dL (ref 70–99)
Glucose-Capillary: 92 mg/dL (ref 70–99)
Glucose-Capillary: 95 mg/dL (ref 70–99)

## 2021-11-20 LAB — BASIC METABOLIC PANEL
Anion gap: 6 (ref 5–15)
BUN: 23 mg/dL (ref 8–23)
CO2: 27 mmol/L (ref 22–32)
Calcium: 9 mg/dL (ref 8.9–10.3)
Chloride: 113 mmol/L — ABNORMAL HIGH (ref 98–111)
Creatinine, Ser: 1.14 mg/dL — ABNORMAL HIGH (ref 0.44–1.00)
GFR, Estimated: 48 mL/min — ABNORMAL LOW (ref >60)
Glucose, Bld: 89 mg/dL (ref 70–99)
Potassium: 4.6 mmol/L (ref 3.5–5.1)
Sodium: 146 mmol/L — ABNORMAL HIGH (ref 135–145)

## 2021-11-20 LAB — CBC
HCT: 24.8 % — ABNORMAL LOW (ref 36.0–46.0)
Hemoglobin: 7.5 g/dL — ABNORMAL LOW (ref 12.0–15.0)
MCH: 25.5 pg — ABNORMAL LOW (ref 26.0–34.0)
MCHC: 30.2 g/dL (ref 30.0–36.0)
MCV: 84.4 fL (ref 80.0–100.0)
Platelets: 295 10*3/uL (ref 150–400)
RBC: 2.94 MIL/uL — ABNORMAL LOW (ref 3.87–5.11)
RDW: 15.6 % — ABNORMAL HIGH (ref 11.5–15.5)
WBC: 9.4 10*3/uL (ref 4.0–10.5)
nRBC: 0 % (ref 0.0–0.2)

## 2021-11-20 LAB — PROTIME-INR
INR: 1.2 (ref 0.8–1.2)
Prothrombin Time: 15 seconds (ref 11.4–15.2)

## 2021-11-20 MED ORDER — BOOST / RESOURCE BREEZE PO LIQD CUSTOM
1.0000 | Freq: Three times a day (TID) | ORAL | Status: DC
  Administered 2021-11-20 – 2021-11-21 (×2): 1 via ORAL

## 2021-11-20 MED ORDER — ORAL CARE MOUTH RINSE
15.0000 mL | Freq: Two times a day (BID) | OROMUCOSAL | Status: DC
  Administered 2021-11-20 – 2021-11-21 (×3): 15 mL via OROMUCOSAL

## 2021-11-20 MED ORDER — PROSOURCE PLUS PO LIQD
30.0000 mL | Freq: Three times a day (TID) | ORAL | Status: DC
  Administered 2021-11-21 (×2): 30 mL via ORAL
  Filled 2021-11-20 (×3): qty 30

## 2021-11-20 NOTE — Progress Notes (Signed)
Nutrition Follow-up  DOCUMENTATION CODES:   Non-severe (moderate) malnutrition in context of chronic illness  INTERVENTION:  -Continue regular diet  -d/c Ensure -Boost Breeze po TID, each supplement provides 250 kcal and 9 grams of protein -PROSource PLUS PO 77mls TID, each supplement provides 100 kcals and 15 grams of protein -MVI with minerals daily  NUTRITION DIAGNOSIS:   Moderate Malnutrition related to chronic illness (PAD) as evidenced by mild fat depletion, moderate muscle depletion.  ongoing  GOAL:   Patient will meet greater than or equal to 90% of their needs  progressing  MONITOR:   Diet advancement, PO intake, Supplement acceptance, Labs, I & O's  REASON FOR ASSESSMENT:   Consult Enteral/tube feeding initiation and management  ASSESSMENT:   85 yo female with a PMH of  breast cancer, hypertension, dyslipidemia, peripheral vascular disease, ? L renal mass, who presented to the ED with lower abdominal pain and discomfort for a few weeks. Admitted with abdominal pain.  12/02 - cortrak placed 12/08 - diet advanced to regular   MD notes cortrak was removed and RD observed pt without Cortrak, but tube is still listed in pt's LDAs. Discussed pt with RN who is unsure of when cortrak was removed, but thinks it was possibly last night. Will update LDAs to reflect that pt does not have feeding tube.   Pt still endorsing postprandial abdominal pain, but reports improved intake since Cortrak was removed. Per MD, "Given patient's tolerance of advancing diet we will continue to hold off on further discussion about hospice or palliative care, assuming she can continue to improve her nutritional status and continue to maintain appropriate hydration will discharge home likely tomorrow.  We discussed that in the future if her p.o. status continues to worsen in the setting of abdominal pain due to chronic pancreatitis PEG tube would not be a curative procedure and would likely need  to discuss palliative care again at that time." Will continue to provide pt with oral nutrition supplements and information for improving nutrition status after discharge.  Per RN, pt doing well with Prosource but is refusing Ensure.   PO Intake: 0-80% x 5 recorded meals  (20% avg meal intake)  UOP: 462ml x24 hours I/O: +10,364ml since admit  Medications: Scheduled Meds:  (feeding supplement) PROSource Plus  30 mL Oral TID BM   atorvastatin  40 mg Oral Daily   dicyclomine  10 mg Oral BID   enoxaparin (LOVENOX) injection  30 mg Subcutaneous Q24H   feeding supplement  1 Container Oral TID BM   gabapentin  300 mg Oral QHS   lipase/protease/amylase  36,000 Units Oral TID AC   mouth rinse  15 mL Mouth Rinse BID   mirtazapine  15 mg Oral QHS   multivitamin with minerals  1 tablet Oral Daily   nicotine  14 mg Transdermal Q24H   nystatin  5 mL Mouth/Throat QID   ondansetron  4 mg Oral Q6H   Or   ondansetron (ZOFRAN) IV  4 mg Intravenous Q6H   pantoprazole sodium  40 mg Oral Daily   senna-docusate  1 tablet Oral BID   sucralfate  1 g Oral TID WC & HS   verapamil  80 mg Oral Q8H  Continuous Infusions:  ampicillin-sulbactam (UNASYN) IV 3 g (11/20/21 1352)   Labs: Recent Labs  Lab 11/16/21 0821 11/17/21 0908 11/18/21 1137 11/20/21 0600  NA 137 139 142 146*  K 5.3* 4.8 4.8 4.6  CL 107 109 113* 113*  CO2 22  23 23 27   BUN 29* 28* 30* 23  CREATININE 1.18* 1.17* 1.10* 1.14*  CALCIUM 8.8* 8.8* 8.6* 9.0  MG 2.1 2.2 2.1  --   GLUCOSE 142* 149* 144* 89  CBGs: 73-103 x24 hours    Diet Order:   Diet Order             Diet regular Room service appropriate? Yes with Assist; Fluid consistency: Thin  Diet effective now                   EDUCATION NEEDS:   Education needs have been addressed  Skin:  Skin Assessment: Reviewed RN Assessment  Last BM:  12/14  Height:   Ht Readings from Last 1 Encounters:  10/31/21 5\' 1"  (1.549 m)    Weight:   Wt Readings from Last 1  Encounters:  11/20/21 56.9 kg    BMI:  Body mass index is 23.7 kg/m.  Estimated Nutritional Needs:   Kcal:  1650-1850  Protein:  85-100 grams  Fluid:  >1.65 L     Theone Stanley., MS, RD, LDN (she/her/hers) RD pager number and weekend/on-call pager number located in [employer].

## 2021-11-20 NOTE — Progress Notes (Signed)
PROGRESS NOTE    Linda [name]  QAS:[id number] DOB: 1936-02-15 DOA: 10/31/2021 PCP: Sonia Side., FNP   Brief Narrative:  85 year old female with remote history of breast cancer, hypertension, dyslipidemia, peripheral vascular disease, questionable left renal mass came to ED with complaints of lower abdominal pain and discomfort for 4 to 5 weeks.  Worse after eating, poor p.o. intake and ongoing weight loss.  In the ED she was found to have elevated lipase 378, creatinine 1.7 CT abdomen showed left upper pole renal mass larger than 2020 and a cystic lesion in the pancreatic tail. Gastroenterology was consulted, CT abdomen was being considered to rule out mesenteric ischemia which was deferred due to acute kidney injury. Endoscopy on 11/30 was unrevealing. Did not tolerate MRI, even after 3 attempts.  Assessment and plan  Acute on chronic pancreatitis, failure to thrive poor p.o. intake and intractable abdominal pain - Patient followed by GI, appreciate insight and recommendations - Multiple imaging show abnormal low-attenuation region at the tail of the pancreas concerning for chronic sequelae versus pseudocyst but cannot exclude neoplasm, MRI has been failed x3 now.  GI planning outpatient endoscopic ultrasound to further evaluate this region - Lengthy discussion with daughter over the phone today, we discussed that a PEG tube would not solve patient's postprandial pain which is likely related to her chronic pancreatitis.  As such patient continues to have pain post NG tube feeds here consistent with what we we discussed.  Daughter was under the impression that a PEG tube would divert what ever was causing her mother pain which is certainly not the case.  As such PEG tube placement has been discontinued - Continue low-fat diet, pancreatic enzyme supplementation to alleviate ongoing pain -Patient tolerating p.o. more appropriately now with NG tube out, no postprandial pain cramping or issues  reported over the past 24 hours  Goals of care Severe protein caloric malnutrition - Lengthy discussion with patient's daughter today at over the phone -Given patient's tolerance of advancing diet we will continue to hold off on further discussion about hospice or palliative care, assuming she can continue to improve her nutritional status and continue to maintain appropriate hydration will discharge home likely tomorrow.  We discussed that in the future if her p.o. status continues to worsen in the setting of abdominal pain due to chronic pancreatitis PEG tube would not be a curative procedure and would likely need to discuss palliative care again at that time.   Sepsis with acute hypoxic respiratory failure secondary to aspiration pneumonia versus pneumonitis, resolving: - Continue to wean oxygen as tolerated - Continue Unasyn for 5-day course, stop date 11/21/2021   Left renal mass, incidental finding:  - Larger as compared to the imaging in 2020; Ultrasound remarkable for bilateral renal cysts.   AKI without history of CKD, stabilizing - Essential hypertension: Controlled on verapamil. Hold Cozaar and hydralazine.     DVT prophylaxis: Enoxaparin  Code Status: DNR  Family Communication:  None present at bedside.  Lengthy discussion with daughter over the phone, tentative plan for disposition tomorrow given improved p.o. intake  Status is: Inpatient  Dispo: The patient is from: Home              Anticipated d/c is to: Home              Anticipated d/c date is: 48 to 72 hours              Patient currently not medically stable for  discharge  Consultants:  GI, vascular surgery, interventional radiology  Procedures:  None  Antimicrobials:  Unasyn x5 days stop date 11/21/2021  Subjective: No acute issues or events overnight tolerating p.o. quite well overnight and this morning without any residual nausea vomiting abdominal pain.  Objective: Vitals:   11/20/21 0059 11/20/21  0500 11/20/21 0547 11/20/21 0712  BP: (!) 151/64  (!) 160/64 122/73  Pulse: 88  91 83  Resp: 18  18 16   Temp: 98.7 F (37.1 C)   99 F (37.2 C)  TempSrc: Oral  Oral Oral  SpO2: 94%  95% 96%  Weight:  56.9 kg    Height:        Intake/Output Summary (Last 24 hours) at 11/20/2021 0744 Last data filed at 11/20/2021 0046 Gross per 24 hour  Intake 240 ml  Output 400 ml  Net -160 ml    Filed Weights   11/16/21 0500 11/17/21 0410 11/20/21 0500  Weight: 55 kg 53.9 kg 56.9 kg    Examination:  General:  Pleasantly resting in bed, No acute distress.  Cachectic in appearance HEENT:  Normocephalic atraumatic.  Sclerae nonicteric, noninjected.  Extraocular movements intact bilaterally.  Core track left nare clean dry intact Neck:  Without mass or deformity.  Trachea is midline. Lungs:  Clear to auscultate bilaterally without rhonchi, wheeze, or rales. Heart:  Regular rate and rhythm.  Without murmurs, rubs, or gallops. Abdomen:  Soft, nontender, nondistended.  Without guarding or rebound. Extremities: Without cyanosis, clubbing, edema, or obvious deformity. Vascular:  Dorsalis pedis and posterior tibial pulses palpable bilaterally. Skin:  Warm and dry, no erythema, no ulcerations.  Data Reviewed: I have personally reviewed following labs and imaging studies  CBC: Recent Labs  Lab 11/16/21 0821 11/17/21 0908 11/18/21 1137 11/20/21 0600  WBC 10.3 13.4* 12.8* 9.4  NEUTROABS 7.7 10.3* 10.6*  --   HGB 8.7* 8.8* 8.4* 7.5*  HCT 27.9* 29.5* 26.9* 24.8*  MCV 84.3 87.3 84.6 84.4  PLT 222 258 262 378    Basic Metabolic Panel: Recent Labs  Lab 11/16/21 0821 11/17/21 0908 11/18/21 1137 11/20/21 0600  NA 137 139 142 146*  K 5.3* 4.8 4.8 4.6  CL 107 109 113* 113*  CO2 22 23 23 27   GLUCOSE 142* 149* 144* 89  BUN 29* 28* 30* 23  CREATININE 1.18* 1.17* 1.10* 1.14*  CALCIUM 8.8* 8.8* 8.6* 9.0  MG 2.1 2.2 2.1  --     GFR: Estimated Creatinine Clearance: 28.2 mL/min (A) (by C-G  formula based on SCr of 1.14 mg/dL (H)). Liver Function Tests: No results for input(s): AST, ALT, ALKPHOS, BILITOT, PROT, ALBUMIN in the last 168 hours. No results for input(s): LIPASE, AMYLASE in the last 168 hours. No results for input(s): AMMONIA in the last 168 hours. Coagulation Profile: Recent Labs  Lab 11/20/21 0600  INR 1.2   Cardiac Enzymes: No results for input(s): CKTOTAL, CKMB, CKMBINDEX, TROPONINI in the last 168 hours. BNP (last 3 results) No results for input(s): PROBNP in the last 8760 hours. HbA1C: No results for input(s): HGBA1C in the last 72 hours. CBG: Recent Labs  Lab 11/19/21 0830 11/19/21 1229 11/19/21 2100 11/20/21 0009 11/20/21 0422  GLUCAP 81 73 81 95 81    Lipid Profile: No results for input(s): CHOL, HDL, LDLCALC, TRIG, CHOLHDL, LDLDIRECT in the last 72 hours. Thyroid Function Tests: No results for input(s): TSH, T4TOTAL, FREET4, T3FREE, THYROIDAB in the last 72 hours. Anemia Panel: No results for input(s): VITAMINB12, FOLATE, FERRITIN, TIBC, IRON,  RETICCTPCT in the last 72 hours. Sepsis Labs: Recent Labs  Lab 11/16/21 5681 11/16/21 1059 11/17/21 0908  PROCALCITON  --   --  0.17  LATICACIDVEN 1.4 1.0  --      Recent Results (from the past 240 hour(s))  Culture, blood (routine x 2)     Status: None (Preliminary result)   Collection Time: 11/17/21  1:47 PM   Specimen: BLOOD  Result Value Ref Range Status   Specimen Description BLOOD RIGHT ANTECUBITAL  Final   Special Requests   Final    BOTTLES DRAWN AEROBIC AND ANAEROBIC Blood Culture adequate volume   Culture   Final    NO GROWTH 3 DAYS Performed at [facility], [address], [place], [place] [address]    Report Status PENDING  Incomplete  Culture, blood (routine x 2)     Status: None (Preliminary result)   Collection Time: 11/17/21  1:48 PM   Specimen: BLOOD  Result Value Ref Range Status   Specimen Description BLOOD BLOOD RIGHT HAND  Final   Special Requests    Final    BOTTLES DRAWN AEROBIC ONLY Blood Culture adequate volume   Culture   Final    NO GROWTH 3 DAYS Performed at [facility], [place] [address], [place], [place] [address]    Report Status PENDING  Incomplete          Radiology Studies: CT ABDOMEN WO CONTRAST  Result Date: 11/19/2021 CLINICAL DATA:  Preop planning, gastrostomy placement EXAM: CT ABDOMEN WITHOUT CONTRAST TECHNIQUE: Multidetector CT imaging of the abdomen was performed following the standard protocol without IV contrast. COMPARISON:  11/07/2021 and previous FINDINGS: Lower chest: Small pleural effusions. Consolidation/atelectasis posterolaterally in the lingula and left lower lobe, new since previous. Coronary calcifications. Hepatobiliary: Gallbladder not visualized. No focal liver lesion or biliary ductal dilatation. Pancreas: Unremarkable. No pancreatic ductal dilatation or surrounding inflammatory changes. Spleen: Normal in size without focal abnormality. Adrenals/Urinary Tract: Adrenal glands unremarkable. 2.4 cm hyperdense lesion from the upper pole left kidney, stable since 10/31/2021 but nonspecific. Bilateral renal cystic lesions as before. No definite urolithiasis or hydronephrosis. Stomach/Bowel: Feeding tube extends to the gastric antrum. The stomach is nondistended. There is a safe percutaneous window for gastrostomy placement. Visualized small bowel and colon are nondistended. Scattered colonic diverticula noted. Vascular/Lymphatic: Extensive calcified aortoiliac atheromatous plaque without aneurysm. No abdominal or mesenteric adenopathy. Other: No abdominal ascites.  No free air. Musculoskeletal: Mild lumbar dextroscoliosis apex L4 with multilevel spondylitic change. No fracture or worrisome bone lesion. IMPRESSION: 1. There exists a safe percutaneous window for gastrostomy placement. No significant abdominal ascites or other contraindication. 2. Coronary and Aortic Atherosclerosis (ICD10-170.0). 3. Small  pleural effusions with new consolidation/atelectasis at the left lung base. 4. Hyperdense exophytic 2.4 cm left upper pole renal lesion, may represent benign hemorrhagic cyst or neoplasm. If clinically relevant, elective outpatient MR kidney with contrast may be useful for further characterization. 5. Colonic diverticulosis. Electronically Signed   By: Lucrezia Europe M.D.   On: 11/19/2021 11:28   DG CHEST PORT 1 VIEW  Result Date: 11/18/2021 CLINICAL DATA:  Hypoxia EXAM: PORTABLE CHEST 1 VIEW COMPARISON:  11/16/2021, 06/19/2019 CT 12/26/2003 FINDINGS: Esophageal tube tip below the diaphragm but incompletely visualized. Small left-sided pleural effusion with airspace disease at left base. Probable tiny right pleural effusion. Cardiomegaly with vascular congestion. No visible pneumothorax. Aortic atherosclerosis IMPRESSION: 1. Cardiomegaly with vascular congestion 2. Small left greater than right pleural effusions. Airspace disease at left lung  base may reflect atelectasis or pneumonia Electronically Signed   By: Donavan Foil M.D.   On: 11/18/2021 18:50        Scheduled Meds:  (feeding supplement) PROSource Plus  30 mL Oral BID BM   atorvastatin  40 mg Oral Daily   dicyclomine  10 mg Oral BID   enoxaparin (LOVENOX) injection  30 mg Subcutaneous Q24H   feeding supplement  237 mL Oral BID BM   gabapentin  300 mg Oral QHS   lipase/protease/amylase  36,000 Units Oral TID AC   mouth rinse  15 mL Mouth Rinse BID   mirtazapine  15 mg Oral QHS   multivitamin with minerals  1 tablet Oral Daily   nicotine  14 mg Transdermal Q24H   nystatin  5 mL Mouth/Throat QID   ondansetron  4 mg Oral Q6H   Or   ondansetron (ZOFRAN) IV  4 mg Intravenous Q6H   pantoprazole sodium  40 mg Oral Daily   senna-docusate  1 tablet Oral BID   sucralfate  1 g Oral TID WC & HS   verapamil  80 mg Oral Q8H   Continuous Infusions:  ampicillin-sulbactam (UNASYN) IV 3 g (11/20/21 0046)     LOS: 19 days   Time spent:  27min  Jniya Madara C Fahmida Jurich, DO [facility]  If 7PM-7AM, please contact night-coverage [email or web address]  11/20/2021, 7:44 AM

## 2021-11-20 NOTE — Plan of Care (Signed)

## 2021-11-21 DIAGNOSIS — Z515 Encounter for palliative care: Secondary | ICD-10-CM

## 2021-11-21 LAB — GLUCOSE, CAPILLARY
Glucose-Capillary: 80 mg/dL (ref 70–99)
Glucose-Capillary: 82 mg/dL (ref 70–99)
Glucose-Capillary: 85 mg/dL (ref 70–99)
Glucose-Capillary: 95 mg/dL (ref 70–99)

## 2021-11-21 MED ORDER — SUCRALFATE 1 G PO TABS: 1.0000 g | ORAL_TABLET | Freq: Three times a day (TID) | ORAL | 1 refills | Status: AC

## 2021-11-21 MED ORDER — DICYCLOMINE HCL 10 MG PO CAPS: 10.0000 mg | ORAL_CAPSULE | Freq: Two times a day (BID) | ORAL | 1 refills | Status: AC

## 2021-11-21 MED ORDER — OXYCODONE HCL 5 MG PO TABS: 5.0000 mg | ORAL_TABLET | ORAL | 0 refills | Status: AC | PRN

## 2021-11-21 MED ORDER — SENNOSIDES-DOCUSATE SODIUM 8.6-50 MG PO TABS: 1.0000 | ORAL_TABLET | Freq: Two times a day (BID) | ORAL | 0 refills | Status: AC

## 2021-11-21 MED ORDER — PANCRELIPASE (LIP-PROT-AMYL) 36000-114000 UNITS PO CPEP: 36000.0000 [IU] | ORAL_CAPSULE | Freq: Three times a day (TID) | ORAL | 1 refills | Status: AC

## 2021-11-21 MED ORDER — POLYETHYLENE GLYCOL 3350 17 G PO PACK: 17.0000 g | PACK | Freq: Every day | ORAL | 0 refills | Status: AC | PRN

## 2021-11-21 MED ORDER — PROSOURCE PLUS PO LIQD: 30.0000 mL | Freq: Three times a day (TID) | ORAL | 1 refills | Status: AC

## 2021-11-21 NOTE — Plan of Care (Signed)

## 2021-11-21 NOTE — Discharge Summary (Signed)
Physician Discharge Summary  [name] MBW:[id number] DOB: 10-20-1936 DOA: 10/31/2021  PCP: Sonia Side., FNP  Admit date: 10/31/2021 Discharge date: 11/21/2021  Admitted From: home Disposition:  home  Recommendations for Outpatient Follow-up:  Follow up with PCP in 1-2 weeks Please obtain BMP/CBC in one week  Home Health: PT OT nursing social worker and aide Equipment/Devices: Oxygen 2 L  Discharge Condition: Stable CODE STATUS: DNR Diet recommendation: Low-salt low-fat diet  Brief/Interim Summary: 85 year old female with remote history of breast cancer, hypertension, dyslipidemia, peripheral vascular disease, questionable left renal mass came to ED with complaints of lower abdominal pain and discomfort for 4 to 5 weeks.  Worse after eating, poor p.o. intake and ongoing weight loss.  In the ED she was found to have elevated lipase 378, creatinine 1.7 CT abdomen showed left upper pole renal mass larger than 2020 and a cystic lesion in the pancreatic tail. Gastroenterology was consulted, CT abdomen was being considered to rule out mesenteric ischemia which was deferred due to acute kidney injury. Endoscopy on 11/30 was unrevealing. Did not tolerate MRI, even after 3 attempts.   Assessment and plan   Acute on chronic pancreatitis, failure to thrive poor p.o. intake and intractable abdominal pain - Patient followed by GI, appreciate insight and recommendations - Multiple imaging show abnormal low-attenuation region at the tail of the pancreas concerning for chronic sequelae versus pseudocyst but cannot exclude neoplasm, MRI has been failed x3 now.  GI planning outpatient endoscopic ultrasound to further evaluate this region - Continue low-fat diet, pancreatic enzyme supplementation to alleviate ongoing pain -Patient tolerating p.o. more appropriately now with NG tube out, no postprandial pain cramping or issues reported over the past 48 hours, continue to follow-up with GI as  indicated   Goals of care Severe protein caloric malnutrition - Lengthy discussion with patient's daughter daily   Sepsis with acute hypoxic respiratory failure secondary to aspiration pneumonia versus pneumonitis, resolving: - Continue to wean oxygen as tolerated -Completed Unasyn for 5-day course   Left renal mass, incidental finding:  - Larger as compared to the imaging in 2020; Ultrasound remarkable for bilateral renal cysts.   AKI without history of CKD, stabilizing - Essential hypertension: Controlled on verapamil. Hold Cozaar and hydralazine.    Discharge Instructions   Allergies as of 11/21/2021   No Known Allergies      Medication List     STOP taking these medications    hydrALAZINE 50 MG tablet Commonly known as: APRESOLINE   losartan 25 MG tablet Commonly known as: COZAAR       TAKE these medications    (feeding supplement) PROSource Plus liquid Take 30 mLs by mouth 3 (three) times daily between meals.   albuterol 108 (90 Base) MCG/ACT inhaler Commonly known as: VENTOLIN HFA Inhale 1 puff into the lungs every 6 (six) hours as needed.   aspirin EC 81 MG tablet Take 1 tablet (81 mg total) by mouth daily.   atorvastatin 40 MG tablet Commonly known as: LIPITOR TAKE 1 TABLET BY MOUTH EACH EVENING What changed: See the new instructions.   Dexilant 60 MG capsule Generic drug: dexlansoprazole Take 1qd (Plz sched appt with new provider) What changed:  how much to take how to take this when to take this additional instructions   dicyclomine 10 MG capsule Commonly known as: BENTYL Take 1 capsule (10 mg total) by mouth 2 (two) times daily.   gabapentin 300 MG capsule Commonly known as: NEURONTIN TAKE 1 CAPSULE  BY MOUTH EVERY DAY. No further refills. Schedule with Dustin Folks for follow up. What changed:  how much to take how to take this when to take this   lipase/protease/amylase 36000 UNITS Cpep capsule Commonly known as: CREON Take 1  capsule (36,000 Units total) by mouth 3 (three) times daily before meals.   mirtazapine 15 MG tablet Commonly known as: REMERON Take 15 mg by mouth at bedtime.   ondansetron 4 MG tablet Commonly known as: ZOFRAN Take 4 mg by mouth every 8 (eight) hours as needed.   oxyCODONE 5 MG immediate release tablet Commonly known as: Oxy IR/ROXICODONE Take 1 tablet (5 mg total) by mouth every 4 (four) hours as needed for moderate pain.   polyethylene glycol 17 g packet Commonly known as: MIRALAX / GLYCOLAX Take 17 g by mouth daily as needed for mild constipation.   senna-docusate 8.6-50 MG tablet Commonly known as: Senokot-S Take 1 tablet by mouth 2 (two) times daily.   sucralfate 1 g tablet Commonly known as: CARAFATE Take 1 tablet (1 g total) by mouth 4 (four) times daily -  with meals and at bedtime.   verapamil 120 MG CR tablet Commonly known as: CALAN-SR Take 240 mg by mouth in the morning.               Durable Medical Equipment  (From admission, onward)           Start     Ordered   11/10/21 1508  For home use only DME 3 n 1  Once        11/10/21 1507   11/10/21 1507  For home use only DME Walker rolling  Once       Question Answer Comment  Walker: With 5 Inch Wheels   Patient needs a walker to treat with the following condition Weakness      11/10/21 [place]., FNP Follow up.   Specialty: Family Medicine Contact information: [place] [address] [phone]         Jerline Pain, MD .   Specialty: Cardiology Contact information: [phone] [place] [address] [phone]                No Known Allergies  Consultations: GI, vascular, interventional radiology   Procedures/Studies: CT ABDOMEN PELVIS WO CONTRAST  Result Date: 10/31/2021 CLINICAL DATA:  85 year old female with abdominal pain. EXAM: CT ABDOMEN AND PELVIS WITHOUT CONTRAST  TECHNIQUE: Multidetector CT imaging of the abdomen and pelvis was performed following the standard protocol without IV contrast. COMPARISON:  06/15/2019 FINDINGS: Lower chest: No acute abnormality. Hepatobiliary: No focal liver abnormality is seen. Status post cholecystectomy. No biliary dilatation. Pancreas: Similar appearing tubular cystic lesion about the pancreatic tail measuring up to approximately 1.3 cm, similar to comparison. No new pancreatic ductal dilation or focal mass. Spleen: Normal in size without focal abnormality. Adrenals/Urinary Tract: Similar appearing mild left adrenal gland thickening without discrete nodularity. Hip interval enlargement of previously described exophytic left solid renal mass arising from the posterior upper pole which measures up to 2.5 cm, previously 1.9 cm. Similar appearance of scattered simple cysts about the kidneys bilaterally. No hydronephrosis. Bladder is unremarkable. Stomach/Bowel: Stomach is within normal limits. Appendix appears normal. Pan colonic diverticula without surrounding inflammatory changes. No evidence of bowel wall thickening, distention, or inflammatory changes. Vascular/Lymphatic: Aortic atherosclerosis. No  enlarged abdominal or pelvic lymph nodes. Reproductive: Status post hysterectomy. No adnexal masses. Other: Similar appearing small fat containing umbilical hernia. No abdominopelvic ascites. Musculoskeletal: No acute osseous abnormality. Similar appearing multilevel degenerative changes of the visualized thoracolumbar spine. IMPRESSION: 1. No acute abdominopelvic abnormality. 2. Interval enlargement of previously described left upper pole exophytic solid mass from 1.9 cm to 2.5 cm since 2020. This remains suspicious for primary renal neoplasm. Further evaluation with contrast-enhanced ultrasound would be reasonable given intolerance to intravenous contrast agents. 3. Diverticulosis without evidence of diverticulitis. 4. Indeterminate similar  appearing slightly decreased size of ovoid cystic lesion in the pancreatic tail, statistically favored represent a benign IPMN. Electronically Signed   By: Ruthann Cancer M.D.   On: 10/31/2021 14:34   CT ABDOMEN WO CONTRAST  Result Date: 11/19/2021 CLINICAL DATA:  Preop planning, gastrostomy placement EXAM: CT ABDOMEN WITHOUT CONTRAST TECHNIQUE: Multidetector CT imaging of the abdomen was performed following the standard protocol without IV contrast. COMPARISON:  11/07/2021 and previous FINDINGS: Lower chest: Small pleural effusions. Consolidation/atelectasis posterolaterally in the lingula and left lower lobe, new since previous. Coronary calcifications. Hepatobiliary: Gallbladder not visualized. No focal liver lesion or biliary ductal dilatation. Pancreas: Unremarkable. No pancreatic ductal dilatation or surrounding inflammatory changes. Spleen: Normal in size without focal abnormality. Adrenals/Urinary Tract: Adrenal glands unremarkable. 2.4 cm hyperdense lesion from the upper pole left kidney, stable since 10/31/2021 but nonspecific. Bilateral renal cystic lesions as before. No definite urolithiasis or hydronephrosis. Stomach/Bowel: Feeding tube extends to the gastric antrum. The stomach is nondistended. There is a safe percutaneous window for gastrostomy placement. Visualized small bowel and colon are nondistended. Scattered colonic diverticula noted. Vascular/Lymphatic: Extensive calcified aortoiliac atheromatous plaque without aneurysm. No abdominal or mesenteric adenopathy. Other: No abdominal ascites.  No free air. Musculoskeletal: Mild lumbar dextroscoliosis apex L4 with multilevel spondylitic change. No fracture or worrisome bone lesion. IMPRESSION: 1. There exists a safe percutaneous window for gastrostomy placement. No significant abdominal ascites or other contraindication. 2. Coronary and Aortic Atherosclerosis (ICD10-170.0). 3. Small pleural effusions with new consolidation/atelectasis at the  left lung base. 4. Hyperdense exophytic 2.4 cm left upper pole renal lesion, may represent benign hemorrhagic cyst or neoplasm. If clinically relevant, elective outpatient MR kidney with contrast may be useful for further characterization. 5. Colonic diverticulosis. Electronically Signed   By: Lucrezia Europe M.D.   On: 11/19/2021 11:28   US RENAL  Result Date: 11/10/2021 CLINICAL DATA:  Left renal mass. EXAM: RENAL / URINARY TRACT ULTRASOUND COMPLETE COMPARISON:  11/07/2021. FINDINGS: Right Kidney: Renal measurements: 10.1 x 4.5 x 5.2 cm = volume: 124.93 mL. Increased parenchymal echogenicity. Multiple cysts are identified, the largest simple cyst in the upper pole measuring 2.2 x 1.9 x 2.1 cm. No mass or hydronephrosis visualized. Left Kidney: Renal measurements: 10.5 x 5.0 x 5.5 cm = volume: 150.08 mL. Echogenicity within normal limits. Multiple simple cysts are identified, the largest in the upper pole measuring 5.2 x 4.3 x 4.1 cm. Bladder: Appears normal for degree of bladder distention. Other: None. IMPRESSION: 1. Bilateral renal cysts.  No mass is identified. 2. Increased renal echogenicity on the right, may be associated with medical renal disease. Electronically Signed   By: Brett Fairy M.D.   On: 11/10/2021 20:35   DG CHEST PORT 1 VIEW  Result Date: 11/18/2021 CLINICAL DATA:  Hypoxia EXAM: PORTABLE CHEST 1 VIEW COMPARISON:  11/16/2021, 06/19/2019 CT 12/26/2003 FINDINGS: Esophageal tube tip below the diaphragm but incompletely visualized. Small left-sided pleural effusion with airspace disease at  left base. Probable tiny right pleural effusion. Cardiomegaly with vascular congestion. No visible pneumothorax. Aortic atherosclerosis IMPRESSION: 1. Cardiomegaly with vascular congestion 2. Small left greater than right pleural effusions. Airspace disease at left lung base may reflect atelectasis or pneumonia Electronically Signed   By: Donavan Foil M.D.   On: 11/18/2021 18:50   DG CHEST PORT 1  VIEW  Result Date: 11/16/2021 CLINICAL DATA:  Sudden onset shortness of breath EXAM: PORTABLE CHEST 1 VIEW COMPARISON:  06/19/2019 FINDINGS: Single frontal view of the chest demonstrates enteric catheter passing below diaphragm tip excluded by collimation. The cardiac silhouette is stable. There is chronic interstitial scarring, with trace left pleural effusion and patchy left basilar consolidation. No pneumothorax. No acute bony abnormalities. IMPRESSION: 1. Patchy left basilar consolidation and trace left pleural effusion, which could reflect pneumonia or aspiration. Electronically Signed   By: Randa Ngo M.D.   On: 11/16/2021 15:23   DG Abd Portable 1V  Result Date: 11/07/2021 CLINICAL DATA:  Feeding tube placement. EXAM: PORTABLE ABDOMEN - 1 VIEW COMPARISON:  None. FINDINGS: The bowel gas pattern is normal. Distal tip of feeding tube is seen in expected position of distal stomach. No radio-opaque calculi or other significant radiographic abnormality are seen. IMPRESSION: Distal tip of feeding tube is seen in expected position of distal stomach. Electronically Signed   By: Marijo Conception M.D.   On: 11/07/2021 10:21   VAS [place] MESENTERIC  Result Date: 11/05/2021 ABDOMINAL VISCERAL Patient Name:  Linda [name]  Date of Exam:   11/05/2021 Medical Rec #: [id number]       Accession #:    [id number] Date of Birth: 1936/02/08        Patient Gender: F Patient Age:   85 years Exam Location:  [facility] Procedure:      VAS [place] MESENTERIC Referring Phys: Pound -------------------------------------------------------------------------------- Indications: Weight loss, abdominal pain Limitations: Air/bowel gas. Comparison Study: No prior studies. Performing Technologist: Darlin Coco RDMS, RVT  Examination Guidelines: A complete evaluation includes B-mode imaging, spectral Doppler, color Doppler, and power Doppler as needed of all accessible portions of each vessel. Bilateral testing is  considered an integral part of a complete examination. Limited examinations for reoccurring indications may be performed as noted.  Duplex Findings: +----------------------+--------+--------+------+--------+  Mesenteric             PSV cm/s EDV cm/s Plaque Comments  +----------------------+--------+--------+------+--------+  Aorta Mid                 48                              +----------------------+--------+--------+------+--------+  Celiac Artery Origin     228                              +----------------------+--------+--------+------+--------+  Celiac Artery Proximal   211                              +----------------------+--------+--------+------+--------+  SMA Origin               173                              +----------------------+--------+--------+------+--------+  SMA Proximal  104                              +----------------------+--------+--------+------+--------+  SMA Mid                  131                              +----------------------+--------+--------+------+--------+  SMA Distal                85                              +----------------------+--------+--------+------+--------+  IMA                       55                              +----------------------+--------+--------+------+--------+    Summary: Mesenteric:  Mildly elevated velocities observed in the celiac axis. Patent SMA and IMA.  *See table(s) above for measurements and observations.  Diagnosing physician: Jamelle Haring  Electronically signed by Jamelle Haring on 11/05/2021 at 4:04:13 PM.    Final    CT Angio Abd/Pel w/ and/or w/o  Result Date: 11/07/2021 CLINICAL DATA:  Abdominal pain, aortic dissection suspected. EXAM: CTA ABDOMEN AND PELVIS WITHOUT AND WITH CONTRAST TECHNIQUE: Multidetector CT imaging of the abdomen and pelvis was performed using the standard protocol during bolus administration of intravenous contrast. Multiplanar reconstructed images and MIPs were obtained and reviewed to  evaluate the vascular anatomy. CONTRAST:  86mL OMNIPAQUE IOHEXOL 350 MG/ML SOLN COMPARISON:  CT abdomen/pelvis 10/31/2021 FINDINGS: VASCULAR Aorta: Normal in caliber. No evidence of aneurysm or dissection. Extensive mixed calcified and fibrofatty atherosclerotic plaque throughout the abdominal aorta. Celiac: Patent without evidence of aneurysm, dissection, vasculitis or significant stenosis. SMA: Patent without evidence of aneurysm, dissection, vasculitis or significant stenosis. Renals: Solitary renal arteries bilaterally. Calcified atherosclerotic plaque results in mild stenosis in the proximal renal arteries bilaterally. No aneurysm, dissection or evidence of fibromuscular dysplasia. IMA: Diminutive.  Suspect proximal stenosis. Inflow: No evidence of aneurysm, dissection, or occlusion. Extensive atherosclerotic plaque bilaterally. Probable focal significant stenosis in the distal left common iliac artery Proximal Outflow: Extensive atherosclerotic plaque with severe bilateral SFA disease likely occluded on the right and near occluded on the left. Veins: No focal venous abnormality. Review of the MIP images confirms the above findings. NON-VASCULAR Lower chest: No acute abnormality. Hepatobiliary: No focal liver abnormality is seen. Status post cholecystectomy. No biliary dilatation. Pancreas: Abnormal appearance of the pancreas with what appears to be multifocal segmental dilations of the main pancreatic duct in the tail and body measuring up to 5-6 mm. Additionally, there is an ill-defined rounded low-attenuation lesion in the mid pancreas measuring approximately 1.6 by 1.1 cm. Additionally, the pancreatic tail and body are slightly ill-defined and there is subtle inflammatory stranding in the peripancreatic fat. These findings suggest pancreatitis. Spleen: Normal in size without focal abnormality. Adrenals/Urinary Tract: No hydronephrosis. Multifocal low-attenuation renal lesions most consistent with simple  cysts. Following administration of intravenous contrast, the intermediate density lesion exophytic from the upper pole of the left kidney demonstrates no convincing evidence of enhancement compared to the recent prior noncontrast enhanced CT scan. This is most consistent with a hemorrhagic or proteinaceous cyst. The  ureters and bladder are unremarkable. Stomach/Bowel: Colonic diverticular disease without CT evidence of active inflammation. No evidence of obstruction or focal bowel wall thickening. Normal appendix in the right lower quadrant. The terminal ileum is unremarkable. Feeding tube tip lies in the gastric antrum. Lymphatic: No suspicious lymphadenopathy. Reproductive: Status post hysterectomy. No adnexal masses. Other: No abdominal wall hernia or abnormality. No abdominopelvic ascites. Musculoskeletal: No acute fracture or aggressive appearing lytic or blastic osseous lesion. Extensive bilateral lumbar facet arthropathy and multilevel degenerative disc disease. IMPRESSION: VASCULAR 1. No evidence of acute arterial occlusion, dissection or thrombosis. 2. No significant stenosis involving the visceral or renal arteries. 3. Extensive atherosclerotic plaque involving the aorta, branch vessels and lower extremity arteries. High-grade stenosis versus occlusion of the superficial femoral arteries bilaterally are likely chronic. NON-VASCULAR 1. Suspect acute pancreatitis with ill-defined edematous pancreatic tail and proximal body associated with inflammatory stranding in the peripancreatic fat. 2. Irregular appearance of the pancreatic duct with apparent areas of segmental dilation and a questionable 1.6 cm low-attenuation lesion in the mid pancreas. Findings may reflect sequelae of chronic pancreatitis and a developing pancreatic pseudocyst. Pancreatic neoplasm is difficult to exclude entirely. After resolution of the patient's acute episode, a pancreas protocol MRI of the abdomen could be considered for further  evaluation. 3. The enlarging intermediate density lesion exophytic from the upper pole of the left kidney demonstrates no evidence of enhancement on today's exam suggesting that this represents a proteinaceous or hemorrhagic cyst rather than a renal neoplasm. 4. The tip of the enteric feeding tube is within the gastric antrum. Recommend advancing if the intent is for transpyloric feeds. 5. Additional ancillary findings as above without significant interval change compared to recent prior imaging. Electronically Signed   By: Jacqulynn Cadet M.D.   On: 11/07/2021 13:55     Subjective: No acute issues or events overnight plan for discharge later today otherwise denies nausea vomiting diarrhea constipation headache fevers chills or chest pain   Discharge Exam: Vitals:   11/20/21 2107 11/21/21 0550  BP: (!) 194/77 (!) 153/73  Pulse: 90 95  Resp: 18 20  Temp: 98.4 F (36.9 C) 98 F (36.7 C)  SpO2: 92% 92%   Vitals:   11/20/21 0712 11/20/21 1515 11/20/21 2107 11/21/21 0550  BP: 122/73 (!) 151/67 (!) 194/77 (!) 153/73  Pulse: 83 83 90 95  Resp: 16 17 18 20   Temp: 99 F (37.2 C) 98.8 F (37.1 C) 98.4 F (36.9 C) 98 F (36.7 C)  TempSrc: Oral Oral Oral Oral  SpO2: 96% 94% 92% 92%  Weight:    65.4 kg  Height:        General: Pt is alert, awake, not in acute distress Cardiovascular: RRR, S1/S2 +, no rubs, no gallops Respiratory: CTA bilaterally, no wheezing, no rhonchi Abdominal: Soft, NT, ND, bowel sounds + Extremities: no edema, no cyanosis    The results of significant diagnostics from this hospitalization (including imaging, microbiology, ancillary and laboratory) are listed below for reference.     Microbiology: Recent Results (from the past 240 hour(s))  Culture, blood (routine x 2)     Status: None (Preliminary result)   Collection Time: 11/17/21  1:47 PM   Specimen: BLOOD  Result Value Ref Range Status   Specimen Description BLOOD RIGHT ANTECUBITAL  Final   Special  Requests   Final    BOTTLES DRAWN AEROBIC AND ANAEROBIC Blood Culture adequate volume   Culture   Final    NO GROWTH 4 DAYS Performed  at [facility], [place] [address] [place], [place] [address]    Report Status PENDING  Incomplete  Culture, blood (routine x 2)     Status: None (Preliminary result)   Collection Time: 11/17/21  1:48 PM   Specimen: BLOOD  Result Value Ref Range Status   Specimen Description BLOOD BLOOD RIGHT HAND  Final   Special Requests   Final    BOTTLES DRAWN AEROBIC ONLY Blood Culture adequate volume   Culture   Final    NO GROWTH 4 DAYS Performed at [facility], [place] [address], [place], [place] [address]    Report Status PENDING  Incomplete     Labs: BNP (last 3 results) No results for input(s): BNP in the last 8760 hours. Basic Metabolic Panel: Recent Labs  Lab 11/16/21 0821 11/17/21 0908 11/18/21 1137 11/20/21 0600  NA 137 139 142 146*  K 5.3* 4.8 4.8 4.6  CL 107 109 113* 113*  CO2 22 23 23 27   GLUCOSE 142* 149* 144* 89  BUN 29* 28* 30* 23  CREATININE 1.18* 1.17* 1.10* 1.14*  CALCIUM 8.8* 8.8* 8.6* 9.0  MG 2.1 2.2 2.1  --    Liver Function Tests: No results for input(s): AST, ALT, ALKPHOS, BILITOT, PROT, ALBUMIN in the last 168 hours. No results for input(s): LIPASE, AMYLASE in the last 168 hours. No results for input(s): AMMONIA in the last 168 hours. CBC: Recent Labs  Lab 11/16/21 0821 11/17/21 0908 11/18/21 1137 11/20/21 0600  WBC 10.3 13.4* 12.8* 9.4  NEUTROABS 7.7 10.3* 10.6*  --   HGB 8.7* 8.8* 8.4* 7.5*  HCT 27.9* 29.5* 26.9* 24.8*  MCV 84.3 87.3 84.6 84.4  PLT 222 258 262 295   Cardiac Enzymes: No results for input(s): CKTOTAL, CKMB, CKMBINDEX, TROPONINI in the last 168 hours. BNP: Invalid input(s): POCBNP CBG: Recent Labs  Lab 11/20/21 1116 11/20/21 1606 11/20/21 2026 11/20/21 2359 11/21/21 0434  GLUCAP 103* 103* 92 82 80   D-Dimer No results for input(s): DDIMER in the last 72 hours. Hgb A1c No  results for input(s): HGBA1C in the last 72 hours. Lipid Profile No results for input(s): CHOL, HDL, LDLCALC, TRIG, CHOLHDL, LDLDIRECT in the last 72 hours. Thyroid function studies No results for input(s): TSH, T4TOTAL, T3FREE, THYROIDAB in the last 72 hours.  Invalid input(s): FREET3 Anemia work up No results for input(s): VITAMINB12, FOLATE, FERRITIN, TIBC, IRON, RETICCTPCT in the last 72 hours. Urinalysis    Component Value Date/Time   COLORURINE YELLOW 06/15/2019 1310   APPEARANCEUR CLEAR 06/15/2019 1310   LABSPEC 1.013 06/15/2019 1310   PHURINE 5.0 06/15/2019 1310   GLUCOSEU NEGATIVE 06/15/2019 1310   HGBUR MODERATE (A) 06/15/2019 1310   BILIRUBINUR NEGATIVE 06/15/2019 1310   BILIRUBINUR NEGATIVE 06/14/2019 1232   KETONESUR NEGATIVE 06/15/2019 1310   PROTEINUR 100 (A) 06/15/2019 1310   UROBILINOGEN 0.2 06/14/2019 1232   UROBILINOGEN 1.0 09/27/2014 2044   NITRITE NEGATIVE 06/15/2019 1310   LEUKOCYTESUR LARGE (A) 06/15/2019 1310   Sepsis Labs Invalid input(s): PROCALCITONIN,  WBC,  [facility] Microbiology Recent Results (from the past 240 hour(s))  Culture, blood (routine x 2)     Status: None (Preliminary result)   Collection Time: 11/17/21  1:47 PM   Specimen: BLOOD  Result Value Ref Range Status   Specimen Description BLOOD RIGHT ANTECUBITAL  Final   Special Requests   Final    BOTTLES DRAWN AEROBIC AND ANAEROBIC Blood Culture adequate volume   Culture   Final  NO GROWTH 4 DAYS Performed at [facility], [place] [address], [place], [place] [address]    Report Status PENDING  Incomplete  Culture, blood (routine x 2)     Status: None (Preliminary result)   Collection Time: 11/17/21  1:48 PM   Specimen: BLOOD  Result Value Ref Range Status   Specimen Description BLOOD BLOOD RIGHT HAND  Final   Special Requests   Final    BOTTLES DRAWN AEROBIC ONLY Blood Culture adequate volume   Culture   Final    NO GROWTH 4 DAYS Performed at [facility],  [place] [address], [place], [place] [address]    Report Status PENDING  Incomplete     Time coordinating discharge: Over 30 minutes  SIGNED:   Little Ishikawa, DO [facility] 11/21/2021, 7:46 AM Pager   If 7PM-7AM, please contact night-coverage [email or web address]

## 2021-11-21 NOTE — TOC Transition Note (Addendum)
Transition of Care [facility]) - CM/SW Discharge Note   Patient Details  Name: Linda [name] MRN: [id number] Date of Birth: 07-13-1936  Transition [facility]) CM/SW Contact:  Sharin Mons, RN Phone Number: 11/21/2021, 10:30 AM   Clinical Narrative:    Patient will DC to: home  Anticipated DC date: 11/21/2021 Family notified: yes Transport by: car   Per MD patient ready for DC today. Declined SNF placement .RN, patient, and patient's daughter notified of DC. Pt without DME NEEDS, already has RW, BSC. Pt agreeable to home health services. Choice offered . Pt without preference. Referral made with [facility] and accepted. Pt without Rx med concerns. Daughter to provide transportation to home.   [name] (Daughter)       [phone]      RNCM will sign off for now as intervention is no longer needed. Please consult [place] again if new needs arise.    Final next level of care: [place] Barriers to Discharge: No Barriers Identified   Patient Goals and CMS Choice     Choice offered to / list presented to : Patient  Discharge Placement                       Discharge Plan and Services   Discharge Planning Services: CM Consult            DME Arranged: 3-N-1, Walker rolling DME Agency: [employer] Date DME Agency Contacted: 11/10/21 Time DME Agency Contacted: 0802 Representative spoke with at DME Agency: Freda Munro HH Arranged: OT, PT, Nurse's Aide, Social Work [employer] Agency: [place] Date [place]: 11/21/21 Time [place]: 1028 Representative spoke with at [place]: [place] ([place]) Interventions     Readmission Risk Interventions No flowsheet data found.

## 2021-11-21 NOTE — Progress Notes (Addendum)
SATURATION QUALIFICATIONS: (This note is used to comply with regulatory documentation for home oxygen)  Patient Saturations on Room Air at Rest = 91%  Patient Saturations on Room Air while Ambulating = 67%  Patient Saturations on 2 Liters of oxygen while Ambulating = 92%  Please briefly explain why patient needs home oxygen: desaturation and shortness of breath with ambulation on room air

## 2021-11-21 NOTE — Progress Notes (Signed)
NCM made aware of oxygen need, order noted. Pt aware of need and agreeable.Referral made with [facility] [phone]) for home oxygen. Equipment will be delivered to the bedside prior to d/c .  NCM made bedside nurse aware. Whitman Hero RN,BSN,CM

## 2021-11-21 NOTE — Progress Notes (Signed)
Physical Therapy Treatment Patient Details Name: Linda [name] MRN: [id number] DOB: 1936-08-20 Today's Date: 11/21/2021   History of Present Illness Pt is an 85 y.o. female admitted 11/25 for abdominal pain. Found to have acute on chronic pancreatitis, sepsis and aspiration PNA. PMH: remote breast cancer; HTN; HLD, and PVD    PT Comments    Treatment limited today secondary to pt just amb with mobility specialist prior to PT arrival.  According to mobility note, able to mobilize 15 ft with CGA only.  Pt is fatigued from activity and agreeable to therex in chair only.  Able to complete with AA and is educated on completing exercises throughout day to increase LE strength.  Demos understanding.  Pt reports wanting to return home on D/C, but unable to formally update plan secondary to pt not amb with PT today.  Based on information gathered in chart, [facility] does support with [facility] PT.  Recommendations for follow up therapy are one component of a multi-disciplinary discharge planning process, led by the attending physician.  Recommendations may be updated based on patient status, additional functional criteria and insurance authorization.  Follow Up Recommendations  Other (comment) (Pt wants HH PT but did not amb with PT today for PT to make informed decision re: D/C plan.  Was able to amb CGA with mobility specialist.)     Assistance Recommended at Discharge Intermittent Supervision/Assistance  Equipment Recommendations  Rolling walker (2 wheels)    Recommendations for Other Services       Precautions / Restrictions Precautions Precautions: Fall Restrictions Weight Bearing Restrictions: No     Mobility  Bed Mobility                    Transfers                        Ambulation/Gait                   Stairs             Wheelchair Mobility    Modified Rankin (Stroke Patients Only)       Balance                                             Cognition Arousal/Alertness: Awake/alert Behavior During Therapy: WFL for tasks assessed/performed Overall Cognitive Status: Within Functional Limits for tasks assessed                                 General Comments: Pt. is sitting up in chair when PT arrives.  States she just walked with mobility specialist and does not want to walk again at this time.  Agreeable to therex in chair.  Pt states her legs felt good when she walked earlier and her only concern with return home is her O2.        Exercises General Exercises - Lower Extremity Ankle Circles/Pumps: AROM;Both;20 reps Heel Slides: AAROM;Both;20 reps Hip ABduction/ADduction: AAROM;Both;20 reps Straight Leg Raises: AAROM;Both;20 reps    General Comments        Pertinent Vitals/Pain Pain Assessment: 0-10 Pain Score: 0-No pain    Home Living  Prior Function            PT Goals (current goals can now be found in the care plan section) Progress towards PT goals: Progressing toward goals    Frequency    Min 2X/week      PT Plan Other (comment) (Per CM notes, pt does not want SNF, wants to return home.  Was able to amb with CGA short distance with mobility specialist today, but did not amb with PT.  Based on mobility assessment, should be able to transition home with support.)    Co-evaluation              [facility] PT "6 Clicks" Mobility   Outcome Measure  Help needed turning from your back to your side while in a flat bed without using bedrails?: A Little Help needed moving from lying on your back to sitting on the side of a flat bed without using bedrails?: A Little Help needed moving to and from a bed to a chair (including a wheelchair)?: A Little Help needed standing up from a chair using your arms (e.g., wheelchair or bedside chair)?: A Little Help needed to walk in hospital room?: A Little Help needed climbing 3-5 steps with a railing?  : A Little 6 Click Score: 18    End of Session   Activity Tolerance: Patient tolerated treatment well Patient left: in chair;with call bell/phone within reach;with chair alarm set         Time: 1152-1207 PT Time Calculation (min) (ACUTE ONLY): 15 min  Charges:  $Therapeutic Exercise: 8-22 mins                     Linda [name], PT, DPT Acute Rehabilitation Services Office: [place] 11/21/2021, 12:16 PM

## 2021-11-21 NOTE — Progress Notes (Signed)
Mobility Specialist Progress Note   11/21/21 1100  Oxygen Therapy  SpO2 94 %  O2 Device Nasal Cannula  O2 Flow Rate (L/min) 2 L/min  Mobility  Activity Ambulated in room  Level of Assistance Contact guard assist, steadying assist  Assistive Device Front wheel walker  Distance Ambulated (ft) 15 ft  Mobility Ambulated with assistance in room  Mobility Response Tolerated well  Mobility performed by Mobility specialist  $Mobility charge 1 Mobility   Received pt on EOB w/ no complaints and agreeable to mobility. Pt mod I throughout ambulation w/ x1 standing break d/t SOB while on 2L/min via [place], Sp02 >90% . Sat back down to work on pursed lip breathing ~32mn, pt felt more relieved afterwards. Left on EOB w/ call bell by side and all needs met.     JHolland FallingMobility Specialist Phone Number [phone]

## 2021-11-22 LAB — CULTURE, BLOOD (ROUTINE X 2)
Culture: NO GROWTH
Culture: NO GROWTH
Special Requests: ADEQUATE
Special Requests: ADEQUATE

## 2021-12-17 ENCOUNTER — Ambulatory Visit: Payer: Medicare Other | Admitting: Gastroenterology
